# Patient Record
Sex: Male | Born: 1940 | Race: White | Hispanic: No | Smoking: Never smoker
Health system: Southern US, Community
[De-identification: ages and names within clinical notes are randomized; demographics above are authoritative.]

## PROBLEM LIST (undated history)

## (undated) ENCOUNTER — Emergency Department (HOSPITAL_COMMUNITY): Payer: Medicare Other

## (undated) DIAGNOSIS — I4821 Permanent atrial fibrillation: Secondary | ICD-10-CM

## (undated) DIAGNOSIS — R972 Elevated prostate specific antigen [PSA]: Secondary | ICD-10-CM

## (undated) DIAGNOSIS — M1990 Unspecified osteoarthritis, unspecified site: Secondary | ICD-10-CM

## (undated) DIAGNOSIS — C801 Malignant (primary) neoplasm, unspecified: Secondary | ICD-10-CM

## (undated) DIAGNOSIS — Z9889 Other specified postprocedural states: Secondary | ICD-10-CM

## (undated) DIAGNOSIS — K5790 Diverticulosis of intestine, part unspecified, without perforation or abscess without bleeding: Secondary | ICD-10-CM

## (undated) DIAGNOSIS — I10 Essential (primary) hypertension: Secondary | ICD-10-CM

## (undated) DIAGNOSIS — N189 Chronic kidney disease, unspecified: Secondary | ICD-10-CM

## (undated) DIAGNOSIS — R112 Nausea with vomiting, unspecified: Secondary | ICD-10-CM

## (undated) DIAGNOSIS — T8459XA Infection and inflammatory reaction due to other internal joint prosthesis, initial encounter: Secondary | ICD-10-CM

## (undated) DIAGNOSIS — Z96659 Presence of unspecified artificial knee joint: Secondary | ICD-10-CM

## (undated) DIAGNOSIS — Z8719 Personal history of other diseases of the digestive system: Secondary | ICD-10-CM

## (undated) HISTORY — DX: Permanent atrial fibrillation: I48.21

## (undated) HISTORY — DX: Elevated prostate specific antigen (PSA): R97.20

## (undated) HISTORY — DX: Essential (primary) hypertension: I10

## (undated) HISTORY — PX: HIATAL HERNIA REPAIR: SHX195

## (undated) HISTORY — DX: Infection and inflammatory reaction due to other internal joint prosthesis, initial encounter: T84.59XA

## (undated) HISTORY — DX: Diverticulosis of intestine, part unspecified, without perforation or abscess without bleeding: K57.90

## (undated) HISTORY — DX: Malignant (primary) neoplasm, unspecified: C80.1

## (undated) HISTORY — DX: Unspecified osteoarthritis, unspecified site: M19.90

## (undated) HISTORY — DX: Presence of unspecified artificial knee joint: Z96.659

## (undated) HISTORY — PX: HERNIA REPAIR: SHX51

## (undated) HISTORY — DX: Chronic kidney disease, unspecified: N18.9

---

## 2001-04-18 HISTORY — PX: COLONOSCOPY: SHX174

## 2001-12-27 ENCOUNTER — Ambulatory Visit (HOSPITAL_COMMUNITY): Admission: RE | Admit: 2001-12-27 | Discharge: 2001-12-27 | Payer: Self-pay | Admitting: Internal Medicine

## 2001-12-27 ENCOUNTER — Encounter: Payer: Self-pay | Admitting: Internal Medicine

## 2002-01-02 ENCOUNTER — Encounter: Payer: Self-pay | Admitting: Internal Medicine

## 2002-01-02 ENCOUNTER — Ambulatory Visit (HOSPITAL_COMMUNITY): Admission: RE | Admit: 2002-01-02 | Discharge: 2002-01-02 | Payer: Self-pay | Admitting: Internal Medicine

## 2002-01-21 ENCOUNTER — Ambulatory Visit (HOSPITAL_COMMUNITY): Admission: RE | Admit: 2002-01-21 | Discharge: 2002-01-21 | Payer: Self-pay | Admitting: Internal Medicine

## 2002-03-08 ENCOUNTER — Encounter: Payer: Self-pay | Admitting: Surgery

## 2002-03-20 ENCOUNTER — Encounter (INDEPENDENT_AMBULATORY_CARE_PROVIDER_SITE_OTHER): Payer: Self-pay | Admitting: Specialist

## 2002-03-20 ENCOUNTER — Inpatient Hospital Stay (HOSPITAL_COMMUNITY): Admission: RE | Admit: 2002-03-20 | Discharge: 2002-03-29 | Payer: Self-pay | Admitting: Surgery

## 2002-03-21 ENCOUNTER — Encounter: Payer: Self-pay | Admitting: Surgery

## 2002-03-25 ENCOUNTER — Encounter: Payer: Self-pay | Admitting: Cardiology

## 2004-01-23 ENCOUNTER — Encounter: Admission: RE | Admit: 2004-01-23 | Discharge: 2004-01-23 | Payer: Self-pay | Admitting: Orthopaedic Surgery

## 2004-02-09 ENCOUNTER — Encounter: Admission: RE | Admit: 2004-02-09 | Discharge: 2004-02-09 | Payer: Self-pay | Admitting: Orthopaedic Surgery

## 2004-02-23 ENCOUNTER — Encounter: Admission: RE | Admit: 2004-02-23 | Discharge: 2004-02-23 | Payer: Self-pay | Admitting: Orthopaedic Surgery

## 2004-02-23 ENCOUNTER — Ambulatory Visit: Payer: Self-pay | Admitting: Cardiology

## 2004-03-01 ENCOUNTER — Ambulatory Visit: Payer: Self-pay | Admitting: Cardiology

## 2004-03-08 ENCOUNTER — Encounter: Admission: RE | Admit: 2004-03-08 | Discharge: 2004-03-08 | Payer: Self-pay | Admitting: Orthopaedic Surgery

## 2004-03-08 ENCOUNTER — Ambulatory Visit: Payer: Self-pay | Admitting: Internal Medicine

## 2004-03-16 ENCOUNTER — Ambulatory Visit: Payer: Self-pay | Admitting: Cardiology

## 2004-03-31 ENCOUNTER — Ambulatory Visit: Payer: Self-pay | Admitting: Cardiology

## 2004-04-16 ENCOUNTER — Ambulatory Visit: Payer: Self-pay | Admitting: Internal Medicine

## 2004-04-28 ENCOUNTER — Ambulatory Visit: Payer: Self-pay | Admitting: *Deleted

## 2004-05-03 ENCOUNTER — Ambulatory Visit: Payer: Self-pay | Admitting: Internal Medicine

## 2004-05-19 ENCOUNTER — Ambulatory Visit: Payer: Self-pay | Admitting: Cardiology

## 2004-06-16 ENCOUNTER — Ambulatory Visit: Payer: Self-pay | Admitting: Cardiology

## 2004-06-21 ENCOUNTER — Ambulatory Visit: Payer: Self-pay | Admitting: Internal Medicine

## 2004-07-14 ENCOUNTER — Ambulatory Visit: Payer: Self-pay | Admitting: *Deleted

## 2004-08-04 ENCOUNTER — Ambulatory Visit: Payer: Self-pay | Admitting: Internal Medicine

## 2004-09-01 ENCOUNTER — Ambulatory Visit: Payer: Self-pay | Admitting: Cardiology

## 2004-09-15 ENCOUNTER — Ambulatory Visit: Payer: Self-pay | Admitting: Cardiology

## 2004-10-13 ENCOUNTER — Ambulatory Visit: Payer: Self-pay | Admitting: Cardiology

## 2004-10-22 ENCOUNTER — Ambulatory Visit: Payer: Self-pay | Admitting: Cardiology

## 2004-11-10 ENCOUNTER — Ambulatory Visit: Payer: Self-pay | Admitting: Cardiology

## 2004-12-01 ENCOUNTER — Ambulatory Visit: Payer: Self-pay | Admitting: Cardiology

## 2004-12-29 ENCOUNTER — Ambulatory Visit: Payer: Self-pay | Admitting: Cardiology

## 2005-01-26 ENCOUNTER — Ambulatory Visit: Payer: Self-pay | Admitting: Cardiology

## 2005-02-23 ENCOUNTER — Ambulatory Visit: Payer: Self-pay | Admitting: Cardiology

## 2005-03-24 ENCOUNTER — Ambulatory Visit: Payer: Self-pay | Admitting: Cardiology

## 2005-03-31 ENCOUNTER — Ambulatory Visit: Payer: Self-pay | Admitting: Internal Medicine

## 2005-04-18 HISTORY — PX: TOTAL KNEE ARTHROPLASTY: SHX125

## 2005-04-18 HISTORY — PX: TOTAL HIP ARTHROPLASTY: SHX124

## 2005-04-20 ENCOUNTER — Ambulatory Visit: Payer: Self-pay | Admitting: Cardiology

## 2005-05-04 ENCOUNTER — Ambulatory Visit: Payer: Self-pay | Admitting: Cardiology

## 2005-05-16 ENCOUNTER — Ambulatory Visit: Payer: Self-pay | Admitting: Internal Medicine

## 2005-05-20 ENCOUNTER — Encounter: Payer: Self-pay | Admitting: Cardiology

## 2005-05-20 ENCOUNTER — Ambulatory Visit: Payer: Self-pay

## 2005-05-20 ENCOUNTER — Ambulatory Visit: Payer: Self-pay | Admitting: Cardiology

## 2005-05-25 ENCOUNTER — Ambulatory Visit: Payer: Self-pay | Admitting: Cardiology

## 2005-06-02 ENCOUNTER — Ambulatory Visit: Payer: Self-pay | Admitting: Internal Medicine

## 2005-06-03 ENCOUNTER — Ambulatory Visit: Payer: Self-pay | Admitting: Internal Medicine

## 2005-06-20 ENCOUNTER — Ambulatory Visit: Payer: Self-pay | Admitting: Internal Medicine

## 2005-06-22 ENCOUNTER — Ambulatory Visit: Payer: Self-pay | Admitting: Internal Medicine

## 2005-06-24 ENCOUNTER — Ambulatory Visit: Payer: Self-pay | Admitting: Internal Medicine

## 2005-07-20 ENCOUNTER — Ambulatory Visit: Payer: Self-pay | Admitting: Cardiology

## 2005-08-10 ENCOUNTER — Ambulatory Visit: Payer: Self-pay | Admitting: *Deleted

## 2005-09-14 ENCOUNTER — Ambulatory Visit: Payer: Self-pay | Admitting: Cardiovascular Disease

## 2005-09-28 ENCOUNTER — Ambulatory Visit: Payer: Self-pay | Admitting: Cardiovascular Disease

## 2005-10-26 ENCOUNTER — Ambulatory Visit: Payer: Self-pay | Admitting: Internal Medicine

## 2005-11-24 ENCOUNTER — Ambulatory Visit: Payer: Self-pay | Admitting: Cardiology

## 2005-12-15 ENCOUNTER — Ambulatory Visit: Payer: Self-pay | Admitting: Cardiology

## 2006-01-12 ENCOUNTER — Ambulatory Visit: Payer: Self-pay | Admitting: Cardiology

## 2006-02-09 ENCOUNTER — Ambulatory Visit: Payer: Self-pay | Admitting: Cardiology

## 2006-02-23 ENCOUNTER — Ambulatory Visit: Payer: Self-pay | Admitting: Internal Medicine

## 2006-02-23 LAB — CONVERTED CEMR LAB
BUN: 27 mg/dL — ABNORMAL HIGH (ref 6–23)
Creatinine, Ser: 1.6 mg/dL — ABNORMAL HIGH (ref 0.4–1.5)
Potassium: 4.4 meq/L (ref 3.5–5.1)
Uric Acid, Serum: 7.6 mg/dL — ABNORMAL HIGH (ref 2.4–7.0)

## 2006-03-03 ENCOUNTER — Ambulatory Visit: Payer: Self-pay | Admitting: Internal Medicine

## 2006-03-10 ENCOUNTER — Ambulatory Visit: Payer: Self-pay | Admitting: Cardiology

## 2006-04-06 ENCOUNTER — Ambulatory Visit: Payer: Self-pay | Admitting: Cardiology

## 2006-04-27 ENCOUNTER — Ambulatory Visit: Payer: Self-pay | Admitting: Internal Medicine

## 2006-05-01 ENCOUNTER — Ambulatory Visit: Payer: Self-pay | Admitting: Internal Medicine

## 2006-05-01 LAB — CONVERTED CEMR LAB
BUN: 24 mg/dL — ABNORMAL HIGH (ref 6–23)
Creatinine, Ser: 1.5 mg/dL (ref 0.4–1.5)
Potassium: 4.7 meq/L (ref 3.5–5.1)

## 2006-05-04 ENCOUNTER — Ambulatory Visit: Payer: Self-pay | Admitting: *Deleted

## 2006-06-01 ENCOUNTER — Ambulatory Visit: Payer: Self-pay | Admitting: Internal Medicine

## 2006-06-19 ENCOUNTER — Ambulatory Visit: Payer: Self-pay | Admitting: Internal Medicine

## 2006-06-29 ENCOUNTER — Ambulatory Visit: Payer: Self-pay | Admitting: Cardiology

## 2006-08-03 ENCOUNTER — Ambulatory Visit: Payer: Self-pay | Admitting: Internal Medicine

## 2006-08-07 ENCOUNTER — Ambulatory Visit: Payer: Self-pay | Admitting: Internal Medicine

## 2006-08-31 DIAGNOSIS — I4891 Unspecified atrial fibrillation: Secondary | ICD-10-CM | POA: Insufficient documentation

## 2006-08-31 DIAGNOSIS — D539 Nutritional anemia, unspecified: Secondary | ICD-10-CM | POA: Insufficient documentation

## 2006-08-31 DIAGNOSIS — R972 Elevated prostate specific antigen [PSA]: Secondary | ICD-10-CM | POA: Insufficient documentation

## 2006-08-31 DIAGNOSIS — D649 Anemia, unspecified: Secondary | ICD-10-CM

## 2006-09-01 ENCOUNTER — Ambulatory Visit: Payer: Self-pay | Admitting: Cardiology

## 2006-09-04 ENCOUNTER — Encounter: Payer: Self-pay | Admitting: Internal Medicine

## 2006-09-04 ENCOUNTER — Ambulatory Visit: Payer: Self-pay | Admitting: Internal Medicine

## 2006-09-04 DIAGNOSIS — I10 Essential (primary) hypertension: Secondary | ICD-10-CM | POA: Insufficient documentation

## 2006-09-05 ENCOUNTER — Encounter: Payer: Self-pay | Admitting: Internal Medicine

## 2006-09-29 ENCOUNTER — Ambulatory Visit: Payer: Self-pay | Admitting: Cardiovascular Disease

## 2006-10-19 ENCOUNTER — Ambulatory Visit: Payer: Self-pay | Admitting: Internal Medicine

## 2006-11-09 ENCOUNTER — Ambulatory Visit: Payer: Self-pay | Admitting: Cardiology

## 2006-12-07 ENCOUNTER — Ambulatory Visit: Payer: Self-pay | Admitting: Cardiology

## 2007-01-04 ENCOUNTER — Ambulatory Visit: Payer: Self-pay | Admitting: Cardiology

## 2007-01-31 ENCOUNTER — Ambulatory Visit: Payer: Self-pay | Admitting: Cardiovascular Disease

## 2007-02-13 ENCOUNTER — Telehealth (INDEPENDENT_AMBULATORY_CARE_PROVIDER_SITE_OTHER): Payer: Self-pay | Admitting: *Deleted

## 2007-03-01 ENCOUNTER — Ambulatory Visit: Payer: Self-pay | Admitting: Cardiology

## 2007-03-07 ENCOUNTER — Ambulatory Visit: Payer: Self-pay | Admitting: Internal Medicine

## 2007-03-08 ENCOUNTER — Ambulatory Visit: Payer: Self-pay | Admitting: Internal Medicine

## 2007-03-16 ENCOUNTER — Encounter (INDEPENDENT_AMBULATORY_CARE_PROVIDER_SITE_OTHER): Payer: Self-pay | Admitting: *Deleted

## 2007-03-16 ENCOUNTER — Ambulatory Visit: Payer: Self-pay | Admitting: Internal Medicine

## 2007-03-29 ENCOUNTER — Ambulatory Visit: Payer: Self-pay | Admitting: Cardiology

## 2007-04-26 ENCOUNTER — Ambulatory Visit: Payer: Self-pay | Admitting: Internal Medicine

## 2007-05-22 ENCOUNTER — Ambulatory Visit: Payer: Self-pay | Admitting: Internal Medicine

## 2007-05-24 ENCOUNTER — Ambulatory Visit: Payer: Self-pay | Admitting: Cardiology

## 2007-05-28 ENCOUNTER — Encounter: Payer: Self-pay | Admitting: Internal Medicine

## 2007-05-28 ENCOUNTER — Encounter (INDEPENDENT_AMBULATORY_CARE_PROVIDER_SITE_OTHER): Payer: Self-pay | Admitting: *Deleted

## 2007-05-28 LAB — CONVERTED CEMR LAB
BUN: 24 mg/dL — ABNORMAL HIGH (ref 6–23)
Creatinine, Ser: 1.7 mg/dL — ABNORMAL HIGH (ref 0.4–1.5)
Potassium: 3.9 meq/L (ref 3.5–5.1)

## 2007-05-29 ENCOUNTER — Telehealth (INDEPENDENT_AMBULATORY_CARE_PROVIDER_SITE_OTHER): Payer: Self-pay | Admitting: *Deleted

## 2007-06-12 ENCOUNTER — Encounter: Admission: RE | Admit: 2007-06-12 | Discharge: 2007-06-12 | Payer: Self-pay | Admitting: Internal Medicine

## 2007-06-14 ENCOUNTER — Encounter: Payer: Self-pay | Admitting: Internal Medicine

## 2007-06-14 DIAGNOSIS — N281 Cyst of kidney, acquired: Secondary | ICD-10-CM | POA: Insufficient documentation

## 2007-06-15 ENCOUNTER — Encounter (INDEPENDENT_AMBULATORY_CARE_PROVIDER_SITE_OTHER): Payer: Self-pay | Admitting: *Deleted

## 2007-06-21 ENCOUNTER — Ambulatory Visit: Payer: Self-pay | Admitting: Cardiovascular Disease

## 2007-06-27 ENCOUNTER — Encounter: Admission: RE | Admit: 2007-06-27 | Discharge: 2007-06-27 | Payer: Self-pay | Admitting: Internal Medicine

## 2007-06-28 ENCOUNTER — Telehealth (INDEPENDENT_AMBULATORY_CARE_PROVIDER_SITE_OTHER): Payer: Self-pay | Admitting: *Deleted

## 2007-06-28 ENCOUNTER — Ambulatory Visit: Payer: Self-pay | Admitting: Cardiology

## 2007-06-29 ENCOUNTER — Encounter: Payer: Self-pay | Admitting: Internal Medicine

## 2007-07-19 ENCOUNTER — Ambulatory Visit: Payer: Self-pay | Admitting: Internal Medicine

## 2007-07-24 ENCOUNTER — Encounter (INDEPENDENT_AMBULATORY_CARE_PROVIDER_SITE_OTHER): Payer: Self-pay | Admitting: *Deleted

## 2007-07-25 ENCOUNTER — Telehealth: Payer: Self-pay | Admitting: Internal Medicine

## 2007-07-26 ENCOUNTER — Encounter (INDEPENDENT_AMBULATORY_CARE_PROVIDER_SITE_OTHER): Payer: Self-pay | Admitting: *Deleted

## 2007-08-13 ENCOUNTER — Telehealth (INDEPENDENT_AMBULATORY_CARE_PROVIDER_SITE_OTHER): Payer: Self-pay | Admitting: *Deleted

## 2007-08-15 ENCOUNTER — Encounter: Payer: Self-pay | Admitting: Internal Medicine

## 2007-08-16 ENCOUNTER — Ambulatory Visit: Payer: Self-pay | Admitting: Cardiology

## 2007-08-24 ENCOUNTER — Telehealth: Payer: Self-pay | Admitting: Internal Medicine

## 2007-09-13 ENCOUNTER — Ambulatory Visit: Payer: Self-pay | Admitting: Cardiology

## 2007-10-11 ENCOUNTER — Ambulatory Visit: Payer: Self-pay | Admitting: Cardiology

## 2007-11-08 ENCOUNTER — Ambulatory Visit: Payer: Self-pay | Admitting: Internal Medicine

## 2007-11-09 ENCOUNTER — Encounter (INDEPENDENT_AMBULATORY_CARE_PROVIDER_SITE_OTHER): Payer: Self-pay | Admitting: *Deleted

## 2007-11-23 ENCOUNTER — Encounter: Payer: Self-pay | Admitting: Internal Medicine

## 2007-12-03 ENCOUNTER — Ambulatory Visit: Payer: Self-pay | Admitting: Internal Medicine

## 2007-12-03 ENCOUNTER — Encounter (INDEPENDENT_AMBULATORY_CARE_PROVIDER_SITE_OTHER): Payer: Self-pay | Admitting: *Deleted

## 2007-12-06 ENCOUNTER — Ambulatory Visit: Payer: Self-pay | Admitting: Internal Medicine

## 2008-01-03 ENCOUNTER — Ambulatory Visit: Payer: Self-pay | Admitting: Cardiology

## 2008-02-01 ENCOUNTER — Ambulatory Visit: Payer: Self-pay | Admitting: Cardiology

## 2008-02-28 ENCOUNTER — Ambulatory Visit: Payer: Self-pay | Admitting: Cardiology

## 2008-03-17 ENCOUNTER — Encounter: Payer: Self-pay | Admitting: Internal Medicine

## 2008-03-19 ENCOUNTER — Encounter: Payer: Self-pay | Admitting: Internal Medicine

## 2008-03-27 ENCOUNTER — Ambulatory Visit: Payer: Self-pay | Admitting: Cardiology

## 2008-04-24 ENCOUNTER — Ambulatory Visit: Payer: Self-pay | Admitting: Cardiovascular Disease

## 2008-05-22 ENCOUNTER — Ambulatory Visit: Payer: Self-pay | Admitting: Internal Medicine

## 2008-06-19 ENCOUNTER — Ambulatory Visit: Payer: Self-pay | Admitting: Cardiovascular Disease

## 2008-07-22 ENCOUNTER — Ambulatory Visit: Payer: Self-pay | Admitting: Cardiovascular Disease

## 2008-08-21 ENCOUNTER — Ambulatory Visit: Payer: Self-pay | Admitting: Internal Medicine

## 2008-09-16 ENCOUNTER — Encounter: Payer: Self-pay | Admitting: *Deleted

## 2008-09-18 ENCOUNTER — Encounter: Payer: Self-pay | Admitting: Internal Medicine

## 2008-09-18 ENCOUNTER — Ambulatory Visit: Payer: Self-pay | Admitting: Internal Medicine

## 2008-09-18 LAB — CONVERTED CEMR LAB
POC INR: 2.1
Protime: 18

## 2008-10-17 ENCOUNTER — Ambulatory Visit: Payer: Self-pay | Admitting: Cardiovascular Disease

## 2008-10-17 LAB — CONVERTED CEMR LAB
POC INR: 3.6
Prothrombin Time: 23.1 s

## 2008-10-21 ENCOUNTER — Ambulatory Visit: Payer: Self-pay | Admitting: Internal Medicine

## 2008-10-22 ENCOUNTER — Encounter: Payer: Self-pay | Admitting: *Deleted

## 2008-10-28 ENCOUNTER — Encounter (INDEPENDENT_AMBULATORY_CARE_PROVIDER_SITE_OTHER): Payer: Self-pay | Admitting: *Deleted

## 2008-10-28 LAB — CONVERTED CEMR LAB
BUN: 23 mg/dL (ref 6–23)
Creatinine, Ser: 1.5 mg/dL (ref 0.4–1.5)
Potassium: 3.8 meq/L (ref 3.5–5.1)
TSH: 1.59 microintl units/mL (ref 0.35–5.50)

## 2008-11-07 ENCOUNTER — Ambulatory Visit: Payer: Self-pay | Admitting: Internal Medicine

## 2008-11-07 LAB — CONVERTED CEMR LAB
POC INR: 3.3
Prothrombin Time: 22 s

## 2008-11-28 ENCOUNTER — Ambulatory Visit: Payer: Self-pay | Admitting: Internal Medicine

## 2008-11-28 ENCOUNTER — Encounter (INDEPENDENT_AMBULATORY_CARE_PROVIDER_SITE_OTHER): Payer: Self-pay | Admitting: Cardiology

## 2008-11-28 LAB — CONVERTED CEMR LAB: POC INR: 2.4

## 2008-12-25 ENCOUNTER — Encounter: Payer: Self-pay | Admitting: Internal Medicine

## 2008-12-26 ENCOUNTER — Ambulatory Visit: Payer: Self-pay | Admitting: Internal Medicine

## 2008-12-26 LAB — CONVERTED CEMR LAB: POC INR: 2.3

## 2009-01-23 ENCOUNTER — Ambulatory Visit: Payer: Self-pay | Admitting: Internal Medicine

## 2009-01-23 LAB — CONVERTED CEMR LAB: POC INR: 2.7

## 2009-02-20 ENCOUNTER — Ambulatory Visit: Payer: Self-pay | Admitting: Cardiovascular Disease

## 2009-02-20 LAB — CONVERTED CEMR LAB: POC INR: 2.8

## 2009-03-11 ENCOUNTER — Telehealth (INDEPENDENT_AMBULATORY_CARE_PROVIDER_SITE_OTHER): Payer: Self-pay | Admitting: *Deleted

## 2009-03-18 ENCOUNTER — Ambulatory Visit: Payer: Self-pay | Admitting: Internal Medicine

## 2009-03-18 DIAGNOSIS — N1832 Chronic kidney disease, stage 3b: Secondary | ICD-10-CM | POA: Insufficient documentation

## 2009-03-18 DIAGNOSIS — N183 Chronic kidney disease, stage 3 unspecified: Secondary | ICD-10-CM | POA: Insufficient documentation

## 2009-03-18 DIAGNOSIS — M1990 Unspecified osteoarthritis, unspecified site: Secondary | ICD-10-CM | POA: Insufficient documentation

## 2009-03-20 ENCOUNTER — Ambulatory Visit: Payer: Self-pay | Admitting: Cardiovascular Disease

## 2009-03-20 ENCOUNTER — Encounter (INDEPENDENT_AMBULATORY_CARE_PROVIDER_SITE_OTHER): Payer: Self-pay | Admitting: *Deleted

## 2009-03-20 LAB — CONVERTED CEMR LAB: POC INR: 3.3

## 2009-03-23 ENCOUNTER — Encounter (INDEPENDENT_AMBULATORY_CARE_PROVIDER_SITE_OTHER): Payer: Self-pay | Admitting: *Deleted

## 2009-03-23 ENCOUNTER — Encounter: Payer: Self-pay | Admitting: Internal Medicine

## 2009-03-25 ENCOUNTER — Ambulatory Visit: Payer: Self-pay | Admitting: Internal Medicine

## 2009-03-26 ENCOUNTER — Telehealth (INDEPENDENT_AMBULATORY_CARE_PROVIDER_SITE_OTHER): Payer: Self-pay

## 2009-03-30 ENCOUNTER — Encounter (HOSPITAL_COMMUNITY): Admission: RE | Admit: 2009-03-30 | Discharge: 2009-04-16 | Payer: Self-pay | Admitting: Internal Medicine

## 2009-03-30 ENCOUNTER — Ambulatory Visit: Payer: Self-pay

## 2009-03-30 ENCOUNTER — Ambulatory Visit: Payer: Self-pay | Admitting: Cardiology

## 2009-04-06 ENCOUNTER — Telehealth: Payer: Self-pay | Admitting: Internal Medicine

## 2009-04-13 ENCOUNTER — Telehealth: Payer: Self-pay | Admitting: Internal Medicine

## 2009-04-16 ENCOUNTER — Ambulatory Visit: Payer: Self-pay | Admitting: Internal Medicine

## 2009-04-16 LAB — CONVERTED CEMR LAB: POC INR: 1.8

## 2009-05-12 ENCOUNTER — Inpatient Hospital Stay (HOSPITAL_COMMUNITY): Admission: RE | Admit: 2009-05-12 | Discharge: 2009-05-18 | Payer: Self-pay | Admitting: Orthopaedic Surgery

## 2009-05-12 ENCOUNTER — Ambulatory Visit: Payer: Self-pay | Admitting: Cardiology

## 2009-05-13 ENCOUNTER — Encounter: Payer: Self-pay | Admitting: Cardiology

## 2009-05-18 ENCOUNTER — Telehealth: Payer: Self-pay | Admitting: Internal Medicine

## 2009-06-05 ENCOUNTER — Encounter: Payer: Self-pay | Admitting: Internal Medicine

## 2009-06-10 ENCOUNTER — Ambulatory Visit: Payer: Self-pay | Admitting: Internal Medicine

## 2009-06-10 LAB — CONVERTED CEMR LAB: POC INR: 2.1

## 2009-06-24 ENCOUNTER — Ambulatory Visit: Payer: Self-pay | Admitting: Cardiovascular Disease

## 2009-07-22 ENCOUNTER — Ambulatory Visit: Payer: Self-pay | Admitting: Cardiology

## 2009-07-22 LAB — CONVERTED CEMR LAB: POC INR: 2.1

## 2009-08-19 ENCOUNTER — Ambulatory Visit: Payer: Self-pay | Admitting: Cardiology

## 2009-08-19 LAB — CONVERTED CEMR LAB: POC INR: 2.4

## 2009-09-01 ENCOUNTER — Telehealth: Payer: Self-pay | Admitting: Internal Medicine

## 2009-09-09 ENCOUNTER — Ambulatory Visit: Payer: Self-pay

## 2009-09-09 ENCOUNTER — Ambulatory Visit: Payer: Self-pay | Admitting: Internal Medicine

## 2009-09-09 LAB — CONVERTED CEMR LAB: POC INR: 2.1

## 2009-09-11 ENCOUNTER — Telehealth: Payer: Self-pay | Admitting: Internal Medicine

## 2009-09-11 ENCOUNTER — Telehealth (INDEPENDENT_AMBULATORY_CARE_PROVIDER_SITE_OTHER): Payer: Self-pay | Admitting: *Deleted

## 2009-09-23 ENCOUNTER — Encounter: Payer: Self-pay | Admitting: Internal Medicine

## 2009-09-29 ENCOUNTER — Telehealth: Payer: Self-pay | Admitting: Internal Medicine

## 2009-10-07 ENCOUNTER — Ambulatory Visit: Payer: Self-pay | Admitting: Cardiovascular Disease

## 2009-10-07 LAB — CONVERTED CEMR LAB: POC INR: 2.2

## 2009-11-04 ENCOUNTER — Ambulatory Visit: Payer: Self-pay | Admitting: Cardiology

## 2009-11-04 LAB — CONVERTED CEMR LAB: POC INR: 2.3

## 2009-12-04 ENCOUNTER — Ambulatory Visit: Payer: Self-pay | Admitting: Cardiology

## 2009-12-04 LAB — CONVERTED CEMR LAB: POC INR: 2.3

## 2009-12-15 ENCOUNTER — Telehealth: Payer: Self-pay | Admitting: Internal Medicine

## 2009-12-23 ENCOUNTER — Ambulatory Visit: Payer: Self-pay | Admitting: Internal Medicine

## 2010-01-01 ENCOUNTER — Ambulatory Visit: Payer: Self-pay | Admitting: Cardiology

## 2010-01-01 LAB — CONVERTED CEMR LAB: POC INR: 3.3

## 2010-01-11 ENCOUNTER — Encounter: Payer: Self-pay | Admitting: Internal Medicine

## 2010-01-29 ENCOUNTER — Ambulatory Visit: Payer: Self-pay | Admitting: Cardiology

## 2010-01-29 LAB — CONVERTED CEMR LAB: POC INR: 3.6

## 2010-02-11 ENCOUNTER — Ambulatory Visit (HOSPITAL_COMMUNITY): Admission: RE | Admit: 2010-02-11 | Discharge: 2010-02-12 | Payer: Self-pay | Admitting: Orthopaedic Surgery

## 2010-02-16 ENCOUNTER — Ambulatory Visit: Payer: Self-pay | Admitting: Infectious Disease

## 2010-02-16 ENCOUNTER — Inpatient Hospital Stay (HOSPITAL_COMMUNITY): Admission: RE | Admit: 2010-02-16 | Discharge: 2010-02-19 | Payer: Self-pay | Admitting: Orthopaedic Surgery

## 2010-02-24 ENCOUNTER — Encounter: Payer: Self-pay | Admitting: Infectious Disease

## 2010-03-10 ENCOUNTER — Encounter: Payer: Self-pay | Admitting: Infectious Disease

## 2010-03-15 ENCOUNTER — Encounter: Payer: Self-pay | Admitting: Infectious Disease

## 2010-03-22 ENCOUNTER — Encounter: Payer: Self-pay | Admitting: Infectious Disease

## 2010-03-25 ENCOUNTER — Encounter: Payer: Self-pay | Admitting: Internal Medicine

## 2010-03-25 LAB — CONVERTED CEMR LAB: POC INR: 1.8

## 2010-03-29 ENCOUNTER — Ambulatory Visit: Payer: Self-pay | Admitting: Infectious Disease

## 2010-03-29 ENCOUNTER — Telehealth: Payer: Self-pay | Admitting: Infectious Disease

## 2010-03-29 ENCOUNTER — Encounter: Payer: Self-pay | Admitting: Infectious Disease

## 2010-03-29 ENCOUNTER — Encounter: Payer: Self-pay | Admitting: Internal Medicine

## 2010-03-29 DIAGNOSIS — T8450XA Infection and inflammatory reaction due to unspecified internal joint prosthesis, initial encounter: Secondary | ICD-10-CM | POA: Insufficient documentation

## 2010-03-29 DIAGNOSIS — B951 Streptococcus, group B, as the cause of diseases classified elsewhere: Secondary | ICD-10-CM | POA: Insufficient documentation

## 2010-03-29 LAB — CONVERTED CEMR LAB
BUN: 23 mg/dL (ref 6–23)
Basophils Absolute: 0 10*3/uL (ref 0.0–0.1)
Basophils Relative: 0 % (ref 0–1)
CO2: 26 meq/L (ref 19–32)
CRP: 0.6 mg/dL — ABNORMAL HIGH (ref <0.6)
Calcium: 9 mg/dL (ref 8.4–10.5)
Chloride: 105 meq/L (ref 96–112)
Creatinine, Ser: 0.97 mg/dL (ref 0.40–1.50)
Eosinophils Absolute: 0.3 10*3/uL (ref 0.0–0.7)
Eosinophils Relative: 3 % (ref 0–5)
Glucose, Bld: 76 mg/dL (ref 70–99)
HCT: 44.4 % (ref 39.0–52.0)
Hemoglobin: 13.7 g/dL (ref 13.0–17.0)
Lymphocytes Relative: 14 % (ref 12–46)
Lymphs Abs: 1.2 10*3/uL (ref 0.7–4.0)
MCHC: 30.9 g/dL (ref 30.0–36.0)
MCV: 96.1 fL (ref 78.0–100.0)
Monocytes Absolute: 0.9 10*3/uL (ref 0.1–1.0)
Monocytes Relative: 10 % (ref 3–12)
Neutro Abs: 6.5 10*3/uL (ref 1.7–7.7)
Neutrophils Relative %: 73 % (ref 43–77)
Platelets: 304 10*3/uL (ref 150–400)
Potassium: 4 meq/L (ref 3.5–5.3)
RBC: 4.62 M/uL (ref 4.22–5.81)
RDW: 16.6 % — ABNORMAL HIGH (ref 11.5–15.5)
Sed Rate: 13 mm/hr (ref 0–16)
Sodium: 142 meq/L (ref 135–145)
WBC: 8.9 10*3/uL (ref 4.0–10.5)

## 2010-04-09 ENCOUNTER — Ambulatory Visit: Payer: Self-pay | Admitting: Cardiology

## 2010-04-09 LAB — CONVERTED CEMR LAB: POC INR: 2.9

## 2010-05-07 ENCOUNTER — Ambulatory Visit
Admission: RE | Admit: 2010-05-07 | Discharge: 2010-05-07 | Payer: Self-pay | Source: Home / Self Care | Attending: Cardiology | Admitting: Cardiology

## 2010-05-07 LAB — CONVERTED CEMR LAB: POC INR: 3.2

## 2010-05-16 LAB — CONVERTED CEMR LAB
BUN: 32 mg/dL — ABNORMAL HIGH (ref 6–23)
BUN: 36 mg/dL — ABNORMAL HIGH (ref 6–23)
CO2: 30 meq/L (ref 19–32)
Calcium: 9.3 mg/dL (ref 8.4–10.5)
Chloride: 106 meq/L (ref 96–112)
Creatinine, Ser: 1.5 mg/dL (ref 0.4–1.5)
Creatinine, Ser: 1.6 mg/dL — ABNORMAL HIGH (ref 0.4–1.5)
GFR calc non Af Amer: 45.5 mL/min (ref >60)
Glucose, Bld: 92 mg/dL (ref 70–99)
Potassium: 3.7 meq/L (ref 3.5–5.1)
Potassium: 4.2 meq/L (ref 3.5–5.1)
Sodium: 143 meq/L (ref 135–145)

## 2010-05-20 NOTE — Letter (Signed)
Summary: VENOTRAIN  FORM  VENOTRAIN  FORM   Imported By: Job Founds 10/03/2006 10:27:52  _____________________________________________________________________  External Attachment:    Type:   Image     Comment:   External Document

## 2010-05-20 NOTE — Progress Notes (Signed)
Summary: ? If cleared for Surgery  Phone Note Call from Patient Call back at Home Phone [phone]   Caller: Patient Summary of Call: Message left on voicemail: patient would like to know if he is cleared for surgery. Patient wasnt given a definite answer when he was here.   Dr.Hopper please advise./[facility]  April 06, 2009 2:10 PM   Follow-up for Phone Call        from my standpoint he is cleared but Cardiology was doing additional studies & will have to give written clearance Follow-up by: Marga Melnick MD,  April 06, 2009 3:31 PM  Additional Follow-up for Phone Call Additional follow up Details #1::        pt aware.................Marland KitchenFelecia Deloach CMA  April 06, 2009 3:43 PM      Appended Document: ? If cleared for Surgery Proceed to surgery, which is a low risk procedure. He appears to have a stable scar, but no evidence of ischemia.  He has no symptoms of ischemia presently.

## 2010-05-20 NOTE — Medication Information (Signed)
Summary: rov/cb  Anticoagulant Therapy  Managed by: Weston Brass, PharmD Referring MD: Hillis Range PCP: Marga Melnick MD Supervising MD: Shirlee Latch MD, Nile Prisk Indication 1: Atrial Fibrillation (ICD-427.31) Lab Used: [facility]  Site: [name] INR POC 2.3 INR RANGE 2 - 3  Dietary changes: no    Health status changes: no    Bleeding/hemorrhagic complications: no    Recent/future hospitalizations: no    Any changes in medication regimen? no    Recent/future dental: no  Any missed doses?: no       Is patient compliant with meds? yes       Allergies: No Known Drug Allergies  Anticoagulation Management History:      The patient is taking warfarin and comes in today for a routine follow up visit.  Positive risk factors for bleeding include an age of 72 years or older and presence of serious comorbidities.  The bleeding index is 'intermediate risk'.  Positive CHADS2 values include History of HTN.  Negative CHADS2 values include Age > 72 years old.  The start date was 05/05/2000.  Anticoagulation responsible provider: Shirlee Latch MD, Shavona Gunderman.  INR POC: 2.3.  Cuvette Lot#: [id number].  Exp: 01/2011.    Anticoagulation Management Assessment/Plan:      The patient's current anticoagulation dose is Coumadin 5 mg tabs: Take as directed by [facility]..  The target INR is 2 - 3.  The next INR is due 12/02/2009.  Anticoagulation instructions were given to patient.  Results were reviewed/authorized by Weston Brass, PharmD.  He was notified by Weston Brass PharmD.         Prior Anticoagulation Instructions: INR 2.2. Take 1 tablet daily except 0.5 tablet on Mon and Fri. Recheck in 4 weeks.  Current Anticoagulation Instructions: INR 2.3  Continue same dose of 1 tablet every day except 1/2 tablet on Monday and Friday

## 2010-05-20 NOTE — Letter (Signed)
Summary: Primary Care Consult Scheduled Letter  [facility] [place]  [address] [place], [place] [address]   Phone: [phone]  Fax: [phone]      07/24/2009 MRN: [id number]  BRODEN HOLT [address] [place], [place]  [address]    Dear Mr. Pusey,      We have scheduled an appointment for you.  At the recommendation of Dr.HOPPER, we have scheduled you a consult with DR GOLDSBOROUGH [facility] [address] on 05.05.09 @ 11:00 CK IN 10:30.  Their phone number is [phone].  If this appointment day and time is not convenient for you, please feel free to call the office of the doctor you are being referred to at the number listed above and reschedule the appointment.     It is important for you to keep your scheduled appointments. We are here to make sure you are given good patient care. If yu have questions or you have made changes to your appointment, please notify us at  [phone], ask for [facility].    Thank you,  Patient Care Coordinator  at [facility]

## 2010-05-20 NOTE — Letter (Signed)
Summary: [facility]  [facility]   Imported By: Lanelle Bal 04/02/2008 11:40:36  _____________________________________________________________________  External Attachment:    Type:   Image     Comment:   External Document

## 2010-05-20 NOTE — Progress Notes (Signed)
Summary: pt needs meds and having trouble with pharm   Phone Note Call from Patient Call back at Home Phone [phone]   Caller: Patient Reason for Call: Talk to Nurse, Talk to Doctor Summary of Call: pt is out of his furosemide 40mg  and when he went to Pharm he was told he already picked it up but pt said he did not so he needs to talk to someone because he is out of meds Initial call taken by: Omer Jack,  Sep 11, 2009 12:18 PM  Follow-up for Phone Call        Mr. Borel has been unable to refill his furosemide b/c [facility] states he had it filled on 3/18.  He says he only has 2 pills left and did not have it filled on 3/18--90 day supply.  He will contact his insurance company and call us back. Lisabeth Devoid RN Pt calling to let nurse know that insurance has got his medication done Judie Grieve  Sep 11, 2009 1:40 PM     Appended Document: pt needs meds and having trouble with [facility],  Do we need to follow-up on this?  Appended Document: pt needs meds and having trouble with pharm its done

## 2010-05-20 NOTE — Miscellaneous (Addendum)
Summary: HIPAA Restrictions  HIPAA Restrictions   Imported By: Florinda Marker 03/30/2010 09:55:06  _____________________________________________________________________  External Attachment:    Type:   Image     Comment:   External Document

## 2010-05-20 NOTE — Medication Information (Signed)
Summary: rov/jnh  Anticoagulant Therapy  Managed by: Weston Brass, PharmD Referring MD: Marga Melnick MD Supervising MD: Eden Emms MD, Theron Arista Indication 1: Atrial Fibrillation (ICD-427.31) Lab Used: [facility] Site: [name] INR POC 2.8 INR RANGE 2 - 3  Dietary changes: no    Health status changes: no    Bleeding/hemorrhagic complications: no    Recent/future hospitalizations: no    Any changes in medication regimen? no    Recent/future dental: no  Any missed doses?: no       Is patient compliant with meds? yes       Allergies (verified): No Known Drug Allergies  Anticoagulation Management History:      The patient is taking warfarin and comes in today for a routine follow up visit.  Positive risk factors for bleeding include an age of 72 years or older and presence of serious comorbidities.  The bleeding index is 'intermediate risk'.  Positive CHADS2 values include History of HTN.  Negative CHADS2 values include Age > 20 years old.  The start date was 05/05/2000.  Anticoagulation responsible provider: Eden Emms MD, Theron Arista.  INR POC: 2.8.  Cuvette Lot#: [id number].  Exp: 01/2010.    Anticoagulation Management Assessment/Plan:      The patient's current anticoagulation dose is Warfarin sodium 5 mg tabs: Take as directed by coumadin clinic..  The target INR is 2 - 3.  The next INR is due 03/20/2009.  Anticoagulation instructions were given to patient.  Results were reviewed/authorized by Weston Brass, PharmD.  He was notified by Baron Sane, PharmD Candidate.         Prior Anticoagulation Instructions: INR 2.7  Continue normal dosing schedule of 1/2 tablet on Mondays and Fridays, and 1 tablet on all other days.  Return to clinic in 4 weeks.  Current Anticoagulation Instructions: INR 2.8  Continue to take 1 tablet every day except on Monday and Friday take 1/2 tablet. Recheck INR in 4 weeks.

## 2010-05-20 NOTE — Medication Information (Signed)
Summary: rov/eac  Anticoagulant Therapy  Managed by: Bethena Midget, RN, BSN Referring MD: Hillis Range PCP: Marga Melnick MD Supervising MD: Riley Kill MD, Maisie Fus Indication 1: Atrial Fibrillation (ICD-427.31) Lab Used: [facility] Site: [name] INR POC 2.1 INR RANGE 2 - 3  Dietary changes: no    Health status changes: no    Bleeding/hemorrhagic complications: no    Recent/future hospitalizations: no    Any changes in medication regimen? no    Recent/future dental: no  Any missed doses?: no       Is patient compliant with meds? yes       Allergies: No Known Drug Allergies  Anticoagulation Management History:      The patient is taking warfarin and comes in today for a routine follow up visit.  Positive risk factors for bleeding include an age of 65 years or older and presence of serious comorbidities.  The bleeding index is 'intermediate risk'.  Positive CHADS2 values include History of HTN.  Negative CHADS2 values include Age > 22 years old.  The start date was 05/05/2000.  Anticoagulation responsible provider: Riley Kill MD, Maisie Fus.  INR POC: 2.1.  Cuvette Lot#: [id number].  Exp: 08/2010.    Anticoagulation Management Assessment/Plan:      The patient's current anticoagulation dose is Warfarin sodium 5 mg tabs: Take as directed by coumadin clinic..  The target INR is 2 - 3.  The next INR is due 08/19/2009.  Anticoagulation instructions were given to patient.  Results were reviewed/authorized by Bethena Midget, RN, BSN.  He was notified by Bethena Midget, RN, BSN.         Prior Anticoagulation Instructions: INR 2.1  Continue current dosing schedule of 1/2 tablet on Monday and Friday and take 1 tablet all other days.  Return to clinic in 4 weeks.    Current Anticoagulation Instructions: INR 2.1 Continue 1 tablet everyday except 1/2 tablet on Mondays and Fridays. Recheck in 4 weeks.

## 2010-05-20 NOTE — Progress Notes (Signed)
Summary: question regarding medication -linsinopril   Phone Note Call from Patient Call back at Home Phone [phone] Call back at [phone]   Caller: Spouse Reason for Call: Talk to Nurse Details for Reason: pt d/c from [facility] today, has question regarding medication -lisinopril  Initial call taken by: Lorne Skeens,  May 18, 2009 4:59 PM  Follow-up for Phone Call        pt to take 1/2 of a 40mg  tablet at bedtime wife aware Dennis Bast, RN, BSN  May 18, 2009 5:44 PM

## 2010-05-20 NOTE — Medication Information (Signed)
Summary: rov/sp  Anticoagulant Therapy  Managed by: Eda Keys, PharmD Referring MD: Hillis Range PCP: Marga Melnick MD Supervising MD: Johney Frame MD, Fayrene Fearing Indication 1: Atrial Fibrillation (ICD-427.31) Lab Used: [facility] Site: [name] INR POC 2.1 INR RANGE 2 - 3  Dietary changes: no    Health status changes: no    Bleeding/hemorrhagic complications: no    Recent/future hospitalizations: no    Any changes in medication regimen? yes       Details: Possiblity of patient changing to pradaxa  Recent/future dental: no  Any missed doses?: no       Is patient compliant with meds? yes      Comments: Pt possibly changing to pradaxa, but will contact insurance to verify cost before making final decision.    Allergies: No Known Drug Allergies  Anticoagulation Management History:      The patient is taking warfarin and comes in today for a routine follow up visit.  Positive risk factors for bleeding include an age of 88 years or older and presence of serious comorbidities.  The bleeding index is 'intermediate risk'.  Positive CHADS2 values include History of HTN.  Negative CHADS2 values include Age > 78 years old.  The start date was 05/05/2000.  Anticoagulation responsible provider: Trayonna Bachmeier MD, Fayrene Fearing.  INR POC: 2.1.  Cuvette Lot#: [id number].  Exp: 11/2010.    Anticoagulation Management Assessment/Plan:      The target INR is 2 - 3.  The next INR is due 10/07/2009.  Anticoagulation instructions were given to patient.  Results were reviewed/authorized by Eda Keys, PharmD.  He was notified by Eda Keys.         Prior Anticoagulation Instructions: INR 2.4  Continue same dose of 1 tablet every day except 1/2 tablet on Monday and Friday   Current Anticoagulation Instructions: INR 2.1  Continue taking 1/2 tablet on Monday and Friday and 1 tablet all other days.  Call clinic when you have decided whether or not to change to Pradaxa.  At this time we will give you  further instructions.  We will also make an appt at that time.

## 2010-05-20 NOTE — Medication Information (Signed)
Summary: 4 wk return ccr / akw  Anticoagulant Therapy  Managed by: Bethena Midget, RN, BSN Supervising MD: Johney Frame MD, Ardelle Anton Site: [address] PT 18.0 INR POC 2.1  Dietary changes: no    Health status changes: no    Bleeding/hemorrhagic complications: no    Recent/future hospitalizations: no    Any changes in medication regimen? no    Recent/future dental: no  Any missed doses?: no       Is patient compliant with meds? yes       Current Medications (verified): 1)  Lisinopril 40 Mg Tabs (Lisinopril) .... Take One Tablet Daily 2)  Warfarin Sodium 5 Mg Tabs (Warfarin Sodium) 3)  Cozaar 100 Mg  Tabs (Losartan Potassium) .... Take One Tablet Daily 4)  Diltiazem Hcl 90 Mg  Tabs (Diltiazem Hcl) .Marland Kitchen.. 1 By Mouth Bid 5)  Labetalol Hcl 300 Mg  Tabs (Labetalol Hcl) .Marland Kitchen.. 1 Two Times A Day** Pt Due For Office Visit** 6)  Furosemide 40 Mg Tabs (Furosemide) .... Take Two Tablet By Mouth Daily.  Allergies (verified): No Known Drug Allergies  Anticoagulation Management History:      The patient is on coumadin and comes in today for a routine follow up visit.  Positive risk factors for bleeding include an age of 20 years or older and presence of serious comorbidities.  The bleeding index is 'intermediate risk'.  Positive CHADS2 values include History of HTN.  Negative CHADS2 values include Age > 61 years old.    Anticoagulation Management Assessment/Plan:      The patient's current anticoagulation dose is Warfarin sodium 5 mg tabs: Marland Kitchen  He is to have a 10/16/2008.  Anticoagulation instructions were given to patient.  Results were reviewed/authorized by Bethena Midget, RN, BSN.  He was notified by Bethena Midget, RN, BSN.

## 2010-05-20 NOTE — Medication Information (Signed)
Summary: rovmp  Anticoagulant Therapy  Managed by: Eda Keys, PharmD Referring MD: Marga Melnick MD Supervising MD: Gala Romney MD, Reuel Boom Indication 1: Atrial Fibrillation (ICD-427.31) Lab Used: [facility] Site: [name] INR POC 2.3 INR RANGE 2 - 3  Dietary changes: no    Health status changes: no    Bleeding/hemorrhagic complications: no    Recent/future hospitalizations: no    Any changes in medication regimen? no    Recent/future dental: no  Any missed doses?: no       Is patient compliant with meds? yes       Current Medications (verified): 1)  Lisinopril 40 Mg Tabs (Lisinopril) .Marland Kitchen.. 1 & 1/2 By Mouth Once Daily 2)  Warfarin Sodium 5 Mg Tabs (Warfarin Sodium) .... Take As Directed By Coumadin Clinic. 3)  Cozaar 100 Mg  Tabs (Losartan Potassium) .... Take One Tablet Daily**office Visit Due Now** 4)  Diltiazem Hcl 90 Mg  Tabs (Diltiazem Hcl) .Marland Kitchen.. 1 By Mouth Bid 5)  Labetalol Hcl 300 Mg  Tabs (Labetalol Hcl) .Marland Kitchen.. 1 Two Times A Day** Pt Due For Office Visit** 6)  Furosemide 40 Mg Tabs (Furosemide) .... Take Two Tablet By Mouth Daily.  Allergies (verified): No Known Drug Allergies  Anticoagulation Management History:      The patient is taking warfarin and comes in today for a routine follow up visit.  Positive risk factors for bleeding include an age of 72 years or older and presence of serious comorbidities.  The bleeding index is 'intermediate risk'.  Positive CHADS2 values include History of HTN.  Negative CHADS2 values include Age > 43 years old.  The start date was 05/05/2000.  Anticoagulation responsible provider: Bensimhon MD, Reuel Boom.  INR POC: 2.3.  Cuvette Lot#: [id number].  Exp: 01/2010.    Anticoagulation Management Assessment/Plan:      The patient's current anticoagulation dose is Warfarin sodium 5 mg tabs: Take as directed by coumadin clinic..  The target INR is 2 - 3.  The next INR is due 01/23/2009.  Anticoagulation instructions were given to  patient.  Results were reviewed/authorized by Eda Keys, PharmD.  He was notified by Demetria Pore, PharmD candidate.         Prior Anticoagulation Instructions: INR 2.4  Continue 1 tab daily except 0.5 tabs on Mondays and Fridays.    Current Anticoagulation Instructions: INR 2.3   Continue same dosing schedule below.

## 2010-05-20 NOTE — Progress Notes (Signed)
Summary: STRESS TEST RESULTS   Phone Note Call from Patient Call back at Home Phone [phone]   Caller: Patient Summary of Call: TEST RESULTS Initial call taken by: Judie Grieve,  April 13, 2009 11:44 AM  Follow-up for Phone Call        pt aware of results.  Faxed over clearance to Dr Hassie Bruce, RN, BSN  April 13, 2009 11:52 AM

## 2010-05-20 NOTE — Medication Information (Signed)
Summary: rov/jk  Anticoagulant Therapy  Managed by: Weston Brass, PharmD Referring MD: Hillis Range PCP: Marga Melnick MD Supervising MD: Myrtis Ser MD, Tinnie Gens Indication 1: Atrial Fibrillation (ICD-427.31) Lab Used: [facility] Site: [name] INR POC 3.3 INR RANGE 2 - 3  Dietary changes: no    Health status changes: no    Bleeding/hemorrhagic complications: no    Recent/future hospitalizations: no    Any changes in medication regimen? no    Recent/future dental: no  Any missed doses?: no       Is patient compliant with meds? yes       Allergies: No Known Drug Allergies  Anticoagulation Management History:      The patient is taking warfarin and comes in today for a routine follow up visit.  Positive risk factors for bleeding include an age of 30 years or older and presence of serious comorbidities.  The bleeding index is 'intermediate risk'.  Positive CHADS2 values include History of HTN.  Negative CHADS2 values include Age > 42 years old.  The start date was 05/05/2000.  Anticoagulation responsible Edwards Mckelvie: Myrtis Ser MD, Tinnie Gens.  INR POC: 3.3.  Cuvette Lot#: [id number].  Exp: 01/2011.    Anticoagulation Management Assessment/Plan:      The patient's current anticoagulation dose is Coumadin 5 mg tabs: Take as directed by [facility]madin Clinic..  The target INR is 2 - 3.  The next INR is due 01/22/2010.  Anticoagulation instructions were given to patient.  Results were reviewed/authorized by Weston Brass, PharmD.  He was notified by Kennieth Francois.         Prior Anticoagulation Instructions: INR 2.3  Continue taking 1 tablet (5mg ) tablet every day except take 1/2 tablet (2.5mg ) on Mondays and Fridays.  Recheck in 4 weeks.    Current Anticoagulation Instructions: INR 3.3  Skip today's dose, then tomorrow (Saturday) resume regular dose of one tablet every day except one-half tablet on Monday and Friday.  Return to clinic in three weeks.

## 2010-05-20 NOTE — Medication Information (Signed)
Summary: rov/ewj  Anticoagulant Therapy  Managed by: Shelby Dubin, PharmD, BCPS, CPP Referring MD: Marga Melnick MD Supervising MD: Tenny Craw MD, Gunnar Fusi Indication 1: Atrial Fibrillation (ICD-427.31) Lab Used: [facility] Site: [name] INR POC 2.4 INR RANGE 2 - 3  Dietary changes: no    Health status changes: no    Bleeding/hemorrhagic complications: no    Recent/future hospitalizations: no    Any changes in medication regimen? no    Recent/future dental: no  Any missed doses?: no       Is patient compliant with meds? yes       Allergies (verified): No Known Drug Allergies  Anticoagulation Management History:      The patient is taking warfarin and comes in today for a routine follow up visit.  Positive risk factors for bleeding include an age of 72 years or older and presence of serious comorbidities.  The bleeding index is 'intermediate risk'.  Positive CHADS2 values include History of HTN.  Negative CHADS2 values include Age > 34 years old.  The start date was 05/05/2000.  Anticoagulation responsible provider: Tenny Craw MD, Gunnar Fusi.  INR POC: 2.4.  Cuvette Lot#: [id number].  Exp: 01/2010.    Anticoagulation Management Assessment/Plan:      The patient's current anticoagulation dose is Warfarin sodium 5 mg tabs: Take as directed by coumadin clinic..  The target INR is 2 - 3.  The next INR is due 12/26/2008.  Anticoagulation instructions were given to patient.  Results were reviewed/authorized by Shelby Dubin, PharmD, BCPS, CPP.  He was notified by Shelby Dubin PharmD, BCPS, CPP.         Prior Anticoagulation Instructions: INR 3.3  Skip tomorrow's dose of coumadin then start 1 tablet daily except 1/2 tablet on Mondays and Fridays.    Current Anticoagulation Instructions: INR 2.4  Continue 1 tab daily except 0.5 tabs on Mondays and Fridays.

## 2010-05-20 NOTE — Medication Information (Signed)
Summary: ROV/hmm  Anticoagulant Therapy  Managed by: Weston Brass, PharmD Referring MD: Marga Melnick MD Supervising MD: Ladona Ridgel MD, Sharlot Gowda Indication 1: Atrial Fibrillation (ICD-427.31) Lab Used: [facility] Site: [name] INR POC 2.7 INR RANGE 2 - 3  Dietary changes: no    Health status changes: no    Bleeding/hemorrhagic complications: no    Recent/future hospitalizations: no    Any changes in medication regimen? no    Recent/future dental: no  Any missed doses?: no       Is patient compliant with meds? yes       Current Medications (verified): 1)  Lisinopril 40 Mg Tabs (Lisinopril) .Marland Kitchen.. 1 & 1/2 By Mouth Once Daily 2)  Warfarin Sodium 5 Mg Tabs (Warfarin Sodium) .... Take As Directed By Coumadin Clinic. 3)  Cozaar 100 Mg  Tabs (Losartan Potassium) .... Take One Tablet Daily**office Visit Due Now** 4)  Diltiazem Hcl 90 Mg  Tabs (Diltiazem Hcl) .Marland Kitchen.. 1 By Mouth Bid 5)  Labetalol Hcl 300 Mg  Tabs (Labetalol Hcl) .Marland Kitchen.. 1 Two Times A Day** Pt Due For Office Visit** 6)  Furosemide 40 Mg Tabs (Furosemide) .... Take Two Tablet By Mouth Daily.  Allergies (verified): No Known Drug Allergies  Anticoagulation Management History:      The patient is taking warfarin and comes in today for a routine follow up visit.  Positive risk factors for bleeding include an age of 72 years or older and presence of serious comorbidities.  The bleeding index is 'intermediate risk'.  Positive CHADS2 values include History of HTN.  Negative CHADS2 values include Age > 1 years old.  The start date was 05/05/2000.  Anticoagulation responsible provider: Ladona Ridgel MD, Sharlot Gowda.  INR POC: 2.7.  Cuvette Lot#: [id number].  Exp: 02/2010.    Anticoagulation Management Assessment/Plan:      The patient's current anticoagulation dose is Warfarin sodium 5 mg tabs: Take as directed by coumadin clinic..  The target INR is 2 - 3.  The next INR is due 02/20/2009.  Anticoagulation instructions were given to patient.  Results  were reviewed/authorized by Weston Brass, PharmD.  He was notified by Fanny Skates, PharmD Candidate.         Prior Anticoagulation Instructions: INR 2.3   Continue same dosing schedule below.   Current Anticoagulation Instructions: INR 2.7  Continue normal dosing schedule of 1/2 tablet on Mondays and Fridays, and 1 tablet on all other days.  Return to clinic in 4 weeks.

## 2010-05-20 NOTE — Progress Notes (Addendum)
Summary: Care PLan OVersight Nov thru Dec  Phone Note Outgoing Call   Call placed by: Acey Lav MD,  March 29, 2010 9:07 PM Details for Reason: Care Plan Oversight Summary of Call: [phone] (30 or more mins)  I have supervised home care and/or infusion therapy for this pt, including providing orders for care, review of labs and/or home health care plans, communicating with the home health care professionals and/or patient/caregivers to integrate current information into the medical treatment plan and/or adjust the medical therapy. This supervision has been provided for _32_minutes during the calendar month. Dates for this oversight November 4th thru December 3rd, 2011.  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

## 2010-05-20 NOTE — Letter (Signed)
Summary: [facility] Consult/[facility]  [facility] Consult/[facility]   Imported By: Lanelle Bal 03/28/2009 08:49:35  _____________________________________________________________________  External Attachment:    Type:   Image     Comment:   External Document

## 2010-05-20 NOTE — Letter (Signed)
Summary: Primary Care Consult Scheduled Letter  [facility] [place]  [address] [place], [place] [address]   Phone: [phone]  Fax: [phone]      07/26/2007 MRN: [id number]  JHAMARI MARKOWICZ [address] [place], [place]  [address]    Dear Mr. Clardy,      We have scheduled an appointment for you.  At the recommendation of Dr.HOPPER, we have scheduled you a consult with DR [facility] [address] on 04.14.09 @ 8:45 CK IN 8:30.  Their phone number is [phone].  If this appointment day and time is not convenient for you, please feel free to call the office of the doctor you are being referred to at the number listed above and reschedule the appointment.     It is important for you to keep your scheduled appointments. We are here to make sure you are given good patient care. If yu have questions or you have made changes to your appointment, please notify us at  [phone], ask for [facility].    Thank you,  Patient Care Coordinator Brayden Mix at [facility]

## 2010-05-20 NOTE — Assessment & Plan Note (Signed)
Summary: FLU SHOT//VGJ  Nurse Visit    Prior Medications: PROPRANOLOL HCL CR 160 MG CP24 (PROPRANOLOL HCL)  LISINOPRIL 40 MG TABS (LISINOPRIL)  WARFARIN SODIUM 5 MG TABS (WARFARIN SODIUM)  SPIRONOLACTONE 25 MG TABS (SPIRONOLACTONE)  COZAAR 100 MG  TABS (LOSARTAN POTASSIUM) TAKE ONE TABLET DAILY DILTIAZEM HCL 90 MG  TABS (DILTIAZEM HCL) 1 by mouth bid    Influenza Vaccine    Vaccine Type: Fluvax MCR    Site: left deltoid    Mfr: Sanofi Pasteur    Dose: 0.5 ml    Route: IM    Given by: [employer]    Exp. Date: 10/16/2007    Lot #: [id number]  Flu Vaccine Consent Questions    Do you have a history of severe allergic reactions to this vaccine? no    Any prior history of allergic reactions to egg and/or gelatin? no    Do you have a sensitivity to the preservative Thimersol? no    Do you have a past history of Guillan-Barre Syndrome? no    Do you currently have an acute febrile illness? no    Have you ever had a severe reaction to latex? no    Vaccine information given and explained to patient? yes   Orders Added: 1)  Influenza Vaccine MCR [00025]    ]

## 2010-05-20 NOTE — Letter (Signed)
Summary: Results Follow up Letter  [facility] [place]  [address] [place], [place] [address]   Phone: [phone]  Fax: [phone]    05/28/2007 MRN: [id number]  LORAIN KEAST [address] [place], [place]  [address]  Dear Mr. Suchecki,  The following are the results of your recent test(s):  Test         Result    Pap Smear:        Normal _____  Not Normal _____ Comments: ______________________________________________________ Cholesterol: LDL(Bad cholesterol):         Your goal is less than:         HDL (Good cholesterol):       Your goal is more than: Comments:  ______________________________________________________ Mammogram:        Normal _____  Not Normal _____ Comments:  ___________________________________________________________________ Hemoccult:        Normal _____  Not normal _______ Comments:    _____________________________________________________________________ Other Tests:  Please see attached results and comments   We routinely do not discuss normal results over the telephone.  If you desire a copy of the results, or you have any questions about this information we can discuss them at your next office visit.   Sincerely,
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
## 2010-05-20 NOTE — Progress Notes (Signed)
Summary: cancelled appt  Phone Note Outgoing Call   Summary of Call: dr. hopper per Sunny Schlein at [facility] patient cancelled his appointment with   dr. Kathrene Bongo 5.5.09 at 11:00 for renal cyst.  Initial call taken by: Charolette Child,  Aug 24, 2007 3:30 PM  Follow-up for Phone Call        see dr peterson's consult: "benign bilat cysts" Follow-up by: Marga Melnick MD,  Aug 26, 2007 4:56 PM

## 2010-05-20 NOTE — Medication Information (Signed)
Summary: rov/jb  Anticoagulant Therapy  Managed by: Weston Brass, PharmD Referring MD: Hillis Range PCP: Marga Melnick MD Supervising MD: Shirlee Latch MD, [place] Indication 1: Atrial Fibrillation (ICD-427.31) Lab Used: [facility] Site: [address] INR POC 3.2 INR RANGE 2 - 3  Dietary changes: no    Health status changes: no    Bleeding/hemorrhagic complications: no    Recent/future hospitalizations: no    Any changes in medication regimen? no    Recent/future dental: no  Any missed doses?: no       Is patient compliant with meds? yes       Allergies: No Known Drug Allergies  Anticoagulation Management History:      The patient is taking warfarin and comes in today for a routine follow up visit.  Positive risk factors for bleeding include an age of 72 years or older and presence of serious comorbidities.  The bleeding index is 'intermediate risk'.  Positive CHADS2 values include History of HTN.  Negative CHADS2 values include Age > 30 years old.  The start date was 05/05/2000.  Anticoagulation responsible provider: Shirlee Latch MD, [place].  INR POC: 3.2.  Cuvette Lot#: [id number].  Exp: 01/2011.    Anticoagulation Management Assessment/Plan:      The patient's current anticoagulation dose is Coumadin 5 mg tabs: Take as directed by Coumadin Clinic..  The target INR is 2 - 3.  The next INR is due 05/28/2010.  Anticoagulation instructions were given to patient.  Results were reviewed/authorized by Weston Brass, PharmD.  He was notified by Linward Headland, PharmD candidate.         Prior Anticoagulation Instructions: Cont current regimen Return to clinic on Jan 20th, at 315 pm   Current Anticoagulation Instructions: INR 3.2 (INR goal: 2-3)  Try to eat more greens.  Take 1 tablet everyday except 1/2 tablet on Mondays and Fridays.  Recheck in 3 weeks.

## 2010-05-20 NOTE — Letter (Signed)
Summary: Results Follow up Letter  [facility] [place]  [address] [place], [place] [address]   Phone: [phone]  Fax: [phone]    03/20/2009 MRN: [id number]  VERNEL LANGENDERFER [address] [place], [place]  [address]  Dear Mr. Ogando,  The following are the results of your recent test(s):  Test         Result    Pap Smear:        Normal _____  Not Normal _____ Comments: ______________________________________________________ Cholesterol: LDL(Bad cholesterol):         Your goal is less than:         HDL (Good cholesterol):       Your goal is more than: Comments:  ______________________________________________________ Mammogram:        Normal _____  Not Normal _____ Comments:  ___________________________________________________________________ Hemoccult:        Normal _____  Not normal _______ Comments:    _____________________________________________________________________ Other Tests: Please see attached labs done on 03/18/2009    We routinely do not discuss normal results over the telephone.  If you desire a copy of the results, or you have any questions about this information we can discuss them at your next office visit.   Sincerely,

## 2010-05-20 NOTE — Assessment & Plan Note (Signed)
   Vital Signs:  Patient Profile:   70 Years Old Male Weight:      228.50 pounds Pulse rate:   60 / minute Pulse rhythm:   regular Resp:     17 per minute BP sitting:   118 / 94  (left arm)  Pt. in pain?   no                Chief Complaint:  HTN.  History of Present Illness: BP @ home 124/84 this ;averages 130/85.No epistxis, headache,cardiopulmonary symptoms.He uses "lite salt" ,but eats fast food [employer]     Family History:    brother a.fib,CA liver,miniCVA    Review of Systems  CV      Complains of swelling of feet.      Denies difficulty breathing at night, difficulty breathing while lying down, leg cramps with exertion, and shortness of breath with exertion.      edema has no pattern or specific trigger  Resp      Denies cough, pleuritic, and sputum productive.   Physical Exam  Lungs:     Normal respiratory effort, chest expands symmetrically. Lungs are clear to auscultation, no crackles or wheezes. Heart:     bradycardia and irregular rhythm.   Pulses:     slightly decreased pedal pulses Extremities:     1/2-1+ left pedal edema and 1+ right pedal edema; varicose veins.      Impression & Recommendations:  Problem # 1:  HYPERTENSION, ESSENTIAL NOS (ICD-401.9) no change;goal =average <130/85;avoid sodium  Problem # 2:  edema due to venous insufficiency;edema improved off amlodipine support hose   Patient Instructions: 1)  restrict sodium;monitor BP;ssess response to support hose

## 2010-05-20 NOTE — Medication Information (Signed)
Summary: rov/ewj  Anticoagulant Therapy  Managed by: Eda Keys, PharmD Referring MD: Hillis Range PCP: Marga Melnick MD Supervising MD: Eden Emms MD, Theron Arista Indication 1: Atrial Fibrillation (ICD-427.31) Lab Used: [facility] Site: [name] INR RANGE 2 - 3  Dietary changes: no    Health status changes: no    Bleeding/hemorrhagic complications: no    Recent/future hospitalizations: no    Any changes in medication regimen? no    Recent/future dental: no  Any missed doses?: no       Is patient compliant with meds? yes       Allergies: No Known Drug Allergies  Anticoagulation Management History:      The patient is taking warfarin and comes in today for a routine follow up visit.  Positive risk factors for bleeding include an age of 72 years or older and presence of serious comorbidities.  The bleeding index is 'intermediate risk'.  Positive CHADS2 values include History of HTN.  Negative CHADS2 values include Age > 28 years old.  The start date was 05/05/2000.  Anticoagulation responsible provider: Eden Emms MD, Theron Arista.  Cuvette Lot#: [id number].  Exp: 08/2010.    Anticoagulation Management Assessment/Plan:      The patient's current anticoagulation dose is Warfarin sodium 5 mg tabs: Take as directed by coumadin clinic..  The target INR is 2 - 3.  The next INR is due 07/22/2009.  Anticoagulation instructions were given to patient.  Results were reviewed/authorized by Eda Keys, PharmD.  He was notified by Eda Keys.         Prior Anticoagulation Instructions: INR 2.1  Start taking previous dosage of 5mg  daily except 2.5mg  on Mondays and Fridays.  Recheck in 2 weeks.    Current Anticoagulation Instructions: INR 2.1  Continue current dosing schedule of 1/2 tablet on Monday and Friday and take 1 tablet all other days.  Return to clinic in 4 weeks.
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
## 2010-05-20 NOTE — Medication Information (Signed)
Summary: Letter Regarding Lisinopril/[facility]  Letter Regarding Lisinopril/[facility]   Imported By: Lanelle Bal 02/18/2010 12:22:28  _____________________________________________________________________  External Attachment:    Type:   Image     Comment:   External Document

## 2010-05-20 NOTE — Medication Information (Signed)
Summary: Med Issue Order/[facility]  Med Issue Order/[facility]   Imported By: Lanelle Bal 06/10/2009 09:45:48  _____________________________________________________________________  External Attachment:    Type:   Image     Comment:   External Document

## 2010-05-20 NOTE — Medication Information (Signed)
Summary: rov/sp      Allergies Added: NKDA Anticoagulant Therapy  Managed by: Samantha Crimes, PharmD Referring MD: Hillis Range PCP: Marga Melnick MD Supervising MD: Riley Kill MD, Maisie Fus Indication 1: Atrial Fibrillation (ICD-427.31) Lab Used: [facility] Site: [address] INR POC 2.9 INR RANGE 2 - 3  Dietary changes: no    Health status changes: no    Bleeding/hemorrhagic complications: no    Recent/future hospitalizations: no    Any changes in medication regimen? no    Recent/future dental: no  Any missed doses?: no       Is patient compliant with meds? yes       Current Medications (verified): 1)  Lisinopril 40 Mg Tabs (Lisinopril) .Marland Kitchen.. 1 By Mouth Once Daily 2)  Cardizem Cd 240 Mg Xr24h-Cap (Diltiazem Hcl Coated Beads) .... Once Daily 3)  Furosemide 40 Mg Tabs (Furosemide) .Marland Kitchen.. 1 By Mouth Qd 4)  Metoprolol Tartrate 100 Mg Tabs (Metoprolol Tartrate) .... Take One Tablet By Mouth Twice A Day 5)  Cardizem Cd 120 Mg Xr24h-Cap (Diltiazem Hcl Coated Beads) .Marland Kitchen.. 1 Capsule By Mouth Once Daily. Take Along With Cardizem Cd 240mg  To Make A Total of 360mg . 6)  Coumadin 5 Mg Tabs (Warfarin Sodium) .... Take As Directed By Coumadin Clinic. 7)  Amoxicillin 500 Mg Tabs (Amoxicillin) .... Take 1 Tablet By Mouth Three Times A Day 8)  Rocephin 1 Gm Solr (Ceftriaxone Sodium) .... 2 G Iv Daily  Allergies (verified): No Known Drug Allergies  Anticoagulation Management History:      Positive risk factors for bleeding include an age of 73 years or older and presence of serious comorbidities.  The bleeding index is 'intermediate risk'.  Positive CHADS2 values include History of HTN.  Negative CHADS2 values include Age > 59 years old.  The start date was 05/05/2000.  Anticoagulation responsible provider: Riley Kill MD, Maisie Fus.  INR POC: 2.9.  Exp: 01/2011.    Anticoagulation Management Assessment/Plan:      The patient's current anticoagulation dose is Coumadin 5 mg tabs: Take as directed  by Coumadin Clinic..  The target INR is 2 - 3.  The next INR is due 05/07/2010.  Anticoagulation instructions were given to patient.  Results were reviewed/authorized by Samantha Crimes, PharmD.         Prior Anticoagulation Instructions: INR 1.8 LMOM for pt to call for dosing. Bethena Midget, RN, BSN  March 25, 2010 2:14 PM  Today take extra 2.5mg s then change dose to 5mg s daily except 2.5mg s on Mondays and Fridays. Recheck in one week. Orders sent [facility].   Current Anticoagulation Instructions: Cont current regimen Return to clinic on Jan 20th, at 315 pm

## 2010-05-20 NOTE — Progress Notes (Signed)
Summary: hopper-dr Kathrene Bongo suggests urologist as well  Phone Note From Other Clinic Call back at [phone]   Caller: Wille Celeste Call For: [facility] dr Kathrene Bongo Summary of Call: dr Kathrene Bongo says that [name] probably needs a urology appt as well due to the hemorrhagic cyst. Initial call taken by: Gwen Pounds,  July 25, 2007 3:43 PM  Follow-up for Phone Call        OK Follow-up by: Marga Melnick MD,  July 26, 2007 10:15 AM

## 2010-05-20 NOTE — Medication Information (Signed)
Summary: rov/tm  Anticoagulant Therapy  Managed by: Cloyde Reams, RN, BSN Referring MD: Hillis Range PCP: Marga Melnick MD Supervising MD: Ladona Ridgel MD, Sharlot Gowda Indication 1: Atrial Fibrillation (ICD-427.31) Lab Used: [facility] Site: [name] INR POC 2.1 INR RANGE 2 - 3  Dietary changes: no    Health status changes: no    Bleeding/hemorrhagic complications: no    Recent/future hospitalizations: yes       Details: Knee sugery, has been followed by ortho since surgery drawn by Home health.  Any changes in medication regimen? no    Recent/future dental: no  Any missed doses?: no       Is patient compliant with meds? yes       Allergies (verified): No Known Drug Allergies  Anticoagulation Management History:      The patient is taking warfarin and comes in today for a routine follow up visit.  Positive risk factors for bleeding include an age of 72 years or older and presence of serious comorbidities.  The bleeding index is 'intermediate risk'.  Positive CHADS2 values include History of HTN.  Negative CHADS2 values include Age > 41 years old.  The start date was 05/05/2000.  Anticoagulation responsible provider: Ladona Ridgel MD, Sharlot Gowda.  INR POC: 2.1.  Cuvette Lot#: [id number].  Exp: 07/2010.    Anticoagulation Management Assessment/Plan:      The patient's current anticoagulation dose is Warfarin sodium 5 mg tabs: Take as directed by coumadin clinic..  The target INR is 2 - 3.  The next INR is due 06/24/2009.  Anticoagulation instructions were given to patient.  Results were reviewed/authorized by Cloyde Reams, RN, BSN.  He was notified by Cloyde Reams RN.         Prior Anticoagulation Instructions: INR 1.8 Today take extra 5mg s today because he actually only took 2.5mg s today because he thought is was Fridays. Resume 5mg s everyday except 2.5mg s on Mondays and Fridays. Take last dose on 05/06/09.  Restart per Surgeon's instructions. Pt. to call upon discharge.   Current  Anticoagulation Instructions: INR 2.1  Start taking previous dosage of 5mg  daily except 2.5mg  on Mondays and Fridays.  Recheck in 2 weeks.

## 2010-05-20 NOTE — Letter (Signed)
Summary: Primary Care Appointment Letter  [facility]  [address] [place], [place] [address]   Phone: [phone]  Fax: [phone]    11/09/2007 MRN: [id number]  Nathan [name] [address] [place], [place]  [address]  Dear [name],   Your Primary Care Physician Marga Melnick MD has indicated that:    ____x___it is time to schedule an appointment.    _______you missed your appointment on______ and need to call and          reschedule.    _______you need to have lab work done.    _______you need to schedule an appointment discuss lab or test results.    _______you need to call to reschedule your appointment that is                       scheduled on _________.     Please call our office as soon as possible. Our phone number is [phone]          _________. Please press option 1. Our office is open 8a-12noon and 1p-5p, Monday through Friday.     Thank you,    [facility]

## 2010-05-20 NOTE — Progress Notes (Signed)
Summary: ? about med Olegario Messier SEE EMR NOTE 10/28  Phone Note From Pharmacy   Caller: [facility]  [address] [id number] Summary of Call: [facility] [phone]  wanted to know if patient is to be taking diltiazem & verapamil -he hasnt had refill for a while wnted to make sure patient is taking meds correctly --- he wants to verify so he can note patient file Initial call taken by: Okey Regal [place],  February 13, 2007 9:45 AM  Follow-up for Phone Call        [facility] PLEASE SEE 10/28 EMR RX WRITTEN FOR BOTH MEDS PLEASE ADVISE Follow-up by: Kandice Hams,  February 13, 2007 2:37 PM  Additional Follow-up for Phone Call Additional follow up Details #1::        called pharmacist , patient is only on diltiazem and not verapamil Additional Follow-up by: Wendall Stade,  February 13, 2007 4:02 PM
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
## 2010-05-20 NOTE — Consult Note (Signed)
Summary: [facility]  [facility]   Imported By: Lanelle Bal 12/18/2007 12:30:36  _____________________________________________________________________  External Attachment:    Type:   Image     Comment:   External Document

## 2010-05-20 NOTE — Medication Information (Signed)
Summary: rov/tm  Anticoagulant Therapy  Managed by: Bethena Midget, RN, BSN Referring MD: Marga Melnick MD Supervising MD: Eden Emms MD, Theron Arista Indication 1: Atrial Fibrillation (ICD-427.31) Lab Used: [facility] Site: [name] PT 23.1 INR POC 3.6 INR RANGE 2 - 3  Dietary changes: yes       Details: ate less green veggies  Health status changes: no    Bleeding/hemorrhagic complications: no    Recent/future hospitalizations: no    Any changes in medication regimen? no    Recent/future dental: no  Any missed doses?: no       Is patient compliant with meds? yes       Allergies (verified): No Known Drug Allergies  Anticoagulation Management History:      The patient is taking warfarin and comes in today for a routine follow up visit.  Positive risk factors for bleeding include an age of 72 years or older and presence of serious comorbidities.  The bleeding index is 'intermediate risk'.  Positive CHADS2 values include History of HTN.  Negative CHADS2 values include Age > 33 years old.  The start date was 05/05/2000.  Prothrombin time is 23.1.  Anticoagulation responsible provider: Eden Emms MD, Theron Arista.  INR POC: 3.6.  Cuvette Lot#: [id number].  Exp: 10/2009.    Anticoagulation Management Assessment/Plan:      The patient's current anticoagulation dose is Warfarin sodium 5 mg tabs: Marland Kitchen  The next INR is due 10/31/2008.  Anticoagulation instructions were given to patient.  Results were reviewed/authorized by Bethena Midget, RN, BSN.  He was notified by Bethena Midget, RN, BSN.         Prior Anticoagulation Instructions: 5MG  QD 2.5MG  M  Current Anticoagulation Instructions: Skip Saturdays dose, then resume 1 pill everyday except 1/2 pill on Mondays

## 2010-05-20 NOTE — Medication Information (Signed)
Summary: rov/tm  Anticoagulant Therapy  Managed by: Weston Brass, PharmD Referring MD: Hillis Range PCP: Marga Melnick MD Supervising MD: Jens Som MD, Arlys John Indication 1: Atrial Fibrillation (ICD-427.31) Lab Used: [facility] Site: [name] INR POC 2.4 INR RANGE 2 - 3  Dietary changes: no    Health status changes: no    Bleeding/hemorrhagic complications: no    Recent/future hospitalizations: no    Any changes in medication regimen? no    Recent/future dental: no  Any missed doses?: no       Is patient compliant with meds? yes       Allergies: No Known Drug Allergies  Anticoagulation Management History:      The patient is taking warfarin and comes in today for a routine follow up visit.  Positive risk factors for bleeding include an age of 70 years or older and presence of serious comorbidities.  The bleeding index is 'intermediate risk'.  Positive CHADS2 values include History of HTN.  Negative CHADS2 values include Age > 13 years old.  The start date was 05/05/2000.  Anticoagulation responsible provider: Jens Som MD, Arlys John.  INR POC: 2.4.  Cuvette Lot#: [id number].  Exp: 09/2010.    Anticoagulation Management Assessment/Plan:      The patient's current anticoagulation dose is Warfarin sodium 5 mg tabs: Take as directed by coumadin clinic..  The target INR is 2 - 3.  The next INR is due 09/16/2009.  Anticoagulation instructions were given to patient.  Results were reviewed/authorized by Weston Brass, PharmD.  He was notified by Weston Brass PharmD.         Prior Anticoagulation Instructions: INR 2.1 Continue 1 tablet everyday except 1/2 tablet on Mondays and Fridays. Recheck in 4 weeks.   Current Anticoagulation Instructions: INR 2.4  Continue same dose of 1 tablet every day except 1/2 tablet on Monday and Friday

## 2010-05-20 NOTE — Assessment & Plan Note (Signed)
Summary: 6 MONTH ROA///SPH   Vital Signs:  Patient Profile:   70 Years Old Male Weight:      231.50 pounds Pulse rate:   64 / minute Pulse rhythm:   regular Resp:     17 per minute BP sitting:   138 / 90  (left arm) Cuff size:   large  Pt. in pain?   no  Vitals Entered By: Wendall Stade (March 08, 2007 1:18 PM)                  Chief Complaint:  6 month follow up .  Hypertension History:      He denies headache, chest pain, palpitations, dyspnea with exertion, orthopnea, PND, peripheral edema, visual symptoms, neurologic problems, syncope, and side effects from treatment.  He notes no problems with any antihypertensive medication side effects.  Further comments include: BP @ home 130/85-90. Uses cane because of prior injuries, not due to balance issues.        Positive major cardiovascular risk factors include male age 21 years old or older and hypertension.     Current Allergies (reviewed today): No known allergies       Physical Exam  General:     Well-developed,well-nourished,in no acute distress; alert,appropriate and cooperative throughout examination Lungs:     Normal respiratory effort, chest expands symmetrically. Lungs are clear to auscultation, no crackles or wheezes. Heart:     bradycardia and irregular rhythm.   Pulses:     R and L carotid,radial pulses are full and equal bilaterally. Decreased pedal pulses Extremities:     1+ left pedal edema and 1+ right pedal edema.      Impression & Recommendations:  Problem # 1:  HYPERTENSION, ESSENTIAL NOS (ICD-401.9)  The following medications were removed from the medication list:    Propranolol Hcl Cr 160 Mg Cp24 (Propranolol hcl) .Marland Kitchen... 1 by mouth qd    Doxazosin Mesylate 8 Mg Tabs (Doxazosin mesylate) .Marland Kitchen... 1/2 by mouth bid  His updated medication list for this problem includes:    Cozaar 100 Mg Tabs (Losartan potassium) .Marland Kitchen... 1 by mouth qd    Diltiazem Hcl Cr 90 Mg Cp12 (Diltiazem hcl) .Marland Kitchen... 1  by mouth bid    Lisinopril 40 Mg Tabs (Lisinopril) .Marland Kitchen... 1  and 1/2 tab qd    Labetalol Hcl 300 Mg Tabs (Labetalol hcl) .Marland Kitchen... 1 bid   Complete Medication List: 1)  Cozaar 100 Mg Tabs (Losartan potassium) .Marland Kitchen.. 1 by mouth qd 2)  Warfarin Sodium 5 Mg Tabs (Warfarin sodium) .... Followed in coumadin clinic 3)  Diltiazem Hcl Cr 90 Mg Cp12 (Diltiazem hcl) .Marland Kitchen.. 1 by mouth bid 4)  Lisinopril 40 Mg Tabs (Lisinopril) .Marland Kitchen.. 1  and 1/2 tab qd 5)  Labetalol Hcl 300 Mg Tabs (Labetalol hcl) .Marland Kitchen.. 1 bid  Hypertension Assessment/Plan:      The patient's hypertensive risk group is category B: At least one risk factor (excluding diabetes) with no target organ damage.  Today's blood pressure is 138/90.     Patient Instructions: 1)  Change Propranolol daily & Doxazosin 1/2 twice a day  to Labetalol two times a day . Complete stool cards. 2)  Please schedule a follow-up appointment in  2 & 1/2  months.    Prescriptions: LABETALOL HCL 300 MG  TABS (LABETALOL HCL) 1 bid  #60 x 5   Entered and Authorized by:   Marga Melnick MD   Signed by:   Marga Melnick MD on 03/08/2007  Method used:   Print then Give to Patient   RxID:   [id number] LISINOPRIL 40 MG  TABS (LISINOPRIL) 1  and 1/2 tab qd  #60 x 5   Entered and Authorized by:   Marga Melnick MD   Signed by:   Marga Melnick MD on 03/08/2007   Method used:   Print then Give to Patient   RxID:   [id number] DILTIAZEM HCL CR 90 MG  CP12 (DILTIAZEM HCL) 1 by mouth bid  #60 x 5   Entered and Authorized by:   Marga Melnick MD   Signed by:   Marga Melnick MD on 03/08/2007   Method used:   Print then Give to Patient   RxID:   [id number] COZAAR 100 MG  TABS (LOSARTAN POTASSIUM) 1 by mouth qd  #30 x 5   Entered and Authorized by:   Marga Melnick MD   Signed by:   Marga Melnick MD on 03/08/2007   Method used:   Print then Give to Patient   RxID:   [id number]  ]

## 2010-05-20 NOTE — Letter (Signed)
Summary: [facility] - OV  [facility] - [facility]   Imported By: Debby Freiberg 10/15/2009 09:23:26  _____________________________________________________________________  External Attachment:    Type:   Image     Comment:   External Document

## 2010-05-20 NOTE — Progress Notes (Signed)
Summary: hop--refill  Phone Note Refill Request   Refills Requested: Medication #1:  LABETALOL HCL 300 MG  TABS 1 bid   Last Refilled: 07/13/2007 [facility] [address][phone]  Initial call taken by: Freddy Jaksch,  August 13, 2007 12:51 PM      Prescriptions: LABETALOL HCL 300 MG  TABS (LABETALOL HCL) 1 bid  #60 x 5   Entered by:   Wendall Stade   Authorized by:   Marga Melnick MD   Signed by:   Wendall Stade on 08/13/2007   Method used:   [facility] to ...       [facility]  [address] [id number]       [address]       [place], [place]  [address]       Ph: [phone] or [phone]       Fax: [phone]   RxID:   [id number]

## 2010-05-20 NOTE — Progress Notes (Signed)
Summary: Nuc. Pre-Procedure  Phone Note Outgoing Call Call back at Home Phone [phone]   Call placed by: Irean Hong, RN,  March 26, 2009 12:16 PM Summary of Call: Left message with information on Myoview Information Sheet (see scanned document for details).      Nuclear Med Background Indications for Stress Test: Evaluation for Ischemia, Surgical Clearance  Indications Comments: Pending knee surgery  History: Echo, Myocardial Perfusion Study  History Comments: '02 MPS: thinning inferior , (-) ischemia,(-) infarction, not Gated due to AFIB. 12/03 Echo:EF=40-50%. Hx. Renal Insufficiency, Chronic AFIB     Nuclear Pre-Procedure Cardiac Risk Factors: Hypertension Height (in): 72  Nuclear Med Study Referring MD:  Marga Melnick MD

## 2010-05-20 NOTE — Assessment & Plan Note (Signed)
Summary: Cardiology Nuclear Study  Nuclear Med Background Indications for Stress Test: Evaluation for Ischemia, Surgical Clearance  Indications Comments: Pending knee surgery by Dr. Norlene Campbell.  History: Echo, Myocardial Perfusion Study  History Comments: '02 MPS: thinning inferior , (-) ischemia,(-) infarction, not Gated due to AFIB. 12/03 Echo:EF=40-50%. Hx. Renal Insufficiency, Chronic AFIB  Symptoms: Dizziness, DOE, Fatigue with Exertion, Light-Headedness, Palpitations    Nuclear Pre-Procedure Cardiac Risk Factors: Hypertension Caffeine/Decaff Intake: None NPO After: 7:00 PM Lungs: Clear IV 0.9% NS with Angio Cath: 20g     IV Site: (R) AC IV Started by: Stanton Kidney EMT-P Chest Size (in) 46     Height (in): 74 Weight (lb): 214 BMI: 27.58  Nuclear Med Study 1 or 2 day study:  1 day     Stress Test Type:  Eugenie Birks Reading MD:  Olga Millers, MD     Referring MD:  Hillis Range Resting Radionuclide:  Technetium 24m Tetrofosmin     Resting Radionuclide Dose:  11 mCi  Stress Radionuclide:  Technetium 29m Tetrofosmin     Stress Radionuclide Dose:  33 mCi   Stress Protocol   Lexiscan: 0.4 mg   Stress Test Technologist:  Irean Hong RN     Nuclear Technologist:  Domenic Polite CNMT  Rest Procedure  Myocardial perfusion imaging was performed at rest 45 minutes following the intravenous administration of Myoview Technetium 5m Tetrofosmin.  Stress Procedure  The patient received IV Lexiscan 0.4 mg over 15-seconds.  Myoview injected at 30-seconds.  There were no significant changes with infusion. The patient became hypotensive after Lexiscan, but was asymptomatic.  Quantitative spect images were obtained after a 45 minute delay.  QPS Raw Data Images:  There is no interference from other nuclear activity. Stress Images:  There is decreased uptake in the distal anterior wall and apex. Rest Images:  There is decreased uptake in the distal anterior wall and  apex. Subtraction (SDS):  No evidence of ischemia. Transient Ischemic Dilatation:  1.02  (Normal <1.22)  Lung/Heart Ratio:  .47  (Normal <0.45)  Quantitative Gated Spect Images QGS cine images:  non-gated study   Overall Impression  Exercise Capacity: Lexiscan study with no exercise. ECG Impression: No significant ST segment change suggestive of ischemia; patient in atrial fibrillation during the study. 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  Appended Document: Cardiology Nuclear Study pre-op knee surgery with Dr Cleophas Dunker report and office visit faxed  Appended Document: Cardiology Nuclear Study Proceed to surgery which is a low risk procedure.

## 2010-05-20 NOTE — Medication Information (Signed)
Summary: rov/sp      Allergies Added: NKDA Anticoagulant Therapy  Managed by: Reina Fuse, PharmD  Referring MD: Hillis Range PCP: Marga Melnick MD Supervising MD: Riley Kill MD, Maisie Fus Indication 1: Atrial Fibrillation (ICD-427.31) Lab Used: [facility] Site: [name] INR POC 3.6 INR RANGE 2 - 3  Dietary changes: no    Health status changes: no    Bleeding/hemorrhagic complications: no    Recent/future hospitalizations: no    Any changes in medication regimen? no    Recent/future dental: no  Any missed doses?: no       Is patient compliant with meds? yes       Current Medications (verified): 1)  Lisinopril 40 Mg Tabs (Lisinopril) .... 1/2 Tab Once Daily 2)  Cardizem Cd 240 Mg Xr24h-Cap (Diltiazem Hcl Coated Beads) .... Once Daily 3)  Furosemide 40 Mg Tabs (Furosemide) .... Take 1 Tablets By Mouth Once Daily 4)  Metoprolol Tartrate 100 Mg Tabs (Metoprolol Tartrate) .... Take One Tablet By Mouth Twice A Day 5)  Cardizem Cd 120 Mg Xr24h-Cap (Diltiazem Hcl Coated Beads) .Marland Kitchen.. 1 Capsule By Mouth Once Daily. Take Along With Cardizem Cd 240mg  To Make A Total of 360mg . 6)  Coumadin 5 Mg Tabs (Warfarin Sodium) .... Take As Directed By Coumadin Clinic.  Allergies (verified): No Known Drug Allergies  Anticoagulation Management History:      The patient is taking warfarin and comes in today for a routine follow up visit.  Positive risk factors for bleeding include an age of 73 years or older and presence of serious comorbidities.  The bleeding index is 'intermediate risk'.  Positive CHADS2 values include History of HTN.  Negative CHADS2 values include Age > 47 years old.  The start date was 05/05/2000.  Anticoagulation responsible provider: Riley Kill MD, Maisie Fus.  INR POC: 3.6.  Cuvette Lot#: [id number].  Exp: 01/2011.    Anticoagulation Management Assessment/Plan:      The patient's current anticoagulation dose is Coumadin 5 mg tabs: Take as directed by Coumadin Clinic..  The target  INR is 2 - 3.  The next INR is due 02/19/2010.  Anticoagulation instructions were given to patient.  Results were reviewed/authorized by Reina Fuse, PharmD .  He was notified by Reina Fuse, PharmD.         Prior Anticoagulation Instructions: INR 3.3  Skip today's dose, then tomorrow (Saturday) resume regular dose of one tablet every day except one-half tablet on Monday and Friday.  Return to clinic in three weeks.   Current Anticoagulation Instructions: INR 3.6  Today, Friday, October 14th, do not take Coumadin. Then, take Coumadin 0.5 tab (2.5 mg) on Mon, Wed, Fri and Coumadin 1 tab (5 mg) on Sun, Tues, Thur, Sat. Return to clinic in 3 weeks.

## 2010-05-20 NOTE — Medication Information (Signed)
Summary: rov.m  Anticoagulant Therapy  Managed by: Shelby Dubin, PharmD, BCPS, CPP Referring MD: Marga Melnick MD Supervising MD: Ladona Ridgel MD, Sharlot Gowda Indication 1: Atrial Fibrillation (ICD-427.31) [facility]  Site: [name] PT 22.0 INR POC 3.3 INR RANGE 2 - 3  Dietary changes: no    Health status changes: no    Bleeding/hemorrhagic complications: no    Recent/future hospitalizations: no    Any changes in medication regimen? no    Recent/future dental: no  Any missed doses?: no       Is patient compliant with meds? yes       Current Medications (verified): 1)  Lisinopril 40 Mg Tabs (Lisinopril) .Marland Kitchen.. 1 & 1/2 By Mouth Once Daily 2)  Warfarin Sodium 5 Mg Tabs (Warfarin Sodium) .... Take As Directed By Coumadin Clinic. 3)  Cozaar 100 Mg  Tabs (Losartan Potassium) .... Take One Tablet Daily**office Visit Due Now** 4)  Diltiazem Hcl 90 Mg  Tabs (Diltiazem Hcl) .Marland Kitchen.. 1 By Mouth Bid 5)  Labetalol Hcl 300 Mg  Tabs (Labetalol Hcl) .Marland Kitchen.. 1 Two Times A Day** Pt Due For Office Visit** 6)  Furosemide 40 Mg Tabs (Furosemide) .... Take Two Tablet By Mouth Daily.  Allergies (verified): No Known Drug Allergies  Anticoagulation Management History:      The patient is taking warfarin and comes in today for a routine follow up visit.  Positive risk factors for bleeding include an age of 93 years or older and presence of serious comorbidities.  The bleeding index is 'intermediate risk'.  Positive CHADS2 values include History of HTN.  Negative CHADS2 values include Age > 65 years old.  The start date was 05/05/2000.  Prothrombin time is 22.0.  Anticoagulation responsible Vijay Durflinger: Ladona Ridgel MD, Sharlot Gowda.  INR POC: 3.3.  Cuvette Lot#: [id number].  Exp: 10/2009.    Anticoagulation Management Assessment/Plan:      The patient's current anticoagulation dose is Warfarin sodium 5 mg tabs: Take as directed by coumadin clinic..  The target INR is 2 - 3.  The next INR is due 11/28/2008.  Anticoagulation  instructions were given to patient.  Results were reviewed/authorized by Shelby Dubin, PharmD, BCPS, CPP.  He was notified by Cloyde Reams RN.         Prior Anticoagulation Instructions: Skip Saturdays dose, then resume 1 pill everyday except 1/2 pill on Mondays  Current Anticoagulation Instructions: INR 3.3  Skip tomorrow's dose of coumadin then start 1 tablet daily except 1/2 tablet on Mondays and Fridays.

## 2010-05-20 NOTE — Assessment & Plan Note (Signed)
Summary: pt instructions    Patient Instructions: 1)  Your physician recommends that you schedule a follow-up appointment in: 6 months with Dr Johney Frame 2)  Salt restrictions 3)  Your physician has recommended you make the following change in your medication: decrese Furosemide to 40mg  daily

## 2010-05-20 NOTE — Letter (Signed)
Summary: Handout Printed  Printed Handout:  - Coumadin Instructions 

## 2010-05-20 NOTE — Medication Information (Signed)
Summary: rov/ewj  Anticoagulant Therapy  Managed by: Bethena Midget, RN, BSN Referring MD: Hillis Range PCP: Marga Melnick MD Supervising MD: Graciela Husbands MD, Viviann Spare Indication 1: Atrial Fibrillation (ICD-427.31) Lab Used: [facility] Site: [name] INR POC 1.8 INR RANGE 2 - 3  Dietary changes: no    Health status changes: yes       Details: knee pain  Bleeding/hemorrhagic complications: no    Recent/future hospitalizations: yes       Details: Pending Rt. knee Sx.   Any changes in medication regimen? yes       Details: PRN pain med  Recent/future dental: no  Any missed doses?: no       Is patient compliant with meds? yes      Comments: Pending Rt. knee replacement on 05/12/09 with Dr. Norlene Campbell. Cleared to be off for 5 days per Dr. Johney Frame.   Allergies: No Known Drug Allergies  Anticoagulation Management History:      The patient is taking warfarin and comes in today for a routine follow up visit.  Positive risk factors for bleeding include an age of 19 years or older and presence of serious comorbidities.  The bleeding index is 'intermediate risk'.  Positive CHADS2 values include History of HTN.  Negative CHADS2 values include Age > 40 years old.  The start date was 05/05/2000.  Anticoagulation responsible Kinney Sackmann: Graciela Husbands MD, Viviann Spare.  INR POC: 1.8.  Cuvette Lot#: [id number].  Exp: 05/2010.    Anticoagulation Management Assessment/Plan:      The patient's current anticoagulation dose is Warfarin sodium 5 mg tabs: Take as directed by coumadin clinic..  The target INR is 2 - 3.  The next INR is due 05/22/2009.  Anticoagulation instructions were given to patient.  Results were reviewed/authorized by Bethena Midget, RN, BSN.  He was notified by Bethena Midget, RN, BSN.         Prior Anticoagulation Instructions: INR 3.3  Skip tomorrow's dose of coumadin, then resume same dosage 1 tablet daily except 1/2 tablet on Mondays and Fridays.  Recheck in 3-4 weeks.  Current Anticoagulation  Instructions: INR 1.8 Today take extra 5mg s today because he actually only took 2.5mg s today because he thought is was Fridays. Resume 5mg s everyday except 2.5mg s on Mondays and Fridays. Take last dose on 05/06/09.  Restart per Surgeon's instructions. Pt. to call upon discharge.

## 2010-05-20 NOTE — Miscellaneous (Signed)
Summary: Orders Update   Clinical Lists Changes  Orders: Added new Referral order of Radiology Referral (Radiology) - Signed 

## 2010-05-20 NOTE — Letter (Signed)
Summary: Results Follow up Letter  [facility] [place]  [address]   [place], [place] [address]   Phone: [phone]  Fax: [phone]    03/16/2007 MRN: [id number]  Nathan [name] [address] [place], [place]  [address]  Dear Mr. [name],  The following are the results of your recent test(s):  Test         Result    Pap Smear:        Normal _____  Not Normal _____ Comments: ______________________________________________________ Cholesterol: LDL(Bad cholesterol):         Your goal is less than:         HDL (Good cholesterol):       Your goal is more than: Comments:  ______________________________________________________ Mammogram:        Normal _____  Not Normal _____ Comments:  ___________________________________________________________________ Hemoccult:        Normal _X____  Not normal _______ Comments:    _____________________________________________________________________ Other Tests:    We routinely do not discuss normal results over the telephone.  If you desire a copy of the results, or you have any questions about this information we can discuss them at your next office visit.   Sincerely,    

## 2010-05-20 NOTE — Progress Notes (Signed)
Summary: refill**please call pt w/ questions**[facility]  Medications Added CARDIZEM CD 120 MG XR24H-CAP (DILTIAZEM HCL COATED BEADS) 1 capsule by mouth once daily. Take along with cardizem CD 240mg  to make a total of 360mg .       Phone Note Refill Request Call back at Home Phone [phone] Message from:  Patient on September 29, 2009 1:42 PM  Refills Requested: Medication #1:  CARDIZEM LA 120 MG XR24H-TAB one by mouth once daily   Supply Requested: 2.5 months [facility] [phone]   Method Requested: Fax to Mail Away Pharmacy Initial call taken by: Migdalia Dk,  September 29, 2009 1:43 PM    New/Updated Medications: CARDIZEM CD 120 MG XR24H-CAP (DILTIAZEM HCL COATED BEADS) 1 capsule by mouth once daily. Take along with cardizem CD 240mg  to make a total of 360mg . Prescriptions: CARDIZEM CD 120 MG XR24H-CAP (DILTIAZEM HCL COATED BEADS) 1 capsule by mouth once daily. Take along with cardizem CD 240mg  to make a total of 360mg .  #85 x 0   Entered by:   Laurance Flatten CMA   Authorized by:   Hillis Range, MD   Signed by:   Laurance Flatten CMA on 10/02/2009   Method used:   Faxed to ...       [facility] (mail-order)             , [place]         Ph: [phone]       Fax: [phone]   RxID:   [id number]

## 2010-05-20 NOTE — Miscellaneous (Signed)
Summary: Orders Update  Clinical Lists Changes  Orders: Added new Referral order of Nephrology Referral (Nephro) - Signed 

## 2010-05-20 NOTE — Letter (Signed)
Summary: Results Follow up Letter  [facility] [place]  [address] [place], [place] [address]   Phone: [phone]  Fax: [phone]    10/28/2008 MRN: [id number]  RAJAH TAGLIAFERRO [address] [place], [place]  [address]  Dear Mr. Ortega,  The following are the results of your recent test(s):  Test         Result    Pap Smear:        Normal _____  Not Normal _____ Comments: ______________________________________________________ Cholesterol: LDL(Bad cholesterol):         Your goal is less than:         HDL (Good cholesterol):       Your goal is more than: Comments:  ______________________________________________________ Mammogram:        Normal _____  Not Normal _____ Comments:  ___________________________________________________________________ Hemoccult:        Normal _____  Not normal _______ Comments:    _____________________________________________________________________ Other Tests: PLEASE SEE COPY OF LABS FROM 10/21/08 AND COMMENTS    We routinely do not discuss normal results over the telephone.  If you desire a copy of the results, or you have any questions about this information we can discuss them at your next office visit.   Sincerely,
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
## 2010-05-20 NOTE — Medication Information (Signed)
Summary: rov/kwkmw  Anticoagulant Therapy  Managed by: Cloyde Reams, RN, BSN Referring MD: Marga Melnick MD Supervising MD: Excell Seltzer MD, Casimiro Needle Indication 1: Atrial Fibrillation (ICD-427.31) Lab Used: [facility] Site: [name] INR POC 3.3 INR RANGE 2 - 3  Dietary changes: no    Health status changes: no    Bleeding/hemorrhagic complications: no    Recent/future hospitalizations: no    Any changes in medication regimen? no    Recent/future dental: no  Any missed doses?: no       Is patient compliant with meds? yes       Current Medications (verified): 1)  Lisinopril 40 Mg Tabs (Lisinopril) .Marland Kitchen.. 1 & 1/2 By Mouth Once Daily 2)  Warfarin Sodium 5 Mg Tabs (Warfarin Sodium) .... Take As Directed By Coumadin Clinic. 3)  Cozaar 100 Mg  Tabs (Losartan Potassium) .... 1/2 Once Daily 4)  Diltiazem Hcl 90 Mg  Tabs (Diltiazem Hcl) .Marland Kitchen.. 1 By Mouth Bid 5)  Labetalol Hcl 300 Mg  Tabs (Labetalol Hcl) .Marland Kitchen.. 1 Two Times A Day** Pt Due For Office Visit** 6)  Furosemide 40 Mg Tabs (Furosemide) .... Take Two Tablet By Mouth Daily.  Allergies (verified): No Known Drug Allergies  Anticoagulation Management History:      The patient is taking warfarin and comes in today for a routine follow up visit.  Positive risk factors for bleeding include an age of 72 years or older and presence of serious comorbidities.  The bleeding index is 'intermediate risk'.  Positive CHADS2 values include History of HTN.  Negative CHADS2 values include Age > 71 years old.  The start date was 05/05/2000.  Anticoagulation responsible provider: Excell Seltzer MD, Casimiro Needle.  INR POC: 3.3.  Cuvette Lot#: [id number].  Exp: 05/2010.    Anticoagulation Management Assessment/Plan:      The patient's current anticoagulation dose is Warfarin sodium 5 mg tabs: Take as directed by coumadin clinic..  The target INR is 2 - 3.  The next INR is due 04/16/2009.  Anticoagulation instructions were given to patient.  Results were  reviewed/authorized by Cloyde Reams, RN, BSN.  He was notified by Cloyde Reams RN.         Prior Anticoagulation Instructions: INR 2.8  Continue to take 1 tablet every day except on Monday and Friday take 1/2 tablet. Recheck INR in 4 weeks.  Current Anticoagulation Instructions: INR 3.3  Skip tomorrow's dose of coumadin, then resume same dosage 1 tablet daily except 1/2 tablet on Mondays and Fridays.  Recheck in 3-4 weeks.

## 2010-05-20 NOTE — Progress Notes (Signed)
Summary: Medication Concerns  Phone Note Call from Patient Call back at Home Phone [phone]   Caller: Patient Summary of Call: Message left on VM: Patient did not receive RX's from [facility] and he is about to run out of meds.  I reviewed patient's chart and Dr.Allred is the prescribing Dr for his meds and we sent it to them on 09/01/2008, I see where it was signed off on but unable to tell if RX's were sent by fax or called in.   I called the patient and left message on his VM for him to call Dr.Allreds office (I left the number for him) for futher follow-up on these med for they are the prescribing Dr. of these meds based on info in EMR Initial call taken by: Shonna Chock,  Sep 11, 2009 11:20 AM     Appended Document: Medication Concerns Error- date should say" We sent it to them on 09/01/2009

## 2010-05-20 NOTE — Assessment & Plan Note (Signed)
Summary: BP CHECK.CBS   Vital Signs:  Patient profile:   70 year old male Weight:      215.4 pounds Temp:     97.8 degrees F oral Pulse rate:   64 / minute Resp:     17 per minute BP sitting:   108 / 72  (left arm) Cuff size:   large  Vitals Entered By: Shonna Chock (October 21, 2008 2:42 PM) CC: B/P FOLLOW-UP, Hypertension Management Comments REVIEWED MED LIST, PATIENT AGREED DOSE AND INSTRUCTION CORRECT    CC:  B/P FOLLOW-UP and Hypertension Management.  History of Present Illness: BP @ home  averages 120/80.  Hypertension History:      He denies headache, chest pain, palpitations, dyspnea with exertion, orthopnea, PND, peripheral edema, visual symptoms, neurologic problems, syncope, and side effects from treatment.  He notes no problems with any antihypertensive medication side effects.        Positive major cardiovascular risk factors include male age 34 years old or older and hypertension.     Allergies (verified): No Known Drug Allergies  Past History:  Past Medical History: Anemia-NOS Atrial fibrillation Hypertension  Elevated PSA, Dr Vonita Moss Renal Cyst  Review of Systems Eyes:  Denies blurring, double vision, and vision loss-both eyes. ENT:  wax build up in R ear. GU:  PSA was elevated last month as per Dr Vonita Moss ; recheck 11/10/08. MS:  Cane used due to  soreness  L ankle. Neuro:  Denies numbness and tingling.  Physical Exam  General:  Appearsyounger than agein no acute distress; alert,appropriate and cooperative throughout examination Ears:  External ear exam shows no significant lesions or deformities.  Otoscopic examination reveals  wax bilaterally Lungs:  Normal respiratory effort, chest expands symmetrically. Lungs : minimal  crackles , no  wheezes. Heart:  bradycardia and irregular rhythm.   Abdomen:  Bowel sounds positive,abdomen soft and non-tender without masses, organomegaly or hernias noted. No AAA Pulses:  R and L carotid,radial,dorsalis pedis  and posterior tibial pulses are full and equal bilaterally Extremities:  trace left pedal edema.  Deformed L ankle Neurologic:  alert & oriented X3.   Skin:  Stasis hyperpigmentation LLE Psych:  memory intact for recent and remote, normally interactive, and good eye contact.     Impression & Recommendations:  Problem # 1:  HYPERTENSION, ESSENTIAL NOS (ICD-401.9)  Controlled His updated medication list for this problem includes:    Lisinopril 40 Mg Tabs (Lisinopril) .Marland Kitchen... 1 & 1/2 by mouth once daily    Cozaar 100 Mg Tabs (Losartan potassium) .Marland Kitchen... Take one tablet daily**office visit due now**    Diltiazem Hcl 90 Mg Tabs (Diltiazem hcl) .Marland Kitchen... 1 by mouth bid    Labetalol Hcl 300 Mg Tabs (Labetalol hcl) .Marland Kitchen... 1 two times a day** pt due for office visit**    Furosemide 40 Mg Tabs (Furosemide) .Marland Kitchen... Take two tablet by mouth daily.  Orders: TLB-Creatinine, Blood (82565-CREA) TLB-Potassium (K+) (84132-K) TLB-BUN (Urea Nitrogen) (84520-BUN)  Problem # 2:  ATRIAL FIBRILLATION (ICD-427.31)  His updated medication list for this problem includes:    Warfarin Sodium 5 Mg Tabs (Warfarin sodium)    Diltiazem Hcl 90 Mg Tabs (Diltiazem hcl) .Marland Kitchen... 1 by mouth bid    Labetalol Hcl 300 Mg Tabs (Labetalol hcl) .Marland Kitchen... 1 two times a day** pt due for office visit**  Orders: TLB-TSH (Thyroid Stimulating Hormone) (84443-TSH)  Complete Medication List: 1)  Lisinopril 40 Mg Tabs (Lisinopril) .Marland Kitchen.. 1 & 1/2 by mouth once daily 2)  Warfarin  Sodium 5 Mg Tabs (Warfarin sodium) 3)  Cozaar 100 Mg Tabs (Losartan potassium) .... Take one tablet daily**office visit due now** 4)  Diltiazem Hcl 90 Mg Tabs (Diltiazem hcl) .Marland Kitchen.. 1 by mouth bid 5)  Labetalol Hcl 300 Mg Tabs (Labetalol hcl) .Marland Kitchen.. 1 two times a day** pt due for office visit** 6)  Furosemide 40 Mg Tabs (Furosemide) .... Take two tablet by mouth daily.  Hypertension Assessment/Plan:      The patient's hypertensive risk group is category B: At least one risk  factor (excluding diabetes) with no target organ damage.  Today's blood pressure is 108/72.    Patient Instructions: 1)  Check your Blood Pressure regularly. If it is above: 135 /85 ON AVERAGE  you should make an appointment. Prescriptions: DILTIAZEM HCL 90 MG  TABS (DILTIAZEM HCL) 1 by mouth bid  #180 x 1   Entered and Authorized by:   Marga Melnick MD   Signed by:   Marga Melnick MD on 10/21/2008   Method used:   Print then Give to Patient   RxID:   [id number] FUROSEMIDE 40 MG TABS (FUROSEMIDE) Take two tablet by mouth daily.  #180 x 1   Entered and Authorized by:   Marga Melnick MD   Signed by:   Marga Melnick MD on 10/21/2008   Method used:   Print then Give to Patient   RxID:   [id number] LABETALOL HCL 300 MG  TABS (LABETALOL HCL) 1 two times a day** PT DUE FOR OFFICE VISIT**  #180 x 1   Entered and Authorized by:   Marga Melnick MD   Signed by:   Marga Melnick MD on 10/21/2008   Method used:   Print then Give to Patient   RxID:   [id number] COZAAR 100 MG  TABS (LOSARTAN POTASSIUM) TAKE ONE TABLET DAILY**OFFICE VISIT DUE NOW**  #90 x 1   Entered and Authorized by:   Marga Melnick MD   Signed by:   Marga Melnick MD on 10/21/2008   Method used:   Print then Give to Patient   RxID:   [id number] LISINOPRIL 40 MG TABS (LISINOPRIL) 1 & 1/2 by mouth once daily  #90 x 3   Entered and Authorized by:   Marga Melnick MD   Signed by:   Marga Melnick MD on 10/21/2008   Method used:   Print then Give to Patient   RxID:   [id number]   Appended Document: BP CHECK.CBS On ACE-I AND ARB ; Cozaar will be decreased to 100 mg 1/2 once daily & BP monitored. Cozaar will  be discontinued if BP averages < 135/85 ON AVERAGE on 1/2 once daily   Appended Document: BP CHECK.CBS

## 2010-05-20 NOTE — Medication Information (Signed)
Summary: Coumadin Clinic  Anticoagulant Therapy  Managed by: Bethena Midget, RN, BSN Referring MD: Hillis Range PCP: Marga Melnick MD Supervising MD: Graciela Husbands MD, Viviann Spare Indication 1: Atrial Fibrillation (ICD-427.31) Lab Used: [facility] Site: [address] INR POC 1.8 INR RANGE 2 - 3  Dietary changes: no    Health status changes: no    Bleeding/hemorrhagic complications: no    Recent/future hospitalizations: no    Any changes in medication regimen? yes       Details: IV via PICC line for past 6 weeks sees ID on Monday  Recent/future dental: no  Any missed doses?: no       Is patient compliant with meds? yes       Allergies: No Known Drug Allergies  Anticoagulation Management History:      His anticoagulation is being managed by telephone today.  Positive risk factors for bleeding include an age of 16 years or older and presence of serious comorbidities.  The bleeding index is 'intermediate risk'.  Positive CHADS2 values include History of HTN.  Negative CHADS2 values include Age > 10 years old.  The start date was 05/05/2000.  Anticoagulation responsible provider: Graciela Husbands MD, Viviann Spare.  INR POC: 1.8.    Anticoagulation Management Assessment/Plan:      The patient's current anticoagulation dose is Coumadin 5 mg tabs: Take as directed by Coumadin Clinic..  The target INR is 2 - 3.  The next INR is due 04/01/2010.  Anticoagulation instructions were given to patient.  Results were reviewed/authorized by Bethena Midget, RN, BSN.  He was notified by Bethena Midget, RN, BSN.         Prior Anticoagulation Instructions: INR 3.6  Today, Friday, October 14th, do not take Coumadin. Then, take Coumadin 0.5 tab (2.5 mg) on Mon, Wed, Fri and Coumadin 1 tab (5 mg) on Sun, Tues, Thur, Sat. Return to clinic in 3 weeks.     Current Anticoagulation Instructions: INR 1.8 LMOM for pt to call for dosing. Bethena Midget, RN, BSN  March 25, 2010 2:14 PM  Today take extra 2.5mg s then change dose  to 5mg s daily except 2.5mg s on Mondays and Fridays. Recheck in one week. Orders sent [facility].   Appended Document: Coumadin Clinic Orders resent to [place], INR to be drawn on 04/06/10

## 2010-05-20 NOTE — Medication Information (Signed)
Summary: Letter Regarding Interaction Between Lisinopril & Losartan [place]  Letter Regarding Interaction Between Lisinopril & Losartan Potassium/[facility]   Imported By: Lanelle Bal 02/20/2009 08:09:56  _____________________________________________________________________  External Attachment:    Type:   Image     Comment:   External Document

## 2010-05-20 NOTE — Progress Notes (Addendum)
Summary: Care Plan Oversight  Phone Note Outgoing Call   Call placed by: Acey Lav MD,  March 29, 2010 9:09 PM Summary of Call: [phone] (30 or more mins)  I have supervised home care and/or infusion therapy for this pt, including providing orders for care, review of labs and/or home health care plans, communicating with the home health care professionals and/or patient/caregivers to integrate current information into the medical treatment plan and/or adjust the medical therapy. This supervision has been provided for _32_minutes during the calendar month. 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

## 2010-05-20 NOTE — Letter (Signed)
[facility]: [facility]   Imported By: Freddy Jaksch 08/20/2007 14:34:54  _____________________________________________________________________  External Attachment:    Type:   Image     Comment:   External Document  Appended Document: [facility] Dr Vonita Moss: bladder neck contracture, benign bilat renal cysts, elevated PSA, R renal calculi

## 2010-05-20 NOTE — Medication Information (Signed)
Summary: ccr  Anticoagulant Therapy  Managed by: Elaina Pattee, PharmD Referring MD: Hillis Range PCP: Marga Melnick MD Supervising MD: Eden Emms MD, Theron Arista Indication 1: Atrial Fibrillation (ICD-427.31) Lab Used: [facility] Site: [name] INR POC 2.2 INR RANGE 2 - 3  Dietary changes: no    Health status changes: no    Bleeding/hemorrhagic complications: no    Recent/future hospitalizations: no    Any changes in medication regimen? no    Recent/future dental: no  Any missed doses?: no       Is patient compliant with meds? yes      Comments: Pt is going to hold off on changing to Pradaxa.  Current Medications (verified): 1)  Lisinopril 40 Mg Tabs (Lisinopril) .... 1/2 Tab Once Daily 2)  Cardizem La 360 Mg Xr24h-Tab (Diltiazem Hcl Coated Beads) .... One By Mouth Once Daily 3)  Furosemide 40 Mg Tabs (Furosemide) .... Take One Tablet By Mouth Once Daily 4)  Metoprolol Tartrate 100 Mg Tabs (Metoprolol Tartrate) .... Take One Tablet By Mouth Twice A Day 5)  Cardizem Cd 120 Mg Xr24h-Cap (Diltiazem Hcl Coated Beads) .Marland Kitchen.. 1 Capsule By Mouth Once Daily. Take Along With Cardizem Cd 240mg  To Make A Total of 360mg . 6)  Coumadin 5 Mg Tabs (Warfarin Sodium) .... Take As Directed By Coumadin Clinic.  Allergies (verified): No Known Drug Allergies  Anticoagulation Management History:      The patient is taking warfarin and comes in today for a routine follow up visit.  Positive risk factors for bleeding include an age of 34 years or older and presence of serious comorbidities.  The bleeding index is 'intermediate risk'.  Positive CHADS2 values include History of HTN.  Negative CHADS2 values include Age > 47 years old.  The start date was 05/05/2000.  Anticoagulation responsible provider: Eden Emms MD, Theron Arista.  INR POC: 2.2.  Cuvette Lot#: [id number].  Exp: 11/2010.    Anticoagulation Management Assessment/Plan:      The patient's current anticoagulation dose is Coumadin 5 mg tabs: Take as  directed by Coumadin Clinic..  The target INR is 2 - 3.  The next INR is due 11/04/2009.  Anticoagulation instructions were given to patient.  Results were reviewed/authorized by Elaina Pattee, PharmD.  He was notified by Elaina Pattee, PharmD.         Prior Anticoagulation Instructions: INR 2.1  Continue taking 1/2 tablet on Monday and Friday and 1 tablet all other days.  Call clinic when you have decided whether or not to change to Pradaxa.  At this time we will give you further instructions.  We will also make an appt at that time.    Current Anticoagulation Instructions: INR 2.2. Take 1 tablet daily except 0.5 tablet on Mon and Fri. Recheck in 4 weeks.

## 2010-05-20 NOTE — Medication Information (Signed)
Summary: rov/sp  Anticoagulant Therapy  Managed by: Weston Brass, PharmD Referring MD: Hillis Range PCP: Marga Melnick MD Supervising MD: Riley Kill MD, Maisie Fus Indication 1: Atrial Fibrillation (ICD-427.31) Lab Used: [facility] Site: [name] INR POC 2.3 INR RANGE 2 - 3  Dietary changes: no    Health status changes: no    Bleeding/hemorrhagic complications: no    Recent/future hospitalizations: no    Any changes in medication regimen? no    Recent/future dental: no  Any missed doses?: no       Is patient compliant with meds? yes       Allergies: No Known Drug Allergies  Anticoagulation Management History:      The patient is taking warfarin and comes in today for a routine follow up visit.  Positive risk factors for bleeding include an age of 72 years or older and presence of serious comorbidities.  The bleeding index is 'intermediate risk'.  Positive CHADS2 values include History of HTN.  Negative CHADS2 values include Age > 5 years old.  The start date was 05/05/2000.  Anticoagulation responsible Gera Inboden: Riley Kill MD, Maisie Fus.  INR POC: 2.3.  Cuvette Lot#: [id number].  Exp: 01/2011.    Anticoagulation Management Assessment/Plan:      The patient's current anticoagulation dose is Coumadin 5 mg tabs: Take as directed by Coumadin Clinic..  The target INR is 2 - 3.  The next INR is due 01/01/2010.  Anticoagulation instructions were given to patient.  Results were reviewed/authorized by Weston Brass, PharmD.  He was notified by Gweneth Fritter, PharmD Candidate.         Prior Anticoagulation Instructions: INR 2.3  Continue same dose of 1 tablet every day except 1/2 tablet on Monday and Friday  Current Anticoagulation Instructions: INR 2.3  Continue taking 1 tablet (5mg ) tablet every day except take 1/2 tablet (2.5mg ) on Mondays and Fridays.  Recheck in 4 weeks.

## 2010-05-20 NOTE — Miscellaneous (Signed)
Summary: Orders Update  Clinical Lists Changes  Problems: Added new problem of RENAL CYST (ICD-593.2) Orders: Added new Referral order of Radiology Referral (Radiology) - Signed

## 2010-05-20 NOTE — Miscellaneous (Signed)
  Clinical Lists Changes  Medications: Changed medication from LISINOPRIL 40 MG TABS (LISINOPRIL) to LISINOPRIL 40 MG TABS (LISINOPRIL) take one tablet daily
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
## 2010-05-20 NOTE — Progress Notes (Signed)
Summary: Refill Request  Phone Note Refill Request Message from:  Pharmacy  Refills Requested: Medication #1:  LISINOPRIL 40 MG TABS 1/2 tab once daily  Medication #2:  FUROSEMIDE 40 MG TABS Take one tablet by mouth once daily [facility] Fax: [phone], (Meds Rx'ed by Dr.Thai Hemrick)   Method Requested: Fax to Mail Away Pharmacy Initial call taken by: Shonna Chock,  Sep 01, 2009 10:19 AM

## 2010-05-20 NOTE — Progress Notes (Signed)
Summary: refill mes   Phone Note Refill Request Call back at Home Phone [phone] Message from:  Patient on December 15, 2009 2:25 PM  Refills Requested: Medication #1:  CARDIZEM CD 120 MG XR24H-CAP 1 capsule by mouth once daily. Take along with cardizem CD 240mg  to make a total of 360mg . [facility]. 3 months supply 3 refill.    Method Requested: Fax to Mail Away Pharmacy Initial call taken by: Lorne Skeens,  December 15, 2009 2:26 PM    Prescriptions: CARDIZEM CD 120 MG XR24H-CAP (DILTIAZEM HCL COATED BEADS) 1 capsule by mouth once daily. Take along with cardizem CD 240mg  to make a total of 360mg .  #90 x 3   Entered by:   Laurance Flatten CMA   Authorized by:   Hillis Range, MD   Signed by:   Laurance Flatten CMA on 12/15/2009   Method used:   Faxed to ...       [facility] (mail-order)             , [place]         Ph: [phone]       Fax: [phone]   RxID:   [id number]

## 2010-05-20 NOTE — Letter (Signed)
Summary: [facility]  [facility]   Imported By: Lanelle Bal 10/08/2008 14:16:48  _____________________________________________________________________  External Attachment:    Type:   Image     Comment:   External Document

## 2010-05-20 NOTE — Letter (Signed)
Summary: Results Follow up Letter  [facility] [place]  [address] [place], [place] [address]   Phone: [phone]  Fax: [phone]    03/16/2007 MRN: [id number]  Nathan [name] [address] [place], [place]  [address]  Dear Mr. Sen,  The following are the results of your recent test(s):  Test         Result    Pap Smear:        Normal _____  Not Normal _____ Comments: ______________________________________________________ Cholesterol: LDL(Bad cholesterol):         Your goal is less than:         HDL (Good cholesterol):       Your goal is more than: Comments:  ______________________________________________________ Mammogram:        Normal _____  Not Normal _____ Comments:  ___________________________________________________________________ Hemoccult:        Normal _X____  Not normal _______ Comments:    _____________________________________________________________________ Other Tests:    We routinely do not discuss normal results over the telephone.  If you desire a copy of the results, or you have any questions about this information we can discuss them at your next office visit.   Sincerely,

## 2010-05-20 NOTE — Letter (Signed)
Summary: Results Follow up Letter  [facility] [place]  [address] [place], [place] [address]   Phone: [phone]  Fax: [phone]    06/15/2007 MRN: [id number]  Nathan [name] [address] [place], [place]  [address]  Dear Nathan [name],  The following are the results of your recent test(s):  Test         Result    Pap Smear:        Normal _____  Not Normal _____ Comments: ______________________________________________________ Cholesterol: LDL(Bad cholesterol):         Your goal is less than:         HDL (Good cholesterol):       Your goal is more than: Comments:  ______________________________________________________ Mammogram:        Normal _____  Not Normal _____ Comments:  ___________________________________________________________________ Hemoccult:        Normal _____  Not normal _______ Comments:    _____________________________________________________________________ Other Tests: Please see attached results and comments    We routinely do not discuss normal results over the telephone.  If you desire a copy of the results, or you have any questions about this information we can discuss them at your next office visit.   Sincerely,

## 2010-05-20 NOTE — Progress Notes (Signed)
Summary: results called to patient  Phone Note Outgoing Call   Call placed by: Olegario Messier bixby Call placed to: Patient Summary of Call: called patient to let him know results of labs and that Helmut Muster will be calling with mri/mra appointment.  Copy of labs mailed to patient... Initial call taken by: Wendall Stade,  May 29, 2007 2:15 PM

## 2010-05-20 NOTE — Progress Notes (Signed)
Summary: call to patient emergently  Phone Note Outgoing Call   Call placed by: Olegario Messier bixby,cma Call placed to: Patient Summary of Call: I called Shelby Dubin at the coumadin clinic and she wanted me to call the patient and have him come right to the coumadin clinic to have his protime checked because of possible hemorrhage into kidney cyst.  Patient not at home and I left a detailed message for him.  I will try to reach him again a little later....................................................................Marland KitchenWendall Stade  June 28, 2007 2:41 PM   Follow-up for Phone Call        TRIED TO CONTACT PT AGAIN FOR KATHY..STILL KNOW ANSWER...................................................................Marland KitchenDaine Gip  June 28, 2007 4:16 PM Follow-up by: Daine Gip,  June 28, 2007 4:16 PM  Additional Follow-up for Phone Call Additional follow up Details #1::        patient got the message I left and got to the coumadin clinic....................................................................Marland KitchenWendall Stade  June 28, 2007 4:41 PM

## 2010-05-21 ENCOUNTER — Telehealth (INDEPENDENT_AMBULATORY_CARE_PROVIDER_SITE_OTHER): Payer: Self-pay | Admitting: *Deleted

## 2010-05-26 NOTE — Progress Notes (Signed)
  Phone Note Other Incoming   Request: Send information Summary of Call: Request for records received from [employer] [employer]. Request forwarded to [employer].

## 2010-05-28 ENCOUNTER — Encounter: Payer: Self-pay | Admitting: Cardiology

## 2010-05-28 ENCOUNTER — Encounter (INDEPENDENT_AMBULATORY_CARE_PROVIDER_SITE_OTHER): Payer: Medicare Other

## 2010-05-28 DIAGNOSIS — Z7901 Long term (current) use of anticoagulants: Secondary | ICD-10-CM

## 2010-05-28 DIAGNOSIS — I4891 Unspecified atrial fibrillation: Secondary | ICD-10-CM

## 2010-05-28 LAB — CONVERTED CEMR LAB: POC INR: 3.3

## 2010-06-03 ENCOUNTER — Encounter: Payer: Self-pay | Admitting: Internal Medicine

## 2010-06-03 ENCOUNTER — Encounter (INDEPENDENT_AMBULATORY_CARE_PROVIDER_SITE_OTHER): Payer: Medicare Other | Admitting: Internal Medicine

## 2010-06-03 DIAGNOSIS — Z85828 Personal history of other malignant neoplasm of skin: Secondary | ICD-10-CM | POA: Insufficient documentation

## 2010-06-03 DIAGNOSIS — Z0000 Encounter for general adult medical examination without abnormal findings: Secondary | ICD-10-CM

## 2010-06-03 DIAGNOSIS — I4891 Unspecified atrial fibrillation: Secondary | ICD-10-CM

## 2010-06-03 NOTE — Medication Information (Signed)
Summary: Coumadin Clinic  Anticoagulant Therapy  Managed by: Weston Brass, PharmD Referring MD: Hillis Range PCP: Marga Melnick MD Supervising MD: Daleen Squibb MD, Maisie Fus Indication 1: Atrial Fibrillation (ICD-427.31) Lab Used: [facility] Site: [address] INR POC 3.3 INR RANGE 2 - 3  Dietary changes: no    Health status changes: no    Bleeding/hemorrhagic complications: no    Recent/future hospitalizations: no    Any changes in medication regimen? no    Recent/future dental: no  Any missed doses?: no       Is patient compliant with meds? yes       Allergies: No Known Drug Allergies   Anticoagulation Management History:      The patient is taking warfarin and comes in today for a routine follow up visit.  Positive risk factors for bleeding include an age of 72 years or older and presence of serious comorbidities.  The bleeding index is 'intermediate risk'.  Positive CHADS2 values include History of HTN.  Negative CHADS2 values include Age > 96 years old.  The start date was 05/05/2000.  Anticoagulation responsible provider: Daleen Squibb MD, Maisie Fus.  INR POC: 3.3.  Cuvette Lot#: [id number].  Exp: 04/2011.    Anticoagulation Management Assessment/Plan:      The patient's current anticoagulation dose is Coumadin 5 mg tabs: Take as directed by Coumadin Clinic..  The target INR is 2 - 3.  The next INR is due 06/18/2010.  Anticoagulation instructions were given to patient.  Results were reviewed/authorized by Weston Brass, PharmD.  He was notified by Margot Chimes PharmD Candidate.         Prior Anticoagulation Instructions: INR 3.2 (INR goal: 2-3)  Try to eat more greens.  Take 1 tablet everyday except 1/2 tablet on Mondays and Fridays.  Recheck in 3 weeks.  Current Anticoagulation Instructions: INR 3.2   Eat some more greens tonight.  Then start your new Coumadin schedule of 1 tablet everyday except 1/2 tablet on Mondays, Wednesdays, and Fridays.  Recheck in 3 weeks.

## 2010-06-05 DIAGNOSIS — K5730 Diverticulosis of large intestine without perforation or abscess without bleeding: Secondary | ICD-10-CM | POA: Insufficient documentation
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
## 2010-06-15 ENCOUNTER — Encounter: Payer: Self-pay | Admitting: Internal Medicine

## 2010-06-15 DIAGNOSIS — I4891 Unspecified atrial fibrillation: Secondary | ICD-10-CM

## 2010-06-18 ENCOUNTER — Encounter (INDEPENDENT_AMBULATORY_CARE_PROVIDER_SITE_OTHER): Payer: Medicare Other

## 2010-06-18 ENCOUNTER — Encounter: Payer: Self-pay | Admitting: Cardiovascular Disease

## 2010-06-18 DIAGNOSIS — Z7901 Long term (current) use of anticoagulants: Secondary | ICD-10-CM

## 2010-06-18 DIAGNOSIS — I4891 Unspecified atrial fibrillation: Secondary | ICD-10-CM

## 2010-06-18 LAB — CONVERTED CEMR LAB: POC INR: 2

## 2010-06-22 ENCOUNTER — Encounter: Payer: Self-pay | Admitting: Licensed Clinical Social Worker

## 2010-06-24 NOTE — Medication Information (Signed)
Summary: rov/cb  Anticoagulant Therapy  Managed by: Windell Hummingbird, RN Referring MD: Hillis Range PCP: Marga Melnick MD Supervising MD: Clifton James MD, Cristal Deer Indication 1: Atrial Fibrillation (ICD-427.31) Lab Used: [facility] Site: [address] INR POC 2.0 INR RANGE 2 - 3  Dietary changes: no    Health status changes: no    Bleeding/hemorrhagic complications: no    Recent/future hospitalizations: no    Any changes in medication regimen? no    Recent/future dental: no  Any missed doses?: no       Is patient compliant with meds? yes       Allergies: No Known Drug Allergies  Anticoagulation Management History:      The patient is taking warfarin and comes in today for a routine follow up visit.  Positive risk factors for bleeding include an age of 72 years or older and presence of serious comorbidities.  The bleeding index is 'intermediate risk'.  Positive CHADS2 values include History of HTN.  Negative CHADS2 values include Age > 72 years old.  The start date was 05/05/2000.  Anticoagulation responsible provider: Clifton James MD, Cristal Deer.  INR POC: 2.0.  Cuvette Lot#: [id number].  Exp: 04/2011.    Anticoagulation Management Assessment/Plan:      The patient's current anticoagulation dose is Coumadin 5 mg tabs: Take as directed by Coumadin Clinic..  The target INR is 2 - 3.  The next INR is due 07/16/2010.  Anticoagulation instructions were given to patient.  Results were reviewed/authorized by Windell Hummingbird, RN.  He was notified by Windell Hummingbird, RN.         Prior Anticoagulation Instructions: INR 3.2   Eat some more greens tonight.  Then start your new Coumadin schedule of 1 tablet everyday except 1/2 tablet on Mondays, Wednesdays, and Fridays.  Recheck in 3 weeks.    Current Anticoagulation Instructions: INR 2.0 Continue taking 1 tablet every day, except take 1/2 tablet on Mondays, Wednesdays, and Fridays. Recheck in 4 weeks.

## 2010-06-25 ENCOUNTER — Telehealth (INDEPENDENT_AMBULATORY_CARE_PROVIDER_SITE_OTHER): Payer: Self-pay | Admitting: *Deleted

## 2010-06-29 LAB — BASIC METABOLIC PANEL
BUN: 11 mg/dL (ref 6–23)
BUN: 16 mg/dL (ref 6–23)
BUN: 17 mg/dL (ref 6–23)
CO2: 28 mEq/L (ref 19–32)
CO2: 28 mEq/L (ref 19–32)
CO2: 28 mEq/L (ref 19–32)
Calcium: 7.9 mg/dL — ABNORMAL LOW (ref 8.4–10.5)
Calcium: 8.2 mg/dL — ABNORMAL LOW (ref 8.4–10.5)
Calcium: 8.2 mg/dL — ABNORMAL LOW (ref 8.4–10.5)
Chloride: 103 mEq/L (ref 96–112)
Chloride: 105 mEq/L (ref 96–112)
Chloride: 105 mEq/L (ref 96–112)
Creatinine, Ser: 1.15 mg/dL (ref 0.4–1.5)
Creatinine, Ser: 1.2 mg/dL (ref 0.4–1.5)
Creatinine, Ser: 1.33 mg/dL (ref 0.4–1.5)
GFR calc Af Amer: >60 mL/min (ref >60)
GFR calc Af Amer: >60 mL/min (ref >60)
GFR calc Af Amer: >60 mL/min (ref >60)
GFR calc non Af Amer: 53 mL/min — ABNORMAL LOW (ref >60)
GFR calc non Af Amer: 60 mL/min — ABNORMAL LOW (ref >60)
GFR calc non Af Amer: >60 mL/min (ref >60)
Glucose, Bld: 78 mg/dL (ref 70–99)
Glucose, Bld: 87 mg/dL (ref 70–99)
Glucose, Bld: 96 mg/dL (ref 70–99)
Potassium: 3.5 mEq/L (ref 3.5–5.1)
Potassium: 3.5 mEq/L (ref 3.5–5.1)
Potassium: 3.8 mEq/L (ref 3.5–5.1)
Sodium: 139 mEq/L (ref 135–145)
Sodium: 139 mEq/L (ref 135–145)
Sodium: 142 mEq/L (ref 135–145)

## 2010-06-29 LAB — PROTIME-INR
INR: 1.3 (ref 0.00–1.49)
INR: 1.41 (ref 0.00–1.49)
INR: 1.62 — ABNORMAL HIGH (ref 0.00–1.49)
INR: 1.64 — ABNORMAL HIGH (ref 0.00–1.49)
Prothrombin Time: 16.4 seconds — ABNORMAL HIGH (ref 11.6–15.2)
Prothrombin Time: 17.5 seconds — ABNORMAL HIGH (ref 11.6–15.2)
Prothrombin Time: 19.4 seconds — ABNORMAL HIGH (ref 11.6–15.2)
Prothrombin Time: 19.6 seconds — ABNORMAL HIGH (ref 11.6–15.2)

## 2010-06-29 LAB — CBC
HCT: 31.2 % — ABNORMAL LOW (ref 39.0–52.0)
HCT: 32.1 % — ABNORMAL LOW (ref 39.0–52.0)
HCT: 34.1 % — ABNORMAL LOW (ref 39.0–52.0)
Hemoglobin: 10.1 g/dL — ABNORMAL LOW (ref 13.0–17.0)
Hemoglobin: 10.7 g/dL — ABNORMAL LOW (ref 13.0–17.0)
Hemoglobin: 9.8 g/dL — ABNORMAL LOW (ref 13.0–17.0)
MCH: 28.9 pg (ref 26.0–34.0)
MCH: 29.3 pg (ref 26.0–34.0)
MCH: 29.4 pg (ref 26.0–34.0)
MCHC: 31.4 g/dL (ref 30.0–36.0)
MCHC: 31.4 g/dL (ref 30.0–36.0)
MCHC: 31.5 g/dL (ref 30.0–36.0)
MCV: 92 fL (ref 78.0–100.0)
MCV: 93 fL (ref 78.0–100.0)
MCV: 93.7 fL (ref 78.0–100.0)
Platelets: 230 10*3/uL (ref 150–400)
Platelets: 255 10*3/uL (ref 150–400)
Platelets: 304 10*3/uL (ref 150–400)
RBC: 3.39 MIL/uL — ABNORMAL LOW (ref 4.22–5.81)
RBC: 3.45 MIL/uL — ABNORMAL LOW (ref 4.22–5.81)
RBC: 3.64 MIL/uL — ABNORMAL LOW (ref 4.22–5.81)
RDW: 14.8 % (ref 11.5–15.5)
RDW: 15 % (ref 11.5–15.5)
RDW: 15 % (ref 11.5–15.5)
WBC: 6.8 10*3/uL (ref 4.0–10.5)
WBC: 7.3 10*3/uL (ref 4.0–10.5)
WBC: 7.6 10*3/uL (ref 4.0–10.5)

## 2010-06-29 LAB — APTT: aPTT: 36 seconds (ref 24–37)

## 2010-06-29 NOTE — Progress Notes (Signed)
   [facility] Request received sent to [facility]  June 25, 2010 1:05 PM

## 2010-06-30 LAB — DIFFERENTIAL
Basophils Absolute: 0 10*3/uL (ref 0.0–0.1)
Basophils Relative: 0 % (ref 0–1)
Eosinophils Absolute: 0.1 10*3/uL (ref 0.0–0.7)
Eosinophils Relative: 2 % (ref 0–5)
Lymphocytes Relative: 13 % (ref 12–46)
Lymphs Abs: 1.2 10*3/uL (ref 0.7–4.0)
Monocytes Absolute: 0.8 10*3/uL (ref 0.1–1.0)
Monocytes Relative: 10 % (ref 3–12)
Neutro Abs: 6.7 10*3/uL (ref 1.7–7.7)
Neutrophils Relative %: 76 % (ref 43–77)

## 2010-06-30 LAB — COMPREHENSIVE METABOLIC PANEL WITH GFR
ALT: 10 U/L (ref 0–53)
AST: 14 U/L (ref 0–37)
Albumin: 2.9 g/dL — ABNORMAL LOW (ref 3.5–5.2)
Alkaline Phosphatase: 62 U/L (ref 39–117)
BUN: 21 mg/dL (ref 6–23)
CO2: 27 meq/L (ref 19–32)
Calcium: 9.1 mg/dL (ref 8.4–10.5)
Chloride: 109 meq/L (ref 96–112)
Creatinine, Ser: 1.4 mg/dL (ref 0.4–1.5)
GFR calc non Af Amer: 50 mL/min — ABNORMAL LOW
Glucose, Bld: 88 mg/dL (ref 70–99)
Potassium: 5.1 meq/L (ref 3.5–5.1)
Sodium: 145 meq/L (ref 135–145)
Total Bilirubin: 1 mg/dL (ref 0.3–1.2)
Total Protein: 7.4 g/dL (ref 6.0–8.3)

## 2010-06-30 LAB — URINALYSIS, ROUTINE W REFLEX MICROSCOPIC
Glucose, UA: NEGATIVE mg/dL
Hgb urine dipstick: NEGATIVE
Ketones, ur: 15 mg/dL — AB
Nitrite: POSITIVE — AB
Protein, ur: NEGATIVE mg/dL
Specific Gravity, Urine: 1.025 (ref 1.005–1.030)
Urobilinogen, UA: 1 mg/dL (ref 0.0–1.0)
pH: 6 (ref 5.0–8.0)

## 2010-06-30 LAB — SURGICAL PCR SCREEN
MRSA, PCR: NEGATIVE
Staphylococcus aureus: NEGATIVE

## 2010-06-30 LAB — PROTIME-INR
INR: 1.93 — ABNORMAL HIGH (ref 0.00–1.49)
INR: 2.45 — ABNORMAL HIGH (ref 0.00–1.49)
INR: 2.69 — ABNORMAL HIGH (ref 0.00–1.49)
Prothrombin Time: 22.2 s — ABNORMAL HIGH (ref 11.6–15.2)
Prothrombin Time: 26.7 seconds — ABNORMAL HIGH (ref 11.6–15.2)
Prothrombin Time: 28.7 s — ABNORMAL HIGH (ref 11.6–15.2)

## 2010-06-30 LAB — APTT: aPTT: 49 seconds — ABNORMAL HIGH (ref 24–37)

## 2010-06-30 LAB — URINE CULTURE
Colony Count: 15000
Culture  Setup Time: 201110261627

## 2010-06-30 LAB — TYPE AND SCREEN
ABO/RH(D): B POS
Antibody Screen: NEGATIVE

## 2010-06-30 LAB — CBC
HCT: 38.8 % — ABNORMAL LOW (ref 39.0–52.0)
Hemoglobin: 12.3 g/dL — ABNORMAL LOW (ref 13.0–17.0)
MCH: 29.7 pg (ref 26.0–34.0)
MCHC: 31.7 g/dL (ref 30.0–36.0)
MCV: 93.7 fL (ref 78.0–100.0)
Platelets: 375 10*3/uL (ref 150–400)
RBC: 4.14 MIL/uL — ABNORMAL LOW (ref 4.22–5.81)
RDW: 14.5 % (ref 11.5–15.5)
WBC: 8.9 10*3/uL (ref 4.0–10.5)

## 2010-06-30 LAB — URINE MICROSCOPIC-ADD ON

## 2010-07-01 ENCOUNTER — Encounter: Payer: Self-pay | Admitting: Internal Medicine

## 2010-07-01 ENCOUNTER — Ambulatory Visit (INDEPENDENT_AMBULATORY_CARE_PROVIDER_SITE_OTHER): Payer: Medicare Other | Admitting: Internal Medicine

## 2010-07-01 DIAGNOSIS — I10 Essential (primary) hypertension: Secondary | ICD-10-CM

## 2010-07-01 DIAGNOSIS — I4891 Unspecified atrial fibrillation: Secondary | ICD-10-CM

## 2010-07-05 LAB — BASIC METABOLIC PANEL
BUN: 26 mg/dL — ABNORMAL HIGH (ref 6–23)
BUN: 27 mg/dL — ABNORMAL HIGH (ref 6–23)
BUN: 27 mg/dL — ABNORMAL HIGH (ref 6–23)
BUN: 29 mg/dL — ABNORMAL HIGH (ref 6–23)
BUN: 29 mg/dL — ABNORMAL HIGH (ref 6–23)
BUN: 29 mg/dL — ABNORMAL HIGH (ref 6–23)
CO2: 26 mEq/L (ref 19–32)
CO2: 27 mEq/L (ref 19–32)
CO2: 28 mEq/L (ref 19–32)
CO2: 28 mEq/L (ref 19–32)
CO2: 29 mEq/L (ref 19–32)
CO2: 30 mEq/L (ref 19–32)
Calcium: 8 mg/dL — ABNORMAL LOW (ref 8.4–10.5)
Calcium: 8.1 mg/dL — ABNORMAL LOW (ref 8.4–10.5)
Calcium: 8.2 mg/dL — ABNORMAL LOW (ref 8.4–10.5)
Calcium: 8.2 mg/dL — ABNORMAL LOW (ref 8.4–10.5)
Calcium: 8.2 mg/dL — ABNORMAL LOW (ref 8.4–10.5)
Calcium: 8.7 mg/dL (ref 8.4–10.5)
Chloride: 103 mEq/L (ref 96–112)
Chloride: 104 mEq/L (ref 96–112)
Chloride: 104 mEq/L (ref 96–112)
Chloride: 104 mEq/L (ref 96–112)
Chloride: 104 mEq/L (ref 96–112)
Chloride: 105 mEq/L (ref 96–112)
Creatinine, Ser: 1.39 mg/dL (ref 0.4–1.5)
Creatinine, Ser: 1.57 mg/dL — ABNORMAL HIGH (ref 0.4–1.5)
Creatinine, Ser: 1.78 mg/dL — ABNORMAL HIGH (ref 0.4–1.5)
Creatinine, Ser: 1.82 mg/dL — ABNORMAL HIGH (ref 0.4–1.5)
Creatinine, Ser: 1.83 mg/dL — ABNORMAL HIGH (ref 0.4–1.5)
Creatinine, Ser: 1.87 mg/dL — ABNORMAL HIGH (ref 0.4–1.5)
GFR calc Af Amer: 43 mL/min — ABNORMAL LOW (ref >60)
GFR calc Af Amer: 44 mL/min — ABNORMAL LOW (ref >60)
GFR calc Af Amer: 45 mL/min — ABNORMAL LOW (ref >60)
GFR calc Af Amer: 46 mL/min — ABNORMAL LOW (ref >60)
GFR calc Af Amer: 53 mL/min — ABNORMAL LOW (ref >60)
GFR calc Af Amer: >60 mL/min (ref >60)
GFR calc non Af Amer: 36 mL/min — ABNORMAL LOW (ref >60)
GFR calc non Af Amer: 37 mL/min — ABNORMAL LOW (ref >60)
GFR calc non Af Amer: 37 mL/min — ABNORMAL LOW (ref >60)
GFR calc non Af Amer: 38 mL/min — ABNORMAL LOW (ref >60)
GFR calc non Af Amer: 44 mL/min — ABNORMAL LOW (ref >60)
GFR calc non Af Amer: 50 mL/min — ABNORMAL LOW (ref >60)
Glucose, Bld: 103 mg/dL — ABNORMAL HIGH (ref 70–99)
Glucose, Bld: 79 mg/dL (ref 70–99)
Glucose, Bld: 85 mg/dL (ref 70–99)
Glucose, Bld: 87 mg/dL (ref 70–99)
Glucose, Bld: 95 mg/dL (ref 70–99)
Glucose, Bld: 98 mg/dL (ref 70–99)
Potassium: 3.3 mEq/L — ABNORMAL LOW (ref 3.5–5.1)
Potassium: 3.8 mEq/L (ref 3.5–5.1)
Potassium: 3.8 mEq/L (ref 3.5–5.1)
Potassium: 3.9 mEq/L (ref 3.5–5.1)
Potassium: 3.9 mEq/L (ref 3.5–5.1)
Potassium: 3.9 mEq/L (ref 3.5–5.1)
Sodium: 137 mEq/L (ref 135–145)
Sodium: 140 mEq/L (ref 135–145)
Sodium: 140 mEq/L (ref 135–145)
Sodium: 141 mEq/L (ref 135–145)
Sodium: 142 mEq/L (ref 135–145)
Sodium: 144 mEq/L (ref 135–145)

## 2010-07-05 LAB — URINE CULTURE
Colony Count: NO GROWTH
Culture: NO GROWTH

## 2010-07-05 LAB — CBC
HCT: 32.3 % — ABNORMAL LOW (ref 39.0–52.0)
HCT: 32.4 % — ABNORMAL LOW (ref 39.0–52.0)
HCT: 34.5 % — ABNORMAL LOW (ref 39.0–52.0)
HCT: 35.3 % — ABNORMAL LOW (ref 39.0–52.0)
HCT: 35.3 % — ABNORMAL LOW (ref 39.0–52.0)
HCT: 41.3 % (ref 39.0–52.0)
HCT: 43.8 % (ref 39.0–52.0)
Hemoglobin: 10.6 g/dL — ABNORMAL LOW (ref 13.0–17.0)
Hemoglobin: 11.3 g/dL — ABNORMAL LOW (ref 13.0–17.0)
Hemoglobin: 11.9 g/dL — ABNORMAL LOW (ref 13.0–17.0)
Hemoglobin: 11.9 g/dL — ABNORMAL LOW (ref 13.0–17.0)
Hemoglobin: 12.3 g/dL — ABNORMAL LOW (ref 13.0–17.0)
Hemoglobin: 14.1 g/dL (ref 13.0–17.0)
Hemoglobin: 14.7 g/dL (ref 13.0–17.0)
MCHC: 33 g/dL (ref 30.0–36.0)
MCHC: 33.6 g/dL (ref 30.0–36.0)
MCHC: 33.6 g/dL (ref 30.0–36.0)
MCHC: 34.2 g/dL (ref 30.0–36.0)
MCHC: 34.6 g/dL (ref 30.0–36.0)
MCHC: 34.8 g/dL (ref 30.0–36.0)
MCHC: 35 g/dL (ref 30.0–36.0)
MCV: 94.7 fL (ref 78.0–100.0)
MCV: 95.3 fL (ref 78.0–100.0)
MCV: 95.4 fL (ref 78.0–100.0)
MCV: 95.7 fL (ref 78.0–100.0)
MCV: 95.7 fL (ref 78.0–100.0)
MCV: 96.8 fL (ref 78.0–100.0)
MCV: 97.3 fL (ref 78.0–100.0)
Platelets: 171 10*3/uL (ref 150–400)
Platelets: 176 10*3/uL (ref 150–400)
Platelets: 180 10*3/uL (ref 150–400)
Platelets: 197 10*3/uL (ref 150–400)
Platelets: 215 10*3/uL (ref 150–400)
Platelets: 247 10*3/uL (ref 150–400)
Platelets: 250 10*3/uL (ref 150–400)
RBC: 3.34 MIL/uL — ABNORMAL LOW (ref 4.22–5.81)
RBC: 3.39 MIL/uL — ABNORMAL LOW (ref 4.22–5.81)
RBC: 3.62 MIL/uL — ABNORMAL LOW (ref 4.22–5.81)
RBC: 3.63 MIL/uL — ABNORMAL LOW (ref 4.22–5.81)
RBC: 3.73 MIL/uL — ABNORMAL LOW (ref 4.22–5.81)
RBC: 4.33 MIL/uL (ref 4.22–5.81)
RBC: 4.57 MIL/uL (ref 4.22–5.81)
RDW: 13.4 % (ref 11.5–15.5)
RDW: 13.5 % (ref 11.5–15.5)
RDW: 13.6 % (ref 11.5–15.5)
RDW: 13.7 % (ref 11.5–15.5)
RDW: 13.8 % (ref 11.5–15.5)
RDW: 13.9 % (ref 11.5–15.5)
RDW: 13.9 % (ref 11.5–15.5)
WBC: 5.7 10*3/uL (ref 4.0–10.5)
WBC: 7.2 10*3/uL (ref 4.0–10.5)
WBC: 7.4 10*3/uL (ref 4.0–10.5)
WBC: 8.4 10*3/uL (ref 4.0–10.5)
WBC: 8.6 10*3/uL (ref 4.0–10.5)
WBC: 8.6 10*3/uL (ref 4.0–10.5)
WBC: 9.8 10*3/uL (ref 4.0–10.5)

## 2010-07-05 LAB — PROTIME-INR
INR: 1.29 (ref 0.00–1.49)
INR: 1.37 (ref 0.00–1.49)
INR: 1.59 — ABNORMAL HIGH (ref 0.00–1.49)
INR: 1.71 — ABNORMAL HIGH (ref 0.00–1.49)
INR: 1.74 — ABNORMAL HIGH (ref 0.00–1.49)
INR: 1.91 — ABNORMAL HIGH (ref 0.00–1.49)
INR: 1.92 — ABNORMAL HIGH (ref 0.00–1.49)
INR: 2.02 — ABNORMAL HIGH (ref 0.00–1.49)
Prothrombin Time: 16 seconds — ABNORMAL HIGH (ref 11.6–15.2)
Prothrombin Time: 16.8 seconds — ABNORMAL HIGH (ref 11.6–15.2)
Prothrombin Time: 18.8 seconds — ABNORMAL HIGH (ref 11.6–15.2)
Prothrombin Time: 19.9 seconds — ABNORMAL HIGH (ref 11.6–15.2)
Prothrombin Time: 20.2 seconds — ABNORMAL HIGH (ref 11.6–15.2)
Prothrombin Time: 21.7 seconds — ABNORMAL HIGH (ref 11.6–15.2)
Prothrombin Time: 21.8 seconds — ABNORMAL HIGH (ref 11.6–15.2)
Prothrombin Time: 22.7 seconds — ABNORMAL HIGH (ref 11.6–15.2)

## 2010-07-05 LAB — TSH
TSH: 0.851 u[IU]/mL (ref 0.350–4.500)
TSH: 0.878 u[IU]/mL (ref 0.350–4.500)

## 2010-07-05 LAB — TYPE AND SCREEN
ABO/RH(D): B POS
Antibody Screen: NEGATIVE

## 2010-07-05 LAB — TESTOSTERONE, FREE: Testosterone, Free: 11.4 pg/mL — ABNORMAL LOW (ref 47.0–244.0)

## 2010-07-05 LAB — COMPREHENSIVE METABOLIC PANEL
ALT: 11 U/L (ref 0–53)
AST: 14 U/L (ref 0–37)
Albumin: 3.9 g/dL (ref 3.5–5.2)
Alkaline Phosphatase: 70 U/L (ref 39–117)
BUN: 33 mg/dL — ABNORMAL HIGH (ref 6–23)
CO2: 29 mEq/L (ref 19–32)
Calcium: 9.1 mg/dL (ref 8.4–10.5)
Chloride: 104 mEq/L (ref 96–112)
Creatinine, Ser: 1.81 mg/dL — ABNORMAL HIGH (ref 0.4–1.5)
GFR calc Af Amer: 45 mL/min — ABNORMAL LOW (ref >60)
GFR calc non Af Amer: 37 mL/min — ABNORMAL LOW (ref >60)
Glucose, Bld: 86 mg/dL (ref 70–99)
Potassium: 3.9 mEq/L (ref 3.5–5.1)
Sodium: 143 mEq/L (ref 135–145)
Total Bilirubin: 1.1 mg/dL (ref 0.3–1.2)
Total Protein: 7.1 g/dL (ref 6.0–8.3)

## 2010-07-05 LAB — URINALYSIS, ROUTINE W REFLEX MICROSCOPIC
Bilirubin Urine: NEGATIVE
Glucose, UA: NEGATIVE mg/dL
Hgb urine dipstick: NEGATIVE
Ketones, ur: NEGATIVE mg/dL
Nitrite: NEGATIVE
Protein, ur: NEGATIVE mg/dL
Specific Gravity, Urine: 1.016 (ref 1.005–1.030)
Urobilinogen, UA: 0.2 mg/dL (ref 0.0–1.0)
pH: 5.5 (ref 5.0–8.0)

## 2010-07-05 LAB — TESTOSTERONE, % FREE: Testosterone-% Free: 2 % — ABNORMAL HIGH (ref 1.6–2.9)

## 2010-07-05 LAB — DIFFERENTIAL
Basophils Absolute: 0 10*3/uL (ref 0.0–0.1)
Basophils Relative: 0 % (ref 0–1)
Eosinophils Absolute: 0.3 10*3/uL (ref 0.0–0.7)
Eosinophils Relative: 4 % (ref 0–5)
Lymphocytes Relative: 12 % (ref 12–46)
Lymphs Abs: 0.9 10*3/uL (ref 0.7–4.0)
Monocytes Absolute: 0.7 10*3/uL (ref 0.1–1.0)
Monocytes Relative: 10 % (ref 3–12)
Neutro Abs: 5.3 10*3/uL (ref 1.7–7.7)
Neutrophils Relative %: 74 % (ref 43–77)

## 2010-07-05 LAB — MAGNESIUM
Magnesium: 1.5 mg/dL (ref 1.5–2.5)
Magnesium: 2.1 mg/dL (ref 1.5–2.5)

## 2010-07-05 LAB — LIPID PANEL
Cholesterol: 130 mg/dL (ref 0–200)
HDL: 39 mg/dL — ABNORMAL LOW (ref >39)
LDL Cholesterol: 76 mg/dL (ref 0–99)
Total CHOL/HDL Ratio: 3.3 RATIO
Triglycerides: 74 mg/dL (ref <150)
VLDL: 15 mg/dL (ref 0–40)

## 2010-07-05 LAB — VITAMIN D 25 HYDROXY (VIT D DEFICIENCY, FRACTURES): Vit D, 25-Hydroxy: 28 ng/mL — ABNORMAL LOW (ref 30–89)

## 2010-07-05 LAB — APTT: aPTT: 35 seconds (ref 24–37)

## 2010-07-05 LAB — GLUCOSE, CAPILLARY: Glucose-Capillary: 90 mg/dL (ref 70–99)

## 2010-07-05 LAB — BRAIN NATRIURETIC PEPTIDE: Pro B Natriuretic peptide (BNP): 374 pg/mL — ABNORMAL HIGH (ref 0.0–100.0)

## 2010-07-05 LAB — SEX HORMONE BINDING GLOBULIN: Sex Hormone Binding: 27 nmol/L (ref 13–71)

## 2010-07-05 LAB — ABO/RH: ABO/RH(D): B POS

## 2010-07-05 LAB — TESTOSTERONE: Testosterone: 56.37 ng/dL — ABNORMAL LOW (ref 350–890)
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
## 2010-07-16 ENCOUNTER — Ambulatory Visit (INDEPENDENT_AMBULATORY_CARE_PROVIDER_SITE_OTHER): Payer: Medicare Other | Admitting: *Deleted

## 2010-07-16 DIAGNOSIS — I4891 Unspecified atrial fibrillation: Secondary | ICD-10-CM

## 2010-07-16 DIAGNOSIS — Z7901 Long term (current) use of anticoagulants: Secondary | ICD-10-CM

## 2010-07-16 LAB — POCT INR: INR: 1.9

## 2010-07-16 NOTE — Patient Instructions (Signed)
INR 1.9  Take extra 1/2 tablet today then increase dose to 1 tablet every day except 1/2 tablet on Monday and Friday.  Recheck INR in 3 weeks.

## 2010-07-19 ENCOUNTER — Ambulatory Visit: Payer: Medicare Other | Admitting: Infectious Disease

## 2010-07-20 ENCOUNTER — Telehealth: Payer: Self-pay | Admitting: Cardiovascular Disease

## 2010-07-20 NOTE — Telephone Encounter (Signed)
Request received from [facility] sent to [facility] 07/20/10/KM

## 2010-07-29 ENCOUNTER — Other Ambulatory Visit: Payer: Self-pay | Admitting: Internal Medicine

## 2010-08-01 ENCOUNTER — Other Ambulatory Visit: Payer: Self-pay | Admitting: Internal Medicine

## 2010-08-02 ENCOUNTER — Telehealth: Payer: Self-pay | Admitting: Internal Medicine

## 2010-08-02 NOTE — Telephone Encounter (Signed)
Will send to Arlice Colt at [facility]

## 2010-08-04 ENCOUNTER — Ambulatory Visit (INDEPENDENT_AMBULATORY_CARE_PROVIDER_SITE_OTHER): Payer: Medicare Other | Admitting: Internal Medicine

## 2010-08-04 ENCOUNTER — Encounter: Payer: Self-pay | Admitting: Internal Medicine

## 2010-08-04 VITALS — BP 168/96 | HR 76 | Temp 97.5°F | Ht 74.0 in | Wt 214.5 lb

## 2010-08-04 DIAGNOSIS — I10 Essential (primary) hypertension: Secondary | ICD-10-CM

## 2010-08-04 DIAGNOSIS — M009 Pyogenic arthritis, unspecified: Secondary | ICD-10-CM

## 2010-08-04 DIAGNOSIS — T8450XA Infection and inflammatory reaction due to unspecified internal joint prosthesis, initial encounter: Secondary | ICD-10-CM

## 2010-08-04 NOTE — Patient Instructions (Signed)
Stop taking the amoxicillin antibiotics when this bottle runs out.

## 2010-08-04 NOTE — Progress Notes (Signed)
  Subjective:    Patient ID: Nathan [name], male    DOB: 13-Jan-1941, 70 y.o.   MRN: [id number]  HPI Nathan [name] had group B strep prosthetic right knee infection and underwent exchange arthroplasty on November 2 of last year. He was treated with Rocephin and he'll December 22 when he was switched to amoxicillin. Blood work done at that visit showed that his sedimentation rate was normal at 13 and his C. reactive protein was at the high end of normal at 0.6. He continues to take the amoxicillin without any difficulty and feels like his knee is doing much better.    Review of Systems     Objective:   Physical Exam  Constitutional: He appears well-developed and well-nourished. No distress.  Musculoskeletal:       His right knee incision is healed. He has no significant swelling, redness, or warmth. His knee is not painful.          Assessment & Plan:

## 2010-08-04 NOTE — Assessment & Plan Note (Signed)
I am hopeful that his right knee infection is cured. I will have him complete the last 2 weeks of his amoxicillin and then stop. I will repeat his lab work today.

## 2010-08-06 ENCOUNTER — Ambulatory Visit (INDEPENDENT_AMBULATORY_CARE_PROVIDER_SITE_OTHER): Payer: Medicare Other | Admitting: *Deleted

## 2010-08-06 DIAGNOSIS — I4891 Unspecified atrial fibrillation: Secondary | ICD-10-CM

## 2010-08-06 LAB — POCT INR: INR: 3

## 2010-09-03 ENCOUNTER — Encounter: Payer: Medicare Other | Admitting: *Deleted

## 2010-09-03 NOTE — Op Note (Signed)
   NAME:  Nathan [name], Nathan [name] NO.:  [id number]   MEDICAL RECORD NO.:  [id number]                   PATIENT TYPE:  AMB   LOCATION:  ENDO                                 FACILITY:  [facility]   PHYSICIAN:  Iva Boop, M.D. [facility]           DATE OF BIRTH:  [birth year]   DATE OF PROCEDURE:  01/21/2002  DATE OF DISCHARGE:  01/21/2002                                 OPERATIVE REPORT   PROCEDURE:  Esophageal manometry.   INDICATIONS:  This patient has a paraesophageal hernia and is undergoing  surgical intervention for potential surgical repair.   DESCRIPTION OF PROCEDURE:  The esophageal motility study with manometric  evaluation was carried out at the [facility] endoscopy department.  This was performed by Geanie Cooley, R.N.  I have reviewed the tracings and  the report and agree with the findings as outlined.   FINDINGS:  1. Lower esophageal sphincter resting pressure is 21.3 mmHg, which is     normal.  2. Esophageal peristalsis is normal.  There are good amplitudes overall.  3. The lower esophageal sphincter relaxation is 65%, which is below normal,     I suspect this is in part due to his hernia.  4. Upper esophageal sphincter appears to relax adequately and function     normally with mild increase in peak pressure at 218.5 mmHg with top     normal 190.   ASSESSMENT:  Overall normal esophageal manometry study with the exception of  some decrease in the percent relaxation of the lower esophageal sphincter  that I think is most likely due to his paraesophageal hernia and the effect  that that is having.  I see no obvious contraindication to surgery based  upon this analysis.                                               Iva Boop, M.D. [facility]    CEG/MEDQ  D:  01/24/2002  T:  01/24/2002  Job:  [id number]   cc:   Thornton Park Daphine Deutscher, M.D.  Fax: [phone]

## 2010-09-03 NOTE — Discharge Summary (Signed)
   NAME:  Nathan [name], Nathan [name] NO.:  [id number]   MEDICAL RECORD NO.:  [id number]                   PATIENT TYPE:  INP   LOCATION:  0380                                 FACILITY:  [facility]   PHYSICIAN:  Thornton Park. Daphine Deutscher, M.D.             DATE OF BIRTH:  [birth year]   DATE OF ADMISSION:  03/20/2002  DATE OF DISCHARGE:  03/29/2002                                 DISCHARGE SUMMARY   ADMISSION DIAGNOSIS:  Large hiatal hernia with gastroesophageal reflux with  underlying atrial fibrillation requiring Coumadin.   DISCHARGE DIAGNOSIS:  Large hiatal hernia with gastroesophageal reflux with  underlying atrial fibrillation requiring Coumadin.   PROCEDURE:  Laparoscopic Nissen fundoplication with repair of large hiatal  hernia with pledgets.  Surgeon:  Dr. Daphine Deutscher.   HOSPITAL COURSE:  The patient was an a.m. admission on March 20, 2002,  having been taken from his Coumadin having been on some Lovenox.  He  underwent repair of a large type 3 hiatal hernia as he had had a history of  stomach incarcerated in his chest with Cameron ulcers.  Postoperatively he  had some problems with rapid atrial fibrillation and was seen in  consultation by Dr. Dietrich Pates for Dr. Riley Kill to assist in his cardiac  management.  From the standpoint of his surgery we did obtain a Gastrografin  swallow which was fine.  He was restarted on his Coumadin.  This took  several days to try to get his INR up to an adequate level.  He also had  some difficulty managing his rate, which required some medication  adjustment.  He had a 2-D echo showing an ejection fraction of 40-50%.  He  continued to get better and was taking a liquid diet and was discharged on  March 29, 2002.  His diltiazem was increased to 600 mg b.i.d., and he was  to continue his verapamil.  Arrangements were made for cardiac Holter  monitoring and outpatient care.   FINAL DIAGNOSIS:  As mentioned above, status post repair of large  type 3  hiatal hernia.                                               Thornton Park Daphine Deutscher, M.D.    MBM/MEDQ  D:  04/08/2002  T:  04/08/2002  Job:  [id number]   cc:   [name]. Alwyn Ren, M.D. [facility]   Iva Boop, M.D. [facility]  [address] [place], [place] [address]  Fax: 1   Duke Salvia, M.D. [facility]   Maisie Fus D. Riley Kill, M.D. [facility]  [address]  [place]  [place] [address]  Fax: 1

## 2010-09-03 NOTE — Op Note (Signed)
NAME:  Nathan, [name] NO.:  [id number]   MEDICAL RECORD NO.:  [id number]                   PATIENT TYPE:  INP   LOCATION:  DAY                                  FACILITY:  [facility]   PHYSICIAN:  Nathan Park. Daphine [name], M.D.             DATE OF BIRTH:  12/15/1940   DATE OF PROCEDURE:  03/20/2002  DATE OF DISCHARGE:  03/29/2002                                 OPERATIVE REPORT   PREOPERATIVE DIAGNOSIS:  Large type 3  hiatal hernia with history of  stricture and a gastric ulcer within herniated portion of stomach.   POSTOPERATIVE DIAGNOSIS:  Large type 3  hiatal hernia with history of  stricture and a gastric ulcer within herniated portion of stomach.   OPERATION PERFORMED:  Laparoscopic Nissen fundoplication with repair of  hiatal hernia using pledgets.   SURGEON:  Nathan Park. Daphine [name], M.D.   ASSISTANT:  Sandria Bales. Ezzard Standing, M.D.   ANESTHESIA:  General endotracheal.   DESCRIPTION OF PROCEDURE:  The patient is a 70 year old gentleman who was  taken to room 1 at [facility] on March 20, 2002 and  given general anesthesia.  The abdomen was prepped with Betadine and draped  sterilely.  A longitudinal incision was made above the umbilicus and through  a pursestring suture, a Hasson cannula was introduced.  The abdomen was  insufflated.  A 5 mm port was placed in the upper midline followed by two 10-  11s on the right side and one in the left upper quadrant.  The stomach was  initially reduced but promptly scrolled back up into the mediastinum.  I  then went along and divided the peritoneal reflections at the right crease  and then we reduced the stomach and I took down the sac and in so dividing  it, I was able to maintain the stomach reduced into the abdomen.  I then  took down short gastrics and took these down completely which completely  delineated the left crux and portion of the stomach that was stuck above the  diaphragm posteriorly.  I  put a Penrose drain around the stomach and pulled  down on it while I completed the mobilization and the mediastinum.  Once  completed, I found the angle of the hiatus to be somewhat acute and I ended  up placing four sutures including the third one up with pledgets and tied  these.  This seemed to secure the closure and the hiatus seemed reasonably  snug around the esophagus.  I elected not to pass bougie's as the patient  has had previous strictures and a lot of friability and I felt that I had  everything well mobilized and could invaginate the distal esophagus and give  him a good wrap without a need for a large bougie.  Once the hiatal hernia  was closed I brought the stomach around retrograde and then found a  contiguous portion  of stomach to wrap.  There was a nice contiguous floppy  wrap.  It was sutured with three sutures using the Endo stitch placing two  and then a third suture was placed above the first.  All had clips placed on  them and all got purchases of esophagus.  No bleeding was noted.  Everything  looked very viable and healthy.  The patient did have some cysts in the left  lateral segment which we observed as we retracted the left lateral segment.  The umbilical port was tied down and repaired under direction vision with  the scope.  The abdomen was then deflated.  Marcaine was injected in the  port sites and the skin was  closed with 4-0 Vicryl and benzoin and Steri-Strips.  The patient seemed to  tolerate the procedure well and was taken to the recovery room in  satisfactory condition.                                                Nathan [name], M.D.    MBM/MEDQ  D:  03/20/2002  T:  03/20/2002  Job:  [id number]   cc:   [name]. Alwyn Ren, M.D. [facility]   Maisie Fus D. Riley Kill, M.D. [facility]  [address]  [place]  [place] [address]  Fax: 1   Iva Boop, M.D. [facility]  [address] [place], [place] [address]  Fax: 1

## 2010-09-03 NOTE — Consult Note (Signed)
NAME:  Nathan [name], AULETTA NO.:  [id number]   MEDICAL RECORD NO.:  [id number]                   PATIENT TYPE:  INP   LOCATION:  0380                                 [facility]ACILITY[facility]  [facility]   PHYSICIAN:  Monroe Bing, M.D. [facility]           DATE OF BIRTH:  July 07, 1940   DATE OF CONSULTATION:  03/21/2002  DATE OF DISCHARGE:                                   CONSULTATION   REFERRING PHYSICIAN:  Thornton Park. Daphine Deutscher, M.D.   PRIMARY CARDIOLOGIST:  Arturo Morton. Riley Kill, M.D.   PRIMARY CARE PHYSICIAN:  [name]. Alwyn Ren, M.D.   HISTORY OF PRESENT ILLNESS:  The patient is a 70 year old gentleman noted to  have an increased rate in atrial fibrillation after elective surgery.  The  patient was first found to have paroxysmal atrial fibrillation prior to  cataract surgery.  He has never really been symptomatic from this  arrhythmia.  He has been treated with beta blocker and calcium channel  antagonist to control his heart rate, and Warfarin.  Atrial fibrillation was  apparently intermittent and unusual when he first developed the arrhythmia,  there was none noted whenever he visits a physician.  The most recent  electrocardiogram for October demonstrates atrial fibrillation with a  controlled ventricular response.  The patient was cleared for surgery, and  underwent a laparoscopic Nissen fundoplasty yesterday.  His medications have  apparently been held for at least 24 and perhaps 48 hours.  As a result, his  heart rate has increased as high as 140.   MEDICATIONS:  1. Verapamil 240 mg q.d.  2. Labetalol 200 mg b.i.d.  3. Triamterene/hydrochlorothiazide.  4. Warfarin.  5. Prilosec.   PAST MEDICAL HISTORY:  Hypertension that has been well controlled medically.   SOCIAL HISTORY:  He lives in [place].  He is divorced.  Retired from work  as a Theatre stage manager.  Denies use of tobacco products or significant amounts of  alcohol.   FAMILY HISTORY:  Mother had coronary artery  disease; father had Alzheimer's.   REVIEW OF SYMPTOMS:  All negative.   PHYSICAL EXAMINATION:  GENERAL:  A very pleasant, well-appearing gentleman  in no acute distress.  VITAL SIGNS:  Temperature is 99, heart rate 90 and irregular, respirations  18, blood pressure 150/110.  HEENT:  Anicteric sclerae.  NECK:  No jugular venous distention.  No carotid bruits.  LUNGS:  Clear.  CARDIAC:  Normal first and second heart sounds; rapid irregular rhythm.  ABDOMEN:  Soft, nontender.  EXTREMITIES:  No edema; distal pulses intact.   LABORATORY DATA:  EKG rhythm strips:  Atrial fibrillation with rapid  ventricular response.  One 12 beat run of wide complex tachycardia, may  represent aberrancy.   IMPRESSION:  The patient has long-standing atrial fibrillation with normal  left ventricular dysfunction and no evidence for coronary disease on the  __________ Cardiolite study.  Rate control and re-anticoagulation is all  that is  required, his prior medications should be re-started with the dose  of labetalol adjusted upwards as needed to achieve an optimal heart rate.  I  cannot absolutely exclude non-sustained ventricular tachycardia by the  rhythm strips that are available, but would not recommend any additional  assessment even if this rhythm were present.  We will be happy to follow  this nice gentleman with you while he is in the hospital.                                                 Plandome Bing, M.D. [facility]    RR/MEDQ  D:  03/21/2002  T:  03/21/2002  Job:  [id number]

## 2010-09-06 ENCOUNTER — Ambulatory Visit (INDEPENDENT_AMBULATORY_CARE_PROVIDER_SITE_OTHER): Payer: Medicare Other | Admitting: *Deleted

## 2010-09-06 DIAGNOSIS — I4891 Unspecified atrial fibrillation: Secondary | ICD-10-CM

## 2010-09-06 LAB — POCT INR: INR: 2.6

## 2010-09-09 ENCOUNTER — Other Ambulatory Visit: Payer: Self-pay | Admitting: Internal Medicine

## 2010-10-04 ENCOUNTER — Encounter: Payer: Self-pay | Admitting: Internal Medicine

## 2010-10-04 ENCOUNTER — Ambulatory Visit (INDEPENDENT_AMBULATORY_CARE_PROVIDER_SITE_OTHER): Payer: Medicare Other | Admitting: Internal Medicine

## 2010-10-04 ENCOUNTER — Encounter: Payer: Medicare Other | Admitting: *Deleted

## 2010-10-04 VITALS — BP 132/85 | HR 62 | Temp 97.9°F | Ht 73.0 in | Wt 214.2 lb

## 2010-10-04 DIAGNOSIS — T8450XA Infection and inflammatory reaction due to unspecified internal joint prosthesis, initial encounter: Secondary | ICD-10-CM

## 2010-10-04 NOTE — Assessment & Plan Note (Signed)
I am encouraged by the fact that he does not have any signs of inflammation 5 weeks after completing 6 months of antibiotics for group B strep prosthetic joint infection. I will continue observation off of antibiotics and repeat sedimentation rate and C-reactive protein today. I will see him back in 6 weeks.

## 2010-10-04 NOTE — Progress Notes (Signed)
  Subjective:    Patient ID: Nathan [name], male    DOB: 09/12/1940, 70 y.o.   MRN: [id number]  HPI [name] is in for his routine followup. He finished all antibiotics about 5 weeks ago. He denies any new problems with his right knee and states that he is feeling better.    Review of Systems     Objective:   Physical Exam  Constitutional: No distress.       He is in good spirits. He walks with the assistance of a cane.  Musculoskeletal:       His right knee incision is fully healed without any signs of inflammation.          Assessment & Plan:

## 2010-10-05 ENCOUNTER — Ambulatory Visit (INDEPENDENT_AMBULATORY_CARE_PROVIDER_SITE_OTHER): Payer: Medicare Other | Admitting: *Deleted

## 2010-10-05 DIAGNOSIS — I4891 Unspecified atrial fibrillation: Secondary | ICD-10-CM

## 2010-10-05 LAB — POCT INR: INR: 3.4

## 2010-10-05 LAB — C-REACTIVE PROTEIN: CRP: 0.6 mg/dL — ABNORMAL HIGH (ref <0.6)

## 2010-10-05 LAB — SEDIMENTATION RATE: Sed Rate: 10 mm/hr (ref 0–16)

## 2010-10-22 ENCOUNTER — Encounter: Payer: Self-pay | Admitting: Internal Medicine

## 2010-10-25 ENCOUNTER — Other Ambulatory Visit: Payer: Self-pay | Admitting: Internal Medicine

## 2010-10-26 ENCOUNTER — Ambulatory Visit (INDEPENDENT_AMBULATORY_CARE_PROVIDER_SITE_OTHER): Payer: Medicare Other | Admitting: *Deleted

## 2010-10-26 DIAGNOSIS — I4891 Unspecified atrial fibrillation: Secondary | ICD-10-CM

## 2010-10-26 LAB — POCT INR: INR: 2.8

## 2010-11-16 ENCOUNTER — Ambulatory Visit: Payer: Medicare Other | Admitting: Internal Medicine

## 2010-11-22 ENCOUNTER — Ambulatory Visit (INDEPENDENT_AMBULATORY_CARE_PROVIDER_SITE_OTHER): Payer: Medicare Other | Admitting: Internal Medicine

## 2010-11-22 ENCOUNTER — Encounter: Payer: Self-pay | Admitting: Internal Medicine

## 2010-11-22 DIAGNOSIS — T8450XA Infection and inflammatory reaction due to unspecified internal joint prosthesis, initial encounter: Secondary | ICD-10-CM

## 2010-11-22 NOTE — Assessment & Plan Note (Signed)
I strongly suspect that his prosthetic knee infection has now been cured. His sedimentation rate was down to 10 and his C-reactive protein was down to 0.66 weeks ago when he has not had any signs of relapse after 3 months off of antibiotic therapy.

## 2010-11-22 NOTE — Progress Notes (Signed)
  Subjective:    Patient ID: Nathan [name], male    DOB: 01-24-1941, 70 y.o.   MRN: [id number]  HPI Nathan [name] is in for his routine visit. He has now been off of all antibiotics for almost 3 months following 6 months of therapy for group B strep right prosthetic knee infection. He denies having any pain but states that occasionally his knee will ache a little bit. Overall he is feeling much better.    Review of Systems     Objective:   Physical Exam  Constitutional: No distress.       He is in good spirits. He walks with the assistance of a cane.  Musculoskeletal:       His right knee incision is fully healed without any signs of inflammation.          Assessment & Plan:

## 2010-11-23 ENCOUNTER — Ambulatory Visit (INDEPENDENT_AMBULATORY_CARE_PROVIDER_SITE_OTHER): Payer: Medicare Other | Admitting: *Deleted

## 2010-11-23 DIAGNOSIS — I4891 Unspecified atrial fibrillation: Secondary | ICD-10-CM

## 2010-11-23 LAB — POCT INR: INR: 2.6

## 2010-11-24 ENCOUNTER — Other Ambulatory Visit: Payer: Self-pay

## 2010-11-24 MED ORDER — FUROSEMIDE 40 MG PO TABS: 40.0000 mg | ORAL_TABLET | Freq: Every day | ORAL | Status: DC

## 2010-11-24 MED ORDER — LISINOPRIL 40 MG PO TABS: 40.0000 mg | ORAL_TABLET | Freq: Every day | ORAL | Status: DC

## 2010-11-24 NOTE — Telephone Encounter (Signed)
RX's sent to [facility]

## 2010-11-25 ENCOUNTER — Other Ambulatory Visit: Payer: Self-pay | Admitting: *Deleted

## 2010-11-25 MED ORDER — METOPROLOL TARTRATE 100 MG PO TABS: 100.0000 mg | ORAL_TABLET | Freq: Two times a day (BID) | ORAL | Status: DC

## 2010-12-21 ENCOUNTER — Ambulatory Visit (INDEPENDENT_AMBULATORY_CARE_PROVIDER_SITE_OTHER): Payer: Medicare Other | Admitting: *Deleted

## 2010-12-21 DIAGNOSIS — I4891 Unspecified atrial fibrillation: Secondary | ICD-10-CM

## 2010-12-21 LAB — POCT INR: INR: 3.8

## 2011-01-12 ENCOUNTER — Ambulatory Visit (INDEPENDENT_AMBULATORY_CARE_PROVIDER_SITE_OTHER): Payer: Medicare Other | Admitting: *Deleted

## 2011-01-12 DIAGNOSIS — I4891 Unspecified atrial fibrillation: Secondary | ICD-10-CM

## 2011-01-12 LAB — POCT INR: INR: 2.5

## 2011-02-09 ENCOUNTER — Ambulatory Visit (INDEPENDENT_AMBULATORY_CARE_PROVIDER_SITE_OTHER): Payer: Medicare Other | Admitting: *Deleted

## 2011-02-09 DIAGNOSIS — I4891 Unspecified atrial fibrillation: Secondary | ICD-10-CM

## 2011-02-09 LAB — POCT INR: INR: 1.8

## 2011-03-09 ENCOUNTER — Ambulatory Visit (INDEPENDENT_AMBULATORY_CARE_PROVIDER_SITE_OTHER): Payer: Medicare Other | Admitting: *Deleted

## 2011-03-09 DIAGNOSIS — I4891 Unspecified atrial fibrillation: Secondary | ICD-10-CM

## 2011-03-09 LAB — POCT INR: INR: 2.1

## 2011-04-05 ENCOUNTER — Other Ambulatory Visit: Payer: Self-pay | Admitting: *Deleted

## 2011-04-05 ENCOUNTER — Encounter: Payer: Medicare Other | Admitting: *Deleted

## 2011-04-05 ENCOUNTER — Ambulatory Visit: Payer: Medicare Other | Admitting: Internal Medicine

## 2011-04-05 DIAGNOSIS — I10 Essential (primary) hypertension: Secondary | ICD-10-CM

## 2011-04-05 MED ORDER — DILTIAZEM HCL ER COATED BEADS 360 MG PO CP24: 360.0000 mg | ORAL_CAPSULE | Freq: Every day | ORAL | Status: DC

## 2011-04-14 ENCOUNTER — Encounter: Payer: Self-pay | Admitting: Internal Medicine

## 2011-04-14 ENCOUNTER — Ambulatory Visit (INDEPENDENT_AMBULATORY_CARE_PROVIDER_SITE_OTHER): Payer: Medicare Other | Admitting: *Deleted

## 2011-04-14 ENCOUNTER — Ambulatory Visit (INDEPENDENT_AMBULATORY_CARE_PROVIDER_SITE_OTHER): Payer: Medicare Other | Admitting: Internal Medicine

## 2011-04-14 DIAGNOSIS — I4891 Unspecified atrial fibrillation: Secondary | ICD-10-CM

## 2011-04-14 DIAGNOSIS — I10 Essential (primary) hypertension: Secondary | ICD-10-CM

## 2011-04-14 LAB — POCT INR: INR: 2.8

## 2011-04-14 NOTE — Patient Instructions (Signed)
Your physician wants you to follow-up in: 12 months with Dr Allred You will receive a reminder letter in the mail two months in advance. If you don't receive a letter, please call our office to schedule the follow-up appointment.  

## 2011-04-17 ENCOUNTER — Encounter: Payer: Self-pay | Admitting: Internal Medicine

## 2011-04-17 NOTE — Assessment & Plan Note (Signed)
Stable No change required today Goal INR 2-3 
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
## 2011-04-17 NOTE — Assessment & Plan Note (Signed)
Stable No change required today  

## 2011-04-26 ENCOUNTER — Ambulatory Visit (INDEPENDENT_AMBULATORY_CARE_PROVIDER_SITE_OTHER): Payer: Medicare Other

## 2011-04-26 DIAGNOSIS — Z23 Encounter for immunization: Secondary | ICD-10-CM

## 2011-05-12 ENCOUNTER — Encounter: Payer: Medicare Other | Admitting: *Deleted

## 2011-05-16 ENCOUNTER — Encounter: Payer: Medicare Other | Admitting: *Deleted

## 2011-05-18 ENCOUNTER — Ambulatory Visit (INDEPENDENT_AMBULATORY_CARE_PROVIDER_SITE_OTHER): Payer: Medicare Other | Admitting: *Deleted

## 2011-05-18 DIAGNOSIS — Z7901 Long term (current) use of anticoagulants: Secondary | ICD-10-CM

## 2011-05-18 DIAGNOSIS — I4891 Unspecified atrial fibrillation: Secondary | ICD-10-CM

## 2011-05-18 LAB — POCT INR: INR: 2.4

## 2011-06-29 ENCOUNTER — Ambulatory Visit (INDEPENDENT_AMBULATORY_CARE_PROVIDER_SITE_OTHER): Payer: Medicare Other | Admitting: *Deleted

## 2011-06-29 DIAGNOSIS — I4891 Unspecified atrial fibrillation: Secondary | ICD-10-CM

## 2011-06-29 DIAGNOSIS — Z7901 Long term (current) use of anticoagulants: Secondary | ICD-10-CM

## 2011-06-29 LAB — POCT INR: INR: 2.6

## 2011-07-06 ENCOUNTER — Other Ambulatory Visit: Payer: Self-pay

## 2011-07-07 ENCOUNTER — Other Ambulatory Visit: Payer: Self-pay | Admitting: *Deleted

## 2011-07-21 ENCOUNTER — Other Ambulatory Visit: Payer: Self-pay | Admitting: Internal Medicine

## 2011-07-26 ENCOUNTER — Other Ambulatory Visit: Payer: Self-pay | Admitting: *Deleted

## 2011-07-26 MED ORDER — WARFARIN SODIUM 5 MG PO TABS: ORAL_TABLET | ORAL | Status: DC

## 2011-08-10 ENCOUNTER — Ambulatory Visit (INDEPENDENT_AMBULATORY_CARE_PROVIDER_SITE_OTHER): Payer: Medicare Other | Admitting: *Deleted

## 2011-08-10 DIAGNOSIS — Z7901 Long term (current) use of anticoagulants: Secondary | ICD-10-CM

## 2011-08-10 DIAGNOSIS — I4891 Unspecified atrial fibrillation: Secondary | ICD-10-CM

## 2011-08-10 LAB — POCT INR: INR: 2.7

## 2011-08-18 ENCOUNTER — Other Ambulatory Visit: Payer: Self-pay | Admitting: Internal Medicine

## 2011-08-18 NOTE — Telephone Encounter (Signed)
Patient needs to schedule a CPX  

## 2011-09-09 ENCOUNTER — Telehealth: Payer: Self-pay | Admitting: Internal Medicine

## 2011-09-09 MED ORDER — LISINOPRIL 40 MG PO TABS: 40.0000 mg | ORAL_TABLET | Freq: Every day | ORAL | Status: DC

## 2011-09-09 NOTE — Telephone Encounter (Signed)
Refill: Lisinopril 40mg  tablet. Take 1 tablet every day. Qty 90. Last fill 05-30-11

## 2011-09-09 NOTE — Telephone Encounter (Signed)
Patient needs to schedule a CPX  

## 2011-09-20 ENCOUNTER — Other Ambulatory Visit: Payer: Self-pay | Admitting: Internal Medicine

## 2011-09-21 ENCOUNTER — Ambulatory Visit (INDEPENDENT_AMBULATORY_CARE_PROVIDER_SITE_OTHER): Payer: Medicare Other | Admitting: *Deleted

## 2011-09-21 DIAGNOSIS — I4891 Unspecified atrial fibrillation: Secondary | ICD-10-CM

## 2011-09-21 DIAGNOSIS — Z7901 Long term (current) use of anticoagulants: Secondary | ICD-10-CM

## 2011-09-21 LAB — POCT INR: INR: 1.7

## 2011-10-10 ENCOUNTER — Other Ambulatory Visit: Payer: Self-pay | Admitting: *Deleted

## 2011-10-10 MED ORDER — METOPROLOL TARTRATE 100 MG PO TABS: 100.0000 mg | ORAL_TABLET | Freq: Two times a day (BID) | ORAL | Status: DC

## 2011-10-19 ENCOUNTER — Ambulatory Visit (INDEPENDENT_AMBULATORY_CARE_PROVIDER_SITE_OTHER): Payer: Medicare Other | Admitting: Pharmacist

## 2011-10-19 DIAGNOSIS — I4891 Unspecified atrial fibrillation: Secondary | ICD-10-CM

## 2011-10-19 DIAGNOSIS — Z7901 Long term (current) use of anticoagulants: Secondary | ICD-10-CM

## 2011-10-19 LAB — POCT INR: INR: 2.9

## 2011-10-20 ENCOUNTER — Other Ambulatory Visit: Payer: Self-pay | Admitting: Internal Medicine

## 2011-11-16 ENCOUNTER — Ambulatory Visit (INDEPENDENT_AMBULATORY_CARE_PROVIDER_SITE_OTHER): Payer: Medicare Other | Admitting: *Deleted

## 2011-11-16 DIAGNOSIS — I4891 Unspecified atrial fibrillation: Secondary | ICD-10-CM

## 2011-11-16 DIAGNOSIS — Z7901 Long term (current) use of anticoagulants: Secondary | ICD-10-CM

## 2011-11-16 LAB — POCT INR: INR: 2.4

## 2011-11-21 ENCOUNTER — Other Ambulatory Visit: Payer: Self-pay | Admitting: Internal Medicine

## 2011-11-21 NOTE — Telephone Encounter (Signed)
Patient needs to schedule a f/u appointment

## 2011-11-25 ENCOUNTER — Other Ambulatory Visit: Payer: Self-pay

## 2011-11-25 MED ORDER — WARFARIN SODIUM 5 MG PO TABS: ORAL_TABLET | ORAL | Status: DC

## 2011-12-07 ENCOUNTER — Other Ambulatory Visit: Payer: Self-pay | Admitting: Internal Medicine

## 2011-12-07 NOTE — Telephone Encounter (Signed)
Patient needs to schedule a CPX  

## 2011-12-14 ENCOUNTER — Ambulatory Visit (INDEPENDENT_AMBULATORY_CARE_PROVIDER_SITE_OTHER): Payer: Medicare Other | Admitting: *Deleted

## 2011-12-14 ENCOUNTER — Other Ambulatory Visit: Payer: Self-pay | Admitting: Internal Medicine

## 2011-12-14 DIAGNOSIS — I4891 Unspecified atrial fibrillation: Secondary | ICD-10-CM

## 2011-12-14 DIAGNOSIS — Z7901 Long term (current) use of anticoagulants: Secondary | ICD-10-CM

## 2011-12-14 LAB — POCT INR: INR: 3.5

## 2011-12-20 ENCOUNTER — Other Ambulatory Visit: Payer: Self-pay | Admitting: Internal Medicine

## 2011-12-20 NOTE — Telephone Encounter (Signed)
Furosemide 40 mg SIG: Take 1 QD [LAST OV 02.16.12]/SLS Please advise.

## 2011-12-20 NOTE — Telephone Encounter (Signed)
He should be on frusemide 40 mg once daily; refill x3 months

## 2011-12-20 NOTE — Telephone Encounter (Signed)
Duplicate/SLS 

## 2011-12-20 NOTE — Telephone Encounter (Signed)
Done/SLS 

## 2011-12-20 NOTE — Telephone Encounter (Signed)
Duplicate/SLS

## 2012-01-06 ENCOUNTER — Other Ambulatory Visit: Payer: Self-pay | Admitting: Internal Medicine

## 2012-01-06 NOTE — Telephone Encounter (Signed)
Pt past due for CPE, will need to schedule for further refills.

## 2012-01-09 ENCOUNTER — Other Ambulatory Visit: Payer: Self-pay

## 2012-01-09 MED ORDER — LISINOPRIL 40 MG PO TABS: 40.0000 mg | ORAL_TABLET | Freq: Every day | ORAL | Status: DC

## 2012-01-09 NOTE — Telephone Encounter (Signed)
Pt scheduled for CPE in November, refilled lisinopril to last until then.

## 2012-01-11 ENCOUNTER — Ambulatory Visit (INDEPENDENT_AMBULATORY_CARE_PROVIDER_SITE_OTHER): Payer: Medicare Other | Admitting: *Deleted

## 2012-01-11 DIAGNOSIS — Z7901 Long term (current) use of anticoagulants: Secondary | ICD-10-CM

## 2012-01-11 DIAGNOSIS — I4891 Unspecified atrial fibrillation: Secondary | ICD-10-CM

## 2012-01-11 LAB — POCT INR: INR: 2.2

## 2012-02-08 ENCOUNTER — Ambulatory Visit (INDEPENDENT_AMBULATORY_CARE_PROVIDER_SITE_OTHER): Payer: Medicare Other | Admitting: *Deleted

## 2012-02-08 DIAGNOSIS — I4891 Unspecified atrial fibrillation: Secondary | ICD-10-CM

## 2012-02-08 DIAGNOSIS — Z7901 Long term (current) use of anticoagulants: Secondary | ICD-10-CM

## 2012-02-08 LAB — POCT INR: INR: 2

## 2012-02-22 ENCOUNTER — Ambulatory Visit (INDEPENDENT_AMBULATORY_CARE_PROVIDER_SITE_OTHER): Payer: Medicare Other | Admitting: Internal Medicine

## 2012-02-22 ENCOUNTER — Encounter: Payer: Self-pay | Admitting: Internal Medicine

## 2012-02-22 VITALS — BP 130/90 | HR 84 | Temp 98.0°F | Resp 12 | Ht 72.5 in | Wt 213.0 lb

## 2012-02-22 DIAGNOSIS — Z23 Encounter for immunization: Secondary | ICD-10-CM

## 2012-02-22 DIAGNOSIS — Z85828 Personal history of other malignant neoplasm of skin: Secondary | ICD-10-CM

## 2012-02-22 DIAGNOSIS — N259 Disorder resulting from impaired renal tubular function, unspecified: Secondary | ICD-10-CM

## 2012-02-22 DIAGNOSIS — I4891 Unspecified atrial fibrillation: Secondary | ICD-10-CM

## 2012-02-22 DIAGNOSIS — I10 Essential (primary) hypertension: Secondary | ICD-10-CM

## 2012-02-22 DIAGNOSIS — D489 Neoplasm of uncertain behavior, unspecified: Secondary | ICD-10-CM

## 2012-02-22 DIAGNOSIS — D649 Anemia, unspecified: Secondary | ICD-10-CM

## 2012-02-22 DIAGNOSIS — Z0000 Encounter for general adult medical examination without abnormal findings: Secondary | ICD-10-CM

## 2012-02-22 LAB — HEPATIC FUNCTION PANEL
ALT: 12 U/L (ref 0–53)
AST: 14 U/L (ref 0–37)
Albumin: 3.9 g/dL (ref 3.5–5.2)
Alkaline Phosphatase: 59 U/L (ref 39–117)
Bilirubin, Direct: 0.1 mg/dL (ref 0.0–0.3)
Total Bilirubin: 0.9 mg/dL (ref 0.3–1.2)
Total Protein: 7.4 g/dL (ref 6.0–8.3)

## 2012-02-22 LAB — BASIC METABOLIC PANEL
BUN: 23 mg/dL (ref 6–23)
CO2: 27 mEq/L (ref 19–32)
Calcium: 9 mg/dL (ref 8.4–10.5)
Chloride: 106 mEq/L (ref 96–112)
Creatinine, Ser: 1.3 mg/dL (ref 0.4–1.5)
GFR: 58.95 mL/min — ABNORMAL LOW (ref >60.00)
Glucose, Bld: 80 mg/dL (ref 70–99)
Potassium: 3.6 mEq/L (ref 3.5–5.1)
Sodium: 142 mEq/L (ref 135–145)

## 2012-02-22 LAB — LIPID PANEL
Cholesterol: 197 mg/dL (ref 0–200)
HDL: 45.8 mg/dL (ref >39.00)
LDL Cholesterol: 125 mg/dL — ABNORMAL HIGH (ref 0–99)
Total CHOL/HDL Ratio: 4
Triglycerides: 133 mg/dL (ref 0.0–149.0)
VLDL: 26.6 mg/dL (ref 0.0–40.0)

## 2012-02-22 LAB — CBC WITH DIFFERENTIAL/PLATELET
Basophils Absolute: 0 10*3/uL (ref 0.0–0.1)
Basophils Relative: 0.2 % (ref 0.0–3.0)
Eosinophils Absolute: 0.2 10*3/uL (ref 0.0–0.7)
Eosinophils Relative: 3.2 % (ref 0.0–5.0)
HCT: 46.3 % (ref 39.0–52.0)
Hemoglobin: 15.4 g/dL (ref 13.0–17.0)
Lymphocytes Relative: 12.8 % (ref 12.0–46.0)
Lymphs Abs: 1 10*3/uL (ref 0.7–4.0)
MCHC: 33.2 g/dL (ref 30.0–36.0)
MCV: 95.3 fl (ref 78.0–100.0)
Monocytes Absolute: 0.7 10*3/uL (ref 0.1–1.0)
Monocytes Relative: 9.2 % (ref 3.0–12.0)
Neutro Abs: 5.6 10*3/uL (ref 1.4–7.7)
Neutrophils Relative %: 74.6 % (ref 43.0–77.0)
Platelets: 286 10*3/uL (ref 150.0–400.0)
RBC: 4.86 Mil/uL (ref 4.22–5.81)
RDW: 14.9 % — ABNORMAL HIGH (ref 11.5–14.6)
WBC: 7.4 10*3/uL (ref 4.5–10.5)

## 2012-02-22 LAB — TSH: TSH: 0.92 u[IU]/mL (ref 0.35–5.50)

## 2012-02-22 NOTE — Patient Instructions (Addendum)
Blood Pressure Goal  Ideally is an AVERAGE < 135/85. This AVERAGE should be calculated from @ least 5-7 BP readings taken @ different times of day on different days of week. You should not respond to isolated BP readings , but rather the AVERAGE for that week  Review and correct the record as indicated. Please share record with all medical staff seen.   

## 2012-02-22 NOTE — Progress Notes (Signed)
Subjective:    Patient ID: Nathan [name], male    DOB: 13-Sep-1940, 71 y.o.   MRN: [id number]  HPI Medicare Wellness Visit:  The following psychosocial & medical history were reviewed as required by Medicare.   Social history: caffeine: 1-2 cups coffee / day , alcohol:  2/week ,  tobacco use : quit 1992  & exercise : no.   Home & personal  safety / fall risk: uses cane, activities of daily living: no limitations , seatbelt use : yes , and smoke alarm employment : yes .  Power of Attorney/Living Will status : no Living Will  Vision ( as recorded per Nurse) & Hearing  evaluation :  Ophth exam due. Orientation :oriented X 3 , memory & recall : good, spelling : good,and mood & affect :normal . Depression / anxiety: see screen Travel history : [place] 20 yrs ago , immunization status :Shingles needed , transfusion history:  no, and preventive health surveillance ( colonoscopies, BMD , etc as per protocol/ [facility]): colonoscopy 2003, Dental care:  overdue . Chart reviewed &  Updated. Active issues reviewed & addressed.       Review of Systems Blood pressure range is from 117-130/70-90. It tends to be higher in the morning. He denies chest pain, palpitations, dyspnea, edema, or claudication.  His urologist is monitoring his PSA. He's not had any updated labs since late 2011.     Objective:   Physical Exam Gen.: adequately nourished in appearance. Alert, appropriate and cooperative throughout exam. Head: Normocephalic without obvious abnormalities;  no alopecia  Eyes: No corneal or conjunctival inflammation noted. Ptosis OS> OD. Extraocular motion intact. Vision grossly normal with lenses. Ears: External  ear exam reveals no significant lesions or deformities. Canals : dense wax bilaterally. Hearing is grossly markedly decreased bilaterally. Nose: External nasal exam reveals no deformity or inflammation. Nasal mucosa are pink and moist. No lesions or exudates noted.  Mouth: Oral mucosa and  oropharynx reveal no lesions or exudates. Teeth in poor repair. Neck: No deformities, masses, or tenderness noted. Range of motion &  Thyroid normal. Lungs: Normal respiratory effort; chest expands symmetrically. Lungs are clear to auscultation without rales, wheezes, or increased work of breathing. Heart: Slow irregular rhythm.  No gallop, click, or rub. No murmur. Abdomen: Bowel sounds normal; abdomen soft and nontender. No masses, organomegaly or hernias noted. Genitalia: Dr Mena Goes                                                                 Musculoskeletal/extremities: There is some asymmetry of the posterior thoracic musculature suggesting occult scoliosis.  There is fusiform enlargement of the knees with marked crepitus and decreased range of motion. Left greater than right. He has isolated Osteoarthritic finger changes. He is using a walking cane. Pedal edema is present, left greater than right. Vascular: Carotid, radial artery, dorsalis pedis and  posterior tibial pulses are full and equal. It is slightly difficult to palpate the posterior tibial pulses due to the edema. No bruits present. Neurologic: Alert and oriented x3. Deep tendon reflexes symmetrical and normal.          Skin: There is a raised 9 x 9 mm hyperpigmented lesion on the dorsum left hand Lymph: No cervical, axillary lymphadenopathy present. Psych:  Mood and affect are normal. Normally interactive                                                                                        Assessment & Plan:  #1 Medicare Wellness Exam; criteria met ; data entered #2 blood pressure control appears to be good overall. Goals discussed #3 neoplasm dorsum left hand, rule out abnormal cytology   Plan: see Orders

## 2012-02-24 ENCOUNTER — Encounter: Payer: Self-pay | Admitting: Internal Medicine

## 2012-03-07 ENCOUNTER — Ambulatory Visit (INDEPENDENT_AMBULATORY_CARE_PROVIDER_SITE_OTHER): Payer: Medicare Other | Admitting: *Deleted

## 2012-03-07 DIAGNOSIS — Z7901 Long term (current) use of anticoagulants: Secondary | ICD-10-CM

## 2012-03-07 DIAGNOSIS — I4891 Unspecified atrial fibrillation: Secondary | ICD-10-CM

## 2012-03-07 LAB — POCT INR: INR: 1.9

## 2012-03-26 ENCOUNTER — Other Ambulatory Visit: Payer: Self-pay | Admitting: *Deleted

## 2012-03-26 DIAGNOSIS — I10 Essential (primary) hypertension: Secondary | ICD-10-CM

## 2012-03-26 MED ORDER — DILTIAZEM HCL ER COATED BEADS 360 MG PO CP24: 360.0000 mg | ORAL_CAPSULE | Freq: Every day | ORAL | Status: DC

## 2012-03-28 ENCOUNTER — Ambulatory Visit (INDEPENDENT_AMBULATORY_CARE_PROVIDER_SITE_OTHER): Payer: Medicare Other | Admitting: Internal Medicine

## 2012-03-28 ENCOUNTER — Ambulatory Visit (INDEPENDENT_AMBULATORY_CARE_PROVIDER_SITE_OTHER): Payer: Medicare Other | Admitting: *Deleted

## 2012-03-28 ENCOUNTER — Encounter: Payer: Self-pay | Admitting: Internal Medicine

## 2012-03-28 VITALS — BP 136/90 | HR 56 | Ht 74.0 in | Wt 216.0 lb

## 2012-03-28 DIAGNOSIS — I10 Essential (primary) hypertension: Secondary | ICD-10-CM

## 2012-03-28 DIAGNOSIS — I4891 Unspecified atrial fibrillation: Secondary | ICD-10-CM

## 2012-03-28 DIAGNOSIS — Z7901 Long term (current) use of anticoagulants: Secondary | ICD-10-CM

## 2012-03-28 LAB — POCT INR: INR: 3.3

## 2012-03-28 NOTE — Progress Notes (Signed)
ELECTROPHYSIOLOGY OFFICE NOTE  Patient ID: Nathan [name] MRN: [id number], DOB/AGE: 05-29-1940   Date of Visit: 03/28/2012  Primary Physician: Marga Melnick, MD Primary Cardiologist: Johney Frame, MD Reason for Visit: Follow-up atrial fibrillation  History of Present Illness  Nathan [name] is a pleasant 71 year old gentleman with permanent AFib, HTN and renal insufficiency who presents today for routine electrophysiology followup.  Since last being seen in our clinic, he reports doing well. Today, he denies symptoms of palpitations, chest pain, shortness of breath, orthopnea, PND, lower extremity edema, dizziness, presyncope or syncope. He feels he is tolerating medications without difficulties and is without complaint today.   Past Medical History  Diagnosis Date  . Permanent atrial fibrillation   . Hypertension   . Chronic renal impairment   . DJD (degenerative joint disease)   . Diverticulosis   . Elevated PSA     Dr Mena Goes  . Infected prosthetic knee joint     h/o Group B streptococcal prosthetic knee infection    Past Surgical History  Procedure Date  . Total knee arthroplasty   . Total hip arthroplasty   . Colonoscopy 2003  . Hiatal hernia repair     Dr Wenda Low , laparoscopic     Allergies/Intolerances No Known Allergies  Current Home Medications Current Outpatient Prescriptions  Medication Sig Dispense Refill  . diltiazem (CARDIZEM CD) 360 MG 24 hr capsule Take 1 capsule (360 mg total) by mouth daily.  30 capsule  1  . finasteride (PROSCAR) 5 MG tablet Take 5 mg by mouth daily. Rx'ed by [name]      . furosemide (LASIX) 40 MG tablet Take 1 tablet (40 mg total) by mouth daily.  90 tablet  2  . lisinopril (PRINIVIL,ZESTRIL) 40 MG tablet Take 1 tablet (40 mg total) by mouth daily.  65 tablet  0  . metoprolol (LOPRESSOR) 100 MG tablet Take 1 tablet (100 mg total) by mouth 2 (two) times daily.  180 tablet  1  . warfarin (COUMADIN) 5 MG tablet Take as directed by  Coumadin Clinic  30 tablet  3  . [DISCONTINUED] furosemide (LASIX) 40 MG tablet TAKE 1 TABLET BY MOUTH EVERY DAY  30 tablet  3    Social History History   Social History  . Marital Status: Single    Spouse Name: N/A    Number of Children: N/A  . Years of Education: N/A   Occupational History  . Not on file.   Social History Main Topics  . Smoking status: Former Smoker    Quit date: 04/17/1991  . Smokeless tobacco: Never Used  . Alcohol Use: 1.2 oz/week    2 Glasses of wine per week     Comment: occasionally  . Drug Use: No  . Sexually Active: Not Currently   Other Topics Concern  . 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  Physical Exam Blood pressure 136/90, pulse 56, height 6\' 2"  (1.88 m), weight 216 lb (97.977 kg).  General: Well developed, well  appearing 71 year old male in no acute distress. Walks very slowly with a cane HEENT: Normocephalic, atraumatic. EOMs intact. Sclera nonicteric. Oropharynx clear.  Neck: Supple. No JVD. Lungs: Respirations regular and unlabored, CTA bilaterally. No wheezes, rales or rhonchi. Heart: Irregularly irregular S1, S2 present. No murmurs, rub, S3 or S4. Abdomen: Soft, non-distended.  Extremities: No clubbing, cyanosis or edema. DP/PT/Radials 2+ and equal bilaterally. Psych: Normal affect. Neuro: Alert and oriented X 3. Moves all extremities spontaneously.    Diagnostics 12-lead ECG shows atrial fibrillation, rate controlled at 56 bpm; no ST-T wave abnormalities; QRS 94; QT/QTc 430/414   Assessment and Plan 1.  Permanent atrial fibrillation Stable No change required today  2. HTN Stable No change required today

## 2012-03-28 NOTE — Patient Instructions (Signed)
Your physician wants you to follow-up in: 6 months with Rick Duff, PA You will receive a reminder letter in the mail two months in advance. If you don't receive a letter, please call our office to schedule the follow-up appointment.  Your physician wants you to follow-up in: 12 months with Dr Jacquiline Doe will receive a reminder letter in the mail two months in advance. If you don't receive a letter, please call our office to schedule the follow-up appointment.

## 2012-04-04 ENCOUNTER — Other Ambulatory Visit: Payer: Self-pay | Admitting: Internal Medicine

## 2012-04-09 ENCOUNTER — Telehealth: Payer: Self-pay | Admitting: Internal Medicine

## 2012-04-09 MED ORDER — METOPROLOL TARTRATE 100 MG PO TABS: 100.0000 mg | ORAL_TABLET | Freq: Two times a day (BID) | ORAL | Status: DC

## 2012-04-09 NOTE — Telephone Encounter (Signed)
Metoprolol 100mg  requested last week [facility] [address], pt now out can get asap today?

## 2012-04-18 ENCOUNTER — Other Ambulatory Visit: Payer: Self-pay | Admitting: Internal Medicine

## 2012-05-02 ENCOUNTER — Other Ambulatory Visit: Payer: Self-pay | Admitting: *Deleted

## 2012-05-02 MED ORDER — WARFARIN SODIUM 5 MG PO TABS: ORAL_TABLET | ORAL | Status: DC

## 2012-05-04 ENCOUNTER — Ambulatory Visit (INDEPENDENT_AMBULATORY_CARE_PROVIDER_SITE_OTHER): Payer: Medicare Other | Admitting: *Deleted

## 2012-05-04 DIAGNOSIS — I4891 Unspecified atrial fibrillation: Secondary | ICD-10-CM

## 2012-05-04 DIAGNOSIS — Z7901 Long term (current) use of anticoagulants: Secondary | ICD-10-CM

## 2012-05-04 LAB — POCT INR: INR: 2.2

## 2012-05-17 ENCOUNTER — Other Ambulatory Visit: Payer: Self-pay | Admitting: Emergency Medicine

## 2012-05-17 DIAGNOSIS — I10 Essential (primary) hypertension: Secondary | ICD-10-CM

## 2012-05-17 MED ORDER — DILTIAZEM HCL ER COATED BEADS 360 MG PO CP24: 360.0000 mg | ORAL_CAPSULE | Freq: Every day | ORAL | Status: DC

## 2012-06-08 ENCOUNTER — Ambulatory Visit (INDEPENDENT_AMBULATORY_CARE_PROVIDER_SITE_OTHER): Payer: Medicare Other | Admitting: *Deleted

## 2012-06-08 DIAGNOSIS — I4891 Unspecified atrial fibrillation: Secondary | ICD-10-CM

## 2012-06-08 DIAGNOSIS — Z7901 Long term (current) use of anticoagulants: Secondary | ICD-10-CM

## 2012-06-08 LAB — POCT INR: INR: 4.4

## 2012-07-02 ENCOUNTER — Ambulatory Visit (INDEPENDENT_AMBULATORY_CARE_PROVIDER_SITE_OTHER): Payer: Medicare Other | Admitting: Pharmacist

## 2012-07-02 DIAGNOSIS — I4891 Unspecified atrial fibrillation: Secondary | ICD-10-CM

## 2012-07-02 DIAGNOSIS — Z7901 Long term (current) use of anticoagulants: Secondary | ICD-10-CM

## 2012-07-02 LAB — POCT INR: INR: 4.3

## 2012-07-23 ENCOUNTER — Ambulatory Visit (INDEPENDENT_AMBULATORY_CARE_PROVIDER_SITE_OTHER): Payer: Medicare Other

## 2012-07-23 DIAGNOSIS — I4891 Unspecified atrial fibrillation: Secondary | ICD-10-CM

## 2012-07-23 DIAGNOSIS — Z7901 Long term (current) use of anticoagulants: Secondary | ICD-10-CM

## 2012-07-23 LAB — POCT INR: INR: 3.1

## 2012-08-24 ENCOUNTER — Ambulatory Visit (INDEPENDENT_AMBULATORY_CARE_PROVIDER_SITE_OTHER): Payer: Medicare Other

## 2012-08-24 DIAGNOSIS — I4891 Unspecified atrial fibrillation: Secondary | ICD-10-CM

## 2012-08-24 DIAGNOSIS — Z7901 Long term (current) use of anticoagulants: Secondary | ICD-10-CM

## 2012-08-24 LAB — POCT INR: INR: 2

## 2012-09-21 ENCOUNTER — Ambulatory Visit (INDEPENDENT_AMBULATORY_CARE_PROVIDER_SITE_OTHER): Payer: Medicare Other | Admitting: *Deleted

## 2012-09-21 DIAGNOSIS — Z7901 Long term (current) use of anticoagulants: Secondary | ICD-10-CM

## 2012-09-21 DIAGNOSIS — I4891 Unspecified atrial fibrillation: Secondary | ICD-10-CM

## 2012-09-21 LAB — POCT INR: INR: 3.2

## 2012-10-03 ENCOUNTER — Other Ambulatory Visit: Payer: Self-pay | Admitting: Internal Medicine

## 2012-10-14 ENCOUNTER — Other Ambulatory Visit: Payer: Self-pay | Admitting: Internal Medicine

## 2012-10-15 NOTE — Telephone Encounter (Signed)
Refill done.  

## 2012-10-16 ENCOUNTER — Other Ambulatory Visit: Payer: Self-pay | Admitting: Internal Medicine

## 2012-10-23 ENCOUNTER — Ambulatory Visit (INDEPENDENT_AMBULATORY_CARE_PROVIDER_SITE_OTHER): Payer: Medicare Other | Admitting: *Deleted

## 2012-10-23 DIAGNOSIS — Z7901 Long term (current) use of anticoagulants: Secondary | ICD-10-CM

## 2012-10-23 DIAGNOSIS — I4891 Unspecified atrial fibrillation: Secondary | ICD-10-CM

## 2012-10-23 LAB — POCT INR: INR: 2.7

## 2012-11-12 ENCOUNTER — Other Ambulatory Visit: Payer: Self-pay | Admitting: Internal Medicine

## 2012-11-15 ENCOUNTER — Telehealth: Payer: Self-pay | Admitting: Internal Medicine

## 2012-11-15 NOTE — Telephone Encounter (Signed)
New problem    Pt calling about stopping coumadin for a procedure and still hasn't heard from anyone

## 2012-11-15 NOTE — Telephone Encounter (Signed)
Waiting on fax.  Spoke with patient and let him know I have not received anything

## 2012-11-16 ENCOUNTER — Encounter: Payer: Self-pay | Admitting: Internal Medicine

## 2012-11-16 ENCOUNTER — Ambulatory Visit (INDEPENDENT_AMBULATORY_CARE_PROVIDER_SITE_OTHER): Payer: Medicare Other | Admitting: Internal Medicine

## 2012-11-16 DIAGNOSIS — E559 Vitamin D deficiency, unspecified: Secondary | ICD-10-CM

## 2012-11-16 DIAGNOSIS — M109 Gout, unspecified: Secondary | ICD-10-CM

## 2012-11-16 NOTE — Patient Instructions (Addendum)
Avoid trauma to the elbow; consider wearing an elbow support athletes wear if there is risk of repetitive injury  to this area. 

## 2012-11-16 NOTE — Progress Notes (Signed)
  Subjective:    Patient ID: Nathan [name], male    DOB: 1940-09-29, 72 y.o.   MRN: [id number]  HPI  Approximately one month ago he developed pain and swelling in the left elbow greater than the right after several hours of pressure in these areas against the metal arms of the chair.  He saw Dr. Cleophas Dunker who apparently aspirated the bursa on the left revealing gout  The pain has been up to level V but has now essentially resolved.  He denies any history of gout; but review of the chart indicates the uric acid was 7.6 in 2007. His mother & brother had classic podagra. He is not on hydrochlorothiazide. Significantly he is on warfarin. He drinks 3-4 drinks on weekend.No specific diet.      Review of Systems He denies any pain in the feet or great toes.  His vitamin D level was 28 in the year 2011.       Objective:   Physical Exam  He appears adequately nourished and in no distress  No scleral icterus is present  He has no lymphadenopathy about the neck, axilla, or epitrochlear areas.  He does have DIP degenerative joint changes in hands. There is a small left elbow bursal effusion & suggestion of osteoma formation over ulna. There is no change in color or temperature of the skin and there is no tenderness of this area to palpation or light percussion.  He does have fusiform changes the knees with decreased range of motion.          Assessment & Plan:  #1 apparent gout left olecranon bursa based on recent aspiration by Dr. Cleophas Dunker. There is no evidence of active inflammation at this time. He does have a past history of elevated uric acid. He is not a candidate for indomethacin because of his warfarin therapy. Uric acid level will be checked. He'll be asked to avoid repetitive trauma to this area.  #2 prior vitamin D deficiency

## 2012-11-16 NOTE — Addendum Note (Signed)
Addended by: Silvio Pate D on: 11/16/2012 02:56 PM   Modules accepted: Orders

## 2012-11-17 ENCOUNTER — Other Ambulatory Visit: Payer: Self-pay | Admitting: Internal Medicine

## 2012-11-17 DIAGNOSIS — M109 Gout, unspecified: Secondary | ICD-10-CM

## 2012-11-17 LAB — URIC ACID: Uric Acid, Serum: 8.9 mg/dL — ABNORMAL HIGH (ref 4.0–7.8)

## 2012-11-17 MED ORDER — ALLOPURINOL 100 MG PO TABS: 100.0000 mg | ORAL_TABLET | Freq: Every day | ORAL | Status: DC

## 2012-11-19 ENCOUNTER — Telehealth: Payer: Self-pay | Admitting: Internal Medicine

## 2012-11-19 NOTE — Telephone Encounter (Signed)
F/u   Pt following up on fax-dr newton's office stated they sent fax again

## 2012-11-19 NOTE — Telephone Encounter (Signed)
Received request from Nurse, documents faxed for surgical clearance. To: [employer] Fax number: [phone] Attention:Dr.Newton Scheduling  11/19/12/Km

## 2012-11-19 NOTE — Telephone Encounter (Signed)
Faxed back to office today.  Patient aware

## 2012-11-20 ENCOUNTER — Ambulatory Visit (INDEPENDENT_AMBULATORY_CARE_PROVIDER_SITE_OTHER): Payer: Medicare Other | Admitting: *Deleted

## 2012-11-20 ENCOUNTER — Telehealth: Payer: Self-pay | Admitting: Internal Medicine

## 2012-11-20 DIAGNOSIS — Z7901 Long term (current) use of anticoagulants: Secondary | ICD-10-CM

## 2012-11-20 DIAGNOSIS — I4891 Unspecified atrial fibrillation: Secondary | ICD-10-CM

## 2012-11-20 LAB — VITAMIN D 1,25 DIHYDROXY
Vitamin D 1, 25 (OH)2 Total: 33 pg/mL (ref 18–72)
Vitamin D2 1, 25 (OH)2: <8 pg/mL
Vitamin D3 1, 25 (OH)2: 33 pg/mL

## 2012-11-20 LAB — POCT INR: INR: 2.2

## 2012-11-20 NOTE — Telephone Encounter (Addendum)
Will have Kim re-fax today.  Patient aware

## 2012-11-20 NOTE — Telephone Encounter (Signed)
New Prob  Pt states he needs to get a shot in his back but he needs approval from Dr Johney Frame.

## 2012-11-21 ENCOUNTER — Other Ambulatory Visit: Payer: Self-pay | Admitting: Internal Medicine

## 2012-12-05 ENCOUNTER — Ambulatory Visit (INDEPENDENT_AMBULATORY_CARE_PROVIDER_SITE_OTHER): Payer: Medicare Other | Admitting: *Deleted

## 2012-12-05 DIAGNOSIS — Z7901 Long term (current) use of anticoagulants: Secondary | ICD-10-CM

## 2012-12-05 DIAGNOSIS — I4891 Unspecified atrial fibrillation: Secondary | ICD-10-CM

## 2012-12-05 LAB — POCT INR: INR: 2

## 2013-01-15 ENCOUNTER — Encounter: Payer: Self-pay | Admitting: Internal Medicine

## 2013-01-15 ENCOUNTER — Ambulatory Visit (INDEPENDENT_AMBULATORY_CARE_PROVIDER_SITE_OTHER): Payer: Medicare Other | Admitting: Internal Medicine

## 2013-01-15 VITALS — BP 141/90 | HR 89 | Temp 98.0°F | Wt 210.2 lb

## 2013-01-15 DIAGNOSIS — K4090 Unilateral inguinal hernia, without obstruction or gangrene, not specified as recurrent: Secondary | ICD-10-CM

## 2013-01-15 NOTE — Progress Notes (Signed)
  Subjective:    Patient ID: Nathan [name], male    DOB: [birth year], 72 y.o.   MRN: [id number]  HPI   Symptoms been present for a couple months as intermittent burning pain in the left inguinal area upon standing. There was no injury or trigger for this such as heavy lifting.  Although he has significant back problems; this has not been associated with back pain with anterior radiation of discomfort. ESI did not help      Review of Systems  He denies associated fever, chills, sweats,rash  or weight loss  There is no radicular pain, numbness, tingling, weakness in his legs.  He has no incontinence of urine and stool  He also denies dysuria, pyuria, or hematuria.     Objective:   Physical Exam Gen.: well-nourished in appearance. Alert, appropriate and cooperative throughout exam.  Abdomen: Bowel sounds normal; abdomen soft and nontender. No masses, organomegaly or hernias noted. Genitalia: Genitalia normal except for vasectomy scar tissue & left varices. Indirect L inguinal hernia. Musculoskeletal/extremities: No deformity or scoliosis noted of  the thoracic or lumbar spine.   No clubbing, cyanosis, or edema noted. Range of motion normal .Tone & strength  Normal. Joints  reveal mild  Mixed PIP &  DIP changes. Fusiform enlargement left knee with crepitus. Able to lie down & sit up w/o help. Negative SLR bilaterally to 90 surprisingly  Neurologic: Alert and oriented x3. Deep tendon reflexes symmetrical and normal.  Using cane; moves deliberately.        Skin: Intact without suspicious lesions or rashes. Lymph: No cervical, axillary, or inguinal lymphadenopathy present. Psych: Mood and affect are normal. Normally interactive                                                                                        Assessment & Plan:  #1 indirect left inguinal hernia  Plan: See orders

## 2013-01-15 NOTE — Patient Instructions (Addendum)
I recommend a General Surgery consultation to determine optimal therapy.

## 2013-01-16 ENCOUNTER — Ambulatory Visit (INDEPENDENT_AMBULATORY_CARE_PROVIDER_SITE_OTHER): Payer: Medicare Other | Admitting: *Deleted

## 2013-01-16 DIAGNOSIS — Z7901 Long term (current) use of anticoagulants: Secondary | ICD-10-CM

## 2013-01-16 DIAGNOSIS — I4891 Unspecified atrial fibrillation: Secondary | ICD-10-CM

## 2013-01-16 LAB — POCT INR: INR: 1.6

## 2013-01-21 ENCOUNTER — Ambulatory Visit (INDEPENDENT_AMBULATORY_CARE_PROVIDER_SITE_OTHER): Payer: Medicare Other | Admitting: Surgery

## 2013-01-21 ENCOUNTER — Encounter (INDEPENDENT_AMBULATORY_CARE_PROVIDER_SITE_OTHER): Payer: Self-pay | Admitting: Surgery

## 2013-01-21 VITALS — BP 130/78 | HR 72 | Temp 97.5°F | Resp 18 | Ht 74.0 in | Wt 212.6 lb

## 2013-01-21 DIAGNOSIS — K4090 Unilateral inguinal hernia, without obstruction or gangrene, not specified as recurrent: Secondary | ICD-10-CM

## 2013-01-21 NOTE — Progress Notes (Signed)
Patient ID: Nathan [name], male   DOB: [birth year], 72 y.o.   MRN: [id number]  Chief Complaint  Patient presents with  . New Evaluation    eval RIH    HPI Nathan [name] is a 72 y.o. male.  Patient sent at request of Dr. Alwyn Ren for left inguinal hernia. He is having left groin pain. Pain made worse with exertion. There is a bulge in his left groin. Patient describes pain as burning sensation. Moderate to severe intensity. No nausea or vomiting. HPI  Past Medical History  Diagnosis Date  . Permanent atrial fibrillation   . Hypertension   . Chronic renal impairment   . DJD (degenerative joint disease)   . Diverticulosis   . Elevated PSA     Dr Mena Goes  . Infected prosthetic knee joint     h/o Group B streptococcal prosthetic knee infection  . Cancer     skin    Past Surgical History  Procedure Laterality Date  . Total knee arthroplasty    . Total hip arthroplasty    . Colonoscopy  2003  . Hiatal hernia repair      Dr Wenda Low , laparoscopic  . Hernia repair      Family History  Problem Relation Age of Onset  . Hypertension Mother   . Skin cancer Mother   . Brain cancer Sister   . Hypertension Brother   . Diabetes Brother   . Stroke Neg Hx   . Heart disease Neg Hx     Social History History  Substance Use Topics  . Smoking status: Former Smoker    Quit date: 04/17/1991  . Smokeless tobacco: Never Used  . Alcohol Use: 1.2 oz/week    2 Glasses of wine per week     Comment: occasionally    No Known Allergies  Current Outpatient Prescriptions  Medication Sig Dispense Refill  . allopurinol (ZYLOPRIM) 100 MG tablet Take 1 tablet (100 mg total) by mouth daily.  30 tablet  6  . diltiazem (TIAZAC) 360 MG 24 hr capsule TAKE ONE CAPSULE BY MOUTH EVERY DAY  30 capsule  5  . finasteride (PROSCAR) 5 MG tablet Take 5 mg by mouth daily. Rx'ed by [name]      . furosemide (LASIX) 40 MG tablet Take 1 tablet (40 mg total) by mouth daily.  90 tablet  2  .  lisinopril (PRINIVIL,ZESTRIL) 40 MG tablet Take 1 tablet (40 mg total) by mouth daily.  65 tablet  0  . metoprolol (LOPRESSOR) 100 MG tablet TAKE 1 TABLET BY MOUTH TWICE A DAY  180 tablet  3  . warfarin (COUMADIN) 5 MG tablet TAKE AS DIRECTED BY COUMADIN CLINIC  30 tablet  3  . [DISCONTINUED] diltiazem (CARDIZEM CD) 360 MG 24 hr capsule Take 1 capsule (360 mg total) by mouth daily.  30 capsule  5   No current facility-administered medications for this visit.    Review of Systems Review of Systems  Constitutional: Negative.   HENT: Negative.   Eyes: Negative.   Respiratory: Negative.   Cardiovascular: Positive for palpitations.  Gastrointestinal: Negative.   Endocrine: Negative.   Genitourinary: Positive for difficulty urinating.  Musculoskeletal: Positive for back pain, arthralgias and gait problem.  Skin: Negative.   Allergic/Immunologic: Negative.   Neurological: Negative.   Hematological: Negative.   Psychiatric/Behavioral: Negative.     Blood pressure 130/78, pulse 72, temperature 97.5 F (36.4 C), temperature source Temporal, resp. rate 18, height 6\' 2"  (1.88 m), weight  212 lb 9.6 oz (96.435 kg).  Physical Exam Physical Exam  Constitutional: He is oriented to person, place, and time. He appears well-developed and well-nourished.  HENT:  Head: Normocephalic and atraumatic.  Eyes: Pupils are equal, round, and reactive to light. No scleral icterus.  Neck: Normal range of motion. Neck supple.  Cardiovascular: An irregularly irregular rhythm present.  Musculoskeletal: Normal range of motion.  Lymphadenopathy:    He has no cervical adenopathy.  Neurological: He is alert and oriented to person, place, and time.  Skin: Skin is warm and dry.  Psychiatric: He has a normal mood and affect. His behavior is normal. Judgment and thought content normal.    Data Reviewed Dr Alwyn Ren notes  Assessment    Symptomatic left anal hernia reducible  Atrial fibrillation on  Coumadin  Osteoarthritis severe  Chronic renal insufficiency    Plan    Recommend repair of left inguinal hernia the mesh due to pain. Recommended holding Coumadin 5 days prior to procedure. Risks, benefits and alternative therapies discussed. Surgical and nonsurgical options discussed. Patient agrees to proceed.The risk of hernia repair include bleeding,  Infection,   Recurrence of the hernia,  Mesh use, chronic pain,  Organ injury,  Bowel injury,  Bladder injury,   nerve injury with numbness around the incision,  Death,  and worsening of preexisting  medical problems.  The alternatives to surgery have been discussed as well..  Long term expectations of both operative and non operative treatments have been discussed.   The patient agrees to proceed.       [name] 01/21/2013, 3:59 PM

## 2013-01-21 NOTE — Patient Instructions (Signed)

## 2013-02-19 ENCOUNTER — Ambulatory Visit (INDEPENDENT_AMBULATORY_CARE_PROVIDER_SITE_OTHER): Payer: Medicare Other

## 2013-02-19 DIAGNOSIS — Z23 Encounter for immunization: Secondary | ICD-10-CM

## 2013-02-21 ENCOUNTER — Ambulatory Visit (INDEPENDENT_AMBULATORY_CARE_PROVIDER_SITE_OTHER): Payer: Medicare Other | Admitting: *Deleted

## 2013-02-21 DIAGNOSIS — I4891 Unspecified atrial fibrillation: Secondary | ICD-10-CM

## 2013-02-21 DIAGNOSIS — Z7901 Long term (current) use of anticoagulants: Secondary | ICD-10-CM

## 2013-02-21 LAB — POCT INR: INR: 1.7

## 2013-02-22 ENCOUNTER — Telehealth: Payer: Self-pay | Admitting: *Deleted

## 2013-02-22 NOTE — Telephone Encounter (Signed)
Pt states he is scheduled for left inguinal hernia repair on the 18th and he forgot to inform us on his coumadin visit and is to hold coumadin for 5 days prior. Note sent to Dr Johney Frame  to obtain cardiac clearance . Pt instructed that we will call him regarding this when clearance is obtained from Dr Johney Frame and he states understanding.

## 2013-02-25 ENCOUNTER — Encounter (HOSPITAL_COMMUNITY): Payer: Self-pay

## 2013-02-25 ENCOUNTER — Encounter (HOSPITAL_COMMUNITY)
Admission: RE | Admit: 2013-02-25 | Discharge: 2013-02-25 | Disposition: A | Discharge status: Home / Self Care | Payer: Medicare Other | Source: Ambulatory Visit | Attending: Surgery | Admitting: Surgery

## 2013-02-25 ENCOUNTER — Ambulatory Visit (HOSPITAL_COMMUNITY)
Admission: RE | Admit: 2013-02-25 | Discharge: 2013-02-25 | Disposition: A | Discharge status: Home / Self Care | Payer: Medicare Other | Source: Ambulatory Visit | Attending: Anesthesiology | Admitting: Anesthesiology

## 2013-02-25 DIAGNOSIS — K449 Diaphragmatic hernia without obstruction or gangrene: Secondary | ICD-10-CM | POA: Insufficient documentation

## 2013-02-25 DIAGNOSIS — Z01812 Encounter for preprocedural laboratory examination: Secondary | ICD-10-CM | POA: Insufficient documentation

## 2013-02-25 DIAGNOSIS — Z01818 Encounter for other preprocedural examination: Secondary | ICD-10-CM | POA: Insufficient documentation

## 2013-02-25 DIAGNOSIS — Z01810 Encounter for preprocedural cardiovascular examination: Secondary | ICD-10-CM | POA: Insufficient documentation

## 2013-02-25 HISTORY — DX: Other specified postprocedural states: Z98.890

## 2013-02-25 HISTORY — DX: Other specified postprocedural states: R11.2

## 2013-02-25 HISTORY — DX: Personal history of other diseases of the digestive system: Z87.19

## 2013-02-25 LAB — CBC WITH DIFFERENTIAL/PLATELET
Basophils Absolute: 0 10*3/uL (ref 0.0–0.1)
Basophils Relative: 0 % (ref 0–1)
Eosinophils Absolute: 0.4 10*3/uL (ref 0.0–0.7)
Eosinophils Relative: 4 % (ref 0–5)
HCT: 43.4 % (ref 39.0–52.0)
Hemoglobin: 14.5 g/dL (ref 13.0–17.0)
Lymphocytes Relative: 16 % (ref 12–46)
Lymphs Abs: 1.5 10*3/uL (ref 0.7–4.0)
MCH: 32.2 pg (ref 26.0–34.0)
MCHC: 33.4 g/dL (ref 30.0–36.0)
MCV: 96.4 fL (ref 78.0–100.0)
Monocytes Absolute: 0.7 10*3/uL (ref 0.1–1.0)
Monocytes Relative: 8 % (ref 3–12)
Neutro Abs: 6.5 10*3/uL (ref 1.7–7.7)
Neutrophils Relative %: 71 % (ref 43–77)
Platelets: 267 10*3/uL (ref 150–400)
RBC: 4.5 MIL/uL (ref 4.22–5.81)
RDW: 14.7 % (ref 11.5–15.5)
WBC: 9.1 10*3/uL (ref 4.0–10.5)

## 2013-02-25 LAB — COMPREHENSIVE METABOLIC PANEL WITH GFR
ALT: 11 U/L (ref 0–53)
AST: 15 U/L (ref 0–37)
Albumin: 3.8 g/dL (ref 3.5–5.2)
Alkaline Phosphatase: 71 U/L (ref 39–117)
BUN: 29 mg/dL — ABNORMAL HIGH (ref 6–23)
CO2: 26 meq/L (ref 19–32)
Calcium: 9.4 mg/dL (ref 8.4–10.5)
Chloride: 107 meq/L (ref 96–112)
Creatinine, Ser: 1.44 mg/dL — ABNORMAL HIGH (ref 0.50–1.35)
GFR calc Af Amer: 54 mL/min — ABNORMAL LOW (ref >90)
GFR calc non Af Amer: 46 mL/min — ABNORMAL LOW (ref >90)
Glucose, Bld: 80 mg/dL (ref 70–99)
Potassium: 4.2 meq/L (ref 3.5–5.1)
Sodium: 144 meq/L (ref 135–145)
Total Bilirubin: 0.5 mg/dL (ref 0.3–1.2)
Total Protein: 7.2 g/dL (ref 6.0–8.3)

## 2013-02-25 LAB — PROTIME-INR
INR: 1.94 — ABNORMAL HIGH (ref 0.00–1.49)
Prothrombin Time: 21.6 seconds — ABNORMAL HIGH (ref 11.6–15.2)

## 2013-02-25 LAB — APTT: aPTT: 38 seconds — ABNORMAL HIGH (ref 24–37)

## 2013-02-25 NOTE — Progress Notes (Signed)
Note from rn in epic working on cardiac clearance

## 2013-02-25 NOTE — Pre-Procedure Instructions (Addendum)
Nathan [name] [id number]  02/25/2013   Your procedure is scheduled on:  03/05/13  Report to [facility] short stay admitting at  530AM.  Call this number if you have problems the morning of surgery: [phone]   Remember:   Do not eat food or drink liquids after midnight.   Take these medicines the morning of surgery with A SIP OF WATER: diltiazem, allopurinol, metoprolol               STOP coumadin per dr             [name] all herbel meds, nsaids (aleve,naproxen,advil,ibuprofen) 5 days prior to surgery   Do not wear jewelry, make-up or nail polish.  Do not wear lotions, powders, or perfumes. You may wear deodorant.  Do not shave 48 hours prior to surgery. Men may shave face and neck.  Do not bring valuables to the hospital.  [facility] is not responsible                  for any belongings or valuables.               Contacts, dentures or bridgework may not be worn into surgery.  Leave suitcase in the car. After surgery it may be brought to your room.  For patients admitted to the hospital, discharge time is determined by your                treatment team.               Patients discharged the day of surgery will not be allowed to drive  home.  Name and phone number of your driver:   Special Instructions: Shower using CHG 2 nights before surgery and the night before surgery.  If you shower the day of surgery use CHG.  Use special wash - you have one bottle of CHG for all showers.  You should use approximately 1/3 of the bottle for each shower.   Please read over the following fact sheets that you were given: Pain Booklet, Coughing and Deep Breathing and Surgical Site Infection Prevention

## 2013-02-26 ENCOUNTER — Encounter (HOSPITAL_COMMUNITY): Payer: Self-pay

## 2013-02-26 ENCOUNTER — Telehealth: Payer: Self-pay | Admitting: *Deleted

## 2013-02-26 ENCOUNTER — Telehealth (INDEPENDENT_AMBULATORY_CARE_PROVIDER_SITE_OTHER): Payer: Self-pay

## 2013-02-26 NOTE — Telephone Encounter (Signed)
Talked with Arline Asp at Dr [facility] office and informed that had obtained cardiac clearance for Nathan [name] that Dr Johney Frame had approved his being off coumadin for 5 days prior to procedure but that should resume immediately after surgery.

## 2013-02-26 NOTE — Telephone Encounter (Signed)
Message copied by Jeannine Kitten on Tue Feb 26, 2013  9:30 AM ------      Message from: Hillis Range      Created: Fri Feb 22, 2013 10:42 PM       Hold coumadin for 5 days prior to surgery and resume immediately afterwards.                        ----- Message -----         From: Jeannine Kitten, RN         Sent: 02/22/2013   2:25 PM           To: Hillis Range, MD, Deliah Boston, RN            Pt called today stating that he is scheduled for left inguinal hernia repair on November 18th and that Dr Luisa Hart wanted him off coumadin for 5 days prior Please advise      Thank you      Lelon Perla RN        ------

## 2013-02-26 NOTE — Progress Notes (Signed)
Anesthesia Chart Review:  Patient is a 72 year old male scheduled for repair of left inguinal hernia on 03/05/13 by Dr. Luisa Hart.  History includes former smoker, post-operative N/V, afib, HTN, CKD, hiatal hernia with history of repair, right TKA with Group B strep infection, rightTHA, skin cancer, diverticuolosis. PCP is Dr. Marga Melnick. Cardiologist is Dr. Johney Frame who is aware of plans for surgery and gave permission to hold Coumadin 5 days preoperatively.  EKG on 02/25/13 showed afib @ 73 bpm, LAD, cannot rule out anterior infarct (age undetermined). R wave is lower in V3, otherwise appears stable since 03/28/12.  Echo on 05/13/09 showed: - Left ventricle: The cavity size was normal. Wall thickness was increased in a pattern of mild LVH. Systolic function was normal. The estimated ejection fraction was in the range of 55% to 60%. Wall motion was normal; there were no regional wall motion abnormalities. - Mitral valve: Calcified annulus. Trivial regurgitation. - Left atrium: The atrium was moderately dilated. - Right ventricle: The cavity size was mildly dilated. - Right atrium: The atrium was moderately dilated. - Pulmonary arteries: PA peak pressure: 32mm Hg (S). - Pericardium, extracardiac: A small pericardial effusion was identified.  CXR on 02/25/13 showed chronic hiatal hernia, no acute cardiopulmonary abnormality. Small chronic calcified granuloma in the LLL.  Preoperative labs noted.  BUN/Cr 29/1.44 (up from 23/1.3 02/22/12).  PT/PTT increased.  Plan to recheck PT/INR on the day of surgery.  If follow-up lab results are reasonable and otherwise no acute changes then I would anticipate that he could proceed as planned.  Velna Ochs [facility] Short Stay Center/Anesthesiology Phone [phone] 02/26/2013 6:47 PM

## 2013-02-26 NOTE — Telephone Encounter (Signed)
Talked with pt and instructed last day to take coumadin is 02/27/2013 and that he is to hold coumadin for the next 5 days and then day of surgery 03/05/2013  after surgery ask Dr Latricia Heft when to restart coumadin and when instructed to restart coumadin take extra 1/2 tablet for 2 days and made appt for recheck in 1 week and pt states understanding of instructions given.

## 2013-02-26 NOTE — Telephone Encounter (Signed)
Lelon Perla from Dr Jenel Lucks office called to advise not is in epic under phone encounter giving pt ok to D/C coumadin 5 days prior to surgery. I advised her I will send this msg to Dr Cornett's assistant to review.

## 2013-03-04 MED ORDER — CEFAZOLIN SODIUM-DEXTROSE 2-3 GM-% IV SOLR
2.0000 g | INTRAVENOUS | Status: AC
  Administered 2013-03-05: 2 g via INTRAVENOUS
  Filled 2013-03-04: qty 50

## 2013-03-05 ENCOUNTER — Ambulatory Visit (HOSPITAL_COMMUNITY)
Admission: RE | Admit: 2013-03-05 | Discharge: 2013-03-05 | Disposition: A | Discharge status: Home / Self Care | Payer: Medicare Other | Source: Ambulatory Visit | Attending: Surgery | Admitting: Surgery

## 2013-03-05 ENCOUNTER — Encounter (HOSPITAL_COMMUNITY): Payer: Medicare Other | Admitting: Vascular Surgery

## 2013-03-05 ENCOUNTER — Encounter (HOSPITAL_COMMUNITY): Payer: Self-pay

## 2013-03-05 ENCOUNTER — Ambulatory Visit (HOSPITAL_COMMUNITY): Payer: Medicare Other | Admitting: Anesthesiology

## 2013-03-05 ENCOUNTER — Encounter (HOSPITAL_COMMUNITY)
Admission: RE | Disposition: A | Discharge status: Home / Self Care | Payer: Self-pay | Source: Ambulatory Visit | Attending: Surgery

## 2013-03-05 DIAGNOSIS — Z85828 Personal history of other malignant neoplasm of skin: Secondary | ICD-10-CM

## 2013-03-05 DIAGNOSIS — K4090 Unilateral inguinal hernia, without obstruction or gangrene, not specified as recurrent: Secondary | ICD-10-CM | POA: Insufficient documentation

## 2013-03-05 DIAGNOSIS — M1990 Unspecified osteoarthritis, unspecified site: Secondary | ICD-10-CM

## 2013-03-05 DIAGNOSIS — I4891 Unspecified atrial fibrillation: Secondary | ICD-10-CM

## 2013-03-05 DIAGNOSIS — B951 Streptococcus, group B, as the cause of diseases classified elsewhere: Secondary | ICD-10-CM

## 2013-03-05 DIAGNOSIS — K5730 Diverticulosis of large intestine without perforation or abscess without bleeding: Secondary | ICD-10-CM

## 2013-03-05 DIAGNOSIS — I129 Hypertensive chronic kidney disease with stage 1 through stage 4 chronic kidney disease, or unspecified chronic kidney disease: Secondary | ICD-10-CM | POA: Insufficient documentation

## 2013-03-05 DIAGNOSIS — N259 Disorder resulting from impaired renal tubular function, unspecified: Secondary | ICD-10-CM

## 2013-03-05 DIAGNOSIS — D649 Anemia, unspecified: Secondary | ICD-10-CM

## 2013-03-05 DIAGNOSIS — R972 Elevated prostate specific antigen [PSA]: Secondary | ICD-10-CM | POA: Insufficient documentation

## 2013-03-05 DIAGNOSIS — E559 Vitamin D deficiency, unspecified: Secondary | ICD-10-CM

## 2013-03-05 DIAGNOSIS — I10 Essential (primary) hypertension: Secondary | ICD-10-CM

## 2013-03-05 DIAGNOSIS — N281 Cyst of kidney, acquired: Secondary | ICD-10-CM

## 2013-03-05 DIAGNOSIS — Z87891 Personal history of nicotine dependence: Secondary | ICD-10-CM | POA: Insufficient documentation

## 2013-03-05 DIAGNOSIS — N189 Chronic kidney disease, unspecified: Secondary | ICD-10-CM | POA: Insufficient documentation

## 2013-03-05 HISTORY — PX: INSERTION OF MESH: SHX5868

## 2013-03-05 HISTORY — PX: INGUINAL HERNIA REPAIR: SHX194

## 2013-03-05 LAB — APTT: aPTT: 31 seconds (ref 24–37)

## 2013-03-05 LAB — PROTIME-INR
INR: 1.17 (ref 0.00–1.49)
Prothrombin Time: 14.7 seconds (ref 11.6–15.2)

## 2013-03-05 SURGERY — REPAIR, HERNIA, INGUINAL, ADULT
Anesthesia: General | Site: Groin | Laterality: Left | Wound class: Clean

## 2013-03-05 MED ORDER — CHLORHEXIDINE GLUCONATE 4 % EX LIQD: 1.0000 "application " | Freq: Once | CUTANEOUS | Status: DC

## 2013-03-05 MED ORDER — DROPERIDOL 2.5 MG/ML IJ SOLN: 0.6250 mg | INTRAMUSCULAR | Status: DC | PRN

## 2013-03-05 MED ORDER — FENTANYL CITRATE 0.05 MG/ML IJ SOLN
INTRAMUSCULAR | Status: DC | PRN
  Administered 2013-03-05: 50 ug via INTRAVENOUS
  Administered 2013-03-05: 100 ug via INTRAVENOUS

## 2013-03-05 MED ORDER — LACTATED RINGERS IV SOLN
INTRAVENOUS | Status: DC | PRN
  Administered 2013-03-05 (×2): via INTRAVENOUS

## 2013-03-05 MED ORDER — 0.9 % SODIUM CHLORIDE (POUR BTL) OPTIME
TOPICAL | Status: DC | PRN
  Administered 2013-03-05: 1000 mL

## 2013-03-05 MED ORDER — FENTANYL CITRATE 0.05 MG/ML IJ SOLN: 25.0000 ug | INTRAMUSCULAR | Status: DC | PRN

## 2013-03-05 MED ORDER — PROPOFOL 10 MG/ML IV BOLUS
INTRAVENOUS | Status: DC | PRN
  Administered 2013-03-05: 150 mg via INTRAVENOUS

## 2013-03-05 MED ORDER — LIDOCAINE HCL (CARDIAC) 20 MG/ML IV SOLN
INTRAVENOUS | Status: DC | PRN
  Administered 2013-03-05: 40 mg via INTRAVENOUS

## 2013-03-05 MED ORDER — OXYCODONE-ACETAMINOPHEN 5-325 MG PO TABS: 1.0000 | ORAL_TABLET | ORAL | Status: DC | PRN

## 2013-03-05 MED ORDER — PHENYLEPHRINE HCL 10 MG/ML IJ SOLN
10.0000 mg | INTRAVENOUS | Status: DC | PRN
  Administered 2013-03-05: 40 ug/min via INTRAVENOUS

## 2013-03-05 MED ORDER — BUPIVACAINE-EPINEPHRINE PF 0.25-1:200000 % IJ SOLN
INTRAMUSCULAR | Status: AC
  Filled 2013-03-05: qty 30

## 2013-03-05 MED ORDER — BUPIVACAINE-EPINEPHRINE 0.25% -1:200000 IJ SOLN
INTRAMUSCULAR | Status: DC | PRN
  Administered 2013-03-05: 30 mL

## 2013-03-05 MED ORDER — ONDANSETRON HCL 4 MG/2ML IJ SOLN
INTRAMUSCULAR | Status: DC | PRN
  Administered 2013-03-05: 4 mg via INTRAVENOUS

## 2013-03-05 SURGICAL SUPPLY — 53 items
ADH SKN CLS APL DERMABOND .7 (GAUZE/BANDAGES/DRESSINGS) ×1
BLADE SURG 10 STRL SS (BLADE) ×2 IMPLANT
BLADE SURG 15 STRL LF DISP TIS (BLADE) ×1 IMPLANT
BLADE SURG 15 STRL SS (BLADE) ×2
BLADE SURG ROTATE 9660 (MISCELLANEOUS) IMPLANT
CANISTER SUCTION 2500CC (MISCELLANEOUS) ×1 IMPLANT
CHLORAPREP W/TINT 26ML (MISCELLANEOUS) ×2 IMPLANT
COVER SURGICAL LIGHT HANDLE (MISCELLANEOUS) ×2 IMPLANT
DECANTER SPIKE VIAL GLASS SM (MISCELLANEOUS) ×2 IMPLANT
DERMABOND ADVANCED (GAUZE/BANDAGES/DRESSINGS) ×1
DERMABOND ADVANCED .7 DNX12 (GAUZE/BANDAGES/DRESSINGS) ×1 IMPLANT
DRAIN PENROSE 1/2X12 LTX STRL (WOUND CARE) ×1 IMPLANT
DRAPE LAPAROTOMY TRNSV 102X78 (DRAPE) ×2 IMPLANT
DRAPE UTILITY 15X26 W/TAPE STR (DRAPE) ×4 IMPLANT
ELECT CAUTERY BLADE 6.4 (BLADE) ×2 IMPLANT
ELECT REM PT RETURN 9FT ADLT (ELECTROSURGICAL) ×2
ELECTRODE REM PT RTRN 9FT ADLT (ELECTROSURGICAL) ×1 IMPLANT
GLOVE BIO SURGEON STRL SZ 6.5 (GLOVE) ×1 IMPLANT
GLOVE BIO SURGEON STRL SZ8 (GLOVE) ×2 IMPLANT
GLOVE BIOGEL PI IND STRL 7.0 (GLOVE) IMPLANT
GLOVE BIOGEL PI IND STRL 8 (GLOVE) ×1 IMPLANT
GLOVE BIOGEL PI INDICATOR 7.0 (GLOVE) ×2
GLOVE BIOGEL PI INDICATOR 8 (GLOVE) ×1
GLOVE SURG SS PI 7.0 STRL IVOR (GLOVE) ×1 IMPLANT
GOWN STRL NON-REIN LRG LVL3 (GOWN DISPOSABLE) ×4 IMPLANT
GOWN STRL REIN XL XLG (GOWN DISPOSABLE) ×2 IMPLANT
KIT BASIN OR (CUSTOM PROCEDURE TRAY) ×2 IMPLANT
KIT ROOM TURNOVER OR (KITS) ×2 IMPLANT
MESH HERNIA SYS ULTRAPRO LRG (Mesh General) ×1 IMPLANT
NDL HYPO 25GX1X1/2 BEV (NEEDLE) ×1 IMPLANT
NEEDLE HYPO 25GX1X1/2 BEV (NEEDLE) ×2 IMPLANT
NS IRRIG 1000ML POUR BTL (IV SOLUTION) ×2 IMPLANT
PACK SURGICAL SETUP 50X90 (CUSTOM PROCEDURE TRAY) ×2 IMPLANT
PAD ARMBOARD 7.5X6 YLW CONV (MISCELLANEOUS) ×2 IMPLANT
PENCIL BUTTON HOLSTER BLD 10FT (ELECTRODE) ×2 IMPLANT
SPONGE LAP 18X18 X RAY DECT (DISPOSABLE) ×2 IMPLANT
SUT MNCRL AB 4-0 PS2 18 (SUTURE) ×2 IMPLANT
SUT NOVA 0 T19/GS 22DT (SUTURE) ×3 IMPLANT
SUT NOVA NAB DX-16 0-1 5-0 T12 (SUTURE) ×3 IMPLANT
SUT SILK 2 0 SH (SUTURE) IMPLANT
SUT VIC AB 0 CT1 27 (SUTURE) ×2
SUT VIC AB 0 CT1 27XBRD ANBCTR (SUTURE) IMPLANT
SUT VIC AB 2-0 SH 27 (SUTURE)
SUT VIC AB 2-0 SH 27X BRD (SUTURE) ×1 IMPLANT
SUT VIC AB 3-0 SH 18 (SUTURE) ×3 IMPLANT
SUT VICRYL AB 3 0 TIES (SUTURE) ×2 IMPLANT
SYR BULB 3OZ (MISCELLANEOUS) ×1 IMPLANT
SYR CONTROL 10ML LL (SYRINGE) ×2 IMPLANT
TOWEL OR 17X24 6PK STRL BLUE (TOWEL DISPOSABLE) ×2 IMPLANT
TOWEL OR 17X26 10 PK STRL BLUE (TOWEL DISPOSABLE) ×2 IMPLANT
TUBE CONNECTING 12X1/4 (SUCTIONS) ×1 IMPLANT
WATER STERILE IRR 1000ML POUR (IV SOLUTION) IMPLANT
YANKAUER SUCT BULB TIP NO VENT (SUCTIONS) ×1 IMPLANT

## 2013-03-05 NOTE — H&P (Signed)
Nathan [name]  01/21/2013 3:30 PM   Office Visit  MRN:  [id number]   Description: 72 year old male  Provider: Clovis Pu. Terren Jandreau, MD  Department: [facility]        Diagnoses    Inguinal hernia    -  Primary    550.90    left      Reason for Visit    New Evaluation    eval RIH        Current Vitals - Last Recorded    BP Pulse Temp(Src) Resp Ht Wt    130/78 72 97.5 F (36.4 C) (Temporal) 18 6\' 2"  (1.88 m) 212 lb 9.6 oz (96.435 kg)       BMI              27.28 kg/m2                 Progress Notes    Azlyn Wingler A. Bhavin Monjaraz, MD at 01/21/2013  3:59 PM    Status: Signed            Patient ID: Nathan [name], male   DOB: 31-Jul-1940, 72 y.o.   MRN: [id number]    Chief Complaint   Patient presents with   .  New Evaluation       eval RIH        HPI Nathan [name] is a 72 y.o. male.  Patient sent at request of Dr. Alwyn Ren for left inguinal hernia. He is having left groin pain. Pain made worse with exertion. There is a bulge in his left groin. Patient describes pain as burning sensation. Moderate to severe intensity. No nausea or vomiting. HPI    Past Medical History   Diagnosis  Date   .  Permanent atrial fibrillation     .  Hypertension     .  Chronic renal impairment     .  DJD (degenerative joint disease)     .  Diverticulosis     .  Elevated PSA         Dr Mena Goes   .  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  Cancer         skin         Past Surgical History   Procedure  Laterality  Date   .  Total knee arthroplasty       .  Total hip arthroplasty       .  Colonoscopy    2003   .  Hiatal hernia repair           Dr Wenda Low , laparoscopic   .  Hernia repair             Family History   Problem  Relation  Age of Onset   .  Hypertension  Mother     .  Skin cancer  Mother     .  Brain cancer  Sister     .  Hypertension  Brother     .  Diabetes  Brother     .  Stroke  Neg Hx      .  Heart disease  Neg Hx          Social History History   Substance Use Topics   .  Smoking status:  Former Smoker       Quit date:  04/17/1991   .  Smokeless tobacco:  Never Used   .  Alcohol Use:  1.2 oz/week       2 Glasses of wine per week         Comment: occasionally        No Known Allergies    Current Outpatient Prescriptions   Medication  Sig  Dispense  Refill   .  allopurinol (ZYLOPRIM) 100 MG tablet  Take 1 tablet (100 mg total) by mouth daily.   30 tablet   6   .  diltiazem (TIAZAC) 360 MG 24 hr capsule  TAKE ONE CAPSULE BY MOUTH EVERY DAY   30 capsule   5   .  finasteride (PROSCAR) 5 MG tablet  Take 5 mg by mouth daily. Rx'ed by [name]         .  furosemide (LASIX) 40 MG tablet  Take 1 tablet (40 mg total) by mouth daily.   90 tablet   2   .  lisinopril (PRINIVIL,ZESTRIL) 40 MG tablet  Take 1 tablet (40 mg total) by mouth daily.   65 tablet   0   .  metoprolol (LOPRESSOR) 100 MG tablet  TAKE 1 TABLET BY MOUTH TWICE A DAY   180 tablet   3   .  warfarin (COUMADIN) 5 MG tablet  TAKE AS DIRECTED BY COUMADIN CLINIC   30 tablet   3   .  [DISCONTINUED] diltiazem (CARDIZEM CD) 360 MG 24 hr capsule  Take 1 capsule (360 mg total) by mouth daily.   30 capsule   5       No current facility-administered medications for this visit.        Review of Systems Review of Systems  Constitutional: Negative.   HENT: Negative.   Eyes: Negative.   Respiratory: Negative.   Cardiovascular: Positive for palpitations.  Gastrointestinal: Negative.   Endocrine: Negative.   Genitourinary: Positive for difficulty urinating.  Musculoskeletal: Positive for back pain, arthralgias and gait problem.  Skin: Negative.   Allergic/Immunologic: Negative.   Neurological: Negative.   Hematological: Negative.   Psychiatric/Behavioral: Negative.       Blood pressure 130/78, pulse 72, temperature 97.5 F (36.4 C), temperature source Temporal, resp. rate 18, height 6\' 2"  (1.88 m),  weight 212 lb 9.6 oz (96.435 kg).   Physical Exam Physical Exam  Constitutional: He is oriented to person, place, and time. He appears well-developed and well-nourished.  HENT:   Head: Normocephalic and atraumatic.  Eyes: Pupils are equal, round, and reactive to light. No scleral icterus.  Neck: Normal range of motion. Neck supple.  Cardiovascular: An irregularly irregular rhythm present.  Musculoskeletal: Normal range of motion.  Lymphadenopathy:    He has no cervical adenopathy.  Neurological: He is alert and oriented to person, place, and time.  Skin: Skin is warm and dry.  Psychiatric: He has a normal mood and affect. His behavior is normal. Judgment and thought content normal.   Abdomen:  Left inguinal hernia reducible moderate size   Data Reviewed Dr Alwyn Ren notes   Assessment    Symptomatic left anal hernia reducible   Atrial fibrillation on Coumadin   Osteoarthritis severe   Chronic renal insufficiency     Plan    Recommend repair of left inguinal hernia the mesh due to pain. Recommended holding Coumadin 5 days prior to procedure. Risks, benefits and alternative therapies discussed. Surgical and nonsurgical options discussed. Patient agrees to proceed.The risk of hernia repair include bleeding,  Infection,   Recurrence of  the hernia,  Mesh use, chronic pain,  Organ injury,  Bowel injury,  Bladder injury,   nerve injury with numbness around the incision,  Death,  and worsening of preexisting  medical problems.  The alternatives to surgery have been discussed as well..  Long term expectations of both operative and non operative treatments have been discussed.   The patient agrees to proceed.          Dawnya Grams A.

## 2013-03-05 NOTE — Op Note (Signed)
Left Inguinal Hernia, Open, Procedure Note with mesh   Indications: The patient presented with a history of a left, reducible inguinal hernia.  The risk of hernia repair include bleeding,  Infection,   Recurrence of the hernia,  Mesh use, chronic pain,  Organ injury,  Bowel injury,  Bladder injury,   nerve injury with numbness around the incision,  Death,  and worsening of preexisting  medical problems.  The alternatives to surgery have been discussed as well..  Long term expectations of both operative and non operative treatments have been discussed.   The patient agrees to proceed.  Pre-operative Diagnosis: left reducible inguinal  hernia  Post-operative Diagnosis: same  Surgeon: Harriette Bouillon A.   Assistants: OR staff  Anesthesia: General LMA anesthesia and Local anesthesia 0.25.% bupivacaine, with epinephrine  ASA Class: 3  Procedure Details  The patient was seen again in the Holding Room. The risks, benefits, complications, treatment options, and expected outcomes were discussed with the patient. The possibilities of reaction to medication, pulmonary aspiration, perforation of viscus, bleeding, recurrent infection, the need for additional procedures, and development of a complication requiring transfusion or further operation were discussed with the patient and/or family. There was concurrence with the proposed plan, and informed consent was obtained. The site of surgery was properly noted/marked. The patient was taken to the Operating Room, identified as Treshun Wold [id number], and the procedure verified as hernia repair. A Time Out was held and the above information confirmed.  The patient was placed in the supine position and underwent induction of anesthesia, the lower abdomen and groin was prepped and draped in the standard fashion, and 0.25% Marcaine with epinephrine was used to anesthetize the skin over the mid-portion of the left  inguinal canal. A transverse incision was made. Dissection  was carried through the soft tissue to expose the inguinal canal and inguinal ligament along its lower edge. The external oblique fascia was split along the course of its fibers, exposing the inguinal canal. The cord and nerve were looped using a Penrose drain and reflected out of the field. The defect was exposed and a piece of prolene hernia system ultrapro mesh was and placed into  the indirect defect. Interupted 1-0 novafil suture  And 0 Vicryl was then used  to repair the defect, with the suture being sewn from the pubic tubercle inferiorly and superiorly along the canal to a level just beyond the internal ring. The mesh was split to allow passage of the cord and into the canal without entrapment. The ilioinguinal and iliohypogastric nerve branches divided to prevent entrapment.  The contents were then returned to  The canal and the external oblique fashion was then closed in a continuous fashion using 3-0 Vicryl suture taking care not to cause entrapment. Scarpa's layer closed with 3 0 vicryl and 4 0 monocryl used to close the skin.  Dermabond used for dressing.  Instrument, sponge, and needle counts were correct prior to closure and at the conclusion of the case.  Findings: Hernia as above  Estimated Blood Loss: Minimal         Drains: None         Total IV Fluids: 500 mL         Specimens: none               Complications: None; patient tolerated the procedure well.         Disposition: PACU - hemodynamically stable.         Condition: stable

## 2013-03-05 NOTE — Interval H&P Note (Signed)
History and Physical Interval Note:  03/05/2013 6:58 AM  Nathan [name]  has presented today for surgery, with the diagnosis of hernia  The various methods of treatment have been discussed with the patient and family. After consideration of risks, benefits and other options for treatment, the patient has consented to  Procedure(s): HERNIA REPAIR INGUINAL ADULT (Left) INSERTION OF MESH (Left) as a surgical intervention .  The patient's history has been reviewed, patient examined, no change in status, stable for surgery.  I have reviewed the patient's chart and labs.  Questions were answered to the patient's satisfaction.     Idan Prime A.

## 2013-03-05 NOTE — Anesthesia Preprocedure Evaluation (Addendum)
Anesthesia Evaluation  Patient identified by MRN, date of birth, ID band Patient awake    Reviewed: Allergy & Precautions, H&P , NPO status , Patient's Chart, lab work & pertinent test results, reviewed documented beta blocker date and time   History of Anesthesia Complications (+) PONV and history of anesthetic complications  Airway Mallampati: II      Dental  (+) Poor Dentition, Dental Advisory Given and Missing   Pulmonary former smoker,  Smoked very little but quit 30 years ago breath sounds clear to auscultation  Pulmonary exam normal       Cardiovascular hypertension, Pt. on medications and Pt. on home beta blockers + dysrhythmias (INR 1.17) Atrial Fibrillation Rhythm:Irregular Rate:Normal  '11 ECHO: EF 55-60, valves OK   Neuro/Psych negative neurological ROS  negative psych ROS   GI/Hepatic negative GI ROS, Neg liver ROS, HH repaired 10 years ago.   Endo/Other  negative endocrine ROS  Renal/GU Renal InsufficiencyRenal disease (creat 1.44)     Musculoskeletal   Abdominal   Peds  Hematology   Anesthesia Other Findings   Reproductive/Obstetrics                         Anesthesia Physical Anesthesia Plan  ASA: III  Anesthesia Plan: General   Post-op Pain Management:    Induction: Intravenous  Airway Management Planned: LMA  Additional Equipment:   Intra-op Plan:   Post-operative Plan: Extubation in OR  Informed Consent: I have reviewed the patients History and Physical, chart, labs and discussed the procedure including the risks, benefits and alternatives for the proposed anesthesia with the patient or authorized representative who has indicated his/her understanding and acceptance.   Dental advisory given  Plan Discussed with: CRNA, Anesthesiologist and Surgeon  Anesthesia Plan Comments: (Plan routine monitors, GA- LMA OK)       Anesthesia Quick Evaluation

## 2013-03-05 NOTE — Anesthesia Postprocedure Evaluation (Signed)
  Anesthesia Post-op Note  Patient: Nathan [name]  Procedure(s) Performed: Procedure(s): HERNIA REPAIR INGUINAL ADULT (Left) INSERTION OF MESH (Left)  Patient Location: PACU  Anesthesia Type:General  Level of Consciousness: awake, alert , oriented and patient cooperative  Airway and Oxygen Therapy: Patient Spontanous Breathing  Post-op Pain: mild  Post-op Assessment: Post-op Vital signs reviewed, Patient's Cardiovascular Status Stable, Respiratory Function Stable, Patent Airway, No signs of Nausea or vomiting and Pain level controlled  Post-op Vital Signs: Reviewed and stable  Complications: No apparent anesthesia complications

## 2013-03-05 NOTE — Transfer of Care (Signed)
Immediate Anesthesia Transfer of Care Note  Patient: Nathan [name]  Procedure(s) Performed: Procedure(s): HERNIA REPAIR INGUINAL ADULT (Left) INSERTION OF MESH (Left)  Patient Location: PACU  Anesthesia Type:General  Level of Consciousness: awake, alert , oriented and patient cooperative  Airway & Oxygen Therapy: Patient Spontanous Breathing and Patient connected to nasal cannula oxygen  Post-op Assessment: Report given to PACU RN, Post -op Vital signs reviewed and stable and Patient moving all extremities  Post vital signs: Reviewed and stable  Complications: No apparent anesthesia complications

## 2013-03-05 NOTE — Anesthesia Procedure Notes (Signed)
Procedure Name: LMA Insertion Date/Time: 03/05/2013 7:34 AM Performed by: Coralee Rud Pre-anesthesia Checklist: Patient identified, Emergency Drugs available, Suction available, Patient being monitored and Timeout performed Patient Re-evaluated:Patient Re-evaluated prior to inductionOxygen Delivery Method: Circle system utilized Preoxygenation: Pre-oxygenation with 100% oxygen Intubation Type: IV induction LMA: LMA inserted LMA Size: 5.0 Number of attempts: 2 (Repositioned after initial placement) Tube secured with: Tape Dental Injury: Teeth and Oropharynx as per pre-operative assessment

## 2013-03-05 NOTE — Preoperative (Signed)
Beta Blockers   Reason not to administer Beta Blockers:BB this AM

## 2013-03-06 ENCOUNTER — Telehealth (INDEPENDENT_AMBULATORY_CARE_PROVIDER_SITE_OTHER): Payer: Self-pay | Admitting: *Deleted

## 2013-03-06 NOTE — Telephone Encounter (Signed)
LMOM for pt to return my call.  I was calling to check on pt postoperatively and to inform him of his post op appt with Dr. Luisa Hart on 03/22/13 at 10:10am.

## 2013-03-08 ENCOUNTER — Encounter (HOSPITAL_COMMUNITY): Payer: Self-pay | Admitting: Surgery

## 2013-03-12 ENCOUNTER — Ambulatory Visit (INDEPENDENT_AMBULATORY_CARE_PROVIDER_SITE_OTHER): Payer: Medicare Other | Admitting: *Deleted

## 2013-03-12 DIAGNOSIS — Z7901 Long term (current) use of anticoagulants: Secondary | ICD-10-CM

## 2013-03-12 DIAGNOSIS — I4891 Unspecified atrial fibrillation: Secondary | ICD-10-CM

## 2013-03-12 LAB — POCT INR: INR: 1.5

## 2013-03-19 ENCOUNTER — Other Ambulatory Visit: Payer: Self-pay | Admitting: Internal Medicine

## 2013-03-22 ENCOUNTER — Encounter (INDEPENDENT_AMBULATORY_CARE_PROVIDER_SITE_OTHER): Payer: Self-pay | Admitting: Surgery

## 2013-03-22 ENCOUNTER — Ambulatory Visit (INDEPENDENT_AMBULATORY_CARE_PROVIDER_SITE_OTHER): Payer: Medicare Other | Admitting: Surgery

## 2013-03-22 VITALS — BP 142/90 | HR 72 | Temp 99.1°F | Resp 15 | Ht 74.0 in | Wt 215.2 lb

## 2013-03-22 DIAGNOSIS — Z9889 Other specified postprocedural states: Secondary | ICD-10-CM

## 2013-03-22 NOTE — Patient Instructions (Signed)
RETURN AS NEEDED.  RESUME FULL ACTIVITY.  

## 2013-03-22 NOTE — Progress Notes (Signed)
Pt returns today after LEFT INGUINAL  hernia repair WITH MESH.  Pain is well controlled.  Bowels are functioning.  Wound is clean.  On exam:  Incision is clean /dry/intact.  Area is soft without signs of hernia recurrence.  Impression:  Status repair of hernia LEFT INGUINAL   Plan:  RTC PRN  RESUME FULL ACTIVITY.

## 2013-04-01 ENCOUNTER — Ambulatory Visit (INDEPENDENT_AMBULATORY_CARE_PROVIDER_SITE_OTHER): Payer: Medicare Other | Admitting: *Deleted

## 2013-04-01 ENCOUNTER — Encounter: Payer: Self-pay | Admitting: Internal Medicine

## 2013-04-01 ENCOUNTER — Ambulatory Visit (INDEPENDENT_AMBULATORY_CARE_PROVIDER_SITE_OTHER): Payer: Medicare Other | Admitting: Internal Medicine

## 2013-04-01 VITALS — BP 128/84 | HR 76 | Ht 74.0 in | Wt 213.8 lb

## 2013-04-01 DIAGNOSIS — I4891 Unspecified atrial fibrillation: Secondary | ICD-10-CM

## 2013-04-01 DIAGNOSIS — I10 Essential (primary) hypertension: Secondary | ICD-10-CM

## 2013-04-01 DIAGNOSIS — Z7901 Long term (current) use of anticoagulants: Secondary | ICD-10-CM

## 2013-04-01 LAB — HEPATIC FUNCTION PANEL
ALT: 11 U/L (ref 0–53)
AST: 14 U/L (ref 0–37)
Albumin: 4.1 g/dL (ref 3.5–5.2)
Alkaline Phosphatase: 59 U/L (ref 39–117)
Bilirubin, Direct: 0 mg/dL (ref 0.0–0.3)
Total Bilirubin: 0.9 mg/dL (ref 0.3–1.2)
Total Protein: 7.2 g/dL (ref 6.0–8.3)

## 2013-04-01 LAB — LIPID PANEL
Cholesterol: 176 mg/dL (ref 0–200)
HDL: 45.8 mg/dL (ref >39.00)
LDL Cholesterol: 100 mg/dL — ABNORMAL HIGH (ref 0–99)
Total CHOL/HDL Ratio: 4
Triglycerides: 152 mg/dL — ABNORMAL HIGH (ref 0.0–149.0)
VLDL: 30.4 mg/dL (ref 0.0–40.0)

## 2013-04-01 LAB — BASIC METABOLIC PANEL
BUN: 31 mg/dL — ABNORMAL HIGH (ref 6–23)
CO2: 28 mEq/L (ref 19–32)
Calcium: 9.2 mg/dL (ref 8.4–10.5)
Chloride: 107 mEq/L (ref 96–112)
Creatinine, Ser: 1.5 mg/dL (ref 0.4–1.5)
GFR: 47.76 mL/min — ABNORMAL LOW (ref >60.00)
Glucose, Bld: 82 mg/dL (ref 70–99)
Potassium: 4.1 mEq/L (ref 3.5–5.1)
Sodium: 144 mEq/L (ref 135–145)

## 2013-04-01 LAB — POCT INR: INR: 2.3

## 2013-04-01 NOTE — Patient Instructions (Signed)
Your physician recommends that you return for lab work today: LFTs, BMET, Lipid profile  Your physician wants you to follow-up in: 6 months with Norma Fredrickson, NP.  You will receive a reminder letter in the mail two months in advance. If you don't receive a letter, please call our office to schedule the follow-up appointment.  Your physician wants you to follow-up in: 1 year with Dr. Johney Frame.  You will receive a reminder letter in the mail two months in advance. If you don't receive a letter, please call our office to schedule the follow-up appointment.
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
## 2013-04-19 ENCOUNTER — Other Ambulatory Visit: Payer: Self-pay | Admitting: *Deleted

## 2013-04-19 MED ORDER — LISINOPRIL 40 MG PO TABS: 40.0000 mg | ORAL_TABLET | Freq: Every day | ORAL | Status: DC

## 2013-04-22 ENCOUNTER — Ambulatory Visit (INDEPENDENT_AMBULATORY_CARE_PROVIDER_SITE_OTHER): Payer: Medicare Other | Admitting: *Deleted

## 2013-04-22 DIAGNOSIS — Z7901 Long term (current) use of anticoagulants: Secondary | ICD-10-CM

## 2013-04-22 DIAGNOSIS — I4891 Unspecified atrial fibrillation: Secondary | ICD-10-CM

## 2013-04-22 LAB — POCT INR: INR: 2.1

## 2013-05-21 ENCOUNTER — Other Ambulatory Visit: Payer: Self-pay | Admitting: Internal Medicine

## 2013-05-22 ENCOUNTER — Ambulatory Visit (INDEPENDENT_AMBULATORY_CARE_PROVIDER_SITE_OTHER): Payer: Medicare Other | Admitting: *Deleted

## 2013-05-22 DIAGNOSIS — Z7901 Long term (current) use of anticoagulants: Secondary | ICD-10-CM

## 2013-05-22 DIAGNOSIS — Z5181 Encounter for therapeutic drug level monitoring: Secondary | ICD-10-CM

## 2013-05-22 DIAGNOSIS — I4891 Unspecified atrial fibrillation: Secondary | ICD-10-CM

## 2013-05-22 LAB — POCT INR: INR: 3.1

## 2013-06-15 ENCOUNTER — Other Ambulatory Visit: Payer: Self-pay | Admitting: Internal Medicine

## 2013-06-18 ENCOUNTER — Other Ambulatory Visit: Payer: Self-pay | Admitting: Internal Medicine

## 2013-06-19 ENCOUNTER — Ambulatory Visit (INDEPENDENT_AMBULATORY_CARE_PROVIDER_SITE_OTHER): Payer: Medicare Other | Admitting: *Deleted

## 2013-06-19 DIAGNOSIS — I4891 Unspecified atrial fibrillation: Secondary | ICD-10-CM

## 2013-06-19 DIAGNOSIS — Z7901 Long term (current) use of anticoagulants: Secondary | ICD-10-CM

## 2013-06-19 DIAGNOSIS — Z5181 Encounter for therapeutic drug level monitoring: Secondary | ICD-10-CM

## 2013-06-19 LAB — POCT INR: INR: 2.4

## 2013-07-15 ENCOUNTER — Other Ambulatory Visit: Payer: Self-pay | Admitting: Internal Medicine

## 2013-07-31 ENCOUNTER — Ambulatory Visit (INDEPENDENT_AMBULATORY_CARE_PROVIDER_SITE_OTHER): Payer: Medicare Other | Admitting: *Deleted

## 2013-07-31 DIAGNOSIS — Z5181 Encounter for therapeutic drug level monitoring: Secondary | ICD-10-CM

## 2013-07-31 DIAGNOSIS — I4891 Unspecified atrial fibrillation: Secondary | ICD-10-CM

## 2013-07-31 DIAGNOSIS — Z7901 Long term (current) use of anticoagulants: Secondary | ICD-10-CM

## 2013-07-31 LAB — POCT INR: INR: 2.6

## 2013-09-02 ENCOUNTER — Other Ambulatory Visit: Payer: Self-pay | Admitting: Internal Medicine

## 2013-09-03 ENCOUNTER — Other Ambulatory Visit: Payer: Self-pay | Admitting: Internal Medicine

## 2013-09-11 ENCOUNTER — Ambulatory Visit (INDEPENDENT_AMBULATORY_CARE_PROVIDER_SITE_OTHER): Payer: Medicare Other | Admitting: *Deleted

## 2013-09-11 DIAGNOSIS — I4891 Unspecified atrial fibrillation: Secondary | ICD-10-CM

## 2013-09-11 DIAGNOSIS — Z7901 Long term (current) use of anticoagulants: Secondary | ICD-10-CM

## 2013-09-11 DIAGNOSIS — Z5181 Encounter for therapeutic drug level monitoring: Secondary | ICD-10-CM

## 2013-09-11 LAB — POCT INR: INR: 2.1

## 2013-10-14 ENCOUNTER — Other Ambulatory Visit: Payer: Self-pay

## 2013-10-14 ENCOUNTER — Other Ambulatory Visit: Payer: Self-pay | Admitting: Internal Medicine

## 2013-10-14 MED ORDER — LISINOPRIL 40 MG PO TABS: 40.0000 mg | ORAL_TABLET | Freq: Every day | ORAL | Status: DC

## 2013-10-23 ENCOUNTER — Ambulatory Visit (INDEPENDENT_AMBULATORY_CARE_PROVIDER_SITE_OTHER): Payer: Medicare Other | Admitting: *Deleted

## 2013-10-23 DIAGNOSIS — I4891 Unspecified atrial fibrillation: Secondary | ICD-10-CM

## 2013-10-23 DIAGNOSIS — Z7901 Long term (current) use of anticoagulants: Secondary | ICD-10-CM

## 2013-10-23 DIAGNOSIS — Z5181 Encounter for therapeutic drug level monitoring: Secondary | ICD-10-CM

## 2013-10-23 LAB — POCT INR: INR: 3.4

## 2013-11-15 ENCOUNTER — Other Ambulatory Visit: Payer: Self-pay | Admitting: Internal Medicine

## 2013-11-20 ENCOUNTER — Ambulatory Visit (INDEPENDENT_AMBULATORY_CARE_PROVIDER_SITE_OTHER): Payer: Medicare Other | Admitting: Pharmacist

## 2013-11-20 DIAGNOSIS — Z7901 Long term (current) use of anticoagulants: Secondary | ICD-10-CM

## 2013-11-20 DIAGNOSIS — Z5181 Encounter for therapeutic drug level monitoring: Secondary | ICD-10-CM

## 2013-11-20 DIAGNOSIS — I4891 Unspecified atrial fibrillation: Secondary | ICD-10-CM

## 2013-11-20 LAB — POCT INR: INR: 3

## 2013-12-18 ENCOUNTER — Ambulatory Visit (INDEPENDENT_AMBULATORY_CARE_PROVIDER_SITE_OTHER): Payer: Medicare Other | Admitting: *Deleted

## 2013-12-18 DIAGNOSIS — I4891 Unspecified atrial fibrillation: Secondary | ICD-10-CM

## 2013-12-18 DIAGNOSIS — Z5181 Encounter for therapeutic drug level monitoring: Secondary | ICD-10-CM

## 2013-12-18 DIAGNOSIS — Z7901 Long term (current) use of anticoagulants: Secondary | ICD-10-CM

## 2013-12-18 LAB — POCT INR: INR: 2.3

## 2014-01-13 ENCOUNTER — Other Ambulatory Visit: Payer: Self-pay

## 2014-01-13 MED ORDER — FUROSEMIDE 40 MG PO TABS: 40.0000 mg | ORAL_TABLET | Freq: Every day | ORAL | Status: DC

## 2014-01-13 MED ORDER — ALLOPURINOL 100 MG PO TABS: ORAL_TABLET | ORAL | Status: DC

## 2014-01-22 ENCOUNTER — Ambulatory Visit (INDEPENDENT_AMBULATORY_CARE_PROVIDER_SITE_OTHER): Payer: Medicare Other | Admitting: *Deleted

## 2014-01-22 DIAGNOSIS — Z7901 Long term (current) use of anticoagulants: Secondary | ICD-10-CM

## 2014-01-22 DIAGNOSIS — Z5181 Encounter for therapeutic drug level monitoring: Secondary | ICD-10-CM

## 2014-01-22 DIAGNOSIS — I4891 Unspecified atrial fibrillation: Secondary | ICD-10-CM

## 2014-01-22 LAB — POCT INR: INR: 2.3

## 2014-01-28 ENCOUNTER — Other Ambulatory Visit: Payer: Self-pay | Admitting: Internal Medicine

## 2014-02-11 ENCOUNTER — Other Ambulatory Visit: Payer: Self-pay | Admitting: Internal Medicine

## 2014-02-12 ENCOUNTER — Other Ambulatory Visit: Payer: Self-pay | Admitting: Internal Medicine

## 2014-02-15 ENCOUNTER — Ambulatory Visit (INDEPENDENT_AMBULATORY_CARE_PROVIDER_SITE_OTHER): Payer: Medicare Other

## 2014-02-15 DIAGNOSIS — Z23 Encounter for immunization: Secondary | ICD-10-CM

## 2014-02-24 ENCOUNTER — Other Ambulatory Visit: Payer: Self-pay | Admitting: Internal Medicine

## 2014-02-26 ENCOUNTER — Other Ambulatory Visit: Payer: Self-pay | Admitting: Internal Medicine

## 2014-02-28 ENCOUNTER — Telehealth: Payer: Self-pay | Admitting: Internal Medicine

## 2014-02-28 ENCOUNTER — Other Ambulatory Visit: Payer: Self-pay | Admitting: Internal Medicine

## 2014-02-28 MED ORDER — FUROSEMIDE 40 MG PO TABS: 40.0000 mg | ORAL_TABLET | Freq: Every day | ORAL | Status: DC

## 2014-02-28 NOTE — Telephone Encounter (Signed)
Notified pt sent 30 day until appt.../lmb 

## 2014-02-28 NOTE — Telephone Encounter (Signed)
Patient called requesting a few lasix to get through until his appt Monday. Pt uses [facility] [address]. He is aware Dr. Linna Darner is out of the office today.

## 2014-03-03 ENCOUNTER — Encounter: Payer: Self-pay | Admitting: Internal Medicine

## 2014-03-03 ENCOUNTER — Ambulatory Visit (INDEPENDENT_AMBULATORY_CARE_PROVIDER_SITE_OTHER): Payer: Medicare Other | Admitting: Internal Medicine

## 2014-03-03 VITALS — BP 126/80 | HR 56 | Temp 97.7°F | Wt 218.1 lb

## 2014-03-03 DIAGNOSIS — Z8739 Personal history of other diseases of the musculoskeletal system and connective tissue: Secondary | ICD-10-CM

## 2014-03-03 DIAGNOSIS — I10 Essential (primary) hypertension: Secondary | ICD-10-CM

## 2014-03-03 DIAGNOSIS — E782 Mixed hyperlipidemia: Secondary | ICD-10-CM

## 2014-03-03 DIAGNOSIS — Z8639 Personal history of other endocrine, nutritional and metabolic disease: Secondary | ICD-10-CM

## 2014-03-03 NOTE — Progress Notes (Signed)
Pre visit review using our clinic review tool, if applicable. No additional management support is needed unless otherwise documented below in the visit note. 

## 2014-03-03 NOTE — Patient Instructions (Signed)

## 2014-03-03 NOTE — Progress Notes (Signed)
   Subjective:    Patient ID: Nathan [name], male    DOB: June 24, 1940, 73 y.o.   MRN: [id number]  HPI   He has been compliant with his blood pressure and rate control medications. He denies any adverse effects  He is scheduled to see the Cardiologist in December. His Cardiologist has him on a beta blocker as well as calcium channel blocker, both rate limiting. He is asymptomatic from this .  He also sees a Dealer & is on finasteride for prostatic hypertrophy.  I'm prescribing the ACE inhibitor and furosemide.  Blood pressure at home averages 125/87. The systolic is always less than 130.  His lipids have revealed a mild increase in the triglycerides @ 152. This is somewhat surprising in that he has had a history of gout with uric acid level of 8.9 in August 2014.  His LDL was 100 and HDL 45 in December 2014.  Last renal function tests were 03/2013; creatinine was 1.5.    Review of Systems   Chest pain, palpitations, tachycardia, exertional dyspnea, paroxysmal nocturnal dyspnea, claudication or edema are absent.  He's had no recurrence of gout. The episode was in the left elbow.       Objective:   Physical Exam   Positive or pertinent findings include: He appears healthy ,well-nourished & in no distress There is marked hyperpigmentation of the forearms There slurring of the first heart sound accentuation of the second Posterior tibial pulses are decreased. 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 He ambulates with a cane.  Eyes: No conjunctival inflammation or scleral icterus is present. Oral exam: Dental hygiene is good. Lips and gums are healthy appearing.There is no oropharyngeal erythema or exudate noted.  Heart:  Normal rate and regular rhythm. No gallop, murmur, click, rub or other extra sounds   Lungs:Chest clear to auscultation; no wheezes, rhonchi,rales ,or rubs  present.No increased work of breathing.  Abdomen: bowel sounds normal, soft and non-tender without masses, organomegaly or hernias noted.  No guarding or rebound.  Vascular : all pulses equal ; no bruits present. Skin:Warm & dry.  Intact without suspicious lesions or rashes ; no jaundice or tenting Lymphatic: No lymphadenopathy is noted about the head, neck, axilla            Assessment & Plan:  See Current Assessment & Plan in Problem List under specific Diagnosis

## 2014-03-04 DIAGNOSIS — E781 Pure hyperglyceridemia: Secondary | ICD-10-CM | POA: Insufficient documentation

## 2014-03-04 NOTE — Assessment & Plan Note (Signed)
Blood pressure goals reviewed. BMET 

## 2014-03-04 NOTE — Assessment & Plan Note (Signed)
Uric Acid 

## 2014-03-04 NOTE — Assessment & Plan Note (Signed)
Lipids, LFTs, TSH  

## 2014-03-05 ENCOUNTER — Ambulatory Visit (INDEPENDENT_AMBULATORY_CARE_PROVIDER_SITE_OTHER): Payer: Medicare Other | Admitting: *Deleted

## 2014-03-05 DIAGNOSIS — Z7901 Long term (current) use of anticoagulants: Secondary | ICD-10-CM

## 2014-03-05 DIAGNOSIS — I4891 Unspecified atrial fibrillation: Secondary | ICD-10-CM

## 2014-03-05 DIAGNOSIS — Z5181 Encounter for therapeutic drug level monitoring: Secondary | ICD-10-CM

## 2014-03-05 LAB — POCT INR: INR: 2.3

## 2014-03-06 ENCOUNTER — Other Ambulatory Visit (INDEPENDENT_AMBULATORY_CARE_PROVIDER_SITE_OTHER): Payer: Medicare Other

## 2014-03-06 DIAGNOSIS — E782 Mixed hyperlipidemia: Secondary | ICD-10-CM

## 2014-03-06 DIAGNOSIS — I10 Essential (primary) hypertension: Secondary | ICD-10-CM

## 2014-03-06 DIAGNOSIS — Z8739 Personal history of other diseases of the musculoskeletal system and connective tissue: Secondary | ICD-10-CM

## 2014-03-06 DIAGNOSIS — Z8639 Personal history of other endocrine, nutritional and metabolic disease: Secondary | ICD-10-CM

## 2014-03-06 LAB — BASIC METABOLIC PANEL
BUN: 31 mg/dL — ABNORMAL HIGH (ref 6–23)
CO2: 26 mEq/L (ref 19–32)
Calcium: 9.2 mg/dL (ref 8.4–10.5)
Chloride: 110 mEq/L (ref 96–112)
Creatinine, Ser: 1.5 mg/dL (ref 0.4–1.5)
GFR: 50.31 mL/min — ABNORMAL LOW (ref >60.00)
Glucose, Bld: 89 mg/dL (ref 70–99)
Potassium: 3.8 mEq/L (ref 3.5–5.1)
Sodium: 146 mEq/L — ABNORMAL HIGH (ref 135–145)

## 2014-03-06 LAB — HEPATIC FUNCTION PANEL
ALT: 14 U/L (ref 0–53)
AST: 16 U/L (ref 0–37)
Albumin: 4 g/dL (ref 3.5–5.2)
Alkaline Phosphatase: 66 U/L (ref 39–117)
Bilirubin, Direct: 0.1 mg/dL (ref 0.0–0.3)
Total Bilirubin: 0.8 mg/dL (ref 0.2–1.2)
Total Protein: 6.8 g/dL (ref 6.0–8.3)

## 2014-03-06 LAB — LIPID PANEL
Cholesterol: 169 mg/dL (ref 0–200)
HDL: 48.4 mg/dL (ref >39.00)
LDL Cholesterol: 98 mg/dL (ref 0–99)
NonHDL: 120.6
Total CHOL/HDL Ratio: 3
Triglycerides: 115 mg/dL (ref 0.0–149.0)
VLDL: 23 mg/dL (ref 0.0–40.0)

## 2014-03-06 LAB — TSH: TSH: 2.9 u[IU]/mL (ref 0.35–4.50)

## 2014-03-06 LAB — URIC ACID: Uric Acid, Serum: 6.7 mg/dL (ref 4.0–7.8)

## 2014-03-08 ENCOUNTER — Other Ambulatory Visit: Payer: Self-pay | Admitting: Internal Medicine

## 2014-03-11 ENCOUNTER — Other Ambulatory Visit: Payer: Self-pay | Admitting: Internal Medicine

## 2014-03-26 ENCOUNTER — Other Ambulatory Visit: Payer: Self-pay | Admitting: Internal Medicine

## 2014-03-28 ENCOUNTER — Encounter: Payer: Self-pay | Admitting: Cardiovascular Disease

## 2014-03-28 ENCOUNTER — Encounter: Payer: Self-pay | Admitting: Internal Medicine

## 2014-04-07 ENCOUNTER — Ambulatory Visit: Payer: Medicare Other | Admitting: Nurse Practitioner

## 2014-04-08 ENCOUNTER — Encounter: Payer: Self-pay | Admitting: Nurse Practitioner

## 2014-04-08 ENCOUNTER — Ambulatory Visit (INDEPENDENT_AMBULATORY_CARE_PROVIDER_SITE_OTHER): Payer: Medicare Other | Admitting: Nurse Practitioner

## 2014-04-08 ENCOUNTER — Ambulatory Visit (INDEPENDENT_AMBULATORY_CARE_PROVIDER_SITE_OTHER): Payer: Medicare Other | Admitting: *Deleted

## 2014-04-08 VITALS — BP 140/90 | HR 92 | Ht 74.0 in | Wt 222.4 lb

## 2014-04-08 DIAGNOSIS — Z7901 Long term (current) use of anticoagulants: Secondary | ICD-10-CM

## 2014-04-08 DIAGNOSIS — I482 Chronic atrial fibrillation, unspecified: Secondary | ICD-10-CM

## 2014-04-08 DIAGNOSIS — Z5181 Encounter for therapeutic drug level monitoring: Secondary | ICD-10-CM

## 2014-04-08 DIAGNOSIS — I10 Essential (primary) hypertension: Secondary | ICD-10-CM

## 2014-04-08 DIAGNOSIS — I4891 Unspecified atrial fibrillation: Secondary | ICD-10-CM

## 2014-04-08 DIAGNOSIS — Z79899 Other long term (current) drug therapy: Secondary | ICD-10-CM

## 2014-04-08 LAB — CBC
HCT: 44.2 % (ref 39.0–52.0)
Hemoglobin: 14.4 g/dL (ref 13.0–17.0)
MCHC: 32.5 g/dL (ref 30.0–36.0)
MCV: 94.5 fl (ref 78.0–100.0)
Platelets: 282 10*3/uL (ref 150.0–400.0)
RBC: 4.68 Mil/uL (ref 4.22–5.81)
RDW: 14.7 % (ref 11.5–15.5)
WBC: 8.9 10*3/uL (ref 4.0–10.5)

## 2014-04-08 LAB — POCT INR: INR: 2.1

## 2014-04-08 NOTE — Progress Notes (Signed)
Ibraham Levi Forbis Date of Birth: 1940/04/25 Medical Record #[id number]  History of Present Illness: Mr. Nathan [name] is seen back today for a one year check. Seen for Dr. Rayann Heman. He is a 73 year old male with chronic atrial fib and on coumadin. Other issues as noted below and include HTN, CKD, gout & hiatal hernia. Remote Myoview in 2010.  Last seen here by Dr. Rayann Heman a year ago - felt to be doing well.  Comes in today. Here alone. Doing ok. BP up a little today but he had to wait a little bit on me. Tolerating his medicines. No chest pain. Rare palpitations. No falls. No bleeding/bruising noted. Uses his cane. Has had recent labs but no CBC. Has gotten more sedentary.   Current Outpatient Prescriptions  Medication Sig Dispense Refill  . allopurinol (ZYLOPRIM) 100 MG tablet TAKE 1 TABLET BY MOUTH EVERY DAY 30 tablet 2  . diltiazem (CARDIZEM CD) 360 MG 24 hr capsule TAKE ONE CAPSULE BY MOUTH EVERY DAY 30 capsule 3  . finasteride (PROSCAR) 5 MG tablet Take 5 mg by mouth daily. Rx'ed by [name]    . furosemide (LASIX) 40 MG tablet TAKE 1 TABLET (40 MG TOTAL) BY MOUTH DAILY. 30 tablet 5  . lisinopril (PRINIVIL,ZESTRIL) 40 MG tablet Take 1 tablet (40 mg total) by mouth daily. 90 tablet 3  . metoprolol (LOPRESSOR) 100 MG tablet TAKE 1 TABLET BY MOUTH TWICE A DAY 180 tablet 1  . Multiple Vitamins-Minerals (MULTIVITAMIN WITH MINERALS) tablet Take 1 tablet by mouth daily.    Marland Kitchen warfarin (COUMADIN) 5 MG tablet TAKE 1 TAB EVERY DAY EXCEPT 1/2 TAB ONLY ON MONDAYS AND FRIDAYS OR AS DIRECTED BY COUMADIN CLINIC 30 tablet 3   No current facility-administered medications for this visit.    No Known Allergies  Past Medical History  Diagnosis Date  . Permanent atrial fibrillation   . Hypertension   . Chronic renal impairment   . DJD (degenerative joint disease)   . Diverticulosis   . Elevated PSA     Dr Junious Silk  . 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 Cancer     skin  . PONV (postoperative nausea and vomiting)     no  problems recently  . H/O hiatal hernia     Past Surgical History  Procedure Laterality Date  . 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 Total hip arthroplasty Right 07  . Colonoscopy  2003  . Hiatal hernia repair      Dr Kaylyn Lim , laparoscopic  . Hernia repair    . Inguinal hernia repair Left 03/05/2013    Procedure: HERNIA REPAIR INGUINAL ADULT;  Surgeon: [name], MD;  Location: [facility];  Service: General;  Laterality: Left;  . Insertion of mesh Left 03/05/2013    Procedure: INSERTION OF MESH;  Surgeon: [name], MD;  Location: [facility];  Service: General;  Laterality: Left;    History  Smoking status  . Former Smoker -- 0.25 packs/day for 3 years  . Types: Cigarettes  . Quit date: 04/17/1991  Smokeless tobacco  . Never Used    History  Alcohol Use  . 1.2 oz/week  . 2 Glasses of wine per week    Comment: occasionally    Family History  Problem Relation Age of Onset  . Hypertension Mother   . Skin cancer Mother   . Brain cancer Sister   . Hypertension  Brother   . Diabetes Brother   . Stroke Neg Hx   . Heart disease Neg Hx     Review of Systems: The review of systems is per the HPI.  All other systems were reviewed and are negative.  Physical Exam: BP 140/90 mmHg  Pulse 92  Ht 6\' 2"  (1.88 m)  Wt 222 lb 6.4 oz (100.88 kg)  BMI 28.54 kg/m2  SpO2 98% Patient is very pleasant and in no acute distress. Skin is warm and dry. Color is normal.  HEENT is unremarkable. Normocephalic/atraumatic. PERRL. Sclera are nonicteric. Neck is supple. No masses. No JVD. Lungs are clear. Cardiac exam shows an irregular rhythm. Rate is ok.  Abdomen is soft. Extremities are without edema. Gait and ROM are intact. No gross neurologic deficits noted.  Wt Readings from Last 3 Encounters:  04/08/14 222 lb 6.4 oz (100.88 kg)  03/03/14 218 lb 2 oz (98.941 kg)  04/01/13 213 lb 12.8 oz (96.979 kg)     LABORATORY DATA/PROCEDURES:  Lab Results  Component Value Date   WBC 9.1 02/25/2013   HGB 14.5 02/25/2013   HCT 43.4 02/25/2013   PLT 267 02/25/2013   GLUCOSE 89 03/06/2014   CHOL 169 03/06/2014   TRIG 115.0 03/06/2014   HDL 48.40 03/06/2014   LDLCALC 98 03/06/2014   ALT 14 03/06/2014   AST 16 03/06/2014   NA 146* 03/06/2014   K 3.8 03/06/2014   CL 110 03/06/2014   CREATININE 1.5 03/06/2014   BUN 31* 03/06/2014   CO2 26 03/06/2014   TSH 2.90 03/06/2014   INR 2.1 04/08/2014    BNP (last 3 results) No results for input(s): PROBNP in the last 8760 hours.   Assessment / Plan: 1. Chronic atrial fib - managed with rate control and anticoagulation. EKG today unchanged.   2. Chronic coumadin therapy -  No problems noted. Does need CBC updated - will add to next INR.  3. HTN - recheck by me is 140/90 - he is adamant that he has better readings at home. He will continue to monitor.   Check CBC today - no change with current regimen. See back in a year.   Patient is agreeable to this plan and will call if any problems develop in the interim.   Burtis Junes, RN, [place] [address] [place], [place]  [phone]

## 2014-04-08 NOTE — Patient Instructions (Addendum)
We will be checking the following labs today CBC  Stay on your current medicines  Keep a check on your blood pressure for me - bring your cuff in for [place] to check when you come back  See Dr. Rayann Heman in one year  Call the [place] office at [phone] if you have any questions, problems or concerns.

## 2014-05-20 ENCOUNTER — Ambulatory Visit (INDEPENDENT_AMBULATORY_CARE_PROVIDER_SITE_OTHER): Payer: Medicare Other | Admitting: *Deleted

## 2014-05-20 DIAGNOSIS — I482 Chronic atrial fibrillation, unspecified: Secondary | ICD-10-CM

## 2014-05-20 DIAGNOSIS — Z5181 Encounter for therapeutic drug level monitoring: Secondary | ICD-10-CM

## 2014-05-20 DIAGNOSIS — Z7901 Long term (current) use of anticoagulants: Secondary | ICD-10-CM

## 2014-05-20 DIAGNOSIS — I4891 Unspecified atrial fibrillation: Secondary | ICD-10-CM

## 2014-05-20 LAB — POCT INR: INR: 2.4

## 2014-06-08 ENCOUNTER — Other Ambulatory Visit: Payer: Self-pay | Admitting: Internal Medicine

## 2014-06-18 ENCOUNTER — Other Ambulatory Visit: Payer: Self-pay | Admitting: Internal Medicine

## 2014-07-01 ENCOUNTER — Ambulatory Visit (INDEPENDENT_AMBULATORY_CARE_PROVIDER_SITE_OTHER): Payer: Medicare Other | Admitting: Pharmacist

## 2014-07-01 DIAGNOSIS — I482 Chronic atrial fibrillation, unspecified: Secondary | ICD-10-CM

## 2014-07-01 DIAGNOSIS — Z7901 Long term (current) use of anticoagulants: Secondary | ICD-10-CM

## 2014-07-01 DIAGNOSIS — I4891 Unspecified atrial fibrillation: Secondary | ICD-10-CM

## 2014-07-01 DIAGNOSIS — Z5181 Encounter for therapeutic drug level monitoring: Secondary | ICD-10-CM

## 2014-07-01 LAB — POCT INR: INR: 2.1

## 2014-07-06 ENCOUNTER — Other Ambulatory Visit: Payer: Self-pay | Admitting: Internal Medicine

## 2014-08-12 ENCOUNTER — Ambulatory Visit (INDEPENDENT_AMBULATORY_CARE_PROVIDER_SITE_OTHER): Payer: Medicare Other | Admitting: *Deleted

## 2014-08-12 DIAGNOSIS — Z7901 Long term (current) use of anticoagulants: Secondary | ICD-10-CM | POA: Diagnosis not present

## 2014-08-12 DIAGNOSIS — I482 Chronic atrial fibrillation, unspecified: Secondary | ICD-10-CM

## 2014-08-12 DIAGNOSIS — Z5181 Encounter for therapeutic drug level monitoring: Secondary | ICD-10-CM | POA: Diagnosis not present

## 2014-08-12 DIAGNOSIS — I4891 Unspecified atrial fibrillation: Secondary | ICD-10-CM

## 2014-08-12 LAB — POCT INR: INR: 1.9

## 2014-08-23 ENCOUNTER — Other Ambulatory Visit: Payer: Self-pay | Admitting: Internal Medicine

## 2014-09-01 ENCOUNTER — Other Ambulatory Visit: Payer: Self-pay | Admitting: Internal Medicine

## 2014-09-05 ENCOUNTER — Other Ambulatory Visit: Payer: Self-pay

## 2014-09-05 MED ORDER — ALLOPURINOL 100 MG PO TABS: 100.0000 mg | ORAL_TABLET | Freq: Every day | ORAL | Status: DC

## 2014-09-22 ENCOUNTER — Other Ambulatory Visit: Payer: Self-pay | Admitting: Internal Medicine

## 2014-09-23 ENCOUNTER — Ambulatory Visit (INDEPENDENT_AMBULATORY_CARE_PROVIDER_SITE_OTHER): Payer: Medicare Other | Admitting: *Deleted

## 2014-09-23 DIAGNOSIS — Z5181 Encounter for therapeutic drug level monitoring: Secondary | ICD-10-CM | POA: Diagnosis not present

## 2014-09-23 DIAGNOSIS — Z7901 Long term (current) use of anticoagulants: Secondary | ICD-10-CM

## 2014-09-23 DIAGNOSIS — I482 Chronic atrial fibrillation, unspecified: Secondary | ICD-10-CM

## 2014-09-23 DIAGNOSIS — I4891 Unspecified atrial fibrillation: Secondary | ICD-10-CM

## 2014-09-23 LAB — POCT INR: INR: 2.1

## 2014-10-07 ENCOUNTER — Other Ambulatory Visit: Payer: Self-pay | Admitting: Emergency Medicine

## 2014-10-07 MED ORDER — LISINOPRIL 40 MG PO TABS: 40.0000 mg | ORAL_TABLET | Freq: Every day | ORAL | Status: DC

## 2014-10-30 ENCOUNTER — Ambulatory Visit (INDEPENDENT_AMBULATORY_CARE_PROVIDER_SITE_OTHER): Payer: Medicare Other | Admitting: Internal Medicine

## 2014-10-30 ENCOUNTER — Other Ambulatory Visit (INDEPENDENT_AMBULATORY_CARE_PROVIDER_SITE_OTHER): Payer: Medicare Other

## 2014-10-30 ENCOUNTER — Encounter: Payer: Self-pay | Admitting: Internal Medicine

## 2014-10-30 VITALS — BP 122/80 | HR 62 | Temp 97.6°F | Resp 16 | Wt 222.0 lb

## 2014-10-30 DIAGNOSIS — R5382 Chronic fatigue, unspecified: Secondary | ICD-10-CM

## 2014-10-30 DIAGNOSIS — M10022 Idiopathic gout, left elbow: Secondary | ICD-10-CM

## 2014-10-30 DIAGNOSIS — I10 Essential (primary) hypertension: Secondary | ICD-10-CM | POA: Diagnosis not present

## 2014-10-30 DIAGNOSIS — R5383 Other fatigue: Secondary | ICD-10-CM | POA: Insufficient documentation

## 2014-10-30 DIAGNOSIS — R2689 Other abnormalities of gait and mobility: Secondary | ICD-10-CM | POA: Diagnosis not present

## 2014-10-30 DIAGNOSIS — M2550 Pain in unspecified joint: Secondary | ICD-10-CM

## 2014-10-30 LAB — BASIC METABOLIC PANEL
BUN: 25 mg/dL — ABNORMAL HIGH (ref 6–23)
CO2: 31 mEq/L (ref 19–32)
Calcium: 9.7 mg/dL (ref 8.4–10.5)
Chloride: 107 mEq/L (ref 96–112)
Creatinine, Ser: 1.48 mg/dL (ref 0.40–1.50)
GFR: 49.05 mL/min — ABNORMAL LOW (ref >60.00)
Glucose, Bld: 86 mg/dL (ref 70–99)
Potassium: 3.8 mEq/L (ref 3.5–5.1)
Sodium: 145 mEq/L (ref 135–145)

## 2014-10-30 LAB — CBC WITH DIFFERENTIAL/PLATELET
Basophils Absolute: 0 10*3/uL (ref 0.0–0.1)
Basophils Relative: 0.1 % (ref 0.0–3.0)
Eosinophils Absolute: 0.3 10*3/uL (ref 0.0–0.7)
Eosinophils Relative: 3.8 % (ref 0.0–5.0)
HCT: 45.9 % (ref 39.0–52.0)
Hemoglobin: 15.2 g/dL (ref 13.0–17.0)
Lymphocytes Relative: 15.4 % (ref 12.0–46.0)
Lymphs Abs: 1.3 10*3/uL (ref 0.7–4.0)
MCHC: 33.1 g/dL (ref 30.0–36.0)
MCV: 94.3 fl (ref 78.0–100.0)
Monocytes Absolute: 0.7 10*3/uL (ref 0.1–1.0)
Monocytes Relative: 8.5 % (ref 3.0–12.0)
Neutro Abs: 6 10*3/uL (ref 1.4–7.7)
Neutrophils Relative %: 72.2 % (ref 43.0–77.0)
Platelets: 269 10*3/uL (ref 150.0–400.0)
RBC: 4.87 Mil/uL (ref 4.22–5.81)
RDW: 15.7 % — ABNORMAL HIGH (ref 11.5–15.5)
WBC: 8.3 10*3/uL (ref 4.0–10.5)

## 2014-10-30 LAB — TSH: TSH: 1.98 u[IU]/mL (ref 0.35–4.50)

## 2014-10-30 LAB — URIC ACID: Uric Acid, Serum: 6.7 mg/dL (ref 4.0–7.8)

## 2014-10-30 MED ORDER — LISINOPRIL 40 MG PO TABS: 40.0000 mg | ORAL_TABLET | Freq: Every day | ORAL | Status: DC

## 2014-10-30 MED ORDER — TRAMADOL HCL 50 MG PO TABS
50.0000 mg | ORAL_TABLET | Freq: Three times a day (TID) | ORAL | Status: DC | PRN
Start: 2014-10-30 — End: 2015-01-27

## 2014-10-30 MED ORDER — ALLOPURINOL 100 MG PO TABS: 100.0000 mg | ORAL_TABLET | Freq: Every day | ORAL | Status: DC

## 2014-10-30 NOTE — Assessment & Plan Note (Signed)
Blood pressure goals reviewed. BMET 

## 2014-10-30 NOTE — Patient Instructions (Signed)
The Physical Therapy referral will be scheduled and you'll be notified of the time.Please call the Referral Co-Ordinator @ [phone] if you have not been notified of appointment time within 7-10 days.   Your next office appointment will be determined based upon review of your pending labs. Those written interpretation of the lab results and instructions will be transmitted to you by mail for your records.  Critical results will be called.   Followup as needed for any active or acute issue. Please report any significant change in your symptoms.

## 2014-10-30 NOTE — Progress Notes (Signed)
   Subjective:    Patient ID: Nathan [name], male    DOB: 01-17-1941, 74 y.o.   MRN: [id number]  HPI The patient is here to assess status of active health conditions.  PMH, FH, & Social History reviewed & updated. No change in family history.  He stays tired which he attributes to not being able to exercise due to arthralgias with diffuse joint pain. He previously was swimming but has pain in his heels even walking around the pool.  He denies any GI or constitutional symptoms to explain the fatigue.  He eats fast food frequently as he does not like vegetables.  He describes occasional dizziness every month or 2 without specific trigger.  He has had no recent gout attacks. His most recent uric acid level record was 6.7 in November 2015.     Review of Systems Chest pain, palpitations, tachycardia, exertional dyspnea, paroxysmal nocturnal dyspnea, claudication or edema are absent. No unexplained weight loss, abdominal pain, significant dyspepsia, dysphagia, melena, rectal bleeding, or persistently small caliber stools. Dysuria, pyuria, hematuria, frequency, nocturia or polyuria are denied. Change in hair, skin, nails denied. No bowel changes of constipation or diarrhea. No intolerance to heat or cold.     Objective:   Physical Exam  Pertinent or positive findings include: Earlobes are elongated. He has lacey scarring of the left tympanic membrane. He has an upper partial. Heart rhythm is very slow and irregular. Ventral hernias present.  He has isolated DIP OA changes in the hands. He has subcutaneous granulomatous changes of the left elbow. He has marked crepitus of the knees. Severe solar keratotic changes and hyperpigmentation are noted of the forearms. His gait is broad an unsteady. His posture is stooped.He uses a cane. There is a deformity of the left ankle with well healed operative scars. He has 1+ edema. Posterior tibial pulses are decreased.  General appearance :adequately  nourished; in no distress.  Eyes: No conjunctival inflammation or scleral icterus is present.  Oral exam:  Lips and gums are healthy appearing.There is no oropharyngeal erythema or exudate noted.  Heart:  S1 and S2 normal without gallop, murmur, click, rub or other extra sounds    Lungs:Chest clear to auscultation; no wheezes, rhonchi,rales ,or rubs present.No increased work of breathing.   Abdomen: bowel sounds normal, soft and non-tender without masses, or organomegaly noted.  No guarding or rebound. No flank tenderness to percussion.  Vascular : all pulses equal ; no bruits present.  Skin:Warm & dry.; no tenting or jaundice   Lymphatic: No lymphadenopathy is noted about the head, neck, axilla   Neuro: Strength, tone markedly decreased         Assessment & Plan:   #1 fatigue, most likely related to deconditioning. CBC and thyroid functions tests will checked  #2 chronic gait imbalance. Assessment by Physical Therapy will be ordered  #3 gout, inactive  #4 hypertension, adequate control  #5 history of renal insufficiency  See Current Assessment & Plan in Problem List under specific Diagnosis

## 2014-10-30 NOTE — Assessment & Plan Note (Signed)
CBC TSH

## 2014-10-30 NOTE — Assessment & Plan Note (Signed)
PT referral.

## 2014-10-30 NOTE — Progress Notes (Signed)
Pre visit review using our clinic review tool, if applicable. No additional management support is needed unless otherwise documented below in the visit note. 

## 2014-10-30 NOTE — Assessment & Plan Note (Signed)
Uric acid

## 2014-11-04 ENCOUNTER — Ambulatory Visit (INDEPENDENT_AMBULATORY_CARE_PROVIDER_SITE_OTHER): Payer: Medicare Other | Admitting: *Deleted

## 2014-11-04 DIAGNOSIS — Z7901 Long term (current) use of anticoagulants: Secondary | ICD-10-CM | POA: Diagnosis not present

## 2014-11-04 DIAGNOSIS — I482 Chronic atrial fibrillation, unspecified: Secondary | ICD-10-CM

## 2014-11-04 DIAGNOSIS — I4891 Unspecified atrial fibrillation: Secondary | ICD-10-CM

## 2014-11-04 DIAGNOSIS — Z5181 Encounter for therapeutic drug level monitoring: Secondary | ICD-10-CM

## 2014-11-04 LAB — POCT INR: INR: 2.3

## 2014-11-11 ENCOUNTER — Other Ambulatory Visit: Payer: Self-pay | Admitting: Internal Medicine

## 2014-12-16 ENCOUNTER — Ambulatory Visit (INDEPENDENT_AMBULATORY_CARE_PROVIDER_SITE_OTHER): Payer: Medicare Other | Admitting: *Deleted

## 2014-12-16 DIAGNOSIS — I4891 Unspecified atrial fibrillation: Secondary | ICD-10-CM | POA: Diagnosis not present

## 2014-12-16 DIAGNOSIS — Z5181 Encounter for therapeutic drug level monitoring: Secondary | ICD-10-CM

## 2014-12-16 DIAGNOSIS — Z7901 Long term (current) use of anticoagulants: Secondary | ICD-10-CM

## 2014-12-16 DIAGNOSIS — I482 Chronic atrial fibrillation, unspecified: Secondary | ICD-10-CM

## 2014-12-16 LAB — POCT INR: INR: 1.7

## 2015-01-13 ENCOUNTER — Ambulatory Visit (INDEPENDENT_AMBULATORY_CARE_PROVIDER_SITE_OTHER): Payer: Medicare Other | Admitting: *Deleted

## 2015-01-13 DIAGNOSIS — Z5181 Encounter for therapeutic drug level monitoring: Secondary | ICD-10-CM

## 2015-01-13 DIAGNOSIS — Z7901 Long term (current) use of anticoagulants: Secondary | ICD-10-CM | POA: Diagnosis not present

## 2015-01-13 DIAGNOSIS — I4891 Unspecified atrial fibrillation: Secondary | ICD-10-CM | POA: Diagnosis not present

## 2015-01-13 DIAGNOSIS — I482 Chronic atrial fibrillation, unspecified: Secondary | ICD-10-CM

## 2015-01-13 LAB — POCT INR: INR: 2.7

## 2015-01-20 ENCOUNTER — Telehealth: Payer: Self-pay | Admitting: Internal Medicine

## 2015-01-20 ENCOUNTER — Inpatient Hospital Stay (HOSPITAL_COMMUNITY)
Admission: EM | Admit: 2015-01-20 | Discharge: 2015-01-27 | DRG: 683 | Disposition: A | Discharge status: Skilled Nursing Facility | Payer: Medicare Other | Attending: Internal Medicine | Admitting: Internal Medicine

## 2015-01-20 ENCOUNTER — Encounter (HOSPITAL_COMMUNITY): Payer: Self-pay | Admitting: Family Medicine

## 2015-01-20 ENCOUNTER — Emergency Department (HOSPITAL_COMMUNITY): Payer: Medicare Other

## 2015-01-20 DIAGNOSIS — N189 Chronic kidney disease, unspecified: Secondary | ICD-10-CM

## 2015-01-20 DIAGNOSIS — N183 Chronic kidney disease, stage 3 unspecified: Secondary | ICD-10-CM | POA: Diagnosis present

## 2015-01-20 DIAGNOSIS — N1330 Unspecified hydronephrosis: Secondary | ICD-10-CM | POA: Diagnosis not present

## 2015-01-20 DIAGNOSIS — W19XXXA Unspecified fall, initial encounter: Secondary | ICD-10-CM

## 2015-01-20 DIAGNOSIS — I4891 Unspecified atrial fibrillation: Secondary | ICD-10-CM | POA: Diagnosis present

## 2015-01-20 DIAGNOSIS — Z808 Family history of malignant neoplasm of other organs or systems: Secondary | ICD-10-CM | POA: Diagnosis not present

## 2015-01-20 DIAGNOSIS — T465X1A Poisoning by other antihypertensive drugs, accidental (unintentional), initial encounter: Secondary | ICD-10-CM | POA: Diagnosis not present

## 2015-01-20 DIAGNOSIS — W1789XA Other fall from one level to another, initial encounter: Secondary | ICD-10-CM | POA: Diagnosis present

## 2015-01-20 DIAGNOSIS — I959 Hypotension, unspecified: Secondary | ICD-10-CM | POA: Diagnosis not present

## 2015-01-20 DIAGNOSIS — N170 Acute kidney failure with tubular necrosis: Secondary | ICD-10-CM | POA: Diagnosis not present

## 2015-01-20 DIAGNOSIS — E876 Hypokalemia: Secondary | ICD-10-CM | POA: Diagnosis not present

## 2015-01-20 DIAGNOSIS — Z7901 Long term (current) use of anticoagulants: Secondary | ICD-10-CM | POA: Diagnosis not present

## 2015-01-20 DIAGNOSIS — Z87891 Personal history of nicotine dependence: Secondary | ICD-10-CM | POA: Diagnosis not present

## 2015-01-20 DIAGNOSIS — N201 Calculus of ureter: Secondary | ICD-10-CM | POA: Diagnosis present

## 2015-01-20 DIAGNOSIS — S2249XA Multiple fractures of ribs, unspecified side, initial encounter for closed fracture: Secondary | ICD-10-CM | POA: Diagnosis present

## 2015-01-20 DIAGNOSIS — Z96651 Presence of right artificial knee joint: Secondary | ICD-10-CM | POA: Diagnosis present

## 2015-01-20 DIAGNOSIS — N141 Nephropathy induced by other drugs, medicaments and biological substances: Secondary | ICD-10-CM

## 2015-01-20 DIAGNOSIS — L8990 Pressure ulcer of unspecified site, unspecified stage: Secondary | ICD-10-CM | POA: Insufficient documentation

## 2015-01-20 DIAGNOSIS — E860 Dehydration: Secondary | ICD-10-CM | POA: Diagnosis present

## 2015-01-20 DIAGNOSIS — N401 Enlarged prostate with lower urinary tract symptoms: Secondary | ICD-10-CM | POA: Diagnosis present

## 2015-01-20 DIAGNOSIS — E785 Hyperlipidemia, unspecified: Secondary | ICD-10-CM | POA: Diagnosis present

## 2015-01-20 DIAGNOSIS — I129 Hypertensive chronic kidney disease with stage 1 through stage 4 chronic kidney disease, or unspecified chronic kidney disease: Secondary | ICD-10-CM | POA: Diagnosis present

## 2015-01-20 DIAGNOSIS — T45515A Adverse effect of anticoagulants, initial encounter: Secondary | ICD-10-CM

## 2015-01-20 DIAGNOSIS — N1419 Nephropathy induced by other drugs, medicaments and biological substances: Secondary | ICD-10-CM

## 2015-01-20 DIAGNOSIS — N179 Acute kidney failure, unspecified: Principal | ICD-10-CM | POA: Diagnosis present

## 2015-01-20 DIAGNOSIS — R338 Other retention of urine: Secondary | ICD-10-CM | POA: Diagnosis not present

## 2015-01-20 DIAGNOSIS — Z79899 Other long term (current) drug therapy: Secondary | ICD-10-CM | POA: Diagnosis not present

## 2015-01-20 DIAGNOSIS — Z96641 Presence of right artificial hip joint: Secondary | ICD-10-CM | POA: Diagnosis present

## 2015-01-20 DIAGNOSIS — I480 Paroxysmal atrial fibrillation: Secondary | ICD-10-CM | POA: Diagnosis not present

## 2015-01-20 DIAGNOSIS — D6832 Hemorrhagic disorder due to extrinsic circulating anticoagulants: Secondary | ICD-10-CM

## 2015-01-20 DIAGNOSIS — I482 Chronic atrial fibrillation: Secondary | ICD-10-CM | POA: Diagnosis present

## 2015-01-20 DIAGNOSIS — I10 Essential (primary) hypertension: Secondary | ICD-10-CM | POA: Diagnosis not present

## 2015-01-20 DIAGNOSIS — R944 Abnormal results of kidney function studies: Secondary | ICD-10-CM | POA: Diagnosis present

## 2015-01-20 LAB — CBC WITH DIFFERENTIAL/PLATELET
Basophils Absolute: 0 10*3/uL (ref 0.0–0.1)
Basophils Relative: 0 %
Eosinophils Absolute: 0 10*3/uL (ref 0.0–0.7)
Eosinophils Relative: 0 %
HCT: 42.2 % (ref 39.0–52.0)
Hemoglobin: 13.9 g/dL (ref 13.0–17.0)
Lymphocytes Relative: 3 %
Lymphs Abs: 0.4 10*3/uL — ABNORMAL LOW (ref 0.7–4.0)
MCH: 31.8 pg (ref 26.0–34.0)
MCHC: 32.9 g/dL (ref 30.0–36.0)
MCV: 96.6 fL (ref 78.0–100.0)
Monocytes Absolute: 1.3 10*3/uL — ABNORMAL HIGH (ref 0.1–1.0)
Monocytes Relative: 10 %
Neutro Abs: 11 10*3/uL — ABNORMAL HIGH (ref 1.7–7.7)
Neutrophils Relative %: 87 %
Platelets: 208 10*3/uL (ref 150–400)
RBC: 4.37 MIL/uL (ref 4.22–5.81)
RDW: 15.4 % (ref 11.5–15.5)
WBC: 12.6 10*3/uL — ABNORMAL HIGH (ref 4.0–10.5)

## 2015-01-20 LAB — URINALYSIS, ROUTINE W REFLEX MICROSCOPIC
Bilirubin Urine: NEGATIVE
Glucose, UA: NEGATIVE mg/dL
Ketones, ur: NEGATIVE mg/dL
Nitrite: NEGATIVE
Protein, ur: 100 mg/dL — AB
Specific Gravity, Urine: 1.021 (ref 1.005–1.030)
Urobilinogen, UA: 1 mg/dL (ref 0.0–1.0)
pH: 5 (ref 5.0–8.0)

## 2015-01-20 LAB — COMPREHENSIVE METABOLIC PANEL
ALT: 33 U/L (ref 17–63)
AST: 40 U/L (ref 15–41)
Albumin: 2.8 g/dL — ABNORMAL LOW (ref 3.5–5.0)
Alkaline Phosphatase: 77 U/L (ref 38–126)
Anion gap: 12 (ref 5–15)
BUN: 50 mg/dL — ABNORMAL HIGH (ref 6–20)
CO2: 21 mmol/L — ABNORMAL LOW (ref 22–32)
Calcium: 8.5 mg/dL — ABNORMAL LOW (ref 8.9–10.3)
Chloride: 106 mmol/L (ref 101–111)
Creatinine, Ser: 3.37 mg/dL — ABNORMAL HIGH (ref 0.61–1.24)
GFR calc Af Amer: 19 mL/min — ABNORMAL LOW (ref >60)
GFR calc non Af Amer: 16 mL/min — ABNORMAL LOW (ref >60)
Glucose, Bld: 100 mg/dL — ABNORMAL HIGH (ref 65–99)
Potassium: 3.9 mmol/L (ref 3.5–5.1)
Sodium: 139 mmol/L (ref 135–145)
Total Bilirubin: 0.7 mg/dL (ref 0.3–1.2)
Total Protein: 6.9 g/dL (ref 6.5–8.1)

## 2015-01-20 LAB — URINE MICROSCOPIC-ADD ON

## 2015-01-20 LAB — PROTIME-INR
INR: 4.87 — ABNORMAL HIGH (ref 0.00–1.49)
Prothrombin Time: 44 seconds — ABNORMAL HIGH (ref 11.6–15.2)

## 2015-01-20 MED ORDER — SODIUM CHLORIDE 0.9 % IV SOLN
INTRAVENOUS | Status: DC
  Administered 2015-01-20 – 2015-01-26 (×9): via INTRAVENOUS

## 2015-01-20 MED ORDER — TETANUS-DIPHTH-ACELL PERTUSSIS 5-2.5-18.5 LF-MCG/0.5 IM SUSP
0.5000 mL | Freq: Once | INTRAMUSCULAR | Status: DC
  Filled 2015-01-20: qty 0.5

## 2015-01-20 MED ORDER — ONDANSETRON HCL 4 MG PO TABS
4.0000 mg | ORAL_TABLET | Freq: Four times a day (QID) | ORAL | Status: DC | PRN
  Administered 2015-01-27: 4 mg via ORAL
  Filled 2015-01-20: qty 1

## 2015-01-20 MED ORDER — ONDANSETRON HCL 4 MG/2ML IJ SOLN: 4.0000 mg | Freq: Four times a day (QID) | INTRAMUSCULAR | Status: DC | PRN

## 2015-01-20 MED ORDER — DILTIAZEM HCL ER BEADS 240 MG PO CP24
360.0000 mg | ORAL_CAPSULE | Freq: Every day | ORAL | Status: DC
  Filled 2015-01-20: qty 1

## 2015-01-20 MED ORDER — ACETAMINOPHEN 325 MG PO TABS
650.0000 mg | ORAL_TABLET | Freq: Four times a day (QID) | ORAL | Status: DC | PRN
  Administered 2015-01-21 – 2015-01-25 (×2): 650 mg via ORAL
  Filled 2015-01-20 (×2): qty 2

## 2015-01-20 MED ORDER — SODIUM CHLORIDE 0.9 % IJ SOLN
3.0000 mL | Freq: Two times a day (BID) | INTRAMUSCULAR | Status: DC
  Administered 2015-01-23: 3 mL via INTRAVENOUS

## 2015-01-20 MED ORDER — METOPROLOL TARTRATE 100 MG PO TABS
100.0000 mg | ORAL_TABLET | Freq: Two times a day (BID) | ORAL | Status: DC
  Administered 2015-01-20 – 2015-01-27 (×14): 100 mg via ORAL
  Filled 2015-01-20 (×14): qty 1

## 2015-01-20 MED ORDER — ACETAMINOPHEN 650 MG RE SUPP: 650.0000 mg | Freq: Four times a day (QID) | RECTAL | Status: DC | PRN

## 2015-01-20 MED ORDER — SODIUM CHLORIDE 0.9 % IV BOLUS (SEPSIS)
500.0000 mL | Freq: Once | INTRAVENOUS | Status: AC
  Administered 2015-01-20: 500 mL via INTRAVENOUS

## 2015-01-20 MED ORDER — FINASTERIDE 5 MG PO TABS
5.0000 mg | ORAL_TABLET | Freq: Every day | ORAL | Status: DC
  Administered 2015-01-21 – 2015-01-27 (×7): 5 mg via ORAL
  Filled 2015-01-20 (×7): qty 1

## 2015-01-20 MED ORDER — SODIUM CHLORIDE 0.9 % IV SOLN
INTRAVENOUS | Status: DC
  Administered 2015-01-20: 16:00:00 via INTRAVENOUS

## 2015-01-20 MED ORDER — ADULT MULTIVITAMIN W/MINERALS CH
1.0000 | ORAL_TABLET | Freq: Every day | ORAL | Status: DC
  Administered 2015-01-21 – 2015-01-27 (×7): 1 via ORAL
  Filled 2015-01-20 (×7): qty 1

## 2015-01-20 MED ORDER — MULTI-VITAMIN/MINERALS PO TABS: 1.0000 | ORAL_TABLET | Freq: Every day | ORAL | Status: DC

## 2015-01-20 MED ORDER — OXYCODONE HCL 5 MG PO TABS
5.0000 mg | ORAL_TABLET | ORAL | Status: DC | PRN
  Administered 2015-01-21 – 2015-01-27 (×17): 5 mg via ORAL
  Filled 2015-01-20 (×18): qty 1

## 2015-01-20 NOTE — ED Notes (Signed)
Pt already had tetanus six months ago by PCP

## 2015-01-20 NOTE — Telephone Encounter (Signed)
New message      Pt just fell at home outside because he is extremely weak.  Calling to see if they need to come here

## 2015-01-20 NOTE — Progress Notes (Signed)
Attempted report. Waiting for call back.  Ermalinda Memos, RN

## 2015-01-20 NOTE — ED Notes (Signed)
Pt here for fall out of car this am. sts injured right knee and right rib area. sts he has been weak over the past few days and worried something cardiac is going on. Denies any chest pain, SOB. No LOC. BP 132/70, pulse 72 and irregular, CBG 189.

## 2015-01-20 NOTE — Telephone Encounter (Signed)
Pt states he collapsed in his driveway earlier today and last Thursday.  Pt states he did not lose consciousness, denies tachycardia/chest pain/SOB/numbness in extremities, speak or vision problems. Pt states he generally feels weak all over.  Pt does not know his heart rate or BP.  Pt states he hit his hip when he collapsed and has an abrasion in hip area.  I reviewed with Richardson Dopp, PA,c--he offered to see pt this afternoon at 3:30PM. Pt's friend states pt is too weak to come to office, she is unable to manage him by herself, she is concerned about transporting him and getting him in to our office.  Pt's friend states she is going to call 911 for transport to [facility], friend advised I agreed with this plan.

## 2015-01-20 NOTE — H&P (Addendum)
[facility] History and Physical  [name] QIW:[id number] DOB: 01-19-1941 DOA: 01/20/2015  Referring physician: Christ Kick, MD PCP: [name], MD   Chief Complaint: Falls  HPI: Nathan [name] is a 74 y.o. male with history of atrial fibrillation HTN chronic anticoagulation diverticulosis CKD presented to the ED with falling episodes. Apparently the patient fell while trying to get out of the car. Patient sustained a knee injury as a result. Patient has also been having generalized weakness. Patient was noted in the ED to have an elevated creatinine and is being admitted for further assessment. Patient had no chest pain and had no syncope. Patient has no nausea or vomiting noted. He has been on ACE inhibitor and also has been on diuretics. Now feels better other than being weak.   Review of Systems:  12 point ROS performed and is unremarkable other than HPI  Past Medical History  Diagnosis Date  . Permanent atrial fibrillation ([place])   . Hypertension   . Chronic renal impairment   . DJD (degenerative joint disease)   . Diverticulosis   . Elevated PSA     Dr Junious Silk  . Infected prosthetic knee joint ([place])     h/o Group B streptococcal prosthetic knee infection  . Cancer ([place])     skin  . PONV (postoperative nausea and vomiting)     no  problems recently  . H/O hiatal hernia    Past Surgical History  Procedure Laterality Date  . 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 Total hip arthroplasty Right 07  . Colonoscopy  2003  . Hiatal hernia repair      Dr Kaylyn Lim , laparoscopic  . Hernia repair    . Inguinal hernia repair Left 03/05/2013    Procedure: HERNIA REPAIR INGUINAL ADULT;  Surgeon: [name], MD;  Location: [facility];  Service: General;  Laterality: Left;  . Insertion of mesh Left 03/05/2013    Procedure: INSERTION OF MESH;  Surgeon: [name], MD;  Location: [place];  Service: General;  Laterality: Left;   Social History:  reports that  he quit smoking about 23 years ago. His smoking use included Cigarettes. He has a .75 pack-year smoking history. He has never used smokeless tobacco. He reports that he drinks about 1.2 oz of alcohol per week. He reports that he does not use illicit drugs.  Allergies  Allergen Reactions  . Tramadol Nausea And Vomiting    Family History  Problem Relation Age of Onset  . Hypertension Mother   . Skin cancer Mother   . Brain cancer Sister   . Hypertension Brother   . Diabetes Brother   . Stroke Neg Hx   . Heart disease Neg Hx      Prior to Admission medications   Medication Sig Start Date End Date Taking? Authorizing Provider  acetaminophen (TYLENOL) 650 MG CR tablet Take 650 mg by mouth every 8 (eight) hours as needed for pain.   Yes Historical Provider, MD  allopurinol (ZYLOPRIM) 100 MG tablet Take 1 tablet (100 mg total) by mouth daily. 10/30/14  Yes Hendricks Limes, MD  diltiazem [facility]) 360 MG 24 hr capsule TAKE ONE CAPSULE BY MOUTH EVERY DAY 07/07/14  Yes Thompson Grayer, MD  finasteride (PROSCAR) 5 MG tablet Take 5 mg by mouth daily. Rx'ed by [name]   Yes Historical Provider, MD  furosemide (LASIX) 40 MG tablet TAKE 1 TABLET (40 MG TOTAL) BY MOUTH DAILY. 09/22/14  Yes Darrick Penna  Linna Darner, MD  lisinopril (PRINIVIL,ZESTRIL) 40 MG tablet Take 1 tablet (40 mg total) by mouth daily. 10/30/14  Yes Hendricks Limes, MD  metoprolol (LOPRESSOR) 100 MG tablet TAKE 1 TABLET BY MOUTH TWICE A DAY 08/25/14  Yes Thompson Grayer, MD  Multiple Vitamins-Minerals (MULTIVITAMIN WITH MINERALS) tablet Take 1 tablet by mouth daily.   Yes Historical Provider, MD  warfarin (COUMADIN) 5 MG tablet TAKE 1 TAB EVERY DAY EXCEPT 1/2 TAB ONLY ON MONDAYS AND FRIDAYS OR AS DIRECTED BY COUMADIN CLINIC 11/11/14  Yes Thompson Grayer, MD  traMADol (ULTRAM) 50 MG tablet Take 1 tablet (50 mg total) by mouth every 8 (eight) hours as needed. Patient not taking: Reported on 01/20/2015 10/30/14   Hendricks Limes, MD   Physical  Exam: Filed Vitals:   01/20/15 1630 01/20/15 1730 01/20/15 1800 01/20/15 1830  BP: 127/72 136/89 151/90 149/76  Pulse: 89 87 65 77  Temp:      Resp: 22  28 18   SpO2: 92% 91% 90% 91%    Wt Readings from Last 3 Encounters:  10/30/14 100.699 kg (222 lb)  04/08/14 100.88 kg (222 lb 6.4 oz)  03/03/14 98.941 kg (218 lb 2 oz)    General:  Appears calm and comfortable Eyes: PERRL, normal lids, irises & conjunctiva ENT: grossly normal hearing, lips & tongue Neck: no LAD, masses or thyromegaly Cardiovascular: RRR, no m/r/g. No LE edema. Telemetry: atrial fibrillation Respiratory: CTA bilaterally, no w/r/r Abdomen: soft, ntnd Skin: no rash Musculoskeletal: grossly normal tone BUE/BLE Psychiatric: grossly normal mood and affect Neurologic: grossly non-focal.          Labs on Admission:  Basic Metabolic Panel:  Recent Labs Lab 01/20/15 1604  NA 139  K 3.9  CL 106  CO2 21*  GLUCOSE 100*  BUN 50*  CREATININE 3.37*  CALCIUM 8.5*   Liver Function Tests:  Recent Labs Lab 01/20/15 1604  AST 40  ALT 33  ALKPHOS 77  BILITOT 0.7  PROT 6.9  ALBUMIN 2.8*   No results for input(s): LIPASE, AMYLASE in the last 168 hours. No results for input(s): AMMONIA in the last 168 hours. CBC:  Recent Labs Lab 01/20/15 1604  WBC 12.6*  NEUTROABS 11.0*  HGB 13.9  HCT 42.2  MCV 96.6  PLT 208   Cardiac Enzymes: No results for input(s): CKTOTAL, CKMB, CKMBINDEX, TROPONINI in the last 168 hours.  BNP (last 3 results) No results for input(s): BNP in the last 8760 hours.  ProBNP (last 3 results) No results for input(s): PROBNP in the last 8760 hours.  CBG: No results for input(s): GLUCAP in the last 168 hours.  Radiological Exams on Admission: Dg Knee Complete 4 Views Right  01/20/2015   CLINICAL DATA:  Fall.  Pain.  Initial evaluation.  EXAM: RIGHT KNEE - COMPLETE 4+ VIEW  COMPARISON:  None.  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 Hardware  intact. No acute bony abnormality. Soft tissue ossification most likely postsurgical . Loose bodies within the joint space. Peripheral vascular calcification.  IMPRESSION: No acute abnormality. 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 Hardware intact.   Electronically Signed   By: Marcello Moores  Register   On: 01/20/2015 15:58      Assessment/Plan Principal Problem:   Acute-on-chronic kidney injury [facility]) Active Problems:   Essential hypertension   Atrial fibrillation (HCC)   Chronic anticoagulation   1. Acute on chronic kidney injury possibly due to ACE inhibitor -His baseline creatinine appears to be around 1.5  now is elevated to 3.37 -will hold ACE inhibitor no hyperkalemia noted no indication for acute dialysis -will hold lasix and hold allopurinol also -start on IVF and monitor labs  2. HTN -will be holding lisinopril -will continue with tiazac -will continue with lopressor  3. HLD -check lipid panel  4. Atrial Fibrillation -rate controlled -monitor on tele -will continue lopressor and tiazac  5. Chronic Anticoagulation -will hold warfarin for now -repeat INR in am     Code Status: full code (must indicate code status--if unknown or must be presumed, indicate so) DVT Prophylaxis:coumadin Family Communication: none (indicate person spoken with, if applicable, with phone number if by telephone) Disposition Plan: home (indicate anticipated LOS)    [facility] Pager [phone]

## 2015-01-20 NOTE — ED Notes (Signed)
Patient transported to X-ray 

## 2015-01-20 NOTE — Progress Notes (Signed)
Patient arrived on unit via stretcher with RN. Patient alert and oriented x4. Patient oriented to room, unit and staff. Patient's significant other at the bedside. Patient placed on telemetry monitor, CCMD notified. Skin assessment completed with two RN's. Patient has an abrasion on the right knee, gauze and tape applied. Patient bottom also red in color. Patient IV clean, dry and infusing. Patient rates pain 0/10. Safety Fall Prevention Plan was given, discussed and signed by patient. Orders have been reviewed and implemented. Call light has been placed within reach and bed alarm has been activated. RN will continue to monitor the patient.   Nena Polio BSN, RN  Phone Number: [phone]

## 2015-01-20 NOTE — ED Provider Notes (Signed)
CSN: [id number]     Arrival date & time 01/20/15  1440 History   First MD Initiated Contact with Patient 01/20/15 1502     Chief Complaint  Patient presents with  . Fall  . Knee Pain  . Weakness     (Consider location/radiation/quality/duration/timing/severity/associated sxs/prior Treatment) HPI   Nathan [name] is a 73 y.o. male who presents for evaluation of fall getting out of car. He injured his right knee in the fall. He was able to get up to a chair afterwards, with help. He is transferred by EMS for evaluation. He feels that he fell because of "weakness". The weakness is global. It has been present for 1 week. He denies alterations of oral intake, diarrhea, constipation, vomiting or nausea. There's been no shortness of breath, cough or chest pain. He is taking his usual medications. There are no other known modifying factors.   Past Medical History  Diagnosis Date  . Permanent atrial fibrillation ([place])   . Hypertension   . Chronic renal impairment   . DJD (degenerative joint disease)   . Diverticulosis   . Elevated PSA     Dr Junious Silk  . Infected prosthetic knee joint ([place])     h/o Group B streptococcal prosthetic knee infection  . Cancer ([place])     skin  . PONV (postoperative nausea and vomiting)     no  problems recently  . H/O hiatal hernia    Past Surgical History  Procedure Laterality Date  . 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 Total hip arthroplasty Right 07  . Colonoscopy  2003  . Hiatal hernia repair      Dr Kaylyn Lim , laparoscopic  . Hernia repair    . Inguinal hernia repair Left 03/05/2013    Procedure: HERNIA REPAIR INGUINAL ADULT;  Surgeon: [name], MD;  Location: [place];  Service: General;  Laterality: Left;  . Insertion of mesh Left 03/05/2013    Procedure: INSERTION OF MESH;  Surgeon: [name], MD;  Location: [place] [facility];  Service: General;  Laterality: Left;   Family History  Problem Relation Age of Onset  . Hypertension Mother    . Skin cancer Mother   . Brain cancer Sister   . Hypertension Brother   . Diabetes Brother   . Stroke Neg Hx   . Heart disease Neg Hx    Social History  Substance Use Topics  . Smoking status: Former Smoker -- 0.25 packs/day for 3 years    Types: Cigarettes    Quit date: 04/17/1991  . Smokeless tobacco: Never Used  . Alcohol Use: 1.2 oz/week    2 Glasses of wine per week     Comment: occasionally    Review of Systems  All other systems reviewed and are negative.     Allergies  Tramadol  Home Medications   Prior to Admission medications   Medication Sig Start Date End Date Taking? Authorizing Provider  acetaminophen (TYLENOL) 650 MG CR tablet Take 650 mg by mouth every 8 (eight) hours as needed for pain.   Yes Historical Provider, MD  allopurinol (ZYLOPRIM) 100 MG tablet Take 1 tablet (100 mg total) by mouth daily. 10/30/14  Yes Hendricks Limes, MD  diltiazem [facility]) 360 MG 24 hr capsule TAKE ONE CAPSULE BY MOUTH EVERY DAY 07/07/14  Yes Thompson Grayer, MD  finasteride (PROSCAR) 5 MG tablet Take 5 mg by mouth daily. Rx'ed by [name]   Yes Historical Provider, MD  furosemide (LASIX)  40 MG tablet TAKE 1 TABLET (40 MG TOTAL) BY MOUTH DAILY. 09/22/14  Yes Hendricks Limes, MD  lisinopril (PRINIVIL,ZESTRIL) 40 MG tablet Take 1 tablet (40 mg total) by mouth daily. 10/30/14  Yes Hendricks Limes, MD  metoprolol (LOPRESSOR) 100 MG tablet TAKE 1 TABLET BY MOUTH TWICE A DAY 08/25/14  Yes Thompson Grayer, MD  Multiple Vitamins-Minerals (MULTIVITAMIN WITH MINERALS) tablet Take 1 tablet by mouth daily.   Yes Historical Provider, MD  warfarin (COUMADIN) 5 MG tablet TAKE 1 TAB EVERY DAY EXCEPT 1/2 TAB ONLY ON MONDAYS AND FRIDAYS OR AS DIRECTED BY COUMADIN CLINIC 11/11/14  Yes Thompson Grayer, MD  traMADol (ULTRAM) 50 MG tablet Take 1 tablet (50 mg total) by mouth every 8 (eight) hours as needed. Patient not taking: Reported on 01/20/2015 10/30/14   Hendricks Limes, MD   BP 136/89 mmHg  Pulse 87   Temp(Src) 98.8 F (37.1 C)  Resp 22  SpO2 91% Physical Exam  Constitutional: He is oriented to person, place, and time. He appears well-developed. No distress.  Elderly, frail  HENT:  Head: Normocephalic and atraumatic.  Right Ear: External ear normal.  Left Ear: External ear normal.  Eyes: Conjunctivae and EOM are normal. Pupils are equal, round, and reactive to light.  Neck: Normal range of motion and phonation normal. Neck supple.  Cardiovascular: Normal rate, regular rhythm and normal heart sounds.   Pulmonary/Chest: Effort normal and breath sounds normal. No respiratory distress. He exhibits no tenderness and no bony tenderness.  Abdominal: Soft. He exhibits no distension. There is no tenderness.  Musculoskeletal: Normal range of motion.  Mild tenderness right knee, anterior, with normal extension and flexion of the right knee. Left knee swollen, but is nontender to palpation. Normal range of motion. Left leg.   Neurological: He is alert and oriented to person, place, and time. No cranial nerve deficit or sensory deficit. He exhibits normal muscle tone. Coordination normal.  Skin: Skin is warm, dry and intact.  Superficial abrasion, left anterior knee, below the patella. Nonbleeding.  Psychiatric: He has a normal mood and affect. His behavior is normal. Judgment and thought content normal.  Nursing note and vitals reviewed.   ED Course  Procedures (including critical care time) Medications  0.9 %  sodium chloride infusion ( Intravenous New Bag/Given 01/20/15 1603)  Tdap (BOOSTRIX) injection 0.5 mL (0.5 mLs Intramuscular Not Given 01/20/15 1603)  sodium chloride 0.9 % bolus 500 mL (0 mLs Intravenous Stopped 01/20/15 1628)    Patient Vitals for the past 24 hrs:  BP Temp Pulse Resp SpO2  01/20/15 1730 136/89 mmHg - 87 - 91 %  01/20/15 1630 127/72 mmHg - 89 22 92 %  01/20/15 1605 136/73 mmHg - 78 24 92 %  01/20/15 1500 117/75 mmHg - 78 (!) 32 92 %  01/20/15 1455 112/65 mmHg 98.8  F (37.1 C) 77 (!) 30 93 %     6:33 PM-Consult complete with Dr. Humphrey Rolls. Patient case explained and discussed. He agrees to admit patient for further evaluation and treatment. Call ended at 18:50  Labs Review Labs Reviewed  COMPREHENSIVE METABOLIC PANEL - Abnormal; Notable for the following:    CO2 21 (*)    Glucose, Bld 100 (*)    BUN 50 (*)    Creatinine, Ser 3.37 (*)    Calcium 8.5 (*)    Albumin 2.8 (*)    GFR calc non Af Amer 16 (*)    GFR calc Af Amer 19 (*)  All other components within normal limits  CBC WITH DIFFERENTIAL/PLATELET - Abnormal; Notable for the following:    WBC 12.6 (*)    Neutro Abs 11.0 (*)    Lymphs Abs 0.4 (*)    Monocytes Absolute 1.3 (*)    All other components within normal limits  PROTIME-INR - Abnormal; Notable for the following:    Prothrombin Time 44.0 (*)    INR 4.87 (*)    All other components within normal limits  URINALYSIS, ROUTINE W REFLEX MICROSCOPIC (NOT AT [facility]) - Abnormal; Notable for the following:    APPearance HAZY (*)    Hgb urine dipstick SMALL (*)    Protein, ur 100 (*)    Leukocytes, UA SMALL (*)    All other components within normal limits  URINE CULTURE  URINE MICROSCOPIC-ADD ON    BUN  Date Value Ref Range Status  01/20/2015 50* 6 - 20 mg/dL Final  10/30/2014 25* 6 - 23 mg/dL Final  03/06/2014 31* 6 - 23 mg/dL Final  04/01/2013 31* 6 - 23 mg/dL Final   CREATININE, SER  Date Value Ref Range Status  01/20/2015 3.37* 0.61 - 1.24 mg/dL Final  10/30/2014 1.48 0.40 - 1.50 mg/dL Final  03/06/2014 1.5 0.4 - 1.5 mg/dL Final  04/01/2013 1.5 0.4 - 1.5 mg/dL Final      Imaging Review Dg Knee Complete 4 Views Right  01/20/2015   CLINICAL DATA:  Fall.  Pain.  Initial evaluation.  EXAM: RIGHT KNEE - COMPLETE 4+ VIEW  COMPARISON:  None.  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 Hardware intact. No acute bony abnormality. Soft tissue ossification most likely postsurgical . Loose bodies within the  joint space. Peripheral vascular calcification.  IMPRESSION: No acute abnormality. 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 Hardware intact.   Electronically Signed   By: Marcello Moores  Register   On: 01/20/2015 15:58   I have personally reviewed and evaluated these images and lab results as part of my medical decision-making.     Date: 14:55  Rate: 69  Rhythm: atrial fibrillation  QRS Axis: left  PR and QT Intervals: Normal QTC  ST/T Wave abnormalities: normal  PR and QRS Conduction Disutrbances:Normal QT  Narrative Interpretation:   Old EKG Reviewed: unchanged    EKG Interpretation None      MDM   Final diagnoses:  Fall  Acute renal failure ([place])  Hydronephrosis    Weakness with fall, without serious injury, cause likely related to volume depletion. He has AKI and is currently taking an ACE inhibitor. No active fluid loss to explain the volume depletion. He does take a diuretic. Will require admission for hydration, observation and medicine adjustment.  Nursing Notes Reviewed/ Care Coordinated, and agree without changes. Applicable Imaging Reviewed.  Interpretation of Laboratory Data incorporated into ED treatment  Plan: Admit    Daleen Bo, MD 01/23/15 [phone]

## 2015-01-21 ENCOUNTER — Inpatient Hospital Stay (HOSPITAL_COMMUNITY): Payer: Medicare Other

## 2015-01-21 DIAGNOSIS — W19XXXA Unspecified fall, initial encounter: Secondary | ICD-10-CM | POA: Diagnosis present

## 2015-01-21 DIAGNOSIS — E876 Hypokalemia: Secondary | ICD-10-CM | POA: Diagnosis present

## 2015-01-21 LAB — URINE CULTURE: Special Requests: NORMAL

## 2015-01-21 LAB — COMPREHENSIVE METABOLIC PANEL
ALT: 24 U/L (ref 17–63)
AST: 20 U/L (ref 15–41)
Albumin: 2.4 g/dL — ABNORMAL LOW (ref 3.5–5.0)
Alkaline Phosphatase: 62 U/L (ref 38–126)
Anion gap: 10 (ref 5–15)
BUN: 43 mg/dL — ABNORMAL HIGH (ref 6–20)
CO2: 21 mmol/L — ABNORMAL LOW (ref 22–32)
Calcium: 7.8 mg/dL — ABNORMAL LOW (ref 8.9–10.3)
Chloride: 107 mmol/L (ref 101–111)
Creatinine, Ser: 2.91 mg/dL — ABNORMAL HIGH (ref 0.61–1.24)
GFR calc Af Amer: 23 mL/min — ABNORMAL LOW (ref >60)
GFR calc non Af Amer: 20 mL/min — ABNORMAL LOW (ref >60)
Glucose, Bld: 98 mg/dL (ref 65–99)
Potassium: 3.3 mmol/L — ABNORMAL LOW (ref 3.5–5.1)
Sodium: 138 mmol/L (ref 135–145)
Total Bilirubin: 0.9 mg/dL (ref 0.3–1.2)
Total Protein: 5.5 g/dL — ABNORMAL LOW (ref 6.5–8.1)

## 2015-01-21 LAB — LIPID PANEL
Cholesterol: 103 mg/dL (ref 0–200)
HDL: 27 mg/dL — ABNORMAL LOW (ref >40)
LDL Cholesterol: 58 mg/dL (ref 0–99)
Total CHOL/HDL Ratio: 3.8 RATIO
Triglycerides: 92 mg/dL (ref <150)
VLDL: 18 mg/dL (ref 0–40)

## 2015-01-21 LAB — CBC
HCT: 38.2 % — ABNORMAL LOW (ref 39.0–52.0)
Hemoglobin: 12.5 g/dL — ABNORMAL LOW (ref 13.0–17.0)
MCH: 31.5 pg (ref 26.0–34.0)
MCHC: 32.7 g/dL (ref 30.0–36.0)
MCV: 96.2 fL (ref 78.0–100.0)
Platelets: 188 10*3/uL (ref 150–400)
RBC: 3.97 MIL/uL — ABNORMAL LOW (ref 4.22–5.81)
RDW: 15.3 % (ref 11.5–15.5)
WBC: 9.5 10*3/uL (ref 4.0–10.5)

## 2015-01-21 LAB — PROTIME-INR
INR: 4.81 — ABNORMAL HIGH (ref 0.00–1.49)
Prothrombin Time: 43.7 seconds — ABNORMAL HIGH (ref 11.6–15.2)

## 2015-01-21 LAB — GLUCOSE, CAPILLARY: Glucose-Capillary: 96 mg/dL (ref 65–99)

## 2015-01-21 MED ORDER — DILTIAZEM HCL ER COATED BEADS 360 MG PO CP24
360.0000 mg | ORAL_CAPSULE | Freq: Every day | ORAL | Status: DC
  Administered 2015-01-21 – 2015-01-27 (×6): 360 mg via ORAL
  Filled 2015-01-21: qty 2
  Filled 2015-01-21: qty 1
  Filled 2015-01-21: qty 2
  Filled 2015-01-21: qty 1
  Filled 2015-01-21: qty 2
  Filled 2015-01-21: qty 1
  Filled 2015-01-21: qty 2
  Filled 2015-01-21 (×4): qty 1
  Filled 2015-01-21: qty 2

## 2015-01-21 MED ORDER — POTASSIUM CHLORIDE CRYS ER 20 MEQ PO TBCR
40.0000 meq | EXTENDED_RELEASE_TABLET | Freq: Once | ORAL | Status: AC
Start: 2015-01-21 — End: 2015-01-21
  Administered 2015-01-21: 40 meq via ORAL
  Filled 2015-01-21: qty 2

## 2015-01-21 MED ORDER — DEXTROSE 5 % IV SOLN
1.0000 g | INTRAVENOUS | Status: DC
  Administered 2015-01-21: 1 g via INTRAVENOUS
  Filled 2015-01-21 (×2): qty 10

## 2015-01-21 MED ORDER — HYDRALAZINE HCL 20 MG/ML IJ SOLN: 10.0000 mg | Freq: Four times a day (QID) | INTRAMUSCULAR | Status: DC | PRN

## 2015-01-21 MED ORDER — INFLUENZA VAC SPLIT QUAD 0.5 ML IM SUSY
0.5000 mL | PREFILLED_SYRINGE | INTRAMUSCULAR | Status: AC
  Administered 2015-01-23: 0.5 mL via INTRAMUSCULAR
  Filled 2015-01-21 (×2): qty 0.5

## 2015-01-21 NOTE — Progress Notes (Signed)
[facility]                                                                              Patient Demographics  Nathan [name], is a 74 y.o. male, DOB - 10-13-1940, [place] date - 01/20/2015   Admitting Physician Allyne [name], MD  Outpatient Primary MD for the patient is [name], MD  LOS - 1   Chief Complaint  Patient presents with  . Fall  . Knee Pain  . Weakness       Brief HPI   Nathan [name] is a 74 y.o. male with history of atrial fibrillation, HTN, chronic anticoagulation, diverticulosis, CKD presented to the ED with falling episodes. Apparently the patient fell while trying to get out of the car. Patient sustained a knee injury as a result. Patient also complained about having generalized weakness. Patient was noted in the ED to have an elevated creatinine and was admitted for further assessment. Patient did report having right-sided chest wall pain after the fall and had no syncope. Patient had no nausea or vomiting noted. He has been on ACE inhibitor and also has been on diuretics.    Assessment & Plan    Principal Problem:   Acute renal failure superimposed on stage 3 chronic kidney disease ([place]) - Possibly due to dehydration, ACE inhibitor, Lasix, baseline creatinine appears to be around 1.5 - Continue to hold ACE inhibitor, Lasix, allopurinol - Continue gentle hydration, creatinine improving to 2.9 today. - If no significant improvement, will obtain renal ultrasound to rule out any obstruction  Active Problems:   Essential hypertension - Currently somewhat elevated, continue Cardizem, metoprolol - Hydralazine IV as needed with parameters    Atrial fibrillation (HCC) - Currently stable, continue Cardizem, metoprolol, - On Coumadin at home -INR currently supratherapeutic, 4.81, continue to hold Coumadin     Chronic anticoagulation - Continue to hold Coumadin for now, INR supratherapeutic    Fall - Complaining of  right chest wall pain, obtain rib series - Continue pain control - PTOT evaluation - rightKnee x-ray negative for any dislocation or fracture    Hypokalemia - Replaced  Code Status: Full code  Family Communication: Discussed in detail with the patient, all imaging results, lab results explained to the patient    Disposition Plan: Not medically ready  Time Spent in minutes 25 minutes  Procedures  Knee x-ray  Consults   None  DVT Prophylaxis INR currently supratherapeutic  Medications  Scheduled Meds: . cefTRIAXone (ROCEPHIN)  IV  1 g Intravenous Q24H  . diltiazem  360 mg Oral Daily  . finasteride  5 mg Oral Daily  . [START ON 01/22/2015] Influenza vac split quadrivalent PF  0.5 mL Intramuscular Tomorrow-1000  . metoprolol  100 mg Oral BID  . multivitamin with minerals  1 tablet Oral Daily  . sodium chloride  3 mL Intravenous Q12H  . Tdap  0.5 mL Intramuscular Once   Continuous Infusions: . sodium chloride 50 mL/hr at 01/21/15 0501   PRN Meds:.acetaminophen **OR** acetaminophen, ondansetron **OR** ondansetron (ZOFRAN) IV, oxyCODONE   Antibiotics   Anti-infectives    Start  Dose/Rate Route Frequency Ordered Stop   01/21/15 0900  cefTRIAXone (ROCEPHIN) 1 g in dextrose 5 % 50 mL IVPB     1 g 100 mL/hr over 30 Minutes Intravenous Every 24 hours 01/21/15 6629          Subjective:   Nathan [name] was seen and examined today. Denies any specific complaints except right chest wall pain, tender to touch and sore. Patient denies dizziness, shortness of breath, abdominal pain, N/V/D/C, new weakness, numbess, tingling. No acute events overnight.    Objective:   Blood pressure 159/85, pulse 106, temperature 98 F (36.7 C), temperature source Oral, resp. rate 20, height 6\' 1"  (1.854 m), weight 100.2 kg (220 lb 14.4 oz), SpO2 95 %.  Wt Readings from Last 3 Encounters:  01/20/15 100.2 kg (220 lb 14.4 oz)  10/30/14 100.699 kg (222 lb)  04/08/14 100.88 kg (222 lb 6.4 oz)      Intake/Output Summary (Last 24 hours) at 01/21/15 1225 Last data filed at 01/21/15 0844  Gross per 24 hour  Intake    817 ml  Output    350 ml  Net    467 ml    Exam  General: Alert and oriented x 3, NAD  HEENT:  PERRLA, EOMI, Anicteric Sclera, mucous membranes moist.   Neck: Supple, no JVD, no masses  CVS: S1 S2 auscultated, no rubs, murmurs or gallops. Regular rate and rhythm.  Respiratory: Rate chest wall tenderness to palpation , no wheezing, rales or rhonchi  Abdomen: Soft, nontender, nondistended, + bowel sounds  Ext: no cyanosis clubbing or edema  Neuro: AAOx3, Cr N's II- XII. Strength 5/5 upper and lower extremities bilaterally  Skin: No rashes  Psych: Normal affect and demeanor, alert and oriented x3    Data Review   Micro Results Recent Results (from the past 240 hour(s))  Urine culture     Status: None (Preliminary result)   Collection Time: 01/20/15  5:48 PM  Result Value Ref Range Status   Specimen Description URINE, CLEAN CATCH  Final   Special Requests Normal  Final   Culture CULTURE REINCUBATED FOR BETTER GROWTH  Final   Report Status PENDING  Incomplete    Radiology Reports Dg Knee Complete 4 Views Right  01/20/2015   CLINICAL DATA:  Fall.  Pain.  Initial evaluation.  EXAM: RIGHT KNEE - COMPLETE 4+ VIEW  COMPARISON:  None.  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 Hardware intact. No acute bony abnormality. Soft tissue ossification most likely postsurgical . Loose bodies within the joint space. Peripheral vascular calcification.  IMPRESSION: No acute abnormality. 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 Hardware intact.   Electronically Signed   By: Marcello Moores  Register   On: 01/20/2015 15:58    CBC  Recent Labs Lab 01/20/15 1604 01/21/15 0645  WBC 12.6* 9.5  HGB 13.9 12.5*  HCT 42.2 38.2*  PLT 208 188  MCV 96.6 96.2  MCH 31.8 31.5  MCHC 32.9 32.7  RDW 15.4 15.3  LYMPHSABS 0.4*  --     MONOABS 1.3*  --   EOSABS 0.0  --   BASOSABS 0.0  --     Chemistries   Recent Labs Lab 01/20/15 1604 01/21/15 0645  NA 139 138  K 3.9 3.3*  CL 106 107  CO2 21* 21*  GLUCOSE 100* 98  BUN 50* 43*  CREATININE 3.37* 2.91*  CALCIUM 8.5* 7.8*  AST 40 20  ALT 33 24  ALKPHOS 77 62  BILITOT 0.7 0.9   ------------------------------------------------------------------------------------------------------------------ estimated creatinine clearance is 26.9 mL/min (by C-G formula based on Cr of 2.91). ------------------------------------------------------------------------------------------------------------------ No results for input(s): HGBA1C in the last 72 hours. ------------------------------------------------------------------------------------------------------------------  Recent Labs  01/21/15 0645  CHOL 103  HDL 27*  LDLCALC 58  TRIG 92  CHOLHDL 3.8   ------------------------------------------------------------------------------------------------------------------ No results for input(s): TSH, T4TOTAL, T3FREE, THYROIDAB in the last 72 hours.  Invalid input(s): FREET3 ------------------------------------------------------------------------------------------------------------------ No results for input(s): VITAMINB12, FOLATE, FERRITIN, TIBC, IRON, RETICCTPCT in the last 72 hours.  Coagulation profile  Recent Labs Lab 01/20/15 1604 01/21/15 0645  INR 4.87* 4.81*    No results for input(s): DDIMER in the last 72 hours.  Cardiac Enzymes No results for input(s): CKMB, TROPONINI, MYOGLOBIN in the last 168 hours.  Invalid input(s): CK ------------------------------------------------------------------------------------------------------------------ Invalid input(s): POCBNP   Recent Labs  01/21/15 0744  GLUCAP 96     Kanasia Gayman M.D. [facility] 01/21/2015, 12:25 PM  Pager: [phone] Between 7am to 7pm - call Pager - [phone]  After 7pm go to  [email or web address] - password [email or web address]  Call night coverage person covering after 7pm

## 2015-01-21 NOTE — Evaluation (Signed)
Physical Therapy Evaluation Patient Details Name: Nathan [name] MRN: [id number] DOB: 10/08/1940 Today's Date: 01/21/2015   History of Present Illness  HPI: Nathan [name] is a 74 y.o. male with history of atrial fibrillation HTN chronic anticoagulation diverticulosis CKD presented to the ED with falling episodes. Apparently the patient fell while trying to get out of the car. Patient sustained a knee injury as a result. Patient has also been having generalized weakness. Patient was noted in the ED to have an elevated creatinine and is being admitted for further assessment. Patient had no chest pain and had no syncope.  Clinical Impression   Pt admitted with above diagnosis. Pt currently with functional limitations due to the deficits listed below (see PT Problem List).  Pt will benefit from skilled PT to increase their independence and safety with mobility to allow discharge to the venue listed below.       Follow Up Recommendations SNF    Equipment Recommendations  Rolling walker with 5" wheels;3in1 (PT)    Recommendations for Other Services OT consult     Precautions / Restrictions Precautions Precautions: Fall      Mobility  Bed Mobility Overal bed mobility: Needs Assistance Bed Mobility: Rolling;Sidelying to Sit Rolling: Mod assist Sidelying to sit: Mod assist       General bed mobility comments: Heavy mod assist to roll to side, clear LEs from bed; Heavy mod assist to elevate trunk to sitting; tended to extend hips, pitching weight posteriorly as he came to sit  Transfers Overall transfer level: Needs assistance Equipment used: Rolling walker (2 wheeled) Transfers: Sit to/from Stand Sit to Stand: Mod assist         General transfer comment: Heavy mod assist to rise; Unable to fully extend knees in standing; pt attributes this to arthritis  Ambulation/Gait Ambulation/Gait assistance: Min assist;Mod assist Ambulation Distance (Feet): 12 Feet Assistive device:  Rolling walker (2 wheeled) Gait Pattern/deviations: Decreased step length - right;Decreased step length - left;Trunk flexed     General Gait Details: mod to max cues to keep RW close, including mod assist at times for RW management; pt very anxious during walk; HR noted 145 max  Stairs            Wheelchair Mobility    Modified Rankin (Stroke Patients Only)       Balance Overall balance assessment: Needs assistance Sitting-balance support: Feet supported;Bilateral upper extremity supported Sitting balance-Leahy Scale: Poor Sitting balance - Comments: consistent posterior lean, which he was able to correct with verbal and tactile cueing, but would drift back into post lean within 15 seconds; fear of falling  Postural control: Posterior lean Standing balance support: Bilateral upper extremity supported Standing balance-Leahy Scale: Poor                               Pertinent Vitals/Pain Pain Assessment: Faces Faces Pain Scale: Hurts even more Pain Location: ribs and trunk (did  not specify a side worse than the other); pain at right knee abrasion; only noted grimace with bed mobility and sit<>stand Pain Descriptors / Indicators: Grimacing;Aching Pain Intervention(s): Monitored during session;Repositioned    Home Living Family/patient expects to be discharged to:: Private residence Living Arrangements: Children Available Help at Discharge: Family;Available PRN/intermittently (son works; Airline pilot) Type of Home: [facility] Access: Stairs to enter   Technical brewer of Steps: 2 Home Layout: One level Home Equipment: [place] - single point  Prior Function Level of Independence: Independent with assistive device(s)         Comments: Uses cane consistently; drives     Hand Dominance        Extremity/Trunk Assessment   Upper Extremity Assessment: Generalized weakness;Defer to OT evaluation           Lower Extremity Assessment:  Generalized weakness (Unable to fully extend bil knees in standing)         Communication   Communication: No difficulties  Cognition Arousal/Alertness: Awake/alert Behavior During Therapy: WFL for tasks assessed/performed Overall Cognitive Status: Within Functional Limits for tasks assessed                      General Comments      Exercises        Assessment/Plan    PT Assessment Patient needs continued PT services  PT Diagnosis Difficulty walking;Generalized weakness;Acute pain   PT Problem List Decreased strength;Decreased range of motion;Decreased activity tolerance;Decreased balance;Decreased mobility;Decreased coordination;Decreased knowledge of use of DME;Decreased knowledge of precautions;Pain  PT Treatment Interventions DME instruction;Gait training;Stair training;Functional mobility training;Therapeutic activities;Therapeutic exercise;Balance training;Patient/family education   PT Goals (Current goals can be found in the Care Plan section) Acute Rehab PT Goals Patient Stated Goal: wants his confidence back PT Goal Formulation: With patient Time For Goal Achievement: 02/04/15 Potential to Achieve Goals: Good    Frequency Min 3X/week   Barriers to discharge Decreased caregiver support Must be independent to manage at home    Co-evaluation               End of Session Equipment Utilized During Treatment: Gait belt Activity Tolerance: Patient tolerated treatment well Patient left: in chair;with call bell/phone within reach;with chair alarm set Nurse Communication: Mobility status         Time: 4540-9811 PT Time Calculation (min) (ACUTE ONLY): 26 min   Charges:   PT Evaluation $Initial PT Evaluation Tier I: 1 Procedure PT Treatments $Gait Training: 8-22 mins   PT G CodesRoney Marion Hamff 01/21/2015, 1:19 PM  Roney Marion, [place] Pager [phone] Office [phone]

## 2015-01-22 ENCOUNTER — Inpatient Hospital Stay (HOSPITAL_COMMUNITY): Payer: Medicare Other

## 2015-01-22 DIAGNOSIS — W19XXXD Unspecified fall, subsequent encounter: Secondary | ICD-10-CM

## 2015-01-22 DIAGNOSIS — L8990 Pressure ulcer of unspecified site, unspecified stage: Secondary | ICD-10-CM | POA: Insufficient documentation

## 2015-01-22 LAB — BASIC METABOLIC PANEL
Anion gap: 11 (ref 5–15)
BUN: 41 mg/dL — ABNORMAL HIGH (ref 6–20)
CO2: 19 mmol/L — ABNORMAL LOW (ref 22–32)
Calcium: 7.9 mg/dL — ABNORMAL LOW (ref 8.9–10.3)
Chloride: 110 mmol/L (ref 101–111)
Creatinine, Ser: 2.6 mg/dL — ABNORMAL HIGH (ref 0.61–1.24)
GFR calc Af Amer: 26 mL/min — ABNORMAL LOW (ref >60)
GFR calc non Af Amer: 22 mL/min — ABNORMAL LOW (ref >60)
Glucose, Bld: 95 mg/dL (ref 65–99)
Potassium: 3.9 mmol/L (ref 3.5–5.1)
Sodium: 140 mmol/L (ref 135–145)

## 2015-01-22 LAB — HEMOGLOBIN A1C
Hgb A1c MFr Bld: 5.4 % (ref 4.8–5.6)
Mean Plasma Glucose: 108 mg/dL

## 2015-01-22 LAB — GLUCOSE, CAPILLARY
Glucose-Capillary: 100 mg/dL — ABNORMAL HIGH (ref 65–99)
Glucose-Capillary: 138 mg/dL — ABNORMAL HIGH (ref 65–99)

## 2015-01-22 LAB — PROTIME-INR
INR: 3.73 — ABNORMAL HIGH (ref 0.00–1.49)
INR: 3.64 — ABNORMAL HIGH (ref 0.00–1.49)
Prothrombin Time: 36.1 seconds — ABNORMAL HIGH (ref 11.6–15.2)
Prothrombin Time: 35.4 seconds — ABNORMAL HIGH (ref 11.6–15.2)

## 2015-01-22 MED ORDER — IOHEXOL 300 MG/ML  SOLN
25.0000 mL | INTRAMUSCULAR | Status: AC
  Administered 2015-01-22 (×2): 25 mL via ORAL

## 2015-01-22 NOTE — Progress Notes (Signed)
Incentive spirometry given to patient and instructed on use. Patient returned demonstration.

## 2015-01-22 NOTE — Progress Notes (Addendum)
ANTICOAGULATION CONSULT NOTE - Initial Consult  Pharmacy Consult for Coumadin Indication: atrial fibrillation  Allergies  Allergen Reactions  . Tramadol Nausea And Vomiting    Patient Measurements: Height: 6\' 1"  (185.4 cm) Weight: 223 lb 5.2 oz (101.3 kg) IBW/kg (Calculated) : 79.9 Heparin Dosing Weight:   Vital Signs: Temp: 98.9 F (37.2 C) (10/06 0754) Temp Source: Oral (10/06 0754) BP: 161/89 mmHg (10/06 0754) Pulse Rate: 100 (10/06 0754)  Labs:  Recent Labs  01/20/15 1604 01/21/15 0645 01/22/15 0545  HGB 13.9 12.5*  --   HCT 42.2 38.2*  --   PLT 208 188  --   LABPROT 44.0* 43.7*  --   INR 4.87* 4.81*  --   CREATININE 3.37* 2.91* 2.60*    Estimated Creatinine Clearance: 30.3 mL/min (by C-G formula based on Cr of 2.6).   Medical History: Past Medical History  Diagnosis Date  . Permanent atrial fibrillation ([place])   . Hypertension   . Chronic renal impairment   . DJD (degenerative joint disease)   . Diverticulosis   . Elevated PSA     Dr Junious Silk  . Infected prosthetic knee joint ([place])     h/o Group B streptococcal prosthetic knee infection  . Cancer ([place])     skin  . PONV (postoperative nausea and vomiting)     no  problems recently  . H/O hiatal hernia     Medications:  Scheduled:  . cefTRIAXone (ROCEPHIN)  IV  1 g Intravenous Q24H  . diltiazem  360 mg Oral Daily  . finasteride  5 mg Oral Daily  . Influenza vac split quadrivalent PF  0.5 mL Intramuscular Tomorrow-1000  . metoprolol  100 mg Oral BID  . multivitamin with minerals  1 tablet Oral Daily  . sodium chloride  3 mL Intravenous Q12H  . Tdap  0.5 mL Intramuscular Once    Assessment: 74yo male admitted after fall at home when getting out of car.  INR supratherapeutic on home dose of Coumadin 5mg  daily x 2.5mg  MonFri.  No INR, CBC this AM.  No bleeding noted.    Goal of Therapy:  INR 2-3 Monitor platelets by anticoagulation protocol: Yes   Plan:  INR now, then daily  Gracy Bruins, PharmD [facility]   01/22/2015 1300.  Follow-up INR 3.65 today.    1- No Coumadin 2- F/U daily INR 3- Watch for s/s of bleeding  Gracy Bruins, PharmD [facility]

## 2015-01-22 NOTE — Progress Notes (Signed)
[facility]                                                                              Patient Demographics  Nathan [name], is a 74 y.o. male, DOB - [birth year], [place] date - 01/20/2015   Admitting Physician Allyne [name], MD  Outpatient Primary MD for the patient is [name], MD  LOS - 2   Chief Complaint  Patient presents with  . Fall  . Knee Pain  . Weakness       Brief HPI   [name] is a 74 y.o. male with history of atrial fibrillation, HTN, chronic anticoagulation, diverticulosis, CKD presented to the ED with falling episodes. Apparently the patient fell while trying to get out of the car. Patient sustained a knee injury as a result. Patient also complained about having generalized weakness. Patient was noted in the ED to have an elevated creatinine and was admitted for further assessment. Patient did report having right-sided chest wall pain after the fall and had no syncope. Patient had no nausea or vomiting noted. He has been on ACE inhibitor and also has been on diuretics.    Assessment & Plan    Principal Problem:   Acute renal failure superimposed on stage 3 chronic kidney disease ([place]) - Possibly due to dehydration, ACE inhibitor, Lasix, baseline creatinine appears to be around 1.5 - Continue to hold ACE inhibitor, Lasix, allopurinol - Continue gentle hydration, creatinine 2.6, renal ultrasound showed left-sided hydronephrosis, obtain CT of the abdomen and pelvis without contrast, will get urology consult  Active Problems:   Essential hypertension - better this am, continue Cardizem, metoprolol - Hydralazine IV as needed with parameters    Atrial fibrillation (HCC) on chronic anticoagulation - Currently stable, continue Cardizem, metoprolol, - On Coumadin at home, had so far as INR was supratherapeutic, repeat PT/INR today    Fall with multiple rib fractures - Rib series showed right 7,8, 9, 10th rib fractures   - Continue pain control - Incentive spirometry - rightKnee x-ray negative for any dislocation or fracture    Hypokalemia - Resolved  Code Status: Full code  Family Communication: Discussed in detail with the patient, all imaging results, lab results explained to the patient    Disposition Plan: Not medically ready , will need skilled nursing facility Time Spent in minutes 25 minutes  Procedures  Knee x-ray  Consults   None   DVT Prophylaxis awaiting PT/INR  Medications  Scheduled Meds: . cefTRIAXone (ROCEPHIN)  IV  1 g Intravenous Q24H  . diltiazem  360 mg Oral Daily  . finasteride  5 mg Oral Daily  . Influenza vac split quadrivalent PF  0.5 mL Intramuscular Tomorrow-1000  . metoprolol  100 mg Oral BID  . multivitamin with minerals  1 tablet Oral Daily  . sodium chloride  3 mL Intravenous Q12H  . Tdap  0.5 mL Intramuscular Once   Continuous Infusions: . sodium chloride 100 mL/hr at 01/22/15 0726   PRN Meds:.acetaminophen **OR** acetaminophen, hydrALAZINE, ondansetron **OR** ondansetron (ZOFRAN) IV, oxyCODONE   Antibiotics   Anti-infectives    Start  Dose/Rate Route Frequency Ordered Stop   01/21/15 0900  cefTRIAXone (ROCEPHIN) 1 g in dextrose 5 % 50 mL IVPB     1 g 100 mL/hr over 30 Minutes Intravenous Every 24 hours 01/21/15 5009          Subjective:   Nathan [name] was seen and examined today. Continues to have right chest wall pain, rib fractures. No fevers or chills.  Patient denies dizziness, shortness of breath, abdominal pain, N/V/D/C, new weakness, numbess, tingling. No acute events overnight.    Objective:   Blood pressure 161/89, pulse 100, temperature 98.9 F (37.2 C), temperature source Oral, resp. rate 18, height 6\' 1"  (1.854 m), weight 101.3 kg (223 lb 5.2 oz), SpO2 99 %.  Wt Readings from Last 3 Encounters:  01/21/15 101.3 kg (223 lb 5.2 oz)  10/30/14 100.699 kg (222 lb)  04/08/14 100.88 kg (222 lb 6.4 oz)     Intake/Output  Summary (Last 24 hours) at 01/22/15 1020 Last data filed at 01/22/15 1012  Gross per 24 hour  Intake 1646.25 ml  Output    950 ml  Net 696.25 ml    Exam  General: Alert and oriented x 3, NAD  HEENT:  PERRLA, EOMI, Anicteric Sclera, mucous membranes moist.   Neck: Supple, no JVD, no masses  CVS: S1 S2 clear, no mrg  Respiratory: right chest wall tenderness to palpation , no wheezing, rales or rhonchi  Abdomen: Soft, nontender, nondistended, + bowel sounds  Ext: no cyanosis clubbing or edema  Neuro: no new deficits  Skin: No rashes  Psych: Normal affect and demeanor, alert and oriented x3    Data Review   Micro Results Recent Results (from the past 240 hour(s))  Urine culture     Status: None   Collection Time: 01/20/15  5:48 PM  Result Value Ref Range Status   Specimen Description URINE, CLEAN CATCH  Final   Special Requests Normal  Final   Culture MULTIPLE SPECIES PRESENT, SUGGEST RECOLLECTION  Final   Report Status 01/21/2015 FINAL  Final    Radiology Reports Dg Ribs Unilateral W/chest Right  01/21/2015   CLINICAL DATA:  Pain.  Fall.  Initial evaluation.  EXAM: RIGHT RIBS AND CHEST - 3+ VIEW  COMPARISON:  02/25/2013.  FINDINGS: Cardiomegaly low lung volumes with basilar atelectasis. Right posterior lateral seventh, eighth, ninth, and tenth rib fractures are noted. These are angulated and displaced. No pneumothorax.  IMPRESSION: Right posterior lateral seventh, eighth, ninth, and tenth angulated and displaced rib fractures. No pneumothorax.   Electronically Signed   By: Marcello Moores  Register   On: 01/21/2015 13:54   US Renal  01/22/2015   CLINICAL DATA:  Acute renal failure. History hypertension, chronic renal and impairment.  EXAM: RENAL / URINARY TRACT ULTRASOUND COMPLETE  COMPARISON:  08/15/2007  FINDINGS: Right Kidney:  Length: 12.7 cm. Upper pole cyst is 9.6 x 10.2 x 8.7 cm. Midpole cyst is 2.4 x 2.3 x 2.1 cm. Lower pole cyst is 1.9 x 1.3 x 1.8 cm. No hydronephrosis.   Left Kidney:  Length: 12.8 cm. Mild hydronephrosis is present. Small cysts are identified in the midpole region, 2.4 x 1.7 x 2.4 cm, in the lower pole region measuring 2.0 x 2.0 x 2.3 cm and 1.6 x 1.6 x 1.5 cm. No solid mass.  Bladder:  Appears normal for degree of bladder distention.  IMPRESSION: 1. Bilateral renal cysts. 2. Mild left hydronephrosis. Consider further evaluation is CT of the abdomen and pelvis.   Electronically Signed  By: Nolon Nations M.D.   On: 01/22/2015 09:15   Dg Knee Complete 4 Views Right  01/20/2015   CLINICAL DATA:  Fall.  Pain.  Initial evaluation.  EXAM: RIGHT KNEE - COMPLETE 4+ VIEW  COMPARISON:  None.  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 Hardware intact. No acute bony abnormality. Soft tissue ossification most likely postsurgical . Loose bodies within the joint space. Peripheral vascular calcification.  IMPRESSION: No acute abnormality. 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 Hardware intact.   Electronically Signed   By: Marcello Moores  Register   On: 01/20/2015 15:58    CBC  Recent Labs Lab 01/20/15 1604 01/21/15 0645  WBC 12.6* 9.5  HGB 13.9 12.5*  HCT 42.2 38.2*  PLT 208 188  MCV 96.6 96.2  MCH 31.8 31.5  MCHC 32.9 32.7  RDW 15.4 15.3  LYMPHSABS 0.4*  --   MONOABS 1.3*  --   EOSABS 0.0  --   BASOSABS 0.0  --     Chemistries   Recent Labs Lab 01/20/15 1604 01/21/15 0645 01/22/15 0545  NA 139 138 140  K 3.9 3.3* 3.9  CL 106 107 110  CO2 21* 21* 19*  GLUCOSE 100* 98 95  BUN 50* 43* 41*  CREATININE 3.37* 2.91* 2.60*  CALCIUM 8.5* 7.8* 7.9*  AST 40 20  --   ALT 33 24  --   ALKPHOS 77 62  --   BILITOT 0.7 0.9  --    ------------------------------------------------------------------------------------------------------------------ estimated creatinine clearance is 30.3 mL/min (by C-G formula based on Cr of  2.6). ------------------------------------------------------------------------------------------------------------------  Recent Labs  01/20/15 1604  HGBA1C 5.4   ------------------------------------------------------------------------------------------------------------------  Recent Labs  01/21/15 0645  CHOL 103  HDL 27*  LDLCALC 58  TRIG 92  CHOLHDL 3.8   ------------------------------------------------------------------------------------------------------------------ No results for input(s): TSH, T4TOTAL, T3FREE, THYROIDAB in the last 72 hours.  Invalid input(s): FREET3 ------------------------------------------------------------------------------------------------------------------ No results for input(s): VITAMINB12, FOLATE, FERRITIN, TIBC, IRON, RETICCTPCT in the last 72 hours.  Coagulation profile  Recent Labs Lab 01/20/15 1604 01/21/15 0645  INR 4.87* 4.81*    No results for input(s): DDIMER in the last 72 hours.  Cardiac Enzymes No results for input(s): CKMB, TROPONINI, MYOGLOBIN in the last 168 hours.  Invalid input(s): CK ------------------------------------------------------------------------------------------------------------------ Invalid input(s): POCBNP   Recent Labs  01/21/15 0744 01/22/15 0745  GLUCAP 96 100*     Marquail Bradwell M.D. [facility] 01/22/2015, 10:20 AM  Pager: [phone] Between 7am to 7pm - call Pager - [phone]  After 7pm go to [email or web address] - password [email or web address]  Call night coverage person covering after 7pm

## 2015-01-23 DIAGNOSIS — N179 Acute kidney failure, unspecified: Secondary | ICD-10-CM | POA: Diagnosis not present

## 2015-01-23 LAB — BASIC METABOLIC PANEL
Anion gap: 10 (ref 5–15)
BUN: 34 mg/dL — ABNORMAL HIGH (ref 6–20)
CO2: 20 mmol/L — ABNORMAL LOW (ref 22–32)
Calcium: 7.7 mg/dL — ABNORMAL LOW (ref 8.9–10.3)
Chloride: 108 mmol/L (ref 101–111)
Creatinine, Ser: 2.15 mg/dL — ABNORMAL HIGH (ref 0.61–1.24)
GFR calc Af Amer: 33 mL/min — ABNORMAL LOW (ref >60)
GFR calc non Af Amer: 28 mL/min — ABNORMAL LOW (ref >60)
Glucose, Bld: 91 mg/dL (ref 65–99)
Potassium: 3.9 mmol/L (ref 3.5–5.1)
Sodium: 138 mmol/L (ref 135–145)

## 2015-01-23 LAB — PROTIME-INR
INR: 3.22 — ABNORMAL HIGH (ref 0.00–1.49)
Prothrombin Time: 32.3 seconds — ABNORMAL HIGH (ref 11.6–15.2)

## 2015-01-23 LAB — GLUCOSE, CAPILLARY: Glucose-Capillary: 86 mg/dL (ref 65–99)

## 2015-01-23 NOTE — Consult Note (Signed)
Urology Consult  Referring physician: Dr. Tana Coast Reason for referral: ARF, hydronephrosis  Chief Complaint: right flank pain, urinary incontinence  History of Present Illness: Mr Nathan [name] is a 74yo with multiple medical problems who was admitted on 10/4 with altered mental status, right rib fracture, and ARF. He was recently started on lasix and it his ARF was believed to be due to dehydration. With hydration his creatinine improved from 3.3 to 2.6. Renal US obtained showed mild left hydro and right pelvic fullness. He was complaining of urgency, frequency, and continuous incontinence. He had no issues with LUTS prior to starting lasix. A foley catheter was placed and 600cc drained immediately and over the course of the day he made 2 L of urine. He is not on any BPH meds. He denies any hx of gross hematuria. No hx of nephrolithiasis.   Past Medical History  Diagnosis Date  . Permanent atrial fibrillation (Nathan [name])   . Hypertension   . Chronic renal impairment   . DJD (degenerative joint disease)   . Diverticulosis   . Elevated PSA     Dr Junious Silk  . Infected prosthetic knee joint ([place])     h/o Group B streptococcal prosthetic knee infection  . Cancer ([place])     skin  . PONV (postoperative nausea and vomiting)     no  problems recently  . H/O hiatal hernia    Past Surgical History  Procedure Laterality Date  . 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 Total hip arthroplasty Right 07  . Colonoscopy  2003  . Hiatal hernia repair      Dr Kaylyn Lim , laparoscopic  . Hernia repair    . Inguinal hernia repair Left 03/05/2013    Procedure: HERNIA REPAIR INGUINAL ADULT;  Surgeon: [name], MD;  Location: [facility];  Service: General;  Laterality: Left;  . Insertion of mesh Left 03/05/2013    Procedure: INSERTION OF MESH;  Surgeon: [name], MD;  Location: [facility];  Service: General;  Laterality: Left;    Medications: I have reviewed the patient's current medications. Allergies:   Allergies  Allergen Reactions  . Tramadol Nausea And Vomiting    Family History  Problem Relation Age of Onset  . Hypertension Mother   . Skin cancer Mother   . Brain cancer Sister   . Hypertension Brother   . Diabetes Brother   . Stroke Neg Hx   . Heart disease Neg Hx    Social History:  reports that he quit smoking about 23 years ago. His smoking use included Cigarettes. He has a .75 pack-year smoking history. He has never used smokeless tobacco. He reports that he drinks about 1.2 oz of alcohol per week. He reports that he does not use illicit drugs.  Review of Systems  Genitourinary: Positive for urgency, frequency and flank pain.  All other systems reviewed and are negative.   Physical Exam:  Vital signs in last 24 hours: Temp:  [97.8 F (36.6 C)-99.3 F (37.4 C)] 97.8 F (36.6 C) (10/07 0437) Pulse Rate:  [69-100] 69 (10/07 0437) Resp:  [18-20] 20 (10/07 0437) BP: (140-171)/(89-98) 140/96 mmHg (10/07 0437) SpO2:  [91 %-99 %] 91 % (10/07 0437) Weight:  [100.6 kg (221 lb 12.5 oz)] 100.6 kg (221 lb 12.5 oz) (10/06 2007) Physical Exam  Constitutional: He is oriented to person, place, and time. He appears well-developed and well-nourished.  HENT:  Head: Normocephalic and atraumatic.  Eyes: EOM are normal. Pupils are equal, round,  and reactive to light.  Neck: Normal range of motion. No thyromegaly present.  Cardiovascular: Normal rate and regular rhythm.   Respiratory: Effort normal. No respiratory distress.  GI: Soft. He exhibits no distension. Hernia confirmed negative in the right inguinal area and confirmed negative in the left inguinal area.  Genitourinary: Rectum normal, testes normal and penis normal. Rectal exam shows anal tone normal. Prostate is enlarged. Prostate is not tender. Right testis shows no mass. Left testis shows no mass. Circumcised. No penile erythema or penile tenderness.  Musculoskeletal: Normal range of motion.  Neurological: He is alert and  oriented to person, place, and time.  Skin: Skin is warm and dry.  Psychiatric: He has a normal mood and affect. His behavior is normal. Judgment and thought content normal.    Laboratory Data:  Results for orders placed or performed during the hospital encounter of 01/20/15 (from the past 72 hour(s))  Comprehensive metabolic panel     Status: Abnormal   Collection Time: 01/20/15  4:04 PM  Result Value Ref Range   Sodium 139 135 - 145 mmol/L   Potassium 3.9 3.5 - 5.1 mmol/L   Chloride 106 101 - 111 mmol/L   CO2 21 (L) 22 - 32 mmol/L   Glucose, Bld 100 (H) 65 - 99 mg/dL   BUN 50 (H) 6 - 20 mg/dL   Creatinine, Ser 3.37 (H) 0.61 - 1.24 mg/dL   Calcium 8.5 (L) 8.9 - 10.3 mg/dL   Total Protein 6.9 6.5 - 8.1 g/dL   Albumin 2.8 (L) 3.5 - 5.0 g/dL   AST 40 15 - 41 U/L   ALT 33 17 - 63 U/L   Alkaline Phosphatase 77 38 - 126 U/L   Total Bilirubin 0.7 0.3 - 1.2 mg/dL   GFR calc non Af Amer 16 (L) >60 mL/min   GFR calc Af Amer 19 (L) >60 mL/min    Comment: (NOTE) The eGFR has been calculated using the CKD EPI equation. This calculation has not been validated in all clinical situations. eGFR's persistently <60 mL/min signify possible Chronic Kidney Disease.    Anion gap 12 5 - 15  CBC with Differential     Status: Abnormal   Collection Time: 01/20/15  4:04 PM  Result Value Ref Range   WBC 12.6 (H) 4.0 - 10.5 K/uL   RBC 4.37 4.22 - 5.81 MIL/uL   Hemoglobin 13.9 13.0 - 17.0 g/dL   HCT 42.2 39.0 - 52.0 %   MCV 96.6 78.0 - 100.0 fL   MCH 31.8 26.0 - 34.0 pg   MCHC 32.9 30.0 - 36.0 g/dL   RDW 15.4 11.5 - 15.5 %   Platelets 208 150 - 400 K/uL   Neutrophils Relative % 87 %   Neutro Abs 11.0 (H) 1.7 - 7.7 K/uL   Lymphocytes Relative 3 %   Lymphs Abs 0.4 (L) 0.7 - 4.0 K/uL   Monocytes Relative 10 %   Monocytes Absolute 1.3 (H) 0.1 - 1.0 K/uL   Eosinophils Relative 0 %   Eosinophils Absolute 0.0 0.0 - 0.7 K/uL   Basophils Relative 0 %   Basophils Absolute 0.0 0.0 - 0.1 K/uL   Protime-INR     Status: Abnormal   Collection Time: 01/20/15  4:04 PM  Result Value Ref Range   Prothrombin Time 44.0 (H) 11.6 - 15.2 seconds   INR 4.87 (H) 0.00 - 1.49  Hemoglobin A1c     Status: None   Collection Time: 01/20/15  4:04 PM  Result Value Ref Range   Hgb A1c MFr Bld 5.4 4.8 - 5.6 %    Comment: (NOTE)         Pre-diabetes: 5.7 - 6.4         Diabetes: >6.4         Glycemic control for adults with diabetes: <7.0    Mean Plasma Glucose 108 mg/dL    Comment: (NOTE) Performed At: [facility] [place], [place] [id number] Lindon Romp MD BE:[id number]   Urinalysis, Routine w reflex microscopic     Status: Abnormal   Collection Time: 01/20/15  5:48 PM  Result Value Ref Range   Color, Urine YELLOW YELLOW   APPearance HAZY (A) CLEAR   Specific Gravity, Urine 1.021 1.005 - 1.030   pH 5.0 5.0 - 8.0   Glucose, UA NEGATIVE NEGATIVE mg/dL   Hgb urine dipstick SMALL (A) NEGATIVE   Bilirubin Urine NEGATIVE NEGATIVE   Ketones, ur NEGATIVE NEGATIVE mg/dL   Protein, ur 100 (A) NEGATIVE mg/dL   Urobilinogen, UA 1.0 0.0 - 1.0 mg/dL   Nitrite NEGATIVE NEGATIVE   Leukocytes, UA SMALL (A) NEGATIVE  Urine culture     Status: None   Collection Time: 01/20/15  5:48 PM  Result Value Ref Range   Specimen Description URINE, CLEAN CATCH    Special Requests Normal    Culture MULTIPLE SPECIES PRESENT, SUGGEST RECOLLECTION    Report Status 01/21/2015 FINAL   Urine microscopic-add on     Status: Abnormal   Collection Time: 01/20/15  5:48 PM  Result Value Ref Range   Squamous Epithelial / LPF RARE RARE   WBC, UA 3-6 <3 WBC/hpf   RBC / HPF 0-2 <3 RBC/hpf   Bacteria, UA RARE RARE   Casts GRANULAR CAST (A) NEGATIVE  Lipid panel     Status: Abnormal   Collection Time: 01/21/15  6:45 AM  Result Value Ref Range   Cholesterol 103 0 - 200 mg/dL   Triglycerides 92 <150 mg/dL   HDL 27 (L) >40 mg/dL   Total CHOL/HDL Ratio 3.8 RATIO   VLDL 18 0 - 40 mg/dL   LDL  Cholesterol 58 0 - 99 mg/dL    Comment:        Total Cholesterol/HDL:CHD Risk Coronary Heart Disease Risk Table                     Men   Women  1/2 Average Risk   3.4   3.3  Average Risk       5.0   4.4  2 X Average Risk   9.6   7.1  3 X Average Risk  23.4   11.0        Use the calculated Patient Ratio above and the CHD Risk Table to determine the patient's CHD Risk.        ATP III CLASSIFICATION (LDL):  <100     mg/dL   Optimal  100-129  mg/dL   Near or Above                    Optimal  130-159  mg/dL   Borderline  160-189  mg/dL   High  >190     mg/dL   Very High   CBC     Status: Abnormal   Collection Time: 01/21/15  6:45 AM  Result Value Ref Range   WBC 9.5 4.0 - 10.5 K/uL   RBC 3.97 (L) 4.22 - 5.81  MIL/uL   Hemoglobin 12.5 (L) 13.0 - 17.0 g/dL   HCT 38.2 (L) 39.0 - 52.0 %   MCV 96.2 78.0 - 100.0 fL   MCH 31.5 26.0 - 34.0 pg   MCHC 32.7 30.0 - 36.0 g/dL   RDW 15.3 11.5 - 15.5 %   Platelets 188 150 - 400 K/uL  Protime-INR     Status: Abnormal   Collection Time: 01/21/15  6:45 AM  Result Value Ref Range   Prothrombin Time 43.7 (H) 11.6 - 15.2 seconds   INR 4.81 (H) 0.00 - 1.49  Comprehensive metabolic panel     Status: Abnormal   Collection Time: 01/21/15  6:45 AM  Result Value Ref Range   Sodium 138 135 - 145 mmol/L   Potassium 3.3 (L) 3.5 - 5.1 mmol/L   Chloride 107 101 - 111 mmol/L   CO2 21 (L) 22 - 32 mmol/L   Glucose, Bld 98 65 - 99 mg/dL   BUN 43 (H) 6 - 20 mg/dL   Creatinine, Ser 2.91 (H) 0.61 - 1.24 mg/dL   Calcium 7.8 (L) 8.9 - 10.3 mg/dL   Total Protein 5.5 (L) 6.5 - 8.1 g/dL   Albumin 2.4 (L) 3.5 - 5.0 g/dL   AST 20 15 - 41 U/L   ALT 24 17 - 63 U/L   Alkaline Phosphatase 62 38 - 126 U/L   Total Bilirubin 0.9 0.3 - 1.2 mg/dL   GFR calc non Af Amer 20 (L) >60 mL/min   GFR calc Af Amer 23 (L) >60 mL/min    Comment: (NOTE) The eGFR has been calculated using the CKD EPI equation. This calculation has not been validated in all clinical  situations. eGFR's persistently <60 mL/min signify possible Chronic Kidney Disease.    Anion gap 10 5 - 15  Glucose, capillary     Status: None   Collection Time: 01/21/15  7:44 AM  Result Value Ref Range   Glucose-Capillary 96 65 - 99 mg/dL  Basic metabolic panel     Status: Abnormal   Collection Time: 01/22/15  5:45 AM  Result Value Ref Range   Sodium 140 135 - 145 mmol/L   Potassium 3.9 3.5 - 5.1 mmol/L   Chloride 110 101 - 111 mmol/L   CO2 19 (L) 22 - 32 mmol/L   Glucose, Bld 95 65 - 99 mg/dL   BUN 41 (H) 6 - 20 mg/dL   Creatinine, Ser 2.60 (H) 0.61 - 1.24 mg/dL   Calcium 7.9 (L) 8.9 - 10.3 mg/dL   GFR calc non Af Amer 22 (L) >60 mL/min   GFR calc Af Amer 26 (L) >60 mL/min    Comment: (NOTE) The eGFR has been calculated using the CKD EPI equation. This calculation has not been validated in all clinical situations. eGFR's persistently <60 mL/min signify possible Chronic Kidney Disease.    Anion gap 11 5 - 15  Glucose, capillary     Status: Abnormal   Collection Time: 01/22/15  7:45 AM  Result Value Ref Range   Glucose-Capillary 100 (H) 65 - 99 mg/dL  Protime-INR     Status: Abnormal   Collection Time: 01/22/15 11:25 AM  Result Value Ref Range   Prothrombin Time 36.1 (H) 11.6 - 15.2 seconds   INR 3.73 (H) 0.00 - 1.49  Glucose, capillary     Status: Abnormal   Collection Time: 01/22/15 11:31 AM  Result Value Ref Range   Glucose-Capillary 138 (H) 65 - 99 mg/dL  Protime-INR  Status: Abnormal   Collection Time: 01/22/15 11:36 AM  Result Value Ref Range   Prothrombin Time 35.4 (H) 11.6 - 15.2 seconds   INR 3.64 (H) 0.00 - 7.39  Basic metabolic panel     Status: Abnormal   Collection Time: 01/23/15  5:00 AM  Result Value Ref Range   Sodium 138 135 - 145 mmol/L   Potassium 3.9 3.5 - 5.1 mmol/L   Chloride 108 101 - 111 mmol/L   CO2 20 (L) 22 - 32 mmol/L   Glucose, Bld 91 65 - 99 mg/dL   BUN 34 (H) 6 - 20 mg/dL   Creatinine, Ser 2.15 (H) 0.61 - 1.24 mg/dL    Calcium 7.7 (L) 8.9 - 10.3 mg/dL   GFR calc non Af Amer 28 (L) >60 mL/min   GFR calc Af Amer 33 (L) >60 mL/min    Comment: (NOTE) The eGFR has been calculated using the CKD EPI equation. This calculation has not been validated in all clinical situations. eGFR's persistently <60 mL/min signify possible Chronic Kidney Disease.    Anion gap 10 5 - 15  Protime-INR     Status: Abnormal   Collection Time: 01/23/15  5:00 AM  Result Value Ref Range   Prothrombin Time 32.3 (H) 11.6 - 15.2 seconds   INR 3.22 (H) 0.00 - 1.49   Recent Results (from the past 240 hour(s))  Urine culture     Status: None   Collection Time: 01/20/15  5:48 PM  Result Value Ref Range Status   Specimen Description URINE, CLEAN CATCH  Final   Special Requests Normal  Final   Culture MULTIPLE SPECIES PRESENT, SUGGEST RECOLLECTION  Final   Report Status 01/21/2015 FINAL  Final   Creatinine:  Recent Labs  01/20/15 1604 01/21/15 0645 01/22/15 0545 01/23/15 0500  CREATININE 3.37* 2.91* 2.60* 2.15*   Baseline Creatinine: unknown  Impression/Assessment:  74yo with ARF, mild bilateral hydronephrosis, BPH with LUTS, urinary retention  Plan:  1. Continue indwelling foley to straight drain 2. Continue to trend creatinine 3. Agree with ordering CT scan to assess cause of hydronephrosis  Liliani Bobo L 01/23/2015, 7:42 AM

## 2015-01-23 NOTE — Progress Notes (Signed)
[facility]                                                                              Patient Demographics  Nathan [name], is a 74 y.o. male, DOB - 1940-07-05, [place] date - 01/20/2015   Admitting Physician Allyne [name], MD  Outpatient Primary MD for the patient is [name], MD  LOS - 3   Chief Complaint  Patient presents with  . Fall  . Knee Pain  . Weakness       Brief HPI   LUISDAVID [name] is a 74 y.o. male with history of atrial fibrillation, HTN, chronic anticoagulation, diverticulosis, CKD presented to the ED with falling episodes. Apparently the patient fell while trying to get out of the car. Patient sustained a knee injury as a result. Patient also complained about having generalized weakness. Patient was noted in the ED to have an elevated creatinine and was admitted for further assessment. Patient did report having right-sided chest wall pain after the fall and had no syncope. Patient had no nausea or vomiting noted. He has been on ACE inhibitor and also has been on diuretics.    Assessment & Plan    Principal Problem:   Acute renal failure superimposed on stage 3 chronic kidney disease [facility]): Presenting with creatinine of 3.37, baseline 1.4 - Continue to hold ACE inhibitor, Lasix, allopurinol baseline creatinine appears to be around 1.5 -  renal ultrasound showed left-sided hydronephrosis - CT of the abdomen and pelvis showed obstructing 7 mmHg left ureteral calculus, bilateral renal lesions, recommended further evaluation - Urology consult called, seen by Dr. Noah Delaine today, follow urology recommendations  Active Problems:   Essential hypertension - continue Cardizem, metoprolol - Hydralazine IV as needed with parameters    Atrial fibrillation ([place]) on chronic anticoagulation - Currently stable, continue Cardizem, metoprolol, - On Coumadin at home, had so far as INR was supratherapeutic, - continue to hold Coumadin  for possible urological procedure, follow urology recommendations    Fall with multiple rib fractures - Rib series showed right 7,8, 9, 10th rib fractures  - Continue pain control - Incentive spirometry - rightKnee x-ray negative for any dislocation or fracture    Hypokalemia - Resolved  Code Status: Full code   Family Communication: Discussed in detail with the patient, all imaging results, lab results explained to the patient    Disposition Plan: Not medically ready , will need skilled nursing facility, he is agreeable  Time Spent in minutes 25 minutes  Procedures  Knee x-ray Renal ultrasound CT abdomen and pelvis  Consults   Urology  DVT Prophylaxis INR still 3.22, Coumadin on hold, place SCDs.   Medications  Scheduled Meds: . diltiazem  360 mg Oral Daily  . finasteride  5 mg Oral Daily  . metoprolol  100 mg Oral BID  . multivitamin with minerals  1 tablet Oral Daily  . sodium chloride  3 mL Intravenous Q12H  . Tdap  0.5 mL Intramuscular Once   Continuous Infusions: . sodium chloride 100 mL/hr at 01/22/15 1711   PRN Meds:.acetaminophen **OR** acetaminophen, hydrALAZINE, ondansetron **OR** ondansetron (ZOFRAN) IV,  oxyCODONE   Antibiotics   Anti-infectives    Start     Dose/Rate Route Frequency Ordered Stop   01/21/15 0900  cefTRIAXone (ROCEPHIN) 1 g in dextrose 5 % 50 mL IVPB  Status:  Discontinued     1 g 100 mL/hr over 30 Minutes Intravenous Every 24 hours 01/21/15 0752 01/22/15 1123        Subjective:   Nathan [name] was seen and examined today. Continues to have right chest wall pain due to rib fractures.  No fevers or chills.  no acute issues overnight.  Patient denies dizziness, shortness of breath, abdominal pain, N/V/D/C, new weakness, numbess, tingling.   Objective:   Blood pressure 140/96, pulse 69, temperature 97.8 F (36.6 C), temperature source Oral, resp. rate 20, height 6\' 1"  (1.854 m), weight 100.6 kg (221 lb 12.5 oz), SpO2 91  %.  Wt Readings from Last 3 Encounters:  01/22/15 100.6 kg (221 lb 12.5 oz)  10/30/14 100.699 kg (222 lb)  04/08/14 100.88 kg (222 lb 6.4 oz)     Intake/Output Summary (Last 24 hours) at 01/23/15 1134 Last data filed at 01/23/15 0720  Gross per 24 hour  Intake 2256.67 ml  Output   2000 ml  Net 256.67 ml    Exam  General: Alert and oriented x 3, NAD  HEENT:  PERRLA, EOMI, Anicteric Sclera, mucous membranes moist.   Neck: Supple, no JVD, no masses  CVS: S1 S2 clear, no mrg  Respiratory: right chest wall tenderness, RRR, S1S2 clear  Abdomen: Soft, NT, ND, NBS  Ext: no cyanosis clubbing or edema  Neuro: no new deficits  Skin: No rashes  Psych: Normal affect and demeanor, alert and oriented x3    Data Review   Micro Results Recent Results (from the past 240 hour(s))  Urine culture     Status: None   Collection Time: 01/20/15  5:48 PM  Result Value Ref Range Status   Specimen Description URINE, CLEAN CATCH  Final   Special Requests Normal  Final   Culture MULTIPLE SPECIES PRESENT, SUGGEST RECOLLECTION  Final   Report Status 01/21/2015 FINAL  Final    Radiology Reports Ct Abdomen Pelvis Wo Contrast  01/22/2015   CLINICAL DATA:  Hydronephrosis without pain. Recent fall with right rib fractures  EXAM: CT ABDOMEN AND PELVIS WITHOUT CONTRAST  TECHNIQUE: Multidetector CT imaging of the abdomen and pelvis was performed following the standard protocol without IV contrast.  COMPARISON:  Ultrasound 01/22/2015, CT 03/03/2006  FINDINGS: Small pleural effusions right greater than left. Adjacent consolidation/ atelectasis posteriorly in the lower lobes. Partially calcified pleural plaques are noted posteriorly. Minimally displaced fractures of the lateral aspect right ribs 7-10. No pneumothorax.  Scattered coronary calcifications.  Multiple low-attenuation well demarcated liver lesions. Some were seen on the prior 2007 scan, suggesting a benign etiology such as cysts.  Moderate  inflammatory/edematous changes around the left kidney and renal collecting system. There is mild left hydronephrosis and ureterectasis down to the level of a 7 mm calculus at the level of the L4-5 interspace.  Hyperdense 18 mm left lower pole renal lesion. A few prominent left para-aortic lymph nodes measuring up to 9 mm short axis diameter.  Multiple right renal lesions, most low-attenuation and present previously consistent with cysts. There is a partially exophytic 2.9 cm soft tissue attenuation mass from the interpolar region. Right nephrolithiasis without hydronephrosis.  Diffuse pancreatic parenchymal atrophy. Adrenal glands unremarkable.  Patchy aortoiliac arterial calcifications. 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  Moderate hiatal hernia. Small bowel and colon are nondilated. Urinary bladder decompressed by Foley catheter. Right hip arthroplasty hardware results in streak artifact degrading portions of the scan. No definite ascites. Prostatic enlargement with central coarse calcifications. No free air. Advanced spondylitic changes throughout the lumbar spine.  IMPRESSION: 1. Obstructing 7 mm mid left ureteral calculus. 2. Bilateral renal lesions as above. The exophytic right interpolar and hyperdense left lower pole lesions have progressed since prior study. Further evaluation suggested to exclude neoplasm. 3. Additional chronic and ancillary findings as above.   Electronically Signed   By: Lucrezia Europe M.D.   On: 01/22/2015 19:08   Dg Ribs Unilateral W/chest Right  01/21/2015   CLINICAL DATA:  Pain.  Fall.  Initial evaluation.  EXAM: RIGHT RIBS AND CHEST - 3+ VIEW  COMPARISON:  02/25/2013.  FINDINGS: Cardiomegaly low lung volumes with basilar atelectasis. Right posterior lateral seventh, eighth, ninth, and tenth rib fractures are noted. These are angulated and displaced. No pneumothorax.  IMPRESSION: Right posterior lateral seventh, eighth, ninth, and tenth angulated and displaced rib  fractures. No pneumothorax.   Electronically Signed   By: Marcello Moores  Register   On: 01/21/2015 13:54   US Renal  01/22/2015   CLINICAL DATA:  Acute renal failure. History hypertension, chronic renal and impairment.  EXAM: RENAL / URINARY TRACT ULTRASOUND COMPLETE  COMPARISON:  08/15/2007  FINDINGS: Right Kidney:  Length: 12.7 cm. Upper pole cyst is 9.6 x 10.2 x 8.7 cm. Midpole cyst is 2.4 x 2.3 x 2.1 cm. Lower pole cyst is 1.9 x 1.3 x 1.8 cm. No hydronephrosis.  Left Kidney:  Length: 12.8 cm. Mild hydronephrosis is present. Small cysts are identified in the midpole region, 2.4 x 1.7 x 2.4 cm, in the lower pole region measuring 2.0 x 2.0 x 2.3 cm and 1.6 x 1.6 x 1.5 cm. No solid mass.  Bladder:  Appears normal for degree of bladder distention.  IMPRESSION: 1. Bilateral renal cysts. 2. Mild left hydronephrosis. Consider further evaluation is CT of the abdomen and pelvis.   Electronically Signed   By: Nolon Nations M.D.   On: 01/22/2015 09:15   Dg Knee Complete 4 Views Right  01/20/2015   CLINICAL DATA:  Fall.  Pain.  Initial evaluation.  EXAM: RIGHT KNEE - COMPLETE 4+ VIEW  COMPARISON:  None.  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 Hardware intact. No acute bony abnormality. Soft tissue ossification most likely postsurgical . Loose bodies within the joint space. Peripheral vascular calcification.  IMPRESSION: No acute abnormality. 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 Hardware intact.   Electronically Signed   By: Marcello Moores  Register   On: 01/20/2015 15:58    CBC  Recent Labs Lab 01/20/15 1604 01/21/15 0645  WBC 12.6* 9.5  HGB 13.9 12.5*  HCT 42.2 38.2*  PLT 208 188  MCV 96.6 96.2  MCH 31.8 31.5  MCHC 32.9 32.7  RDW 15.4 15.3  LYMPHSABS 0.4*  --   MONOABS 1.3*  --   EOSABS 0.0  --   BASOSABS 0.0  --     Chemistries   Recent Labs Lab 01/20/15 1604 01/21/15 0645 01/22/15 0545 01/23/15 0500  NA 139 138 140 138  K 3.9 3.3* 3.9 3.9  CL  106 107 110 108  CO2 21* 21* 19* 20*  GLUCOSE 100* 98 95 91  BUN 50* 43* 41* 34*  CREATININE 3.37* 2.91* 2.60* 2.15*  CALCIUM 8.5* 7.8* 7.9* 7.7*  AST 40 20  --   --  ALT 33 24  --   --   ALKPHOS 77 62  --   --   BILITOT 0.7 0.9  --   --    ------------------------------------------------------------------------------------------------------------------ estimated creatinine clearance is 36.5 mL/min (by C-G formula based on Cr of 2.15). ------------------------------------------------------------------------------------------------------------------  Recent Labs  01/20/15 1604  HGBA1C 5.4   ------------------------------------------------------------------------------------------------------------------  Recent Labs  01/21/15 0645  CHOL 103  HDL 27*  LDLCALC 58  TRIG 92  CHOLHDL 3.8   ------------------------------------------------------------------------------------------------------------------ No results for input(s): TSH, T4TOTAL, T3FREE, THYROIDAB in the last 72 hours.  Invalid input(s): FREET3 ------------------------------------------------------------------------------------------------------------------ No results for input(s): VITAMINB12, FOLATE, FERRITIN, TIBC, IRON, RETICCTPCT in the last 72 hours.  Coagulation profile  Recent Labs Lab 01/20/15 1604 01/21/15 0645 01/22/15 1125 01/22/15 1136 01/23/15 0500  INR 4.87* 4.81* 3.73* 3.64* 3.22*    No results for input(s): DDIMER in the last 72 hours.  Cardiac Enzymes No results for input(s): CKMB, TROPONINI, MYOGLOBIN in the last 168 hours.  Invalid input(s): CK ------------------------------------------------------------------------------------------------------------------ Invalid input(s): [place]  01/21/15 0744 01/22/15 0745 01/22/15 1131 01/23/15 0802  GLUCAP 96 100* 138* 76     Tarquin Welcher M.D. [facility] 01/23/2015, 11:34 AM  Pager: [phone] Between 7am to  7pm - call Pager - [phone]  After 7pm go to [email or web address] - password [email or web address]  Call night coverage person covering after 7pm

## 2015-01-23 NOTE — Progress Notes (Signed)
Subjective: Patient reports improvement in right flank pain. He denies left flank pain. CT obtained last night showed 47mm left UPJ stone with mild hydronephrosis. Creatinine improved to 2.1 with foley catheter  Objective: Vital signs in last 24 hours: Temp:  [97.8 F (36.6 C)-99.3 F (37.4 C)] 97.8 F (36.6 C) (10/07 0437) Pulse Rate:  [69-89] 69 (10/07 0437) Resp:  [18-20] 20 (10/07 0437) BP: (140-171)/(96-98) 140/96 mmHg (10/07 0437) SpO2:  [91 %-94 %] 91 % (10/07 0437) Weight:  [100.6 kg (221 lb 12.5 oz)] 100.6 kg (221 lb 12.5 oz) (10/06 2007)  Intake/Output from previous day: 10/06 0701 - 10/07 0700 In: 2890.8 [P.O.:480; I.V.:2410.8] Out: 2175 [Urine:2175] Intake/Output this shift: Total I/O In: -  Out: 300 [Urine:300]  Physical Exam:  General:alert, cooperative and appears stated age GI: soft, non tender, normal bowel sounds, no palpable masses, no organomegaly, no inguinal hernia Male genitalia: no penile lesions or discharge no testicular masses no bladder distension noted Resp: clear to auscultation bilaterally Extremities: extremities normal, atraumatic, no cyanosis or edema  Lab Results:  Recent Labs  01/20/15 1604 01/21/15 0645  HGB 13.9 12.5*  HCT 42.2 38.2*   BMET  Recent Labs  01/22/15 0545 01/23/15 0500  NA 140 138  K 3.9 3.9  CL 110 108  CO2 19* 20*  GLUCOSE 95 91  BUN 41* 34*  CREATININE 2.60* 2.15*  CALCIUM 7.9* 7.7*    Recent Labs  01/22/15 1125 01/22/15 1136 01/23/15 0500  INR 3.73* 3.64* 3.22*   No results for input(s): LABURIN in the last 72 hours. Results for orders placed or performed during the hospital encounter of 01/20/15  Urine culture     Status: None   Collection Time: 01/20/15  5:48 PM  Result Value Ref Range Status   Specimen Description URINE, CLEAN CATCH  Final   Special Requests Normal  Final   Culture MULTIPLE SPECIES PRESENT, SUGGEST RECOLLECTION  Final   Report Status 01/21/2015 FINAL  Final     Studies/Results: Ct Abdomen Pelvis Wo Contrast  01/22/2015   CLINICAL DATA:  Hydronephrosis without pain. Recent fall with right rib fractures  EXAM: CT ABDOMEN AND PELVIS WITHOUT CONTRAST  TECHNIQUE: Multidetector CT imaging of the abdomen and pelvis was performed following the standard protocol without IV contrast.  COMPARISON:  Ultrasound 01/22/2015, CT 03/03/2006  FINDINGS: Small pleural effusions right greater than left. Adjacent consolidation/ atelectasis posteriorly in the lower lobes. Partially calcified pleural plaques are noted posteriorly. Minimally displaced fractures of the lateral aspect right ribs 7-10. No pneumothorax.  Scattered coronary calcifications.  Multiple low-attenuation well demarcated liver lesions. Some were seen on the prior 2007 scan, suggesting a benign etiology such as cysts.  Moderate inflammatory/edematous changes around the left kidney and renal collecting system. There is mild left hydronephrosis and ureterectasis down to the level of a 7 mm calculus at the level of the L4-5 interspace.  Hyperdense 18 mm left lower pole renal lesion. A few prominent left para-aortic lymph nodes measuring up to 9 mm short axis diameter.  Multiple right renal lesions, most low-attenuation and present previously consistent with cysts. There is a partially exophytic 2.9 cm soft tissue attenuation mass from the interpolar region. Right nephrolithiasis without hydronephrosis.  Diffuse pancreatic parenchymal atrophy. Adrenal glands unremarkable.  Patchy aortoiliac arterial calcifications. 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  Moderate hiatal hernia. Small bowel and colon are nondilated. Urinary bladder decompressed by Foley catheter. Right hip arthroplasty hardware results in streak artifact degrading portions of  the scan. No definite ascites. Prostatic enlargement with central coarse calcifications. No free air. Advanced spondylitic changes throughout the lumbar spine.  IMPRESSION:  1. Obstructing 7 mm mid left ureteral calculus. 2. Bilateral renal lesions as above. The exophytic right interpolar and hyperdense left lower pole lesions have progressed since prior study. Further evaluation suggested to exclude neoplasm. 3. Additional chronic and ancillary findings as above.   Electronically Signed   By: Lucrezia Europe M.D.   On: 01/22/2015 19:08   Dg Ribs Unilateral W/chest Right  01/21/2015   CLINICAL DATA:  Pain.  Fall.  Initial evaluation.  EXAM: RIGHT RIBS AND CHEST - 3+ VIEW  COMPARISON:  02/25/2013.  FINDINGS: Cardiomegaly low lung volumes with basilar atelectasis. Right posterior lateral seventh, eighth, ninth, and tenth rib fractures are noted. These are angulated and displaced. No pneumothorax.  IMPRESSION: Right posterior lateral seventh, eighth, ninth, and tenth angulated and displaced rib fractures. No pneumothorax.   Electronically Signed   By: Marcello Moores  Register   On: 01/21/2015 13:54   US Renal  01/22/2015   CLINICAL DATA:  Acute renal failure. History hypertension, chronic renal and impairment.  EXAM: RENAL / URINARY TRACT ULTRASOUND COMPLETE  COMPARISON:  08/15/2007  FINDINGS: Right Kidney:  Length: 12.7 cm. Upper pole cyst is 9.6 x 10.2 x 8.7 cm. Midpole cyst is 2.4 x 2.3 x 2.1 cm. Lower pole cyst is 1.9 x 1.3 x 1.8 cm. No hydronephrosis.  Left Kidney:  Length: 12.8 cm. Mild hydronephrosis is present. Small cysts are identified in the midpole region, 2.4 x 1.7 x 2.4 cm, in the lower pole region measuring 2.0 x 2.0 x 2.3 cm and 1.6 x 1.6 x 1.5 cm. No solid mass.  Bladder:  Appears normal for degree of bladder distention.  IMPRESSION: 1. Bilateral renal cysts. 2. Mild left hydronephrosis. Consider further evaluation is CT of the abdomen and pelvis.   Electronically Signed   By: Nolon Nations M.D.   On: 01/22/2015 09:15    Assessment/Plan: 68DU with BPH with LUTS, urinary retention, ARF, ureteral calculi  1. Continue foley catheter to straight drain 2. Flomax 0.4mg   daily 3. Continue to trend creatinine. ARF likely related to dehydration and urinary retention. It is unlikely to have resulted from the ureteral calculus. We discussed treatment of the stone versus medical expulsive therapy and the patient desires medical expulsive therapy at this time 4. Urology to continue to follow   LOS: 3 days   Sibel Khurana L 01/23/2015, 11:38 AM

## 2015-01-23 NOTE — Care Management Important Message (Signed)
Important Message  Patient Details  Name: Nathan [name] MRN: [id number] Date of Birth: Dec 26, 1940   Medicare Important Message Given:  Yes-second notification given    Delorse Lek 01/23/2015, 11:23 AM

## 2015-01-23 NOTE — Progress Notes (Signed)
ANTICOAGULATION CONSULT NOTE - Follow Up Consult  Pharmacy Consult for Warfarin Indication: atrial fibrillation  Allergies  Allergen Reactions  . Tramadol Nausea And Vomiting    Patient Measurements: Height: 6\' 1"  (185.4 cm) Weight: 221 lb 12.5 oz (100.6 kg) IBW/kg (Calculated) : 79.9  Vital Signs: Temp: 97.6 F (36.4 C) (10/07 1000) Temp Source: Oral (10/07 1000) BP: 143/97 mmHg (10/07 1000) Pulse Rate: 81 (10/07 1000)  Labs:  Recent Labs  01/20/15 1604 01/21/15 0645 01/22/15 0545 01/22/15 1125 01/22/15 1136 01/23/15 0500  HGB 13.9 12.5*  --   --   --   --   HCT 42.2 38.2*  --   --   --   --   PLT 208 188  --   --   --   --   LABPROT 44.0* 43.7*  --  36.1* 35.4* 32.3*  INR 4.87* 4.81*  --  3.73* 3.64* 3.22*  CREATININE 3.37* 2.91* 2.60*  --   --  2.15*    Estimated Creatinine Clearance: 36.5 mL/min (by C-G formula based on Cr of 2.15).   Assessment: 28 YOM admitted on 10/4 after a fall with several broken ribs. The patient was on warfarin PTA for hx Afib and presented with a SUPRAtherapeutic INR of 4.87. No reversal agents have been given. Imaging also revealed an obstructing 7 mm L-ureteral calculus - urology on board.   INR today remains SUPRAtherapeutic (3.22 << 3.64, goal of 2-3), No CBC today - no overt s/sx of bleeding noted. Plans are to continue to hold warfarin for now while awaiting urology plans for treatment of ureteral calculus.   Goal of Therapy:  INR 2-3   Plan:  1. Hold warfarin today 2. Will continue to monitor for any signs/symptoms of bleeding and will follow up with PT/INR in the a.m.   Alycia Rossetti, PharmD, BCPS Clinical Pharmacist Pager: [phone] 01/23/2015 1:38 PM

## 2015-01-23 NOTE — Progress Notes (Signed)
Physical Therapy Treatment Patient Details Name: Nathan [name] MRN: [id number] DOB: Oct 04, 1940 Today's Date: 01/23/2015    History of Present Illness Nathan [name] is a 74 y.o. male with history of atrial fibrillation HTN chronic anticoagulation diverticulosis CKD presented to the ED with falling episodes. Apparently the patient fell while trying to get out of the car. Patient sustained a knee injury as a result. Patient has also been having generalized weakness. Patient was noted in the ED to have an elevated creatinine and is being admitted for further assessment. Patient had no chest pain and had no syncope.  01/23/15 xray shows Rt rib fractures.    PT Comments    Patient making progress with mobility and gait today.  Continues to require +2 assist for gait.  Limited today by pain/fatigue.  Agree with need for SNF at discharge for continued therapy.  Follow Up Recommendations  SNF     Equipment Recommendations  Rolling walker with 5" wheels;3in1 (PT)    Recommendations for Other Services       Precautions / Restrictions Precautions Precautions: Fall Precaution Comments: Rt rib fractures Restrictions Weight Bearing Restrictions: No    Mobility  Bed Mobility Overal bed mobility: Needs Assistance Bed Mobility: Supine to Sit     Supine to sit: Min assist     General bed mobility comments: Heavy use of bed rails to pull self up to sitting.  Assist for balance and to keep trunk upright during transition.  Increased time to move to sitting and to scoot to EOB  Transfers Overall transfer level: Needs assistance Equipment used: Rolling walker (2 wheeled) Transfers: Sit to/from Stand Sit to Stand: Mod assist;+2 safety/equipment         General transfer comment: Verbal cues for hand placement.  Assist to rise to standing.  Knees and hips remained flexed in stance.  Assist for balance in stance.  Cues for hand placement and assist to control descent when sitting into  chair.  Ambulation/Gait Ambulation/Gait assistance: Min assist;+2 safety/equipment Ambulation Distance (Feet): 40 Feet Assistive device: Rolling walker (2 wheeled) Gait Pattern/deviations: Step-through pattern;Decreased step length - right;Decreased step length - left;Decreased stride length;Shuffle;Trunk flexed (Knees and hips remained flexed) Gait velocity: decreased Gait velocity interpretation: Below normal speed for age/gender General Gait Details: Verbal cues for safe use of RW and to stand upright during gait.  Patient with flexed posture and shuffle gait.  Required assist to maintain balance, especially during turns.  Increased pain in ribs when turning RW.  HR 87 at end of walk.   Stairs            Wheelchair Mobility    Modified Rankin (Stroke Patients Only)       Balance Overall balance assessment: Needs assistance Sitting-balance support: Bilateral upper extremity supported;Feet supported Sitting balance-Leahy Scale: Poor   Postural control: Posterior lean Standing balance support: Bilateral upper extremity supported Standing balance-Leahy Scale: Poor                      Cognition Arousal/Alertness: Awake/alert Behavior During Therapy: WFL for tasks assessed/performed Overall Cognitive Status: Within Functional Limits for tasks assessed                      Exercises      General Comments        Pertinent Vitals/Pain Pain Assessment: 0-10 Pain Score: 5  Pain Location: Rt rib cage with quick movements Pain Descriptors / Indicators: Sharp Pain Intervention(s):  Monitored during session;Repositioned    Home Living                      Prior Function            PT Goals (current goals can now be found in the care plan section) Progress towards PT goals: Progressing toward goals    Frequency  Min 3X/week    PT Plan Current plan remains appropriate    Co-evaluation             End of Session Equipment  Utilized During Treatment: Gait belt Activity Tolerance: Patient limited by pain;Patient limited by fatigue Patient left: in chair;with call bell/phone within reach;with family/visitor present     Time: 0258-5277 PT Time Calculation (min) (ACUTE ONLY): 25 min  Charges:  $Gait Training: 23-37 mins                    G Codes:      Nathan [name] 02/03/15, 4:02 PM Nathan [name], [place] Pager [phone]

## 2015-01-24 LAB — PROTIME-INR
INR: 2.66 — ABNORMAL HIGH (ref 0.00–1.49)
Prothrombin Time: 28 seconds — ABNORMAL HIGH (ref 11.6–15.2)

## 2015-01-24 LAB — BASIC METABOLIC PANEL
Anion gap: 11 (ref 5–15)
BUN: 37 mg/dL — ABNORMAL HIGH (ref 6–20)
CO2: 20 mmol/L — ABNORMAL LOW (ref 22–32)
Calcium: 8.1 mg/dL — ABNORMAL LOW (ref 8.9–10.3)
Chloride: 110 mmol/L (ref 101–111)
Creatinine, Ser: 1.95 mg/dL — ABNORMAL HIGH (ref 0.61–1.24)
GFR calc Af Amer: 37 mL/min — ABNORMAL LOW (ref >60)
GFR calc non Af Amer: 32 mL/min — ABNORMAL LOW (ref >60)
Glucose, Bld: 92 mg/dL (ref 65–99)
Potassium: 4.2 mmol/L (ref 3.5–5.1)
Sodium: 141 mmol/L (ref 135–145)

## 2015-01-24 LAB — GLUCOSE, CAPILLARY: Glucose-Capillary: 88 mg/dL (ref 65–99)

## 2015-01-24 MED ORDER — ISOSORBIDE MONONITRATE ER 30 MG PO TB24
15.0000 mg | ORAL_TABLET | Freq: Every day | ORAL | Status: DC
  Administered 2015-01-24: 15 mg via ORAL
  Filled 2015-01-24: qty 1

## 2015-01-24 MED ORDER — WARFARIN SODIUM 5 MG PO TABS
5.0000 mg | ORAL_TABLET | Freq: Once | ORAL | Status: AC
  Administered 2015-01-24: 5 mg via ORAL
  Filled 2015-01-24: qty 1

## 2015-01-24 MED ORDER — WARFARIN - PHARMACIST DOSING INPATIENT: Freq: Every day | Status: DC

## 2015-01-24 NOTE — Progress Notes (Signed)
Subjective: No acute events overnight Patient states that his right side is persistently sore, slightly better. Denies any left-sided tenderness. No abdominal tenderness. No nausea/vomiting. No fevers or chills. Tolerating the catheter well. Objective: Vital signs in last 24 hours: Temp:  [97.6 F (36.4 C)-98.9 F (37.2 C)] 98.9 F (37.2 C) (10/08 0443) Pulse Rate:  [69-93] 84 (10/08 0443) Resp:  [16-18] 18 (10/08 0443) BP: (123-160)/(89-100) 160/92 mmHg (10/08 0443) SpO2:  [94 %-99 %] 96 % (10/08 0443) Weight:  [105.235 kg (232 lb)] 105.235 kg (232 lb) (10/07 2024)  Intake/Output from previous day: 10/07 0701 - 10/08 0700 In: 1164 [P.O.:1164] Out: 2300 [Urine:2300] Intake/Output this shift: Total I/O In: 444 [P.O.:444] Out: 1100 [Urine:1100]  Physical Exam:  General:alert, cooperative and appears stated age GI: soft, non tender, normal bowel sounds, no palpable masses, no organomegaly, no inguinal hernia Male genitalia: no penile lesions or discharge no testicular masses no bladder distension noted Resp: clear to auscultation bilaterally Extremities: extremities normal, atraumatic, no cyanosis or edema  Lab Results: No results for input(s): HGB, HCT in the last 72 hours. BMET  Recent Labs  01/23/15 0500 01/24/15 0535  NA 138 141  K 3.9 4.2  CL 108 110  CO2 20* 20*  GLUCOSE 91 92  BUN 34* 37*  CREATININE 2.15* 1.95*  CALCIUM 7.7* 8.1*    Recent Labs  01/22/15 1136 01/23/15 0500 01/24/15 0535  INR 3.64* 3.22* 2.66*   No results for input(s): LABURIN in the last 72 hours. Results for orders placed or performed during the hospital encounter of 01/20/15  Urine culture     Status: None   Collection Time: 01/20/15  5:48 PM  Result Value Ref Range Status   Specimen Description URINE, CLEAN CATCH  Final   Special Requests Normal  Final   Culture MULTIPLE SPECIES PRESENT, SUGGEST RECOLLECTION  Final   Report Status 01/21/2015 FINAL  Final     Studies/Results: Ct Abdomen Pelvis Wo Contrast  01/22/2015   CLINICAL DATA:  Hydronephrosis without pain. Recent fall with right rib fractures  EXAM: CT ABDOMEN AND PELVIS WITHOUT CONTRAST  TECHNIQUE: Multidetector CT imaging of the abdomen and pelvis was performed following the standard protocol without IV contrast.  COMPARISON:  Ultrasound 01/22/2015, CT 03/03/2006  FINDINGS: Small pleural effusions right greater than left. Adjacent consolidation/ atelectasis posteriorly in the lower lobes. Partially calcified pleural plaques are noted posteriorly. Minimally displaced fractures of the lateral aspect right ribs 7-10. No pneumothorax.  Scattered coronary calcifications.  Multiple low-attenuation well demarcated liver lesions. Some were seen on the prior 2007 scan, suggesting a benign etiology such as cysts.  Moderate inflammatory/edematous changes around the left kidney and renal collecting system. There is mild left hydronephrosis and ureterectasis down to the level of a 7 mm calculus at the level of the L4-5 interspace.  Hyperdense 18 mm left lower pole renal lesion. A few prominent left para-aortic lymph nodes measuring up to 9 mm short axis diameter.  Multiple right renal lesions, most low-attenuation and present previously consistent with cysts. There is a partially exophytic 2.9 cm soft tissue attenuation mass from the interpolar region. Right nephrolithiasis without hydronephrosis.  Diffuse pancreatic parenchymal atrophy. Adrenal glands unremarkable.  Patchy aortoiliac arterial calcifications. 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  Moderate hiatal hernia. Small bowel and colon are nondilated. Urinary bladder decompressed by Foley catheter. Right hip arthroplasty hardware results in streak artifact degrading portions of the scan. No definite ascites. Prostatic enlargement with central coarse calcifications. No  free air. Advanced spondylitic changes throughout the lumbar spine.  IMPRESSION:  1. Obstructing 7 mm mid left ureteral calculus. 2. Bilateral renal lesions as above. The exophytic right interpolar and hyperdense left lower pole lesions have progressed since prior study. Further evaluation suggested to exclude neoplasm. 3. Additional chronic and ancillary findings as above.   Electronically Signed   By: Lucrezia Europe M.D.   On: 01/22/2015 19:08   US Renal  01/22/2015   CLINICAL DATA:  Acute renal failure. History hypertension, chronic renal and impairment.  EXAM: RENAL / URINARY TRACT ULTRASOUND COMPLETE  COMPARISON:  08/15/2007  FINDINGS: Right Kidney:  Length: 12.7 cm. Upper pole cyst is 9.6 x 10.2 x 8.7 cm. Midpole cyst is 2.4 x 2.3 x 2.1 cm. Lower pole cyst is 1.9 x 1.3 x 1.8 cm. No hydronephrosis.  Left Kidney:  Length: 12.8 cm. Mild hydronephrosis is present. Small cysts are identified in the midpole region, 2.4 x 1.7 x 2.4 cm, in the lower pole region measuring 2.0 x 2.0 x 2.3 cm and 1.6 x 1.6 x 1.5 cm. No solid mass.  Bladder:  Appears normal for degree of bladder distention.  IMPRESSION: 1. Bilateral renal cysts. 2. Mild left hydronephrosis. Consider further evaluation is CT of the abdomen and pelvis.   Electronically Signed   By: Nolon Nations M.D.   On: 01/22/2015 09:15    Assessment/Plan: 27NL with BPH with LUTS, urinary retention, ARF, ureteral calculi Patient's labs are not back today, however given that he has no significant pain or fevers and his creatinine is improving we can continue to manage this conservatively with medical expulsion therapy. Would continue with the Foley catheter, we can set up a voiding trial in her office in the middle of next week. We will continue to follow along.    LOS: 4 days   Ardis Hughs 01/24/2015, 6:47 AM

## 2015-01-24 NOTE — Progress Notes (Signed)
[facility]                                                                              Patient Demographics  Nathan [name], is a 74 y.o. male, DOB - 02-20-1941, [place] date - 01/20/2015   Admitting Physician Allyne [name], MD  Outpatient Primary MD for the patient is [name], MD  LOS - 4   Chief Complaint  Patient presents with  . Fall  . Knee Pain  . Weakness       Brief HPI   Nathan [name] is a 74 y.o. male with history of atrial fibrillation, HTN, chronic anticoagulation, diverticulosis, CKD presented to the ED with falling episodes. Apparently the patient fell while trying to get out of the car. Patient sustained a knee injury as a result. Patient also complained about having generalized weakness. Patient was noted in the ED to have an elevated creatinine and was admitted for further assessment. Patient did report having right-sided chest wall pain after the fall and had no syncope. Patient had no nausea or vomiting noted. He has been on ACE inhibitor and also has been on diuretics.    Assessment & Plan    Principal Problem:   Acute renal failure superimposed on stage 3 chronic kidney disease ([place]): Presenting with creatinine of 3.37, baseline 1.4 - Likely secondary to postobstructive nephropathy - Renal ultrasound showed left-sided hydronephrosis - CT of the abdomen and pelvis showed obstructing 7 mmHg left ureteral calculus, bilateral renal lesions - He was seen and evaluated by Dr. Alyson Ingles of urology -His kidney function has improved with IV fluid resuscitation, a.m. lab work showing ongoing downward trend in his creatinine to 1.95. This is come down from 2.9 from lab work drawn on 01/21/2015. Will continue running maintenance fluids follow-up on a.m. lab work.  Active Problems:   Essential hypertension - Continue Cardizem 360 mg by mouth daily and metoprolol 100 mg by mouth twice a day, will add Imdur 15 mg by mouth daily  since his ACE inhibitor therapy was discontinued and blood pressures remain elevated -Lisinopril was discontinued due to acute renal failure    Atrial fibrillation (HCC) on chronic anticoagulation - Currently stable, continue Cardizem, metoprolol, - INR therapeutic at 2.66, pharmacy consulted for warfarin management    Fall with multiple rib fractures - Rib series showed right 7,8, 9, 10th rib fractures  - Continue pain control - Incentive spirometry - rightKnee x-ray negative for any dislocation or fracture    Hypokalemia - Resolved  Code Status: Full code   Family Communication: Discussed in detail with the patient, all imaging results, lab results explained to the patient    Disposition Plan: Not medically ready , will need skilled nursing facility, he is agreeable  Time Spent in minutes 25 minutes  Procedures  Knee x-ray Renal ultrasound CT abdomen and pelvis  Consults   Urology  DVT Prophylaxis INR still 3.22, Coumadin on hold, place SCDs.   Medications  Scheduled Meds: . diltiazem  360 mg Oral Daily  . finasteride  5 mg Oral Daily  . metoprolol  100 mg Oral BID  .  multivitamin with minerals  1 tablet Oral Daily  . sodium chloride  3 mL Intravenous Q12H  . Tdap  0.5 mL Intramuscular Once  . warfarin  5 mg Oral ONCE-1800  . Warfarin - Pharmacist Dosing Inpatient   Does not apply q1800   Continuous Infusions: . sodium chloride 100 mL/hr at 01/24/15 1006   PRN Meds:.acetaminophen **OR** acetaminophen, hydrALAZINE, ondansetron **OR** ondansetron (ZOFRAN) IV, oxyCODONE   Antibiotics   Anti-infectives    Start     Dose/Rate Route Frequency Ordered Stop   01/21/15 0900  cefTRIAXone (ROCEPHIN) 1 g in dextrose 5 % 50 mL IVPB  Status:  Discontinued     1 g 100 mL/hr over 30 Minutes Intravenous Every 24 hours 01/21/15 0752 01/22/15 1123        Subjective:   Nathan [name] was seen and examined today. He is stable, in no acute distress. Patient reporting  ongoing pain involving right chest wall region.   Objective:   Blood pressure 147/96, pulse 60, temperature 98 F (36.7 C), temperature source Oral, resp. rate 20, height 6\' 1"  (1.854 m), weight 105.235 kg (232 lb), SpO2 97 %.  Wt Readings from Last 3 Encounters:  01/23/15 105.235 kg (232 lb)  10/30/14 100.699 kg (222 lb)  04/08/14 100.88 kg (222 lb 6.4 oz)     Intake/Output Summary (Last 24 hours) at 01/24/15 1452 Last data filed at 01/24/15 1417  Gross per 24 hour  Intake   1964 ml  Output   2100 ml  Net   -136 ml    Exam  General: Alert and oriented x 3, NAD  HEENT:  PERRLA, EOMI, Anicteric Sclera, mucous membranes moist.   Neck: Supple, no JVD, no masses  CVS: S1 S2 clear, no mrg  Respiratory: right chest wall tenderness, RRR, S1S2 clear  Abdomen: Soft, NT, ND, NBS  Ext: no cyanosis clubbing or edema  Neuro: no new deficits  Skin: No rashes  Psych: Normal affect and demeanor, alert and oriented x3    Data Review   Micro Results Recent Results (from the past 240 hour(s))  Urine culture     Status: None   Collection Time: 01/20/15  5:48 PM  Result Value Ref Range Status   Specimen Description URINE, CLEAN CATCH  Final   Special Requests Normal  Final   Culture MULTIPLE SPECIES PRESENT, SUGGEST RECOLLECTION  Final   Report Status 01/21/2015 FINAL  Final    Radiology Reports Ct Abdomen Pelvis Wo Contrast  01/22/2015   CLINICAL DATA:  Hydronephrosis without pain. Recent fall with right rib fractures  EXAM: CT ABDOMEN AND PELVIS WITHOUT CONTRAST  TECHNIQUE: Multidetector CT imaging of the abdomen and pelvis was performed following the standard protocol without IV contrast.  COMPARISON:  Ultrasound 01/22/2015, CT 03/03/2006  FINDINGS: Small pleural effusions right greater than left. Adjacent consolidation/ atelectasis posteriorly in the lower lobes. Partially calcified pleural plaques are noted posteriorly. Minimally displaced fractures of the lateral aspect  right ribs 7-10. No pneumothorax.  Scattered coronary calcifications.  Multiple low-attenuation well demarcated liver lesions. Some were seen on the prior 2007 scan, suggesting a benign etiology such as cysts.  Moderate inflammatory/edematous changes around the left kidney and renal collecting system. There is mild left hydronephrosis and ureterectasis down to the level of a 7 mm calculus at the level of the L4-5 interspace.  Hyperdense 18 mm left lower pole renal lesion. A few prominent left para-aortic lymph nodes measuring up to 9 mm short axis diameter.  Multiple  right renal lesions, most low-attenuation and present previously consistent with cysts. There is a partially exophytic 2.9 cm soft tissue attenuation mass from the interpolar region. Right nephrolithiasis without hydronephrosis.  Diffuse pancreatic parenchymal atrophy. Adrenal glands unremarkable.  Patchy aortoiliac arterial calcifications. 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  Moderate hiatal hernia. Small bowel and colon are nondilated. Urinary bladder decompressed by Foley catheter. Right hip arthroplasty hardware results in streak artifact degrading portions of the scan. No definite ascites. Prostatic enlargement with central coarse calcifications. No free air. Advanced spondylitic changes throughout the lumbar spine.  IMPRESSION: 1. Obstructing 7 mm mid left ureteral calculus. 2. Bilateral renal lesions as above. The exophytic right interpolar and hyperdense left lower pole lesions have progressed since prior study. Further evaluation suggested to exclude neoplasm. 3. Additional chronic and ancillary findings as above.   Electronically Signed   By: Lucrezia Europe M.D.   On: 01/22/2015 19:08   Dg Ribs Unilateral W/chest Right  01/21/2015   CLINICAL DATA:  Pain.  Fall.  Initial evaluation.  EXAM: RIGHT RIBS AND CHEST - 3+ VIEW  COMPARISON:  02/25/2013.  FINDINGS: Cardiomegaly low lung volumes with basilar atelectasis. Right posterior  lateral seventh, eighth, ninth, and tenth rib fractures are noted. These are angulated and displaced. No pneumothorax.  IMPRESSION: Right posterior lateral seventh, eighth, ninth, and tenth angulated and displaced rib fractures. No pneumothorax.   Electronically Signed   By: Marcello Moores  Register   On: 01/21/2015 13:54   US Renal  01/22/2015   CLINICAL DATA:  Acute renal failure. History hypertension, chronic renal and impairment.  EXAM: RENAL / URINARY TRACT ULTRASOUND COMPLETE  COMPARISON:  08/15/2007  FINDINGS: Right Kidney:  Length: 12.7 cm. Upper pole cyst is 9.6 x 10.2 x 8.7 cm. Midpole cyst is 2.4 x 2.3 x 2.1 cm. Lower pole cyst is 1.9 x 1.3 x 1.8 cm. No hydronephrosis.  Left Kidney:  Length: 12.8 cm. Mild hydronephrosis is present. Small cysts are identified in the midpole region, 2.4 x 1.7 x 2.4 cm, in the lower pole region measuring 2.0 x 2.0 x 2.3 cm and 1.6 x 1.6 x 1.5 cm. No solid mass.  Bladder:  Appears normal for degree of bladder distention.  IMPRESSION: 1. Bilateral renal cysts. 2. Mild left hydronephrosis. Consider further evaluation is CT of the abdomen and pelvis.   Electronically Signed   By: Nolon Nations M.D.   On: 01/22/2015 09:15   Dg Knee Complete 4 Views Right  01/20/2015   CLINICAL DATA:  Fall.  Pain.  Initial evaluation.  EXAM: RIGHT KNEE - COMPLETE 4+ VIEW  COMPARISON:  None.  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 Hardware intact. No acute bony abnormality. Soft tissue ossification most likely postsurgical . Loose bodies within the joint space. Peripheral vascular calcification.  IMPRESSION: No acute abnormality. 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 Hardware intact.   Electronically Signed   By: Marcello Moores  Register   On: 01/20/2015 15:58    CBC  Recent Labs Lab 01/20/15 1604 01/21/15 0645  WBC 12.6* 9.5  HGB 13.9 12.5*  HCT 42.2 38.2*  PLT 208 188  MCV 96.6 96.2  MCH 31.8 31.5  MCHC 32.9 32.7  RDW 15.4 15.3   LYMPHSABS 0.4*  --   MONOABS 1.3*  --   EOSABS 0.0  --   BASOSABS 0.0  --     Chemistries   Recent Labs Lab 01/20/15 1604 01/21/15 0645 01/22/15 0545 01/23/15 0500 01/24/15  0535  NA 139 138 140 138 141  K 3.9 3.3* 3.9 3.9 4.2  CL 106 107 110 108 110  CO2 21* 21* 19* 20* 20*  GLUCOSE 100* 98 95 91 92  BUN 50* 43* 41* 34* 37*  CREATININE 3.37* 2.91* 2.60* 2.15* 1.95*  CALCIUM 8.5* 7.8* 7.9* 7.7* 8.1*  AST 40 20  --   --   --   ALT 33 24  --   --   --   ALKPHOS 77 62  --   --   --   BILITOT 0.7 0.9  --   --   --    ------------------------------------------------------------------------------------------------------------------ estimated creatinine clearance is 41 mL/min (by C-G formula based on Cr of 1.95). ------------------------------------------------------------------------------------------------------------------ No results for input(s): HGBA1C in the last 72 hours. ------------------------------------------------------------------------------------------------------------------ No results for input(s): CHOL, HDL, LDLCALC, TRIG, CHOLHDL, LDLDIRECT in the last 72 hours. ------------------------------------------------------------------------------------------------------------------ No results for input(s): TSH, T4TOTAL, T3FREE, THYROIDAB in the last 72 hours.  Invalid input(s): FREET3 ------------------------------------------------------------------------------------------------------------------ No results for input(s): VITAMINB12, FOLATE, FERRITIN, TIBC, IRON, RETICCTPCT in the last 72 hours.  Coagulation profile  Recent Labs Lab 01/21/15 0645 01/22/15 1125 01/22/15 1136 01/23/15 0500 01/24/15 0535  INR 4.81* 3.73* 3.64* 3.22* 2.66*    No results for input(s): DDIMER in the last 72 hours.  Cardiac Enzymes No results for input(s): CKMB, TROPONINI, MYOGLOBIN in the last 168 hours.  Invalid input(s):  CK ------------------------------------------------------------------------------------------------------------------ Invalid input(s): [place]  01/22/15 0745 01/22/15 1131 01/23/15 0802 01/24/15 0623  GLUCAP 100* 138* 86 88     Coralyn Pear Nathan [name] M.D. [facility] 01/24/2015, 2:52 PM  Pager: [phone] Between 7am to 7pm - call Pager - [phone]  After 7pm go to [email or web address] - password [email or web address]  Call night coverage person covering after 7pm

## 2015-01-24 NOTE — Progress Notes (Signed)
ANTICOAGULATION CONSULT NOTE - Follow Up Consult  Pharmacy Consult for Warfarin Indication: atrial fibrillation  Allergies  Allergen Reactions  . Tramadol Nausea And Vomiting    Patient Measurements: Height: 6\' 1"  (185.4 cm) Weight: 232 lb (105.235 kg) IBW/kg (Calculated) : 79.9  Vital Signs: Temp: 98 F (36.7 C) (10/08 0903) Temp Source: Oral (10/08 0903) BP: 147/96 mmHg (10/08 0903) Pulse Rate: 60 (10/08 0903)  Labs:  Recent Labs  01/22/15 0545  01/22/15 1136 01/23/15 0500 01/24/15 0535  LABPROT  --   < > 35.4* 32.3* 28.0*  INR  --   < > 3.64* 3.22* 2.66*  CREATININE 2.60*  --   --  2.15* 1.95*  < > = values in this interval not displayed.  Estimated Creatinine Clearance: 41 mL/min (by C-G formula based on Cr of 1.95).   Assessment: 67 YOM admitted on 10/4 after a fall with several broken ribs. The patient was on warfarin PTA for hx Afib and presented with a SUPRAtherapeutic INR of 4.87. No reversal agents have been given. Imaging also revealed an obstructing 7 mm L-ureteral calculus - urology on board and plans for medical expulsion therapy.   INR is down to within therapeutic range at 2.66. Expect INR may trend down further as several doses were held. No overt s/sx of bleeding noted. Plans are to treat ureteral calculi conservatively with medical expulsion therapy.   PTA: 5 mg daily EXCEPT for 2.5 mg on Mon/Fri  Goal of Therapy:  INR 2-3   Plan:  - Warfarin 5 mg PO tonight - Daily INR, CBC in am - Monitor for s/sx of bleeding  [facility], Pharm.D., BCPS Clinical Pharmacist Pager: [phone] 01/24/2015 2:04 PM

## 2015-01-25 LAB — BASIC METABOLIC PANEL
Anion gap: 8 (ref 5–15)
BUN: 32 mg/dL — ABNORMAL HIGH (ref 6–20)
CO2: 21 mmol/L — ABNORMAL LOW (ref 22–32)
Calcium: 7.9 mg/dL — ABNORMAL LOW (ref 8.9–10.3)
Chloride: 109 mmol/L (ref 101–111)
Creatinine, Ser: 1.7 mg/dL — ABNORMAL HIGH (ref 0.61–1.24)
GFR calc Af Amer: 43 mL/min — ABNORMAL LOW (ref >60)
GFR calc non Af Amer: 37 mL/min — ABNORMAL LOW (ref >60)
Glucose, Bld: 83 mg/dL (ref 65–99)
Potassium: 4.3 mmol/L (ref 3.5–5.1)
Sodium: 138 mmol/L (ref 135–145)

## 2015-01-25 LAB — CBC
HCT: 37 % — ABNORMAL LOW (ref 39.0–52.0)
Hemoglobin: 11.9 g/dL — ABNORMAL LOW (ref 13.0–17.0)
MCH: 31 pg (ref 26.0–34.0)
MCHC: 32.2 g/dL (ref 30.0–36.0)
MCV: 96.4 fL (ref 78.0–100.0)
Platelets: 294 10*3/uL (ref 150–400)
RBC: 3.84 MIL/uL — ABNORMAL LOW (ref 4.22–5.81)
RDW: 15.2 % (ref 11.5–15.5)
WBC: 12.7 10*3/uL — ABNORMAL HIGH (ref 4.0–10.5)

## 2015-01-25 LAB — GLUCOSE, CAPILLARY: Glucose-Capillary: 94 mg/dL (ref 65–99)

## 2015-01-25 LAB — PROTIME-INR
INR: 2.8 — ABNORMAL HIGH (ref 0.00–1.49)
Prothrombin Time: 29.1 seconds — ABNORMAL HIGH (ref 11.6–15.2)

## 2015-01-25 MED ORDER — WARFARIN SODIUM 2.5 MG PO TABS
2.5000 mg | ORAL_TABLET | Freq: Once | ORAL | Status: AC
  Administered 2015-01-25: 2.5 mg via ORAL
  Filled 2015-01-25: qty 1

## 2015-01-25 NOTE — Progress Notes (Signed)
ANTICOAGULATION CONSULT NOTE - Follow Up Consult  Pharmacy Consult for Warfarin Indication: atrial fibrillation  Allergies  Allergen Reactions  . Tramadol Nausea And Vomiting    Patient Measurements: Height: 6\' 1"  (185.4 cm) Weight: 234 lb 9.6 oz (106.414 kg) IBW/kg (Calculated) : 79.9  Vital Signs: Temp: 98 F (36.7 C) (10/09 0921) Temp Source: Oral (10/09 0921) BP: 102/74 mmHg (10/09 0954) Pulse Rate: 68 (10/09 0954)  Labs:  Recent Labs  01/23/15 0500 01/24/15 0535 01/25/15 0711  HGB  --   --  11.9*  HCT  --   --  37.0*  PLT  --   --  294  LABPROT 32.3* 28.0* 29.1*  INR 3.22* 2.66* 2.80*  CREATININE 2.15* 1.95* 1.70*    Estimated Creatinine Clearance: 47.3 mL/min (by C-G formula based on Cr of 1.7).   Assessment: 55 YOM admitted on 10/4 after a fall with several broken ribs. The patient was on warfarin PTA for hx Afib and presented with a SUPRAtherapeutic INR of 4.87. No reversal agents have been given. Imaging also revealed an obstructing 7 mm L-ureteral calculus - urology on board and plans for medical expulsion therapy.   INR is therapeutic at 2.8. No overt s/sx of bleeding noted. Plans are to treat ureteral calculi conservatively with medical expulsion therapy.   PTA: 5 mg daily EXCEPT for 2.5 mg on Mon/Fri  Goal of Therapy:  INR 2-3   Plan:  - Warfarin 2.5 mg PO tonight - Daily INR - Monitor for s/sx of bleeding  [facility], Pharm.D., BCPS Clinical Pharmacist Pager: [phone] 01/25/2015 1:09 PM

## 2015-01-25 NOTE — Progress Notes (Signed)
  Subjective: No acute events overnight Patient states that his right side is persistently sore, slightly better. Denies any left-sided tenderness. No abdominal tenderness. No nausea/vomiting. No fevers or chills. Tolerating the catheter well.  Objective: Vital signs in last 24 hours: Temp:  [98 F (36.7 C)-99.2 F (37.3 C)] 98 F (36.7 C) (10/09 0921) Pulse Rate:  [63-95] 68 (10/09 0954) Resp:  [18-20] 18 (10/09 0921) BP: (98-162)/(54-85) 102/74 mmHg (10/09 0954) SpO2:  [96 %-98 %] 96 % (10/09 0921) Weight:  [106.414 kg (234 lb 9.6 oz)] 106.414 kg (234 lb 9.6 oz) (10/08 2043)  Intake/Output from previous day: 10/08 0701 - 10/09 0700 In: 2957.3 [P.O.:924; I.V.:2033.3] Out: 2050 [Urine:2050] Intake/Output this shift: Total I/O In: 660 [P.O.:360; I.V.:300] Out: 500 [Urine:500]  Physical Exam:  General:alert, cooperative and appears stated age GI: soft, non tender, normal bowel sounds, no palpable masses, no organomegaly, no inguinal hernia Male genitalia: no penile lesions or discharge no testicular masses no bladder distension noted Resp: clear to auscultation bilaterally Extremities: extremities normal, atraumatic, no cyanosis or edema  Lab Results:  Recent Labs  01/25/15 0711  HGB 11.9*  HCT 37.0*   BMET  Recent Labs  01/24/15 0535 01/25/15 0711  NA 141 138  K 4.2 4.3  CL 110 109  CO2 20* 21*  GLUCOSE 92 83  BUN 37* 32*  CREATININE 1.95* 1.70*  CALCIUM 8.1* 7.9*    Recent Labs  01/23/15 0500 01/24/15 0535 01/25/15 0711  INR 3.22* 2.66* 2.80*   No results for input(s): LABURIN in the last 72 hours. Results for orders placed or performed during the hospital encounter of 01/20/15  Urine culture     Status: None   Collection Time: 01/20/15  5:48 PM  Result Value Ref Range Status   Specimen Description URINE, CLEAN CATCH  Final   Special Requests Normal  Final   Culture MULTIPLE SPECIES PRESENT, SUGGEST RECOLLECTION  Final   Report Status  01/21/2015 FINAL  Final    Studies/Results: No results found.  Assessment/Plan: 74yo with BPH with LUTS, urinary retention, ARF, ureteral calculi Patient's creatinine is improving we can continue to manage this conservatively with medical expulsion therapy. Would continue with the Foley catheter, we can set up a voiding trial in her office in the middle of next week. We will continue to follow along.    LOS: 5 days   Christell Faith 01/25/2015, 1:54 PM

## 2015-01-25 NOTE — Progress Notes (Signed)
[facility]                                                                              Patient Demographics  Nathan [name], is a 74 y.o. male, DOB - [birth year], [place] date - 01/20/2015   Admitting Physician Allyne [name], MD  Outpatient Primary MD for the patient is [name], MD  LOS - 5   Chief Complaint  Patient presents with  . Fall  . Knee Pain  . Weakness       Brief HPI   Nathan [name] is a 74 y.o. male with history of atrial fibrillation, HTN, chronic anticoagulation, diverticulosis, CKD presented to the ED with falling episodes. Apparently the patient fell while trying to get out of the car. Patient sustained a knee injury as a result. Patient also complained about having generalized weakness. Patient was noted in the ED to have an elevated creatinine and was admitted for further assessment. Patient did report having right-sided chest wall pain after the fall and had no syncope. Patient had no nausea or vomiting noted. He has been on ACE inhibitor and also has been on diuretics.    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 Alyson Ingles of urology -His kidney function has improved with IV fluid resuscitation     Essential hypertension with current hypotension - Hold Cardizem 360 mg by mouth daily and metoprolol 100 mg by mouth twice a day -Lisinopril was discontinued due to acute renal failure    Atrial fibrillation (HCC) on chronic anticoagulation - rate controlled -  pharmacy consulted for warfarin management    Fall with multiple rib fractures - Rib series showed right 7,8, 9, 10th  rib fractures  - Continue pain control - Incentive spirometry - rightKnee x-ray negative for any dislocation or fracture    Hypokalemia - Resolved  Code Status: Full code   Family Communication: Discussed in detail with the patient, all imaging results, lab results explained to the patient    Disposition Plan: Not medically ready , will need skilled nursing facility, he is agreeable  Time Spent in minutes 25 minutes  Procedures  Knee x-ray Renal ultrasound CT abdomen and pelvis  Consults   Urology    Medications  Scheduled Meds: . diltiazem  360 mg Oral Daily  . finasteride  5 mg Oral Daily  . isosorbide mononitrate  15 mg Oral Daily  . metoprolol  100 mg Oral BID  . multivitamin with minerals  1 tablet Oral Daily  . sodium chloride  3 mL Intravenous Q12H  . Tdap  0.5 mL Intramuscular Once  . Warfarin - Pharmacist Dosing Inpatient   Does not apply q1800   Continuous Infusions: . sodium chloride 75 mL/hr at 01/24/15 1520   PRN Meds:.acetaminophen **OR** acetaminophen, hydrALAZINE, ondansetron **OR** ondansetron (ZOFRAN)  IV, oxyCODONE   Antibiotics   Anti-infectives    Start     Dose/Rate Route Frequency Ordered Stop   01/21/15 0900  cefTRIAXone (ROCEPHIN) 1 g in dextrose 5 % 50 mL IVPB  Status:  Discontinued     1 g 100 mL/hr over 30 Minutes Intravenous Every 24 hours 01/21/15 0752 01/22/15 1123        Subjective:   Continues to improved-- no CP , no SOB   Objective:   Blood pressure 102/74, pulse 68, temperature 98 F (36.7 C), temperature source Oral, resp. rate 18, height 6\' 1"  (1.854 m), weight 106.414 kg (234 lb 9.6 oz), SpO2 96 %.  Wt Readings from Last 3 Encounters:  01/24/15 106.414 kg (234 lb 9.6 oz)  10/30/14 100.699 kg (222 lb)  04/08/14 100.88 kg (222 lb 6.4 oz)     Intake/Output Summary (Last 24 hours) at 01/25/15 1201 Last data filed at 01/25/15 1000  Gross per 24 hour  Intake 2857.33 ml  Output   2200 ml  Net 657.33 ml     Exam  General: Alert and oriented x 3, NAD  CVS: S1 S2 clear, no mrg  Respiratory: right chest wall tenderness, RRR, S1S2 clear  Abdomen: Soft, NT, ND, NBS  Ext: no cyanosis clubbing or edema  Data Review   Micro Results Recent Results (from the past 240 hour(s))  Urine culture     Status: None   Collection Time: 01/20/15  5:48 PM  Result Value Ref Range Status   Specimen Description URINE, CLEAN CATCH  Final   Special Requests Normal  Final   Culture MULTIPLE SPECIES PRESENT, SUGGEST RECOLLECTION  Final   Report Status 01/21/2015 FINAL  Final    Radiology Reports Ct Abdomen Pelvis Wo Contrast  01/22/2015   CLINICAL DATA:  Hydronephrosis without pain. Recent fall with right rib fractures  EXAM: CT ABDOMEN AND PELVIS WITHOUT CONTRAST  TECHNIQUE: Multidetector CT imaging of the abdomen and pelvis was performed following the standard protocol without IV contrast.  COMPARISON:  Ultrasound 01/22/2015, CT 03/03/2006  FINDINGS: Small pleural effusions right greater than left. Adjacent consolidation/ atelectasis posteriorly in the lower lobes. Partially calcified pleural plaques are noted posteriorly. Minimally displaced fractures of the lateral aspect right ribs 7-10. No pneumothorax.  Scattered coronary calcifications.  Multiple low-attenuation well demarcated liver lesions. Some were seen on the prior 2007 scan, suggesting a benign etiology such as cysts.  Moderate inflammatory/edematous changes around the left kidney and renal collecting system. There is mild left hydronephrosis and ureterectasis down to the level of a 7 mm calculus at the level of the L4-5 interspace.  Hyperdense 18 mm left lower pole renal lesion. A few prominent left para-aortic lymph nodes measuring up to 9 mm short axis diameter.  Multiple right renal lesions, most low-attenuation and present previously consistent with cysts. There is a partially exophytic 2.9 cm soft tissue attenuation mass from the interpolar  region. Right nephrolithiasis without hydronephrosis.  Diffuse pancreatic parenchymal atrophy. Adrenal glands unremarkable.  Patchy aortoiliac arterial calcifications. 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  Moderate hiatal hernia. Small bowel and colon are nondilated. Urinary bladder decompressed by Foley catheter. Right hip arthroplasty hardware results in streak artifact degrading portions of the scan. No definite ascites. Prostatic enlargement with central coarse calcifications. No free air. Advanced spondylitic changes throughout the lumbar spine.  IMPRESSION: 1. Obstructing 7 mm mid left ureteral calculus. 2. Bilateral renal lesions as above. The exophytic right interpolar and hyperdense left lower  pole lesions have progressed since prior study. Further evaluation suggested to exclude neoplasm. 3. Additional chronic and ancillary findings as above.   Electronically Signed   By: Lucrezia Europe M.D.   On: 01/22/2015 19:08   Dg Ribs Unilateral W/chest Right  01/21/2015   CLINICAL DATA:  Pain.  Fall.  Initial evaluation.  EXAM: RIGHT RIBS AND CHEST - 3+ VIEW  COMPARISON:  02/25/2013.  FINDINGS: Cardiomegaly low lung volumes with basilar atelectasis. Right posterior lateral seventh, eighth, ninth, and tenth rib fractures are noted. These are angulated and displaced. No pneumothorax.  IMPRESSION: Right posterior lateral seventh, eighth, ninth, and tenth angulated and displaced rib fractures. No pneumothorax.   Electronically Signed   By: Marcello Moores  Register   On: 01/21/2015 13:54   US Renal  01/22/2015   CLINICAL DATA:  Acute renal failure. History hypertension, chronic renal and impairment.  EXAM: RENAL / URINARY TRACT ULTRASOUND COMPLETE  COMPARISON:  08/15/2007  FINDINGS: Right Kidney:  Length: 12.7 cm. Upper pole cyst is 9.6 x 10.2 x 8.7 cm. Midpole cyst is 2.4 x 2.3 x 2.1 cm. Lower pole cyst is 1.9 x 1.3 x 1.8 cm. No hydronephrosis.  Left Kidney:  Length: 12.8 cm. Mild hydronephrosis is present.  Small cysts are identified in the midpole region, 2.4 x 1.7 x 2.4 cm, in the lower pole region measuring 2.0 x 2.0 x 2.3 cm and 1.6 x 1.6 x 1.5 cm. No solid mass.  Bladder:  Appears normal for degree of bladder distention.  IMPRESSION: 1. Bilateral renal cysts. 2. Mild left hydronephrosis. Consider further evaluation is CT of the abdomen and pelvis.   Electronically Signed   By: Nolon Nations M.D.   On: 01/22/2015 09:15   Dg Knee Complete 4 Views Right  01/20/2015   CLINICAL DATA:  Fall.  Pain.  Initial evaluation.  EXAM: RIGHT KNEE - COMPLETE 4+ VIEW  COMPARISON:  None.  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 Hardware intact. No acute bony abnormality. Soft tissue ossification most likely postsurgical . Loose bodies within the joint space. Peripheral vascular calcification.  IMPRESSION: No acute abnormality. 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 Hardware intact.   Electronically Signed   By: Marcello Moores  Register   On: 01/20/2015 15:58    CBC  Recent Labs Lab 01/20/15 1604 01/21/15 0645 01/25/15 0711  WBC 12.6* 9.5 12.7*  HGB 13.9 12.5* 11.9*  HCT 42.2 38.2* 37.0*  PLT 208 188 294  MCV 96.6 96.2 96.4  MCH 31.8 31.5 31.0  MCHC 32.9 32.7 32.2  RDW 15.4 15.3 15.2  LYMPHSABS 0.4*  --   --   MONOABS 1.3*  --   --   EOSABS 0.0  --   --   BASOSABS 0.0  --   --     Chemistries   Recent Labs Lab 01/20/15 1604 01/21/15 0645 01/22/15 0545 01/23/15 0500 01/24/15 0535 01/25/15 0711  NA 139 138 140 138 141 138  K 3.9 3.3* 3.9 3.9 4.2 4.3  CL 106 107 110 108 110 109  CO2 21* 21* 19* 20* 20* 21*  GLUCOSE 100* 98 95 91 92 83  BUN 50* 43* 41* 34* 37* 32*  CREATININE 3.37* 2.91* 2.60* 2.15* 1.95* 1.70*  CALCIUM 8.5* 7.8* 7.9* 7.7* 8.1* 7.9*  AST 40 20  --   --   --   --   ALT 33 24  --   --   --   --   ALKPHOS 77  62  --   --   --   --   BILITOT 0.7 0.9  --   --   --   --     ------------------------------------------------------------------------------------------------------------------ estimated creatinine clearance is 47.3 mL/min (by C-G formula based on Cr of 1.7). ------------------------------------------------------------------------------------------------------------------ No results for input(s): HGBA1C in the last 72 hours. ------------------------------------------------------------------------------------------------------------------ No results for input(s): CHOL, HDL, LDLCALC, TRIG, CHOLHDL, LDLDIRECT in the last 72 hours. ------------------------------------------------------------------------------------------------------------------ No results for input(s): TSH, T4TOTAL, T3FREE, THYROIDAB in the last 72 hours.  Invalid input(s): FREET3 ------------------------------------------------------------------------------------------------------------------ No results for input(s): VITAMINB12, FOLATE, FERRITIN, TIBC, IRON, RETICCTPCT in the last 72 hours.  Coagulation profile  Recent Labs Lab 01/22/15 1125 01/22/15 1136 01/23/15 0500 01/24/15 0535 01/25/15 0711  INR 3.73* 3.64* 3.22* 2.66* 2.80*    No results for input(s): DDIMER in the last 72 hours.  Cardiac Enzymes No results for input(s): CKMB, TROPONINI, MYOGLOBIN in the last 168 hours.  Invalid input(s): CK ------------------------------------------------------------------------------------------------------------------ Invalid input(s): POCBNP   Recent Labs  01/23/15 0802 01/24/15 0623 01/25/15 0735  GLUCAP 86 88 94     Cresta Riden DO. [facility] 01/25/2015, 12:01 PM  Pager: [phone]

## 2015-01-26 DIAGNOSIS — T465X1A Poisoning by other antihypertensive drugs, accidental (unintentional), initial encounter: Secondary | ICD-10-CM

## 2015-01-26 DIAGNOSIS — N170 Acute kidney failure with tubular necrosis: Secondary | ICD-10-CM

## 2015-01-26 DIAGNOSIS — N179 Acute kidney failure, unspecified: Secondary | ICD-10-CM | POA: Insufficient documentation

## 2015-01-26 DIAGNOSIS — N183 Chronic kidney disease, stage 3 (moderate): Secondary | ICD-10-CM

## 2015-01-26 DIAGNOSIS — N141 Nephropathy induced by other drugs, medicaments and biological substances: Secondary | ICD-10-CM

## 2015-01-26 LAB — PROTIME-INR
INR: 2.79 — ABNORMAL HIGH (ref 0.00–1.49)
Prothrombin Time: 29 seconds — ABNORMAL HIGH (ref 11.6–15.2)

## 2015-01-26 MED ORDER — SODIUM CHLORIDE 0.9 % IV BOLUS (SEPSIS): 250.0000 mL | Freq: Once | INTRAVENOUS | Status: DC

## 2015-01-26 MED ORDER — WARFARIN SODIUM 2.5 MG PO TABS
2.5000 mg | ORAL_TABLET | Freq: Once | ORAL | Status: AC
  Administered 2015-01-26: 2.5 mg via ORAL
  Filled 2015-01-26: qty 1

## 2015-01-26 MED ORDER — POLYETHYLENE GLYCOL 3350 17 G PO PACK
17.0000 g | PACK | Freq: Two times a day (BID) | ORAL | Status: DC
  Administered 2015-01-26 – 2015-01-27 (×4): 17 g via ORAL
  Filled 2015-01-26 (×4): qty 1

## 2015-01-26 NOTE — Clinical Social Work Note (Signed)
CSW received consult for skilled facility placement. Talked with patient and completed assessment (full assessment to follow) and he is agreeable to short-term rehab. Facility search initiated.   Nayson Traweek Givens, MSW, LCSW Licensed Clinical Social Worker [place] [phone]

## 2015-01-26 NOTE — Progress Notes (Signed)
ANTICOAGULATION CONSULT NOTE - Follow Up Consult  Pharmacy Consult for Warfarin Indication: atrial fibrillation  Allergies  Allergen Reactions  . Tramadol Nausea And Vomiting    Patient Measurements: Height: 6\' 1"  (185.4 cm) Weight: 235 lb (106.595 kg) IBW/kg (Calculated) : 79.9  Vital Signs: Temp: 98.7 F (37.1 C) (10/10 1000) Temp Source: Oral (10/10 1000) BP: 150/78 mmHg (10/10 1000) Pulse Rate: 88 (10/10 1000)  Labs:  Recent Labs  01/24/15 0535 01/25/15 0711 01/26/15 0443  HGB  --  11.9*  --   HCT  --  37.0*  --   PLT  --  294  --   LABPROT 28.0* 29.1* 29.0*  INR 2.66* 2.80* 2.79*  CREATININE 1.95* 1.70*  --     Estimated Creatinine Clearance: 47.4 mL/min (by C-G formula based on Cr of 1.7).   Assessment: 20 YOM admitted on 10/4 after a fall with several broken ribs. The patient was on warfarin PTA for hx Afib and presented on 10/4 with a SUPRAtherapeutic INR of 4.87. No reversal agents have been given. Imaging also revealed an obstructing 7 mm L-ureteral calculus - urology on board and plans for medical expulsion therapy/ recommending to continue Foley catheter for now and outpatient urodynamic testing.  Afib: rate controlled noted. INR is therapeutic at 2.79. No overt s/sx of bleeding noted. PTA: 5 mg daily EXCEPT for 2.5 mg on Mon/Fri  Goal of Therapy:  INR 2-3   Plan:  - Warfarin 2.5 mg PO tonight - Daily INR - Monitor for s/sx of bleeding   Nicole Cella, RPh Clinical Pharmacist Pager: [phone] 01/26/2015 3:05 PM

## 2015-01-26 NOTE — Progress Notes (Addendum)
[facility]                                                                              Patient Demographics  Nathan [name], is a 74 y.o. male, DOB - 19-Nov-1940, [place] date - 01/20/2015   Admitting Physician Allyne [name], MD  Outpatient Primary MD for the patient is [name], MD  LOS - 6   Chief Complaint  Patient presents with  . Fall  . Knee Pain  . Weakness       Brief HPI   Nathan [name] is a 74 y.o. male with history of atrial fibrillation, HTN, chronic anticoagulation, diverticulosis, CKD presented to the ED with falling episodes. Apparently the patient fell while trying to get out of the car. Patient sustained a knee injury as a result. Patient also complained about having generalized weakness. Patient was noted in the ED to have an elevated creatinine and was admitted for further assessment. Patient did report having right-sided chest wall pain after the fall and had no syncope. Patient had no nausea or vomiting noted. He has been on ACE inhibitor and also has been on diuretics.    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, continue IV fluids - Renal ultrasound showed left-sided hydronephrosis - CT of the abdomen and pelvis showed obstructing 7 mmHg left ureteral calculus, bilateral renal lesions Neurology following, recommending to continue Foley catheter for now and outpatient urodynamic testing Repeat CMP tomorrow     Essential hypertension with current hypotension - Hold Cardizem 360 mg by mouth daily and metoprolol 100 mg by mouth twice a day -Lisinopril was discontinued due to acute renal failure    Atrial fibrillation (HCC) on chronic anticoagulation - rate controlled, INR therapeutic     Fall with multiple rib fractures - Rib series showed right 7,8, 9, 10th  rib fractures  - Continue pain control - Incentive spirometry - rightKnee x-ray negative for any dislocation or fracture Patient agreeable to go to SNF    Hypokalemia - Resolved  Code Status: Full code   Family Communication: Discussed in detail with the patient, all imaging results, lab results explained to the patient    Disposition Plan: SNF when bed available   Time Spent in minutes 25 minutes  Procedures  Knee x-ray Renal ultrasound CT abdomen and pelvis  Consults   Urology    Medications  Scheduled Meds: . diltiazem  360 mg Oral Daily  . finasteride  5 mg Oral Daily  . metoprolol  100 mg Oral BID  . multivitamin with minerals  1 tablet Oral Daily  . sodium chloride  250 mL Intravenous Once  . sodium chloride  3 mL Intravenous Q12H  . Tdap  0.5 mL Intramuscular Once  . Warfarin - Pharmacist Dosing Inpatient   Does not apply q1800   Continuous Infusions: . sodium chloride 75 mL/hr at 01/25/15 1440   PRN Meds:.acetaminophen **OR** acetaminophen, hydrALAZINE, ondansetron **OR** ondansetron (ZOFRAN) IV, oxyCODONE   Antibiotics  Anti-infectives    Start     Dose/Rate Route Frequency Ordered Stop   01/21/15 0900  cefTRIAXone (ROCEPHIN) 1 g in dextrose 5 % 50 mL IVPB  Status:  Discontinued     1 g 100 mL/hr over 30 Minutes Intravenous Every 24 hours 01/21/15 0752 01/22/15 1123        Subjective:   Still complaining of some left flank pain, no nausea no vomiting   Objective:   Blood pressure 161/99, pulse 88, temperature 98.5 F (36.9 C), temperature source Oral, resp. rate 21, height 6\' 1"  (1.854 m), weight 106.595 kg (235 lb), SpO2 95 %.  Wt Readings from Last 3 Encounters:  01/25/15 106.595 kg (235 lb)  10/30/14 100.699 kg (222 lb)  04/08/14 100.88 kg (222 lb 6.4 oz)     Intake/Output Summary (Last 24 hours) at 01/26/15 1026 Last data filed at 01/26/15 6606  Gross per 24 hour  Intake   1500 ml  Output   1700 ml  Net   -200 ml     Exam  General: Alert and oriented x 3, NAD  CVS: S1 S2 clear, no mrg  Respiratory: right chest wall tenderness, RRR, S1S2 clear  Abdomen: Soft, NT, ND, NBS  Ext: no cyanosis clubbing or edema  Data Review   Micro Results Recent Results (from the past 240 hour(s))  Urine culture     Status: None   Collection Time: 01/20/15  5:48 PM  Result Value Ref Range Status   Specimen Description URINE, CLEAN CATCH  Final   Special Requests Normal  Final   Culture MULTIPLE SPECIES PRESENT, SUGGEST RECOLLECTION  Final   Report Status 01/21/2015 FINAL  Final    Radiology Reports Ct Abdomen Pelvis Wo Contrast  01/22/2015   CLINICAL DATA:  Hydronephrosis without pain. Recent fall with right rib fractures  EXAM: CT ABDOMEN AND PELVIS WITHOUT CONTRAST  TECHNIQUE: Multidetector CT imaging of the abdomen and pelvis was performed following the standard protocol without IV contrast.  COMPARISON:  Ultrasound 01/22/2015, CT 03/03/2006  FINDINGS: Small pleural effusions right greater than left. Adjacent consolidation/ atelectasis posteriorly in the lower lobes. Partially calcified pleural plaques are noted posteriorly. Minimally displaced fractures of the lateral aspect right ribs 7-10. No pneumothorax.  Scattered coronary calcifications.  Multiple low-attenuation well demarcated liver lesions. Some were seen on the prior 2007 scan, suggesting a benign etiology such as cysts.  Moderate inflammatory/edematous changes around the left kidney and renal collecting system. There is mild left hydronephrosis and ureterectasis down to the level of a 7 mm calculus at the level of the L4-5 interspace.  Hyperdense 18 mm left lower pole renal lesion. A few prominent left para-aortic lymph nodes measuring up to 9 mm short axis diameter.  Multiple right renal lesions, most low-attenuation and present previously consistent with cysts. There is a partially exophytic 2.9 cm soft tissue attenuation mass from the interpolar  region. Right nephrolithiasis without hydronephrosis.  Diffuse pancreatic parenchymal atrophy. Adrenal glands unremarkable.  Patchy aortoiliac arterial calcifications. 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  Moderate hiatal hernia. Small bowel and colon are nondilated. Urinary bladder decompressed by Foley catheter. Right hip arthroplasty hardware results in streak artifact degrading portions of the scan. No definite ascites. Prostatic enlargement with central coarse calcifications. No free air. Advanced spondylitic changes throughout the lumbar spine.  IMPRESSION: 1. Obstructing 7 mm mid left ureteral calculus. 2. Bilateral renal lesions as above. The exophytic right interpolar and hyperdense left lower pole lesions have progressed  since prior study. Further evaluation suggested to exclude neoplasm. 3. Additional chronic and ancillary findings as above.   Electronically Signed   By: Lucrezia Europe M.D.   On: 01/22/2015 19:08   Dg Ribs Unilateral W/chest Right  01/21/2015   CLINICAL DATA:  Pain.  Fall.  Initial evaluation.  EXAM: RIGHT RIBS AND CHEST - 3+ VIEW  COMPARISON:  02/25/2013.  FINDINGS: Cardiomegaly low lung volumes with basilar atelectasis. Right posterior lateral seventh, eighth, ninth, and tenth rib fractures are noted. These are angulated and displaced. No pneumothorax.  IMPRESSION: Right posterior lateral seventh, eighth, ninth, and tenth angulated and displaced rib fractures. No pneumothorax.   Electronically Signed   By: Marcello Moores  Register   On: 01/21/2015 13:54   US Renal  01/22/2015   CLINICAL DATA:  Acute renal failure. History hypertension, chronic renal and impairment.  EXAM: RENAL / URINARY TRACT ULTRASOUND COMPLETE  COMPARISON:  08/15/2007  FINDINGS: Right Kidney:  Length: 12.7 cm. Upper pole cyst is 9.6 x 10.2 x 8.7 cm. Midpole cyst is 2.4 x 2.3 x 2.1 cm. Lower pole cyst is 1.9 x 1.3 x 1.8 cm. No hydronephrosis.  Left Kidney:  Length: 12.8 cm. Mild hydronephrosis is present.  Small cysts are identified in the midpole region, 2.4 x 1.7 x 2.4 cm, in the lower pole region measuring 2.0 x 2.0 x 2.3 cm and 1.6 x 1.6 x 1.5 cm. No solid mass.  Bladder:  Appears normal for degree of bladder distention.  IMPRESSION: 1. Bilateral renal cysts. 2. Mild left hydronephrosis. Consider further evaluation is CT of the abdomen and pelvis.   Electronically Signed   By: Nolon Nations M.D.   On: 01/22/2015 09:15   Dg Knee Complete 4 Views Right  01/20/2015   CLINICAL DATA:  Fall.  Pain.  Initial evaluation.  EXAM: RIGHT KNEE - COMPLETE 4+ VIEW  COMPARISON:  None.  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 Hardware intact. No acute bony abnormality. Soft tissue ossification most likely postsurgical . Loose bodies within the joint space. Peripheral vascular calcification.  IMPRESSION: No acute abnormality. 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 Hardware intact.   Electronically Signed   By: Marcello Moores  Register   On: 01/20/2015 15:58    CBC  Recent Labs Lab 01/20/15 1604 01/21/15 0645 01/25/15 0711  WBC 12.6* 9.5 12.7*  HGB 13.9 12.5* 11.9*  HCT 42.2 38.2* 37.0*  PLT 208 188 294  MCV 96.6 96.2 96.4  MCH 31.8 31.5 31.0  MCHC 32.9 32.7 32.2  RDW 15.4 15.3 15.2  LYMPHSABS 0.4*  --   --   MONOABS 1.3*  --   --   EOSABS 0.0  --   --   BASOSABS 0.0  --   --     Chemistries   Recent Labs Lab 01/20/15 1604 01/21/15 0645 01/22/15 0545 01/23/15 0500 01/24/15 0535 01/25/15 0711  NA 139 138 140 138 141 138  K 3.9 3.3* 3.9 3.9 4.2 4.3  CL 106 107 110 108 110 109  CO2 21* 21* 19* 20* 20* 21*  GLUCOSE 100* 98 95 91 92 83  BUN 50* 43* 41* 34* 37* 32*  CREATININE 3.37* 2.91* 2.60* 2.15* 1.95* 1.70*  CALCIUM 8.5* 7.8* 7.9* 7.7* 8.1* 7.9*  AST 40 20  --   --   --   --   ALT 33 24  --   --   --   --   ALKPHOS 77 62  --   --   --   --  BILITOT 0.7 0.9  --   --   --   --     ------------------------------------------------------------------------------------------------------------------ estimated creatinine clearance is 47.4 mL/min (by C-G formula based on Cr of 1.7). ------------------------------------------------------------------------------------------------------------------ No results for input(s): HGBA1C in the last 72 hours. ------------------------------------------------------------------------------------------------------------------ No results for input(s): CHOL, HDL, LDLCALC, TRIG, CHOLHDL, LDLDIRECT in the last 72 hours. ------------------------------------------------------------------------------------------------------------------ No results for input(s): TSH, T4TOTAL, T3FREE, THYROIDAB in the last 72 hours.  Invalid input(s): FREET3 ------------------------------------------------------------------------------------------------------------------ No results for input(s): VITAMINB12, FOLATE, FERRITIN, TIBC, IRON, RETICCTPCT in the last 72 hours.  Coagulation profile  Recent Labs Lab 01/22/15 1136 01/23/15 0500 01/24/15 0535 01/25/15 0711 01/26/15 0443  INR 3.64* 3.22* 2.66* 2.80* 2.79*    No results for input(s): DDIMER in the last 72 hours.  Cardiac Enzymes No results for input(s): CKMB, TROPONINI, MYOGLOBIN in the last 168 hours.  Invalid input(s): CK ------------------------------------------------------------------------------------------------------------------ Invalid input(s): [place]  01/24/15 0623 01/25/15 0735  GLUCAP 88 65     Demarious Kapur DO. [facility] 01/26/2015, 10:26 AM  Pager: [phone]

## 2015-01-26 NOTE — Progress Notes (Signed)
Physical Therapy Treatment Patient Details Name: Nathan [name] MRN: [id number] DOB: 20-Oct-1940 Today's Date: 01/26/2015    History of Present Illness Nathan [name] is a 74 y.o. male with history of atrial fibrillation HTN chronic anticoagulation diverticulosis CKD presented to the ED with falling episodes. Apparently the patient fell while trying to get out of the car. Patient sustained a knee injury as a result. Patient has also been having generalized weakness. Patient was noted in the ED to have an elevated creatinine and is being admitted for further assessment. Patient had no chest pain and had no syncope.  01/23/15 xray shows Rt rib fractures.    PT Comments    Pt was able to stand with dense assistance but is very weak and not able to take a step to the bed or out from chair.  He was able to use UE's well but could not compensate for LE lack of control.  Will recommend SNF and pt is likely wanting to go directly home but will struggle.  Follow Up Recommendations  SNF     Equipment Recommendations  Rolling walker with 5" wheels;3in1 (PT)    Recommendations for Other Services OT consult     Precautions / Restrictions Precautions Precautions: Fall Precaution Comments: Rt rib fractures Restrictions Weight Bearing Restrictions: No    Mobility  Bed Mobility               General bed mobility comments: up in chair when PT entered  Transfers Overall transfer level: Needs assistance Equipment used: Rolling walker (2 wheeled) Transfers: Sit to/from Stand Sit to Stand: Max assist;Total assist         General transfer comment: reminders for hands, counted to power up and reminded to extend knees  Ambulation/Gait         Gait velocity: unable due to standing difficulty       Stairs            Wheelchair Mobility    Modified Rankin (Stroke Patients Only)       Balance Overall balance assessment: Needs assistance Sitting-balance support: Feet  supported Sitting balance-Leahy Scale: Fair Sitting balance - Comments: consistent posterior lean, which he was able to correct with verbal and tactile cueing, but would drift back into post lean within 15 seconds; fear of falling  Postural control: Posterior lean Standing balance support: Bilateral upper extremity supported Standing balance-Leahy Scale: Poor Standing balance comment: weak in LE's with attempted standing                    Cognition Arousal/Alertness: Awake/alert Behavior During Therapy: WFL for tasks assessed/performed Overall Cognitive Status: Within Functional Limits for tasks assessed                      Exercises General Exercises - Lower Extremity Ankle Circles/Pumps: AROM;Both;10 reps Quad Sets: AROM;Both;10 reps Short Arc Quad: Strengthening;Both;10 reps Heel Slides: Strengthening;Both;10 reps Hip ABduction/ADduction: Strengthening;10 reps;Both    General Comments General comments (skin integrity, edema, etc.): Pt was much less able to work today and very motivated to try.  Has buckling knees and cannot stand up fully on either attempt.      Pertinent Vitals/Pain Pain Assessment: 0-10 Pain Score: 4  Faces Pain Scale: Hurts little more Pain Location: R ribs Pain Descriptors / Indicators: Sore Pain Intervention(s): Limited activity within patient's tolerance;Monitored during session;Premedicated before session;Repositioned    Home Living  Prior Function            PT Goals (current goals can now be found in the care plan section) Acute Rehab PT Goals Patient Stated Goal: to get moving again Progress towards PT goals: Progressing toward goals    Frequency  Min 3X/week    PT Plan Current plan remains appropriate    Co-evaluation             End of Session Equipment Utilized During Treatment: Gait belt Activity Tolerance: Patient limited by pain;Patient limited by fatigue Patient left: in  chair;with call bell/phone within reach;with family/visitor present     Time: 9563-8756 PT Time Calculation (min) (ACUTE ONLY): 20 min  Charges:  $Therapeutic Activity: 8-22 mins                    G Codes:      Ramond Dial 2015-02-21, 5:55 PM  Mee Hives, PT MS Acute Rehab Dept. Number: [facility] [phone] and [place] [phone]

## 2015-01-26 NOTE — Care Management Important Message (Signed)
Important Message  Patient Details  Name: Nathan [name] MRN: [id number] Date of Birth: 1941/01/30   Medicare Important Message Given:  Yes-third notification given    Delorse Lek 01/26/2015, 2:29 PM

## 2015-01-27 DIAGNOSIS — I10 Essential (primary) hypertension: Secondary | ICD-10-CM

## 2015-01-27 DIAGNOSIS — I480 Paroxysmal atrial fibrillation: Secondary | ICD-10-CM

## 2015-01-27 LAB — PROTIME-INR
INR: 2.43 — ABNORMAL HIGH (ref 0.00–1.49)
Prothrombin Time: 26.1 seconds — ABNORMAL HIGH (ref 11.6–15.2)

## 2015-01-27 LAB — COMPREHENSIVE METABOLIC PANEL
ALT: 42 U/L (ref 17–63)
AST: 26 U/L (ref 15–41)
Albumin: 1.9 g/dL — ABNORMAL LOW (ref 3.5–5.0)
Alkaline Phosphatase: 79 U/L (ref 38–126)
Anion gap: 9 (ref 5–15)
BUN: 25 mg/dL — ABNORMAL HIGH (ref 6–20)
CO2: 23 mmol/L (ref 22–32)
Calcium: 8.4 mg/dL — ABNORMAL LOW (ref 8.9–10.3)
Chloride: 109 mmol/L (ref 101–111)
Creatinine, Ser: 1.41 mg/dL — ABNORMAL HIGH (ref 0.61–1.24)
GFR calc Af Amer: 54 mL/min — ABNORMAL LOW (ref >60)
GFR calc non Af Amer: 47 mL/min — ABNORMAL LOW (ref >60)
Glucose, Bld: 83 mg/dL (ref 65–99)
Potassium: 5 mmol/L (ref 3.5–5.1)
Sodium: 141 mmol/L (ref 135–145)
Total Bilirubin: 0.7 mg/dL (ref 0.3–1.2)
Total Protein: 6.1 g/dL — ABNORMAL LOW (ref 6.5–8.1)

## 2015-01-27 LAB — CBC
HCT: 38.1 % — ABNORMAL LOW (ref 39.0–52.0)
Hemoglobin: 12.2 g/dL — ABNORMAL LOW (ref 13.0–17.0)
MCH: 30.8 pg (ref 26.0–34.0)
MCHC: 32 g/dL (ref 30.0–36.0)
MCV: 96.2 fL (ref 78.0–100.0)
Platelets: 372 10*3/uL (ref 150–400)
RBC: 3.96 MIL/uL — ABNORMAL LOW (ref 4.22–5.81)
RDW: 14.9 % (ref 11.5–15.5)
WBC: 10 10*3/uL (ref 4.0–10.5)

## 2015-01-27 MED ORDER — DILTIAZEM HCL ER COATED BEADS 360 MG PO CP24: 360.0000 mg | ORAL_CAPSULE | Freq: Every day | ORAL | Status: DC

## 2015-01-27 MED ORDER — POLYETHYLENE GLYCOL 3350 17 G PO PACK
17.0000 g | PACK | Freq: Two times a day (BID) | ORAL | Status: DC
Start: 2015-01-27 — End: 2015-03-31

## 2015-01-27 MED ORDER — WARFARIN SODIUM 5 MG PO TABS
5.0000 mg | ORAL_TABLET | Freq: Once | ORAL | Status: AC
  Administered 2015-01-27: 5 mg via ORAL
  Filled 2015-01-27: qty 1

## 2015-01-27 MED ORDER — WARFARIN SODIUM 5 MG PO TABS: ORAL_TABLET | ORAL | Status: DC

## 2015-01-27 MED ORDER — OXYCODONE HCL 5 MG PO TABS: 5.0000 mg | ORAL_TABLET | Freq: Four times a day (QID) | ORAL | Status: DC | PRN

## 2015-01-27 NOTE — Progress Notes (Signed)
Report called to [facility], spoke with Vaughan Basta. Reported that foley will be left in on DC.

## 2015-01-27 NOTE — Clinical Social Work Note (Signed)
Clinical Social Work Assessment  Patient Details  Name: Nathan [name] MRN: [id number] Date of Birth: December 21, 1940  Date of referral:  01/25/15               Reason for consult:  Facility Placement                Permission sought to share information with:  Family Supports Permission granted to share information::  Yes, Verbal Permission Granted  Name::     Garret Teale and Delfin Edis, Brooke Bonito.  Agency::  Skilled nursing facilities  Relationship::  Sons  Contact Information:  [phone] and [phone]  Housing/Transportation Living arrangements for the past 2 months:  [place] of Information:  Patient Patient Interpreter Needed:  None Criminal Activity/Legal Involvement Pertinent to Current Situation/Hospitalization:  No - Comment as needed Significant Relationships:  Adult Children, Significant Other (Patient girlfriend is Solmon Ice.) Lives with:  Self Do you feel safe going back to the place where you live?  Yes (Patient feels safe at home but realizes that he needs rehab before returning home) Need for family participation in patient care:  Yes (Comment)  Care giving concerns:  None expressed by patient.   Social Worker assessment / plan:  On 01/26/15 CSW talked with patient regarding discharge planning and recommendation of ST rehab. [name] was alert, oriented, pleasant and open to talking with CSW.  Patient in agreement with rehab and informed CSW that his girlfriend has been to rehab before.  [name] was provided with a skilled facility list of [facility] and facility search process explained and patient expressed understanding.   Employment status:  Retired Nurse, adult PT Recommendations:  [place] / Referral to community resources:  [place]  Patient/Family's Response to care:  No concerns reported regarding care during hospitalization.  Patient/Family's  Understanding of and Emotional Response to Diagnosis, Current Treatment, and Prognosis:  Not discussed.  Emotional Assessment Appearance:  Appears stated age Attitude/Demeanor/Rapport:  Other (Appropriate, friendly) Affect (typically observed):  Appropriate, Pleasant, Accepting Orientation:  Oriented to Self, Oriented to Place, Oriented to  Time, Oriented to Situation Alcohol / Substance use:  Tobacco Use, Alcohol Use (Patient reports that he quit smoking 20 plus years ago and that he does drink approx. 1/2 ounces of alcohol  per week, but does not use illicit drugs.) Psych involvement (Current and /or in the community):  No (Comment)  Discharge Needs  Concerns to be addressed:  Discharge Planning Concerns Readmission within the last 30 days:  No Current discharge risk:  None Barriers to Discharge:  No Barriers Identified   Sable Feil, LCSW 01/27/2015, 5:31 PM

## 2015-01-27 NOTE — Discharge Summary (Signed)
Physician Discharge Summary  RONNE [name] MRN: [id number] DOB/AGE: 11/08/1940 74 y.o.  PCP: Nathan Cobble, MD   Admit date: 01/20/2015 Discharge date: 01/27/2015  Discharge Diagnoses:   Principal Problem:   Acute renal failure superimposed on stage 3 chronic kidney disease ([place]) Active Problems:   Essential hypertension   Atrial fibrillation (HCC)   Acute-on-chronic kidney injury (Nathan [name])   Chronic anticoagulation   Fall   Hypokalemia   Pressure ulcer   Acute renal failure (Nathan [name])    Follow-up recommendations Follow-up with PCP in 3-5 days , including all  additional recommended appointments as below Follow-up CBC, CMP in 3-5 days Patient to continue with Foley and discharge Recommend INR check every 72 hours Follow-up with urology, Dr. Louis Meckel  next week-please call urology office to confirm appointment     Medication List    STOP taking these medications        diltiazem 360 MG 24 hr capsule  Commonly known as:  TIAZAC     furosemide 40 MG tablet  Commonly known as:  LASIX     lisinopril 40 MG tablet  Commonly known as:  PRINIVIL,ZESTRIL     traMADol 50 MG tablet  Commonly known as:  ULTRAM      TAKE these medications        acetaminophen 650 MG CR tablet  Commonly known as:  TYLENOL  Take 650 mg by mouth every 8 (eight) hours as needed for pain.     allopurinol 100 MG tablet  Commonly known as:  ZYLOPRIM  Take 1 tablet (100 mg total) by mouth daily.     diltiazem 360 MG 24 hr capsule  Commonly known as:  CARDIZEM CD  Take 1 capsule (360 mg total) by mouth daily.     finasteride 5 MG tablet  Commonly known as:  PROSCAR  Take 5 mg by mouth daily. Rx'ed by [name]     metoprolol 100 MG tablet  Commonly known as:  LOPRESSOR  TAKE 1 TABLET BY MOUTH TWICE A DAY     multivitamin with minerals tablet  Take 1 tablet by mouth daily.     oxyCODONE 5 MG immediate release tablet  Commonly known as:  Oxy IR/ROXICODONE  Take 1 tablet (5 mg  total) by mouth every 6 (six) hours as needed for moderate pain.     polyethylene glycol packet  Commonly known as:  MIRALAX / GLYCOLAX  Take 17 g by mouth 2 (two) times daily.     warfarin 5 MG tablet  Commonly known as:  COUMADIN  Monday Wednesday Friday Saturday Sunday-2.5 mg Tuesday/Thursday-5 mg         Discharge Condition: Stable    Disposition: 01-Home or Self Care   Consults:  Urology    Significant Diagnostic Studies:  Ct Abdomen Pelvis Wo Contrast  01/22/2015   CLINICAL DATA:  Hydronephrosis without pain. Recent fall with right rib fractures  EXAM: CT ABDOMEN AND PELVIS WITHOUT CONTRAST  TECHNIQUE: Multidetector CT imaging of the abdomen and pelvis was performed following the standard protocol without IV contrast.  COMPARISON:  Ultrasound 01/22/2015, CT 03/03/2006  FINDINGS: Small pleural effusions right greater than left. Adjacent consolidation/ atelectasis posteriorly in the lower lobes. Partially calcified pleural plaques are noted posteriorly. Minimally displaced fractures of the lateral aspect right ribs 7-10. No pneumothorax.  Scattered coronary calcifications.  Multiple low-attenuation well demarcated liver lesions. Some were seen on the prior 2007 scan, suggesting a benign etiology such as cysts.  Moderate inflammatory/edematous changes  around the left kidney and renal collecting system. There is mild left hydronephrosis and ureterectasis down to the level of a 7 mm calculus at the level of the L4-5 interspace.  Hyperdense 18 mm left lower pole renal lesion. A few prominent left para-aortic lymph nodes measuring up to 9 mm short axis diameter.  Multiple right renal lesions, most low-attenuation and present previously consistent with cysts. There is a partially exophytic 2.9 cm soft tissue attenuation mass from the interpolar region. Right nephrolithiasis without hydronephrosis.  Diffuse pancreatic parenchymal atrophy. Adrenal glands unremarkable.  Patchy aortoiliac  arterial calcifications. 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  Moderate hiatal hernia. Small bowel and colon are nondilated. Urinary bladder decompressed by Foley catheter. Right hip arthroplasty hardware results in streak artifact degrading portions of the scan. No definite ascites. Prostatic enlargement with central coarse calcifications. No free air. Advanced spondylitic changes throughout the lumbar spine.  IMPRESSION: 1. Obstructing 7 mm mid left ureteral calculus. 2. Bilateral renal lesions as above. The exophytic right interpolar and hyperdense left lower pole lesions have progressed since prior study. Further evaluation suggested to exclude neoplasm. 3. Additional chronic and ancillary findings as above.   Electronically Signed   By: Lucrezia Europe M.D.   On: 01/22/2015 19:08   Dg Ribs Unilateral W/chest Right  01/21/2015   CLINICAL DATA:  Pain.  Fall.  Initial evaluation.  EXAM: RIGHT RIBS AND CHEST - 3+ VIEW  COMPARISON:  02/25/2013.  FINDINGS: Cardiomegaly low lung volumes with basilar atelectasis. Right posterior lateral seventh, eighth, ninth, and tenth rib fractures are noted. These are angulated and displaced. No pneumothorax.  IMPRESSION: Right posterior lateral seventh, eighth, ninth, and tenth angulated and displaced rib fractures. No pneumothorax.   Electronically Signed   By: Marcello Moores  Register   On: 01/21/2015 13:54   US Renal  01/22/2015   CLINICAL DATA:  Acute renal failure. History hypertension, chronic renal and impairment.  EXAM: RENAL / URINARY TRACT ULTRASOUND COMPLETE  COMPARISON:  08/15/2007  FINDINGS: Right Kidney:  Length: 12.7 cm. Upper pole cyst is 9.6 x 10.2 x 8.7 cm. Midpole cyst is 2.4 x 2.3 x 2.1 cm. Lower pole cyst is 1.9 x 1.3 x 1.8 cm. No hydronephrosis.  Left Kidney:  Length: 12.8 cm. Mild hydronephrosis is present. Small cysts are identified in the midpole region, 2.4 x 1.7 x 2.4 cm, in the lower pole region measuring 2.0 x 2.0 x 2.3 cm and 1.6 x 1.6 x 1.5 cm.  No solid mass.  Bladder:  Appears normal for degree of bladder distention.  IMPRESSION: 1. Bilateral renal cysts. 2. Mild left hydronephrosis. Consider further evaluation is CT of the abdomen and pelvis.   Electronically Signed   By: Nolon Nations M.D.   On: 01/22/2015 09:15   Dg Knee Complete 4 Views Right  01/20/2015   CLINICAL DATA:  Fall.  Pain.  Initial evaluation.  EXAM: RIGHT KNEE - COMPLETE 4+ VIEW  COMPARISON:  None.  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 Hardware intact. No acute bony abnormality. Soft tissue ossification most likely postsurgical . Loose bodies within the joint space. Peripheral vascular calcification.  IMPRESSION: No acute abnormality. 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 Hardware intact.   Electronically Signed   By: Marcello Moores  Register   On: 01/20/2015 15:58        Filed Weights   01/24/15 2043 01/25/15 2021 01/26/15 2105  Weight: 106.414 kg (234 lb 9.6 oz) 106.595 kg (235 lb)  104.8 kg (231 lb 0.7 oz)     Microbiology: Recent Results (from the past 240 hour(s))  Urine culture     Status: None   Collection Time: 01/20/15  5:48 PM  Result Value Ref Range Status   Specimen Description URINE, CLEAN CATCH  Final   Special Requests Normal  Final   Culture MULTIPLE SPECIES PRESENT, SUGGEST RECOLLECTION  Final   Report Status 01/21/2015 FINAL  Final       Blood Culture    Component Value Date/Time   SDES URINE, CLEAN CATCH 01/20/2015 1748   SPECREQUEST Normal 01/20/2015 1748   CULT MULTIPLE SPECIES PRESENT, SUGGEST RECOLLECTION 01/20/2015 1748   REPTSTATUS 01/21/2015 FINAL 01/20/2015 1748      Labs: Results for orders placed or performed during the hospital encounter of 01/20/15 (from the past 48 hour(s))  Protime-INR     Status: Abnormal   Collection Time: 01/26/15  4:43 AM  Result Value Ref Range   Prothrombin Time 29.0 (H) 11.6 - 15.2 seconds   INR 2.79 (H) 0.00 - 1.49  Protime-INR     Status:  Abnormal   Collection Time: 01/27/15  6:18 AM  Result Value Ref Range   Prothrombin Time 26.1 (H) 11.6 - 15.2 seconds   INR 2.43 (H) 0.00 - 1.49  CBC     Status: Abnormal   Collection Time: 01/27/15  6:18 AM  Result Value Ref Range   WBC 10.0 4.0 - 10.5 K/uL   RBC 3.96 (L) 4.22 - 5.81 MIL/uL   Hemoglobin 12.2 (L) 13.0 - 17.0 g/dL   HCT 38.1 (L) 39.0 - 52.0 %   MCV 96.2 78.0 - 100.0 fL   MCH 30.8 26.0 - 34.0 pg   MCHC 32.0 30.0 - 36.0 g/dL   RDW 14.9 11.5 - 15.5 %   Platelets 372 150 - 400 K/uL  Comprehensive metabolic panel     Status: Abnormal   Collection Time: 01/27/15  6:18 AM  Result Value Ref Range   Sodium 141 135 - 145 mmol/L   Potassium 5.0 3.5 - 5.1 mmol/L   Chloride 109 101 - 111 mmol/L   CO2 23 22 - 32 mmol/L   Glucose, Bld 83 65 - 99 mg/dL   BUN 25 (H) 6 - 20 mg/dL   Creatinine, Ser 1.41 (H) 0.61 - 1.24 mg/dL   Calcium 8.4 (L) 8.9 - 10.3 mg/dL   Total Protein 6.1 (L) 6.5 - 8.1 g/dL   Albumin 1.9 (L) 3.5 - 5.0 g/dL   AST 26 15 - 41 U/L   ALT 42 17 - 63 U/L   Alkaline Phosphatase 79 38 - 126 U/L   Total Bilirubin 0.7 0.3 - 1.2 mg/dL   GFR calc non Af Amer 47 (L) >60 mL/min   GFR calc Af Amer 54 (L) >60 mL/min    Comment: (NOTE) The eGFR has been calculated using the CKD EPI equation. This calculation has not been validated in all clinical situations. eGFR's persistently <60 mL/min signify possible Chronic Kidney Disease.    Anion gap 9 5 - 15     Lipid Panel     Component Value Date/Time   CHOL 103 01/21/2015 0645   TRIG 92 01/21/2015 0645   HDL 27* 01/21/2015 0645   CHOLHDL 3.8 01/21/2015 0645   VLDL 18 01/21/2015 0645   LDLCALC 58 01/21/2015 0645     Lab Results  Component Value Date   HGBA1C 5.4 01/20/2015     Lab Results  Component Value  Date   [place] 58 01/21/2015   CREATININE 1.41* 01/27/2015     Nathan [name] is a 74 y.o. male with history of atrial fibrillation, HTN, chronic anticoagulation, diverticulosis, CKD presented to  the ED with falling episodes. Apparently the patient fell while trying to get out of the car. Patient sustained a knee injury as a result. Patient also complained about having generalized weakness. Patient was noted in the ED to have an elevated creatinine and was admitted for further assessment. Patient did report having right-sided chest wall pain after the fall and had no syncope. Patient had no nausea or vomiting noted. He has been on ACE inhibitor and also has been on diuretics.    Assessment & Plan     Acute renal failure superimposed on stage 3 chronic kidney disease [facility]): Presenting with creatinine of 3.37, baseline 1.4 - Likely secondary to postobstructive nephropathy, renal failure back to baseline with IV hydration - Renal ultrasound showed left-sided hydronephrosis - CT of the abdomen and pelvis showed obstructing 7 mmHg left ureteral calculus, bilateral renal lesions Urology following, recommending to continue Foley catheter for now and outpatient urodynamic testing next week Follow BMP in 3-5 days    Essential hypertension with current hypotension - Hold Cardizem 360 mg by mouth daily and metoprolol 100 mg by mouth twice a day -Lisinopril was discontinued due to acute renal failure   Atrial fibrillation (HCC) on chronic anticoagulation - rate controlled, INR therapeutic, which recheck INR every 72 hours    Fall with multiple rib fractures - Rib series showed right 7,8, 9, 10th rib fractures  - Continue pain control - Incentive spirometry - rightKnee x-ray negative for any dislocation or fracture Patient agreeable to go to SNF   Hypokalemia - Resolved  Code Status: Full code          Discharge Exam:  Blood pressure 139/87, pulse 98, temperature 98 F (36.7 C), temperature source Oral, resp. rate 20, height $RemoveBe'6\' 1"'yozzEifUv$  (1.854 m), weight 104.8 kg (231 lb 0.7 oz), SpO2 98 %.    General: Alert and oriented x 3, NAD  CVS: S1 S2 clear, no mrg  Respiratory:  right chest wall tenderness, RRR, S1S2 clear  Abdomen: Soft, NT, ND, NBS  Ext: no cyanosis clubbing or edema        Follow-up Information    Follow up with Nathan Cobble, MD. Schedule an appointment as soon as possible for a visit in 3 days.   Specialty:  Internal Medicine   Contact information:   [address] [place] [address] [phone]       Follow up with Ardis Hughs, MD. Schedule an appointment as soon as possible for a visit in 3 days.   Specialty:  Urology   Contact information:   [place] [id number] [phone]       Signed: Reyne Dumas 01/27/2015, 10:40 AM        Time spent >45 mins

## 2015-01-27 NOTE — Progress Notes (Signed)
ANTICOAGULATION CONSULT NOTE - Follow Up Consult  Pharmacy Consult for Warfarin Indication: atrial fibrillation  Allergies  Allergen Reactions  . Tramadol Nausea And Vomiting    Patient Measurements: Height: 6\' 1"  (185.4 cm) Weight: 231 lb 0.7 oz (104.8 kg) IBW/kg (Calculated) : 79.9  Vital Signs: Temp: 98 F (36.7 C) (10/11 1028) Temp Source: Oral (10/11 1028) BP: 139/87 mmHg (10/11 1028) Pulse Rate: 98 (10/11 1028)  Labs:  Recent Labs  01/25/15 0711 01/26/15 0443 01/27/15 0618  HGB 11.9*  --  12.2*  HCT 37.0*  --  38.1*  PLT 294  --  372  LABPROT 29.1* 29.0* 26.1*  INR 2.80* 2.79* 2.43*  CREATININE 1.70*  --  1.41*    Estimated Creatinine Clearance: 56.7 mL/min (by C-G formula based on Cr of 1.41).   Assessment: Today the INR is 2.43, remains therapeutic in this 1 YOM admitted on 10/4 after a fall with several broken ribs. The patient was on warfarin PTA for hx Afib and presented on 10/4 with a SUPRAtherapeutic INR of 4.87. No reversal agents have been given.  Atrial fibrillation: rate controlled, INR therpeutic. Hgb 12.2 stable, pltc wnl. No bleeding.  PTA: 5 mg daily EXCEPT for 2.5 mg on Mon/Fri, last taken 10/4 INR on admission 10/4 was SUPRAtherapeutic on the above dosage. INR now therapeutic after coumadin held 10/5-10/7, resumed 10/8 with 5mg , then 2.5mg  and 2.5mg  yesterday.   Goal of Therapy:  INR 2-3   Plan:  - Warfarin 5 mg PO tonight - If discharged to SNF today, I recommend coumadin 2.5 mg every MWFSS and  5 mg every Tues and Thurs.  Need close INR follow up until INR stable within therapeutic range. MD plans to recheck INR q72hours after discharge to SNF.  - Daily INR while inpatient - Monitor for s/sx of bleeding   Nicole Cella, RPh Clinical Pharmacist Pager: [phone] 01/27/2015 2:05 PM

## 2015-01-27 NOTE — Progress Notes (Signed)
Telemetry box removed and returned.

## 2015-01-27 NOTE — Progress Notes (Signed)
Occupational Therapy Treatment Patient Details Name: Nathan [name] MRN: [id number] DOB: 1940-08-19 Today's Date: 01/27/2015    History of present illness Nathan [name] is a 74 y.o. male with history of atrial fibrillation HTN chronic anticoagulation diverticulosis CKD presented to the ED with falling episodes. Apparently the patient fell while trying to get out of the car. Patient sustained a knee injury as a result. Patient has also been having generalized weakness. Patient was noted in the ED to have an elevated creatinine and is being admitted for further assessment. Patient had no chest pain and had no syncope.  01/23/15 xray shows Rt rib fractures.   OT comments  Pt appears to be improving with LE adls and adl mobility requiring +1 assist to get to standing today and tolerated standing for 3+ minutes while being cleaned on back side.  Pt goals were inadvertently left off of initial eval and are now in this note and in Care Plan.  Pt making good progress but feel, at this point, he would benefit from SNF before returning home due to decreased assist at home.  Follow Up Recommendations  SNF    Equipment Recommendations  None recommended by OT    Recommendations for Other Services      Precautions / Restrictions Precautions Precautions: Fall Precaution Comments: Rt rib fractures Restrictions Weight Bearing Restrictions: No       Mobility Bed Mobility               General bed mobility comments: up in chair on arrival.  Transfers Overall transfer level: Needs assistance Equipment used: Rolling walker (2 wheeled) Transfers: Sit to/from [facility] Sit to Stand: Mod assist Stand pivot transfers: Mod assist       General transfer comment: pt easier mod assist today.  Pt counted and pushed with hands w/o cues.    Balance Overall balance assessment: Needs assistance Sitting-balance support: Feet supported Sitting balance-Leahy Scale: Fair Sitting  balance - Comments: posterior lean present but was able to correct w/o cuing Postural control: Posterior lean Standing balance support: Bilateral upper extremity supported;During functional activity Standing balance-Leahy Scale: Poor Standing balance comment: Pt able to reach full standing today with a walker.  Pt could not stand for more than 3-4 minutes at a time.                   ADL Overall ADL's : Needs assistance/impaired Eating/Feeding: Independent;Sitting Eating/Feeding Details (indicate cue type and reason): Pt can feed self sitting in chair.  Rec chair during all meals. Grooming: Oral care;Wash/dry face;Wash/dry hands;Set up;Sitting Grooming Details (indicate cue type and reason): Pt can stand at the sink but only for short amount of time.  Pt must hold to sink with one hand while grooming with the other.  To conserve energy after adls, pt groomed in chair with set up. Upper Body Bathing: Set up;Sitting   Lower Body Bathing: Maximal assistance;Sit to/from stand Lower Body Bathing Details (indicate cue type and reason): max assist to reach lower legs.  pt unable to wash self in standing b/c B hands must be on the walker. Upper Body Dressing : Set up;Sitting   Lower Body Dressing: Maximal assistance;Sit to/from stand Lower Body Dressing Details (indicate cue type and reason): max assist to start pants, donn socks and shoes and to pull pants up.  Pt cannot pull pants up and fasten bc he cannot let go of walker. Toilet Transfer: Moderate assistance;Stand-pivot;BSC;RW Toilet Transfer Details (indicate cue type and  reason): once up on feet, pt transferred well.   Toileting- Clothing Manipulation and Hygiene: Total assistance;+2 for physical assistance;Sit to/from stand Toileting - Clothing Manipulation Details (indicate cue type and reason): Pt cannot let go of walker to manage clothing.     Functional mobility during ADLs: Moderate assistance;Rolling walker General ADL  Comments: Pt with increased strength and activity tolerance today. Pt able to stand to get on and off commode and stood for appx 4 minutes while being cleaned after bowel movement.      Vision                     Perception     Praxis      Cognition   Behavior During Therapy: WFL for tasks assessed/performed Overall Cognitive Status: Within Functional Limits for tasks assessed                       Extremity/Trunk Assessment               Exercises     Shoulder Instructions       General Comments      Pertinent Vitals/ Pain       Pain Assessment: Faces Pain Score: 3  Faces Pain Scale: Hurts little more Pain Location: R ribs. Pain Descriptors / Indicators: Sore Pain Intervention(s): Limited activity within patient's tolerance;Monitored during session;Repositioned  Home Living                                          Prior Functioning/Environment              Frequency Min 2X/week     Progress Toward Goals  OT Goals(current goals can now be found in the care plan section)  Progress towards OT goals: Progressing toward goals (goals inadvertantly left off of eval 10/6 and set today)  Acute Rehab OT Goals Patient Stated Goal: to get moving again OT Goal Formulation: With patient Time For Goal Achievement: 02/05/15 Potential to Achieve Goals: Good ADL Goals Pt Will Perform Grooming: with supervision;standing (w chair behind to sit if needed) Pt Will Perform Lower Body Bathing: with min assist;sit to/from stand Pt Will Perform Lower Body Dressing: with min assist;with adaptive equipment;sit to/from stand Pt Will Transfer to Toilet: with min assist;bedside commode;ambulating Pt Will Perform Toileting - Clothing Manipulation and hygiene: with min assist;sit to/from stand Additional ADL Goal #1: Pt will complete adl in standing for 2 minutes with min assist to increase standing tolerance for future adls.  Plan Discharge  plan remains appropriate    Co-evaluation                 End of Session Equipment Utilized During Treatment: Rolling walker   Activity Tolerance Patient tolerated treatment well   Patient Left in chair;with call bell/phone within reach;with chair alarm set   Nurse Communication Mobility status        Time: 1040-1110 OT Time Calculation (min): 30 min  Charges: OT General Charges $OT Visit: 1 Procedure OT Treatments $Self Care/Home Management : 23-37 mins  Glenford Peers 01/27/2015, 11:27 AM  [phone]

## 2015-01-27 NOTE — Clinical Social Work Placement (Addendum)
   CLINICAL SOCIAL WORK PLACEMENT  NOTE PATIENT DISCHARGING TO [facility] 01/27/15 VIA [facility]AR  Date:  01/27/2015  Patient Details  Name: Nathan [name] MRN: [id number] Date of Birth: 1940-05-13  Clinical Social Work is seeking post-discharge placement for this patient at the [place] level of care (*CSW will initial, date and re-position this form in  chart as items are completed):  Yes   Patient/family provided with [facility] Work Department's list of facilities offering this level of care within the geographic area requested by the patient (or if unable, by the patient's family).  Yes   Patient/family informed of their freedom to choose among providers that offer the needed level of care, that participate in Medicare, Medicaid or managed care program needed by the patient, have an available bed and are willing to accept the patient.  Yes   Patient/family informed of [facility]'s ownership interest in [facility] and [facility], as well as of the fact that they are under no obligation to receive care at these facilities.  PASRR submitted to EDS on 01/27/15     PASRR number received on 01/27/15     Existing PASRR number confirmed on       FL2 transmitted to all facilities in geographic area requested by pt/family on 01/27/15     FL2 transmitted to all facilities within larger geographic area on       Patient informed that his/her managed care company has contracts with or will negotiate with certain facilities, including the following:        Yes   Patient/family informed of bed offers received.  Patient chooses bed at [facility]     Physician recommends and patient chooses bed at      Patient to be transferred to [facility] on 01/27/15.  Patient to be transferred to facility by Ambulance [facility])     Patient family notified on 01/27/15 of transfer.  Name of family member notified:  Solmon Ice and sons (Patient informed CSW  that he had already called his girlfriend and sons regarding his discharge. Mr. Letourneau informed CSW that he will also contact his girfriend once he arrives at facility.)     PHYSICIAN       Additional Comment:    _______________________________________________ Sable Feil, LCSW 01/27/2015, 5:43 PM

## 2015-01-28 ENCOUNTER — Non-Acute Institutional Stay (SKILLED_NURSING_FACILITY): Payer: Medicare Other | Admitting: Adult Health

## 2015-01-28 ENCOUNTER — Encounter: Payer: Self-pay | Admitting: Adult Health

## 2015-01-28 DIAGNOSIS — R531 Weakness: Secondary | ICD-10-CM

## 2015-01-28 DIAGNOSIS — N183 Chronic kidney disease, stage 3 unspecified: Secondary | ICD-10-CM

## 2015-01-28 DIAGNOSIS — S2241XS Multiple fractures of ribs, right side, sequela: Secondary | ICD-10-CM | POA: Diagnosis not present

## 2015-01-28 DIAGNOSIS — E43 Unspecified severe protein-calorie malnutrition: Secondary | ICD-10-CM

## 2015-01-28 DIAGNOSIS — M109 Gout, unspecified: Secondary | ICD-10-CM | POA: Diagnosis not present

## 2015-01-28 DIAGNOSIS — I10 Essential (primary) hypertension: Secondary | ICD-10-CM | POA: Diagnosis not present

## 2015-01-28 DIAGNOSIS — N179 Acute kidney failure, unspecified: Secondary | ICD-10-CM

## 2015-01-28 DIAGNOSIS — Z7901 Long term (current) use of anticoagulants: Secondary | ICD-10-CM

## 2015-01-28 DIAGNOSIS — I482 Chronic atrial fibrillation, unspecified: Secondary | ICD-10-CM

## 2015-01-28 DIAGNOSIS — R339 Retention of urine, unspecified: Secondary | ICD-10-CM | POA: Diagnosis not present

## 2015-01-29 ENCOUNTER — Telehealth: Payer: Self-pay | Admitting: *Deleted

## 2015-01-29 NOTE — Telephone Encounter (Signed)
Called pt to set up TCM appt he was d/c 01/27/15, but son Montine Circle) stated they ended up sending him to [facility] instead. He is not sure how long he will be in rehab...Johny Chess

## 2015-01-30 ENCOUNTER — Non-Acute Institutional Stay (SKILLED_NURSING_FACILITY): Payer: Medicare Other | Admitting: Internal Medicine

## 2015-01-30 ENCOUNTER — Other Ambulatory Visit: Payer: Self-pay | Admitting: Internal Medicine

## 2015-01-30 DIAGNOSIS — K5900 Constipation, unspecified: Secondary | ICD-10-CM

## 2015-01-30 DIAGNOSIS — I482 Chronic atrial fibrillation, unspecified: Secondary | ICD-10-CM

## 2015-01-30 DIAGNOSIS — S2241XS Multiple fractures of ribs, right side, sequela: Secondary | ICD-10-CM | POA: Diagnosis not present

## 2015-01-30 DIAGNOSIS — R531 Weakness: Secondary | ICD-10-CM | POA: Diagnosis not present

## 2015-01-30 DIAGNOSIS — R339 Retention of urine, unspecified: Secondary | ICD-10-CM

## 2015-01-30 DIAGNOSIS — Z7901 Long term (current) use of anticoagulants: Secondary | ICD-10-CM

## 2015-01-30 DIAGNOSIS — M1A9XX0 Chronic gout, unspecified, without tophus (tophi): Secondary | ICD-10-CM

## 2015-01-30 DIAGNOSIS — I10 Essential (primary) hypertension: Secondary | ICD-10-CM

## 2015-01-30 DIAGNOSIS — N183 Chronic kidney disease, stage 3 (moderate): Secondary | ICD-10-CM | POA: Diagnosis not present

## 2015-01-30 NOTE — Progress Notes (Signed)
Patient ID: Nathan [name], male   DOB: Dec 16, 1940, 74 y.o.   MRN: [id number]     [facility]   PCP: [name], MD  Code Status: full code  Allergies  Allergen Reactions  . Tramadol Nausea And Vomiting    Chief Complaint  Patient presents with  . New Admit To SNF     HPI:  74 y.o. patient is here for short term rehabilitation post hospital admission from 01/20/15-01/27/15 post fall with generalized weakness and right sided chest pain. He was diagnosed to have acute on chronic renal failure and responded well to iv fluids. He had a foley placed and ct abdomen/pelvis showed 7 mm left ureteral calculus. He was seen by urology. His lisinopril was discontinued. He had multiple rib fractures for which he was given pain medication. He has PMH of HTN, afib, ckd, diverticulosis among others.   Review of Systems:  Constitutional: Negative for fever, chills, diaphoresis.  HENT: Negative for headache, congestion, nasal discharge Eyes: Negative for eye pain, blurred vision, double vision and discharge.  Respiratory: positive for some dyspnea. Negative for cough, wheezing.   Cardiovascular: Negative for chest pain, palpitations, leg swelling.  Gastrointestinal: Negative for heartburn, nausea, vomiting, abdominal pain. Had bowel movement 2 days back and this is normal for him Genitourinary: Negative for flank pain.  Musculoskeletal: Negative for back pain, falls Skin: Negative for itching, rash.  Neurological: positive for lightheadedness with sudden position change. Negative for tingling, focal weakness Psychiatric/Behavioral: Negative for depression   Past Medical History  Diagnosis Date  . Permanent atrial fibrillation ([place])   . Hypertension   . Chronic renal impairment   . DJD (degenerative joint disease)   . Diverticulosis   . Elevated PSA     Dr Junious Silk  . Infected prosthetic knee joint ([place])     h/o Group B streptococcal prosthetic knee  infection  . Cancer ([place])     skin  . PONV (postoperative nausea and vomiting)     no  problems recently  . H/O hiatal hernia    Past Surgical History  Procedure Laterality Date  . 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 Total hip arthroplasty Right 07  . Colonoscopy  2003  . Hiatal hernia repair      Dr Kaylyn Lim , laparoscopic  . Hernia repair    . Inguinal hernia repair Left 03/05/2013    Procedure: HERNIA REPAIR INGUINAL ADULT;  Surgeon: [name], MD;  Location: [facility];  Service: General;  Laterality: Left;  . Insertion of mesh Left 03/05/2013    Procedure: INSERTION OF MESH;  Surgeon: [name], MD;  Location: [facility];  Service: General;  Laterality: Left;   Social History:   reports that he quit smoking about 23 years ago. His smoking use included Cigarettes. He has a .75 pack-year smoking history. He has never used smokeless tobacco. He reports that he drinks about 1.2 oz of alcohol per week. He reports that he does not use illicit drugs.  Family History  Problem Relation Age of Onset  . Hypertension Mother   . Skin cancer Mother   . Brain cancer Sister   . Hypertension Brother   . Diabetes Brother   . Stroke Neg Hx   . Heart disease Neg Hx     Medications:   Medication List       This list is accurate as of: 01/30/15  9:47 AM.  Always use your most recent med  list.               acetaminophen 650 MG CR tablet  Commonly known as:  TYLENOL  Take 650 mg by mouth every 8 (eight) hours as needed for pain.     allopurinol 100 MG tablet  Commonly known as:  ZYLOPRIM  Take 1 tablet (100 mg total) by mouth daily.     diltiazem 360 MG 24 hr capsule  Commonly known as:  CARDIZEM CD  Take 1 capsule (360 mg total) by mouth daily.     finasteride 5 MG tablet  Commonly known as:  PROSCAR  Take 5 mg by mouth daily. Rx'ed by [name]     metoprolol 100 MG tablet  Commonly known as:  LOPRESSOR  TAKE 1 TABLET BY MOUTH TWICE A DAY     multivitamin  with minerals tablet  Take 1 tablet by mouth daily.     oxyCODONE 5 MG immediate release tablet  Commonly known as:  Oxy IR/ROXICODONE  Take 1 tablet (5 mg total) by mouth every 6 (six) hours as needed for moderate pain.     polyethylene glycol packet  Commonly known as:  MIRALAX / GLYCOLAX  Take 17 g by mouth 2 (two) times daily.     warfarin 5 MG tablet  Commonly known as:  COUMADIN  Monday Wednesday Friday Saturday Sunday-2.5 mg Tuesday/Thursday-5 mg         Physical Exam: Filed Vitals:   01/30/15 0946  BP: 125/73  Pulse: 60  Temp: 97.6 F (36.4 C)  Resp: 18  Weight: 233 lb 12.8 oz (106.051 kg)  SpO2: 99%    General- elderly male, in no acute distress Head- normocephalic, atraumatic Nose- normal nasal mucosa, no maxillary or frontal sinus tenderness, no nasal discharge Throat- moist mucus membrane Eyes- PERRLA, EOMI, no pallor, no icterus, no discharge, normal conjunctiva, normal sclera Neck- no cervical lymphadenopathy Cardiovascular- irregular heart rate, 1+ ankle edema, no chest wall tenderness Respiratory-poor air entry, no wheeze, no rhonchi, no crackles, no use of accessory muscles Abdomen- bowel sounds present, soft, non tender, foley catheter with dark concentrated urine Musculoskeletal- able to move all 4 extremities, generalized weakness and unsteady gait Neurological- no focal deficit, alert and oriented to person, place and time Skin- warm and dry, old surgical scar to right knee, some scraping noted on skin of right leg Psychiatry- normal mood and affect    Labs reviewed: Basic Metabolic Panel:  Recent Labs  01/24/15 0535 01/25/15 0711 01/27/15 0618  NA 141 138 141  K 4.2 4.3 5.0  CL 110 109 109  CO2 20* 21* 23  GLUCOSE 92 83 83  BUN 37* 32* 25*  CREATININE 1.95* 1.70* 1.41*  CALCIUM 8.1* 7.9* 8.4*   Liver Function Tests:  Recent Labs  01/20/15 1604 01/21/15 0645 01/27/15 0618  AST 40 20 26  ALT 33 24 42  ALKPHOS 77 62 79    BILITOT 0.7 0.9 0.7  PROT 6.9 5.5* 6.1*  ALBUMIN 2.8* 2.4* 1.9*   No results for input(s): LIPASE, AMYLASE in the last 8760 hours. No results for input(s): AMMONIA in the last 8760 hours. CBC:  Recent Labs  10/30/14 1608 01/20/15 1604 01/21/15 0645 01/25/15 0711 01/27/15 0618  WBC 8.3 12.6* 9.5 12.7* 10.0  NEUTROABS 6.0 11.0*  --   --   --   HGB 15.2 13.9 12.5* 11.9* 12.2*  HCT 45.9 42.2 38.2* 37.0* 38.1*  MCV 94.3 96.6 96.2 96.4 96.2  PLT 269.0 208 188  294 372   Cardiac Enzymes: No results for input(s): CKTOTAL, CKMB, CKMBINDEX, TROPONINI in the last 8760 hours. BNP: Invalid input(s): POCBNP CBG:  Recent Labs  01/23/15 0802 01/24/15 0623 01/25/15 0735  GLUCAP 86 88 94    Radiological Exams: Dg Ribs Unilateral W/chest Right  01/21/2015  CLINICAL DATA:  Pain.  Fall.  Initial evaluation. EXAM: RIGHT RIBS AND CHEST - 3+ VIEW COMPARISON:  02/25/2013. FINDINGS: Cardiomegaly low lung volumes with basilar atelectasis. Right posterior lateral seventh, eighth, ninth, and tenth rib fractures are noted. These are angulated and displaced. No pneumothorax. IMPRESSION: Right posterior lateral seventh, eighth, ninth, and tenth angulated and displaced rib fractures. No pneumothorax. Electronically Signed   By: Marcello Moores  Register   On: 01/21/2015 13:54   Dg Knee Complete 4 Views Right  01/20/2015  CLINICAL DATA:  Fall.  Pain.  Initial evaluation. EXAM: RIGHT KNEE - COMPLETE 4+ VIEW COMPARISON:  None. 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 Hardware intact. No acute bony abnormality. Soft tissue ossification most likely postsurgical . Loose bodies within the joint space. Peripheral vascular calcification. IMPRESSION: No acute abnormality. 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 Hardware intact. Electronically Signed   By: Marcello Moores  Register   On: 01/20/2015 15:58     Assessment/Plan  Generalized weakness Will have him work with physical  therapy and occupational therapy team to help with gait training and muscle strengthening exercises.fall precautions. Skin care. Encourage to be out of bed.   Urinary retention Has foley catheter in place. Hydration encouraged. Has f/u with urology this Monday. Continue finasteride 5 mg daily for now and continue foley care  Right ribs fracture No chest wall tenderness. Continue tylenol 650 mg q8h prn pain and oxyIR 5 mg q6h prn pain  ckd stage 3 Monitor renal function, hydration encouraged  HTN Stable at present, continue to monitor BP, continue metoprolol 100 mg bid  Afib Rate controlled. Continue cardizem cd 360 mg daily and metoprolol 100 mg bid  Chronic anticoagulation inr 2.4, currently on coumadin 2.5 and 5 mg daily, monitor inr goal 2-3  Gout Stable, continue allopurinol 100 mg daily  Constipation Continue miralax bid  Goals of care: short term rehabilitation   Labs/tests ordered: cbc, cmp, inr  Family/ staff Communication: reviewed care plan with patient and nursing supervisor    Blanchie Serve, MD  [facility] [phone] (Monday-Friday 8 am - 5 pm) [phone] (afterhours)

## 2015-02-19 ENCOUNTER — Non-Acute Institutional Stay (SKILLED_NURSING_FACILITY): Payer: Medicare Other | Admitting: Adult Health

## 2015-02-19 ENCOUNTER — Encounter: Payer: Self-pay | Admitting: Adult Health

## 2015-02-19 DIAGNOSIS — E43 Unspecified severe protein-calorie malnutrition: Secondary | ICD-10-CM

## 2015-02-19 DIAGNOSIS — R339 Retention of urine, unspecified: Secondary | ICD-10-CM | POA: Diagnosis not present

## 2015-02-19 DIAGNOSIS — I482 Chronic atrial fibrillation, unspecified: Secondary | ICD-10-CM

## 2015-02-19 DIAGNOSIS — R531 Weakness: Secondary | ICD-10-CM

## 2015-02-19 DIAGNOSIS — I10 Essential (primary) hypertension: Secondary | ICD-10-CM | POA: Diagnosis not present

## 2015-02-19 DIAGNOSIS — N183 Chronic kidney disease, stage 3 (moderate): Secondary | ICD-10-CM

## 2015-02-19 DIAGNOSIS — S2241XS Multiple fractures of ribs, right side, sequela: Secondary | ICD-10-CM | POA: Diagnosis not present

## 2015-02-19 DIAGNOSIS — M1A9XX0 Chronic gout, unspecified, without tophus (tophi): Secondary | ICD-10-CM

## 2015-02-28 ENCOUNTER — Other Ambulatory Visit: Payer: Self-pay | Admitting: Internal Medicine

## 2015-03-14 ENCOUNTER — Other Ambulatory Visit: Payer: Self-pay | Admitting: Internal Medicine

## 2015-03-18 ENCOUNTER — Other Ambulatory Visit: Payer: Self-pay | Admitting: Internal Medicine

## 2015-03-24 ENCOUNTER — Other Ambulatory Visit: Payer: Self-pay | Admitting: Urology

## 2015-03-24 DIAGNOSIS — N281 Cyst of kidney, acquired: Secondary | ICD-10-CM

## 2015-03-26 ENCOUNTER — Other Ambulatory Visit: Payer: Self-pay | Admitting: Internal Medicine

## 2015-03-31 ENCOUNTER — Ambulatory Visit (INDEPENDENT_AMBULATORY_CARE_PROVIDER_SITE_OTHER): Payer: Medicare Other | Admitting: Internal Medicine

## 2015-03-31 ENCOUNTER — Encounter: Payer: Self-pay | Admitting: Internal Medicine

## 2015-03-31 VITALS — BP 120/82 | HR 78 | Temp 97.7°F | Resp 16 | Ht 73.0 in | Wt 213.0 lb

## 2015-03-31 DIAGNOSIS — B029 Zoster without complications: Secondary | ICD-10-CM | POA: Diagnosis not present

## 2015-03-31 DIAGNOSIS — N183 Chronic kidney disease, stage 3 unspecified: Secondary | ICD-10-CM

## 2015-03-31 MED ORDER — VALACYCLOVIR HCL 1 G PO TABS: 1000.0000 mg | ORAL_TABLET | Freq: Two times a day (BID) | ORAL | Status: AC

## 2015-03-31 MED ORDER — OXYCODONE HCL 5 MG PO TABS: 5.0000 mg | ORAL_TABLET | ORAL | Status: DC | PRN

## 2015-03-31 NOTE — Progress Notes (Signed)
Subjective:  Patient ID: Nathan [name], male    DOB: 30-Mar-1941  Age: 74 y.o. MRN: [id number]  CC: Rash   HPI Nathan [name] presents for a 4 day history of painful rash down his left arm.  Outpatient Prescriptions Prior to Visit  Medication Sig Dispense Refill  . acetaminophen (TYLENOL) 650 MG CR tablet Take 650 mg by mouth every 8 (eight) hours as needed for pain.    Marland Kitchen allopurinol (ZYLOPRIM) 100 MG tablet TAKE 1 TABLET (100 MG TOTAL) BY MOUTH DAILY. 30 tablet 5  . diltiazem (CARDIZEM CD) 360 MG 24 hr capsule Take 1 capsule (360 mg total) by mouth daily. 30 capsule 0  . diltiazem (CARDIZEM CD) 360 MG 24 hr capsule TAKE ONE CAPSULE BY MOUTH EVERY DAY 30 capsule 1  . finasteride (PROSCAR) 5 MG tablet Take 5 mg by mouth daily. Rx'ed by [name]    . lisinopril (PRINIVIL,ZESTRIL) 40 MG tablet TAKE 1 TABLET (40 MG TOTAL) BY MOUTH DAILY. 90 tablet 1  . metoprolol (LOPRESSOR) 100 MG tablet TAKE 1 TABLET BY MOUTH TWICE A DAY 180 tablet 0  . Multiple Vitamins-Minerals (MULTIVITAMIN WITH MINERALS) tablet Take 1 tablet by mouth daily.    Marland Kitchen warfarin (COUMADIN) 5 MG tablet Monday Wednesday Friday Saturday Sunday-2.5 mg Tuesday/Thursday-5 mg 30 tablet 3  . oxyCODONE (OXY IR/ROXICODONE) 5 MG immediate release tablet Take 1 tablet (5 mg total) by mouth every 6 (six) hours as needed for moderate pain. 30 tablet 0  . polyethylene glycol (MIRALAX / GLYCOLAX) packet Take 17 g by mouth 2 (two) times daily. 14 each 0   No facility-administered medications prior to visit.    ROS Review of Systems  Constitutional: Negative.   HENT: Negative.  Negative for sore throat and voice change.   Eyes: Negative.   Respiratory: Negative.  Negative for cough, choking, chest tightness, shortness of breath and stridor.   Cardiovascular: Negative.  Negative for chest pain, palpitations and leg swelling.  Gastrointestinal: Negative.  Negative for nausea, vomiting, abdominal pain, diarrhea, constipation and blood  in stool.  Endocrine: Negative.   Genitourinary: Negative.   Musculoskeletal: Negative.  Negative for myalgias, back pain, joint swelling and arthralgias.  Skin: Positive for rash. Negative for color change.  Allergic/Immunologic: Negative.   Neurological: Negative.  Negative for dizziness.  Hematological: Negative.  Negative for adenopathy. Does not bruise/bleed easily.  Psychiatric/Behavioral: Negative.     Objective:  BP 120/82 mmHg  Pulse 78  Temp(Src) 97.7 F (36.5 C) (Oral)  Resp 16  Ht 6\' 1"  (1.854 m)  Wt 213 lb (96.616 kg)  BMI 28.11 kg/m2  SpO2 93%  BP Readings from Last 3 Encounters:  03/31/15 120/82  02/19/15 117/68  01/30/15 125/73    Wt Readings from Last 3 Encounters:  03/31/15 213 lb (96.616 kg)  02/19/15 233 lb 12.8 oz (106.051 kg)  01/30/15 233 lb 12.8 oz (106.051 kg)    Physical Exam  Constitutional: He is oriented to person, place, and time. No distress.  HENT:  Mouth/Throat: Oropharynx is clear and moist. No oropharyngeal exudate.  Eyes: Conjunctivae are normal. Right eye exhibits no discharge. Left eye exhibits no discharge. No scleral icterus.  Neck: Normal range of motion. Neck supple. No JVD present. No tracheal deviation present. No thyromegaly present.  Cardiovascular: Normal rate, regular rhythm, normal heart sounds and intact distal pulses.  Exam reveals no gallop and no friction rub.   No murmur heard. Pulmonary/Chest: Effort normal and breath sounds normal. No stridor.  No respiratory distress. He has no wheezes. He has no rales. He exhibits no tenderness.  Abdominal: Soft. Bowel sounds are normal. He exhibits no distension and no mass. There is no tenderness. There is no rebound and no guarding.  Musculoskeletal: Normal range of motion. He exhibits no edema or tenderness.  Lymphadenopathy:       Head (right side): No submental, no submandibular, no tonsillar, no preauricular, no posterior auricular and no occipital adenopathy present.        Head (left side): No submental, no submandibular, no tonsillar, no preauricular, no posterior auricular and no occipital adenopathy present.    He has no cervical adenopathy.    He has no axillary adenopathy.       Right: No supraclavicular adenopathy present.       Left: No supraclavicular adenopathy present.  Neurological: He is oriented to person, place, and time.  Skin: Rash noted. No abrasion, no bruising, no burn, no ecchymosis, no laceration, no lesion, no petechiae and no purpura noted. Rash is vesicular. Rash is not macular, not papular, not maculopapular, not nodular, not pustular and not urticarial. He is not diaphoretic. There is erythema.     Down the left arm there are large groups of vesicles and excoriations on an erythematous base  Psychiatric: He has a normal mood and affect. His behavior is normal. Judgment and thought content normal.    Lab Results  Component Value Date   WBC 10.0 01/27/2015   HGB 12.2* 01/27/2015   HCT 38.1* 01/27/2015   PLT 372 01/27/2015   GLUCOSE 83 01/27/2015   CHOL 103 01/21/2015   TRIG 92 01/21/2015   HDL 27* 01/21/2015   LDLCALC 58 01/21/2015   ALT 42 01/27/2015   AST 26 01/27/2015   NA 141 01/27/2015   K 5.0 01/27/2015   CL 109 01/27/2015   CREATININE 1.41* 01/27/2015   BUN 25* 01/27/2015   CO2 23 01/27/2015   TSH 1.98 10/30/2014   INR 2.43* 01/27/2015   HGBA1C 5.4 01/20/2015    Dg Ribs Unilateral W/chest Right  01/21/2015  CLINICAL DATA:  Pain.  Fall.  Initial evaluation. EXAM: RIGHT RIBS AND CHEST - 3+ VIEW COMPARISON:  02/25/2013. FINDINGS: Cardiomegaly low lung volumes with basilar atelectasis. Right posterior lateral seventh, eighth, ninth, and tenth rib fractures are noted. These are angulated and displaced. No pneumothorax. IMPRESSION: Right posterior lateral seventh, eighth, ninth, and tenth angulated and displaced rib fractures. No pneumothorax. Electronically Signed   By: Marcello Moores  Register   On: 01/21/2015 13:54   Dg Knee  Complete 4 Views Right  01/20/2015  CLINICAL DATA:  Fall.  Pain.  Initial evaluation. EXAM: RIGHT KNEE - COMPLETE 4+ VIEW COMPARISON:  None. 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 Hardware intact. No acute bony abnormality. Soft tissue ossification most likely postsurgical . Loose bodies within the joint space. Peripheral vascular calcification. IMPRESSION: No acute abnormality. 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 Hardware intact. Electronically Signed   By: Marcello Moores  Register   On: 01/20/2015 15:58    Assessment & Plan:   Ashlee was seen today for rash.  Diagnoses and all orders for this visit:  Kidney disease, chronic, stage III (GFR 30-59 ml/min)- his renal function is stable  Shingles- will treat the infection with renally dosed valacyclovir -     valACYclovir (VALTREX) 1000 MG tablet; Take 1 tablet (1,000 mg total) by mouth 2 (two) times daily. -     oxyCODONE (OXY IR/ROXICODONE) 5  MG immediate release tablet; Take 1 tablet (5 mg total) by mouth every 4 (four) hours as needed for severe pain.  I have discontinued Mr. Beal's oxyCODONE and polyethylene glycol. I am also having him start on valACYclovir and oxyCODONE. Additionally, I am having him maintain his finasteride, multivitamin with minerals, acetaminophen, diltiazem, warfarin, diltiazem, allopurinol, lisinopril, metoprolol, and furosemide.  Meds ordered this encounter  Medications  . furosemide (LASIX) 40 MG tablet    Sig: TAKE 1 TABLET (40 MG TOTAL) BY MOUTH DAILY.    Refill:  5  . valACYclovir (VALTREX) 1000 MG tablet    Sig: Take 1 tablet (1,000 mg total) by mouth 2 (two) times daily.    Dispense:  14 tablet    Refill:  1  . oxyCODONE (OXY IR/ROXICODONE) 5 MG immediate release tablet    Sig: Take 1 tablet (5 mg total) by mouth every 4 (four) hours as needed for severe pain.    Dispense:  65 tablet    Refill:  0     Follow-up: Return in about 3 weeks (around  04/21/2015).  Scarlette Calico, MD

## 2015-03-31 NOTE — Progress Notes (Signed)
Pre visit review using our clinic review tool, if applicable. No additional management support is needed unless otherwise documented below in the visit note. 

## 2015-03-31 NOTE — Patient Instructions (Signed)

## 2015-04-07 ENCOUNTER — Ambulatory Visit (INDEPENDENT_AMBULATORY_CARE_PROVIDER_SITE_OTHER): Payer: Medicare Other | Admitting: Pharmacist

## 2015-04-07 DIAGNOSIS — I482 Chronic atrial fibrillation, unspecified: Secondary | ICD-10-CM

## 2015-04-07 DIAGNOSIS — I4891 Unspecified atrial fibrillation: Secondary | ICD-10-CM

## 2015-04-07 DIAGNOSIS — Z7901 Long term (current) use of anticoagulants: Secondary | ICD-10-CM | POA: Diagnosis not present

## 2015-04-07 DIAGNOSIS — Z5181 Encounter for therapeutic drug level monitoring: Secondary | ICD-10-CM

## 2015-04-07 LAB — POCT INR: INR: 4.3

## 2015-04-21 ENCOUNTER — Ambulatory Visit (INDEPENDENT_AMBULATORY_CARE_PROVIDER_SITE_OTHER): Payer: Medicare Other | Admitting: *Deleted

## 2015-04-21 DIAGNOSIS — I4891 Unspecified atrial fibrillation: Secondary | ICD-10-CM

## 2015-04-21 DIAGNOSIS — I482 Chronic atrial fibrillation, unspecified: Secondary | ICD-10-CM

## 2015-04-21 DIAGNOSIS — Z7901 Long term (current) use of anticoagulants: Secondary | ICD-10-CM | POA: Diagnosis not present

## 2015-04-21 DIAGNOSIS — Z5181 Encounter for therapeutic drug level monitoring: Secondary | ICD-10-CM | POA: Diagnosis not present

## 2015-04-21 LAB — POCT INR: INR: 2.6

## 2015-05-05 ENCOUNTER — Other Ambulatory Visit: Payer: Self-pay | Admitting: Internal Medicine

## 2015-05-08 ENCOUNTER — Ambulatory Visit (HOSPITAL_COMMUNITY)
Admission: RE | Admit: 2015-05-08 | Discharge: 2015-05-08 | Disposition: A | Discharge status: Home / Self Care | Payer: Medicare Other | Source: Ambulatory Visit | Attending: Urology | Admitting: Urology

## 2015-05-08 DIAGNOSIS — D734 Cyst of spleen: Secondary | ICD-10-CM | POA: Insufficient documentation

## 2015-05-08 DIAGNOSIS — Z0189 Encounter for other specified special examinations: Secondary | ICD-10-CM | POA: Insufficient documentation

## 2015-05-08 DIAGNOSIS — K862 Cyst of pancreas: Secondary | ICD-10-CM | POA: Insufficient documentation

## 2015-05-08 DIAGNOSIS — N281 Cyst of kidney, acquired: Secondary | ICD-10-CM

## 2015-05-08 DIAGNOSIS — K7689 Other specified diseases of liver: Secondary | ICD-10-CM | POA: Insufficient documentation

## 2015-05-08 DIAGNOSIS — N2889 Other specified disorders of kidney and ureter: Secondary | ICD-10-CM | POA: Insufficient documentation

## 2015-05-08 LAB — POCT I-STAT CREATININE: Creatinine, Ser: 1.4 mg/dL — ABNORMAL HIGH (ref 0.61–1.24)

## 2015-05-08 MED ORDER — GADOBENATE DIMEGLUMINE 529 MG/ML IV SOLN
20.0000 mL | Freq: Once | INTRAVENOUS | Status: AC | PRN
  Administered 2015-05-08: 20 mL via INTRAVENOUS

## 2015-05-20 ENCOUNTER — Ambulatory Visit (INDEPENDENT_AMBULATORY_CARE_PROVIDER_SITE_OTHER): Payer: Medicare Other | Admitting: Internal Medicine

## 2015-05-20 ENCOUNTER — Encounter: Payer: Self-pay | Admitting: Internal Medicine

## 2015-05-20 ENCOUNTER — Ambulatory Visit (INDEPENDENT_AMBULATORY_CARE_PROVIDER_SITE_OTHER): Payer: Medicare Other | Admitting: Pharmacist

## 2015-05-20 VITALS — BP 112/80 | HR 48 | Ht 73.0 in | Wt 215.0 lb

## 2015-05-20 DIAGNOSIS — I10 Essential (primary) hypertension: Secondary | ICD-10-CM

## 2015-05-20 DIAGNOSIS — I482 Chronic atrial fibrillation, unspecified: Secondary | ICD-10-CM

## 2015-05-20 DIAGNOSIS — Z5181 Encounter for therapeutic drug level monitoring: Secondary | ICD-10-CM

## 2015-05-20 DIAGNOSIS — I4891 Unspecified atrial fibrillation: Secondary | ICD-10-CM

## 2015-05-20 DIAGNOSIS — Z7901 Long term (current) use of anticoagulants: Secondary | ICD-10-CM | POA: Diagnosis not present

## 2015-05-20 LAB — POCT INR: INR: 2.6

## 2015-05-20 MED ORDER — DILTIAZEM HCL ER COATED BEADS 120 MG PO CP24: 120.0000 mg | ORAL_CAPSULE | Freq: Every day | ORAL | Status: DC

## 2015-05-20 NOTE — Patient Instructions (Signed)
Medication Instructions:  Your physician has recommended you make the following change in your medication: 1) DECREASE Diltiazem CD to 120 mg daily  Labwork: None ordered  Testing/Procedures: Your physician has requested that you have an echocardiogram. Echocardiography is a painless test that uses sound waves to create images of your heart. It provides your doctor with information about the size and shape of your heart and how well your heart's chambers and valves are working. This procedure takes approximately one hour. There are no restrictions for this procedure.  Follow-Up: Your physician wants you to follow-up in: 6 months with Truitt Merle, NP.  You will receive a reminder letter in the mail two months in advance. If you don't receive a letter, please call our office to schedule the follow-up appointment.  If you need a refill on your cardiac medications before your next appointment, please call your pharmacy.  Thank you for choosing [facility]!!
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
## 2015-06-02 ENCOUNTER — Other Ambulatory Visit: Payer: Self-pay

## 2015-06-02 ENCOUNTER — Other Ambulatory Visit: Payer: Self-pay | Admitting: Internal Medicine

## 2015-06-02 NOTE — Telephone Encounter (Signed)
Error

## 2015-06-03 ENCOUNTER — Ambulatory Visit (HOSPITAL_COMMUNITY): Payer: Medicare Other | Attending: Cardiovascular Disease

## 2015-06-03 ENCOUNTER — Other Ambulatory Visit: Payer: Self-pay

## 2015-06-03 DIAGNOSIS — Z87891 Personal history of nicotine dependence: Secondary | ICD-10-CM | POA: Diagnosis not present

## 2015-06-03 DIAGNOSIS — E785 Hyperlipidemia, unspecified: Secondary | ICD-10-CM | POA: Insufficient documentation

## 2015-06-03 DIAGNOSIS — I119 Hypertensive heart disease without heart failure: Secondary | ICD-10-CM | POA: Diagnosis not present

## 2015-06-03 DIAGNOSIS — I351 Nonrheumatic aortic (valve) insufficiency: Secondary | ICD-10-CM | POA: Diagnosis not present

## 2015-06-03 DIAGNOSIS — I340 Nonrheumatic mitral (valve) insufficiency: Secondary | ICD-10-CM | POA: Diagnosis not present

## 2015-06-03 DIAGNOSIS — I4891 Unspecified atrial fibrillation: Secondary | ICD-10-CM | POA: Diagnosis present

## 2015-06-03 DIAGNOSIS — I482 Chronic atrial fibrillation, unspecified: Secondary | ICD-10-CM

## 2015-06-17 ENCOUNTER — Ambulatory Visit (INDEPENDENT_AMBULATORY_CARE_PROVIDER_SITE_OTHER): Payer: Medicare Other | Admitting: *Deleted

## 2015-06-17 DIAGNOSIS — I482 Chronic atrial fibrillation, unspecified: Secondary | ICD-10-CM

## 2015-06-17 DIAGNOSIS — Z7901 Long term (current) use of anticoagulants: Secondary | ICD-10-CM | POA: Diagnosis not present

## 2015-06-17 DIAGNOSIS — I4891 Unspecified atrial fibrillation: Secondary | ICD-10-CM

## 2015-06-17 DIAGNOSIS — Z5181 Encounter for therapeutic drug level monitoring: Secondary | ICD-10-CM

## 2015-06-17 LAB — POCT INR: INR: 3.1

## 2015-06-25 ENCOUNTER — Other Ambulatory Visit: Payer: Self-pay | Admitting: *Deleted

## 2015-06-25 MED ORDER — METOPROLOL TARTRATE 100 MG PO TABS: 100.0000 mg | ORAL_TABLET | Freq: Two times a day (BID) | ORAL | Status: DC

## 2015-06-27 ENCOUNTER — Other Ambulatory Visit: Payer: Self-pay | Admitting: Internal Medicine

## 2015-07-15 ENCOUNTER — Ambulatory Visit (INDEPENDENT_AMBULATORY_CARE_PROVIDER_SITE_OTHER): Payer: Medicare Other | Admitting: *Deleted

## 2015-07-15 DIAGNOSIS — Z7901 Long term (current) use of anticoagulants: Secondary | ICD-10-CM | POA: Diagnosis not present

## 2015-07-15 DIAGNOSIS — I482 Chronic atrial fibrillation, unspecified: Secondary | ICD-10-CM

## 2015-07-15 DIAGNOSIS — I4891 Unspecified atrial fibrillation: Secondary | ICD-10-CM | POA: Diagnosis not present

## 2015-07-15 DIAGNOSIS — Z5181 Encounter for therapeutic drug level monitoring: Secondary | ICD-10-CM

## 2015-07-15 LAB — POCT INR: INR: 2.2

## 2015-07-17 ENCOUNTER — Other Ambulatory Visit: Payer: Self-pay | Admitting: *Deleted

## 2015-07-17 MED ORDER — WARFARIN SODIUM 5 MG PO TABS: ORAL_TABLET | ORAL | Status: DC

## 2015-07-17 NOTE — Telephone Encounter (Signed)
Spoke with pt and refill sent to [facility] as requested

## 2015-08-12 ENCOUNTER — Ambulatory Visit (INDEPENDENT_AMBULATORY_CARE_PROVIDER_SITE_OTHER): Payer: Medicare Other | Admitting: *Deleted

## 2015-08-12 DIAGNOSIS — Z5181 Encounter for therapeutic drug level monitoring: Secondary | ICD-10-CM

## 2015-08-12 DIAGNOSIS — Z7901 Long term (current) use of anticoagulants: Secondary | ICD-10-CM

## 2015-08-12 DIAGNOSIS — I482 Chronic atrial fibrillation, unspecified: Secondary | ICD-10-CM

## 2015-08-12 DIAGNOSIS — I4891 Unspecified atrial fibrillation: Secondary | ICD-10-CM

## 2015-08-12 LAB — POCT INR: INR: 2.1

## 2015-08-16 NOTE — Progress Notes (Signed)
Patient ID: Nathan [name], male   DOB: [birth year], 75 y.o.   MRN: [id number]     DATE:    02/19/15  MRN:  [id number]  BIRTHDAY: 15-Feb-1941  Facility:  Nursing Home Location:  [place] Room Number: [id number]  LEVEL OF CARE:  SNF [phone])  Contact Information    Name Relation Home Work Mobile   [place] Son [phone]  [place] Son [phone]     [place] other [phone]         Code Status History    Date Active Date Inactive Code Status Order ID Comments User Context   01/20/2015  9:41 PM 01/28/2015  1:17 AM Full Code [id number]  Allyne [name], MD Inpatient       Chief Complaint  Patient presents with  . Discharge Note    HISTORY OF PRESENT ILLNESS:  This is a 75 year old male who is for discharge home with Home health PT for endurance and OT for ADLs.  He has been admitted to [facility] on 01/27/15 from [facility]. He has PMH of atrial fibrillation, on chronic anticoagulation, hypertension, diverticulosis and chronic kidney disease. He fell while trying to get out of the car. He sustained a knee injury but no fracture as a result. He complains of having generalized weakness. His creatinine was noted to be elevated-3.37, with baseline 1.4. 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 Renal ultrasound showed left-sided hydronephrosis. CT of abdomen and pelvis showed obstructing 7 mmHg ureteral calculus, bilateral renal lesions. He, also, had right sided pain after the fall.   Patient was admitted to this facility for short-term rehabilitation after the patient's recent hospitalization.  Patient has completed SNF rehabilitation and therapy has cleared the patient for discharge.  PAST MEDICAL HISTORY:  Past Medical History  Diagnosis Date  . Permanent atrial fibrillation ([place])   . Hypertension   . Chronic renal impairment   . DJD (degenerative joint  disease)   . Diverticulosis   . Elevated PSA     Dr Junious Silk  . Infected prosthetic knee joint ([place])     h/o Group B streptococcal prosthetic knee infection  . Cancer ([place])     skin  . PONV (postoperative nausea and vomiting)     no  problems recently  . H/O hiatal hernia      CURRENT MEDICATIONS: Reviewed  Patient's Medications  New Prescriptions   *OXYCODONE (OXY IR/ROXICODONE) 5 MG IMMEDIATE RELEASE TABLET    Take 1 tablet (5 mg total) by mouth every 4 (four) hours as needed for severe pain.  Previous Medications   *ACETAMINOPHEN (TYLENOL) 650 MG CR TABLET    Take 650 mg by mouth every 8 (eight) hours as needed for pain.          WARFARIN (COUMADIN) 5 MG TABLET    Take 5 mg 1 tablet by mouth daily; check INR on 02/20/15      DECUBIVITE    Take 1 tablet by mouth daily.  Modified Medications   Modified Medication Previous Medication   *ALLOPURINOL (ZYLOPRIM) 100 MG TABLET allopurinol (ZYLOPRIM) 100 MG tablet      TAKE 1 TABLET (100 MG TOTAL) BY MOUTH DAILY.    Take 1 tablet (100 mg total) by mouth daily.   *DILTIAZEM (CARDIZEM CD) 360 MG 24 HR CAPSULE diltiazem (CARDIZEM CD) 360 MG 24 hr capsule      Take 1 capsule (120  mg total) by mouth daily.    TAKE ONE CAPSULE BY MOUTH EVERY DAY    FINASTERIDE 5 mg       Take 5 mg 1 tablet by mouth daily      TAMSULOSIN 0.4 MG       Take 0.4 mg 1 capsule by mouth daily   *METOPROLOL (LOPRESSOR) 100 MG TABLET metoprolol (LOPRESSOR) 100 MG tablet      Take 1 tablet (100 mg total) by mouth 2 (two) times daily.    TAKE 1 TABLET BY MOUTH TWICE A DAY   METOPROLOL (LOPRESSOR) 100 MG TABLET     TAKE 1 TABLET BY MOUTH TWICE A DAY   POLYETHYLENE GLYCOL (MIRALAX / GLYCOLAX) PACKET         Take 17 g by mouth 2 (two) times daily.     BACTRIM DS      Take Bactrim DS 1 tablet by mouth twice a day   FLORASTOR 250 MG      Take Florastor 250 mg 1 capsule by mouth twice a day                  Allergies  Allergen Reactions  . Tramadol Nausea And  Vomiting     REVIEW OF SYSTEMS:  GENERAL: no change in appetite, no fatigue, no weight changes, no fever, chills or weakness EYES: Denies change in vision, dry eyes, eye pain, itching or discharge EARS: Denies change in hearing, ringing in ears, or earache NOSE: Denies nasal congestion or epistaxis MOUTH and THROAT: Denies oral discomfort, gingival pain or bleeding, pain from teeth or hoarseness   RESPIRATORY: no cough, SOB, DOE, wheezing, hemoptysis CARDIAC: no chest pain,  or palpitations, +edema GI: no abdominal pain, diarrhea, constipation, heart burn, nausea or vomiting GU: Denies dysuria, frequency, hematuria, incontinence, or discharge PSYCHIATRIC: Denies feeling of depression or anxiety. No report of hallucinations, insomnia, paranoia, or agitation     PHYSICAL EXAMINATION  GENERAL APPEARANCE: Well nourished. In no acute distress. Normal body habitus HEAD: Normal in size and contour. No evidence of trauma EYES: Lids open and close normally. No blepharitis, entropion or ectropion. PERRL. Conjunctivae are clear and sclerae are white. Lenses are without opacity EARS: Pinnae are normal. Patient hears normal voice tunes of the examiner MOUTH and THROAT: Lips are without lesions. Oral mucosa is moist and without lesions. Tongue is normal in shape, size, and color and without lesions NECK: supple, trachea midline, no neck masses, no thyroid tenderness, no thyromegaly LYMPHATICS: no LAN in the neck, no supraclavicular LAN RESPIRATORY: breathing is even & unlabored, BS CTAB CARDIAC: Irregularly irregular, no murmur,no extra heart sounds, BLE edema 2+ GI: abdomen soft, normal BS, no masses, no tenderness, no hepatomegaly, no splenomegaly EXTREMITIES:  Able to move X 4 extremities PSYCHIATRIC: Alert and oriented X 3. Affect and behavior are appropriate  LABS/RADIOLOGY: Labs reviewed: 02/10/15  sodium 141 potassium 4.7 glucose 77 BUN 31 creatinine 1.65 calcium 8.4 02/09/15  Urine  culture >100,000 CFU/ml Serratia marcescens 02/02/15   sodium 138 potassium 4.5 glucose 84 BUN 32 creatinine 2.04 calcium 8.0 total protein 6.1 albumin 2.64 total bilirubin 0.75 alkaline phosphatase 98 SGOT 12 SGPT 19 01/30/15  sodium 140 potassium 4.6 glucose 83 BUN 26 creatinine 1.94 calcium 8.0 Basic Metabolic Panel:  Recent Labs  01/24/15 0535 01/25/15 0711 01/27/15 0618   NA 141 138 141   K 4.2 4.3 5.0   CL 110 109 109   CO2 20* 21* 23   GLUCOSE 92  83 83   BUN 37* 32* 25*   CREATININE 1.95* 1.70* 1.41*   CALCIUM 8.1* 7.9* 8.4*    Liver Function Tests:  Recent Labs  01/20/15 1604 01/21/15 0645 01/27/15 0618  AST 40 20 26  ALT 33 24 42  ALKPHOS 77 62 79  BILITOT 0.7 0.9 0.7  PROT 6.9 5.5* 6.1*  ALBUMIN 2.8* 2.4* 1.9*   CBC:  Recent Labs  10/30/14 1608 01/20/15 1604 01/21/15 0645 01/25/15 0711 01/27/15 0618  WBC 8.3 12.6* 9.5 12.7* 10.0  NEUTROABS 6.0 11.0*  --   --   --   HGB 15.2 13.9 12.5* 11.9* 12.2*  HCT 45.9 42.2 38.2* 37.0* 38.1*  MCV 94.3 96.6 96.2 96.4 96.2  PLT 269.0 208 188 294 372   Lipid Panel:  Recent Labs  01/21/15 0645  HDL 27*    CBG:  Recent Labs  01/23/15 0802 01/24/15 0623 01/25/15 0735  GLUCAP 86 88 94      ASSESSMENT/PLAN:  Generalized weakness - for Home health PT, OT and Nursing  Chronic kidney disease, stage III - creatinine 1.41; re-check creatinine 1.65  Hypertension - continue Cardizem CD 360 mg by mouth daily and metoprolol 100 mg by mouth twice a day  Atrial fibrillation - rate controlled; continue Cardizem, metoprolol and Coumadin  Multiple right rib fractures -  encourage incentive spirometer; continue Tylenol 325 mg tab give 2 tabs = 650 mg Q 8 hours PRN and Oxycodone 5 mg 1 tab Q 6 hours PRN for pain  Protein calorie malnutrition, severe - albumin 1.9; continue Procel 2 scoops BID  Gout - continue Allopurinol 100 mg daily  Urinary retention - continue Finasteride 5 mg 1 tab daily and Flomax 0.4 mg  1 capsule daily     I have filled out patient's discharge paperwork and written prescriptions.  Patient will receive home health PT, OT and Nursing.  Total discharge time: Less than 30 minutes  Discharge time involved coordination of the discharge process with Education officer, museum, nursing staff and therapy department. Medical justification for home health services verified.   [facility], NP [employer] [phone]

## 2015-08-16 NOTE — Progress Notes (Signed)
Patient ID: Nathan [name], male   DOB: [birth year], 75 y.o.   MRN: [id number]    DATE:  01/28/15  MRN:  [id number]  BIRTHDAY: October 16, 1940  Facility:  Nursing Home Location:  [place] Room Number: [id number]  LEVEL OF CARE:  SNF [phone])  Contact Information    Name Relation Home Work Mobile   [place] Son [phone]  [place] Son [phone]     [place] other [phone]         Code Status History    Date Active Date Inactive Code Status Order ID Comments User Context   01/20/2015  9:41 PM 01/28/2015  1:17 AM Full Code [id number]  Allyne [name], MD Inpatient       Chief Complaint  Patient presents with  . Hospitalization Follow-up    HISTORY OF PRESENT ILLNESS:  This is a 75 year old male who has been admitted to [facility] on 01/27/15 from [facility]. He has PMH of atrial fibrillation, on chronic anticoagulation, hypertension, diverticulosis and chronic kidney disease. He fell while trying to get out of the car. He sustained a knee injury but no fracture as a result. He complains of having generalized weakness. His creatinine was noted to be elevated-3.37, with baseline 1.4. 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 Renal ultrasound showed left-sided hydronephrosis. CT of abdomen and pelvis showed obstructing 7 mmHg uretea calculus, bilateral renal lesions. He, also, had right sided pain after the fall. He was taking ACE inhibitor and diuretics.  He has been admitted for a short-term rehabilitation.  PAST MEDICAL HISTORY:  Past Medical History  Diagnosis Date  . Permanent atrial fibrillation ([place])   . Hypertension   . Chronic renal impairment   . DJD (degenerative joint disease)   . Diverticulosis   . Elevated PSA     Dr Junious Silk  . Infected prosthetic knee joint ([place])     h/o Group B streptococcal prosthetic knee infection  . Cancer ([place])    skin  . PONV (postoperative nausea and vomiting)     no  problems recently  . H/O hiatal hernia      CURRENT MEDICATIONS: Reviewed  Patient's Medications  New Prescriptions   *OXYCODONE (OXY IR/ROXICODONE) 5 MG IMMEDIATE RELEASE TABLET    Take 1 tablet (5 mg total) by mouth every 4 (four) hours as needed for severe pain.  Previous Medications   *ACETAMINOPHEN (TYLENOL) 650 MG CR TABLET    Take 650 mg by mouth every 8 (eight) hours as needed for pain.          WARFARIN (COUMADIN) 5 MG TABLET        Monday Wednesday Friday Saturday Sunday-2.5 mg Tuesday/Thursday-5 mg     MULTIPLE VITAMINS-MINERALS (MULTIVITAMIN WITH MINERALS) TABLET    Take 1 tablet by mouth daily.  Modified Medications   Modified Medication Previous Medication   *ALLOPURINOL (ZYLOPRIM) 100 MG TABLET allopurinol (ZYLOPRIM) 100 MG tablet      TAKE 1 TABLET (100 MG TOTAL) BY MOUTH DAILY.    Take 1 tablet (100 mg total) by mouth daily.   *DILTIAZEM (CARDIZEM CD) 360 MG 24 HR CAPSULE diltiazem (CARDIZEM CD) 360 MG 24 hr capsule      Take 1 capsule (120 mg total) by mouth daily.    TAKE ONE CAPSULE BY MOUTH EVERY DAY    FINASTERIDE 5 mg       Take 5 mg 1  tablet by mouth daily      TAKE 1 TABLET (40 MG TOTAL) BY MOUTH DAILY.    Take 1 tablet (40 mg total) by mouth daily.   *METOPROLOL (LOPRESSOR) 100 MG TABLET metoprolol (LOPRESSOR) 100 MG tablet      Take 1 tablet (100 mg total) by mouth 2 (two) times daily.    TAKE 1 TABLET BY MOUTH TWICE A DAY   METOPROLOL (LOPRESSOR) 100 MG TABLET     TAKE 1 TABLET BY MOUTH TWICE A DAY   POLYETHYLENE GLYCOL (MIRALAX / GLYCOLAX) PACKET         Take 17 g by mouth 2 (two) times daily.                                    Allergies  Allergen Reactions  . Tramadol Nausea And Vomiting     REVIEW OF SYSTEMS:  GENERAL: no change in appetite, no fatigue, no weight changes, no fever, chills or weakness EYES: Denies change in vision, dry eyes, eye pain, itching or discharge EARS:  Denies change in hearing, ringing in ears, or earache NOSE: Denies nasal congestion or epistaxis MOUTH and THROAT: Denies oral discomfort, gingival pain or bleeding, pain from teeth or hoarseness   RESPIRATORY: no cough, SOB, DOE, wheezing, hemoptysis CARDIAC: no chest pain,  or palpitations, +edema GI: no abdominal pain, diarrhea, constipation, heart burn, nausea or vomiting GU: Denies dysuria, frequency, hematuria, incontinence, or discharge PSYCHIATRIC: Denies feeling of depression or anxiety. No report of hallucinations, insomnia, paranoia, or agitation     PHYSICAL EXAMINATION  GENERAL APPEARANCE: Well nourished. In no acute distress. Normal body habitus SKIN:  Skin is warm and dry.  HEAD: Normal in size and contour. No evidence of trauma EYES: Lids open and close normally. No blepharitis, entropion or ectropion. PERRL. Conjunctivae are clear and sclerae are white. Lenses are without opacity EARS: Pinnae are normal. Patient hears normal voice tunes of the examiner MOUTH and THROAT: Lips are without lesions. Oral mucosa is moist and without lesions. Tongue is normal in shape, size, and color and without lesions NECK: supple, trachea midline, no neck masses, no thyroid tenderness, no thyromegaly LYMPHATICS: no LAN in the neck, no supraclavicular LAN RESPIRATORY: breathing is even & unlabored, BS CTAB CARDIAC: Irregularly irregular, no murmur,no extra heart sounds, BLE edema 2+ GI: abdomen soft, normal BS, no masses, no tenderness, no hepatomegaly, no splenomegaly EXTREMITIES:  Able to move X 4 extremities PSYCHIATRIC: Alert and oriented X 3. Affect and behavior are appropriate  LABS/RADIOLOGY: Labs reviewed: Basic Metabolic Panel:  Recent Labs  01/24/15 0535 01/25/15 0711 01/27/15 0618   NA 141 138 141   K 4.2 4.3 5.0   CL 110 109 109   CO2 20* 21* 23   GLUCOSE 92 83 83   BUN 37* 32* 25*   CREATININE 1.95* 1.70* 1.41*   CALCIUM 8.1* 7.9* 8.4*    Liver Function  Tests:  Recent Labs  01/20/15 1604 01/21/15 0645 01/27/15 0618  AST 40 20 26  ALT 33 24 42  ALKPHOS 77 62 79  BILITOT 0.7 0.9 0.7  PROT 6.9 5.5* 6.1*  ALBUMIN 2.8* 2.4* 1.9*   CBC:  Recent Labs  10/30/14 1608 01/20/15 1604 01/21/15 0645 01/25/15 0711 01/27/15 0618  WBC 8.3 12.6* 9.5 12.7* 10.0  NEUTROABS 6.0 11.0*  --   --   --   HGB 15.2 13.9 12.5* 11.9*  12.2*  HCT 45.9 42.2 38.2* 37.0* 38.1*  MCV 94.3 96.6 96.2 96.4 96.2  PLT 269.0 208 188 294 372   Lipid Panel:  Recent Labs  01/21/15 0645  HDL 27*    CBG:  Recent Labs  01/23/15 0802 01/24/15 0623 01/25/15 0735  GLUCAP 86 88 94      ASSESSMENT/PLAN:  Generalized weakness - for rehabilitation  Chronic kidney disease, stage III - creatinine 1.41; check CMP  Hypertension - continue Cardizem CD 360 mg by mouth daily and metoprolol 100 mg by mouth twice a day  Atrial fibrillation - rate controlled; continue Cardizem, metoprolol and Coumadin; check CBC  Long-term use of anticoagulant - INR 2.4; continue Coumadin 5 mg PO Q Tuesdays and Thursdays; Coumadin 2.5 mg Q M-W-F-Sat-Sun  Multiple right rib fractures -  encourage incentive spirometer; continue Tylenol 325 mg tab give 2 tabs = 650 mg Q 8 hours PRN and Oxycodone 5 mg 1 tab Q 6 hours PRN for pain  Protein calorie malnutrition, severe - albumin 1.9; RD consult  Gout - continue Allopurinol 100 mg daily  Urinary retention - continue Finasteride 5 mg 1 tab daily      Goals of care:  Short-term rehabilitation    [facility], [facility] [phone]

## 2015-09-03 DIAGNOSIS — L570 Actinic keratosis: Secondary | ICD-10-CM | POA: Diagnosis not present

## 2015-09-03 DIAGNOSIS — L820 Inflamed seborrheic keratosis: Secondary | ICD-10-CM | POA: Diagnosis not present

## 2015-09-03 DIAGNOSIS — Z85828 Personal history of other malignant neoplasm of skin: Secondary | ICD-10-CM | POA: Diagnosis not present

## 2015-09-03 DIAGNOSIS — D171 Benign lipomatous neoplasm of skin and subcutaneous tissue of trunk: Secondary | ICD-10-CM | POA: Diagnosis not present

## 2015-09-03 DIAGNOSIS — L821 Other seborrheic keratosis: Secondary | ICD-10-CM | POA: Diagnosis not present

## 2015-09-16 ENCOUNTER — Ambulatory Visit (INDEPENDENT_AMBULATORY_CARE_PROVIDER_SITE_OTHER): Payer: Medicare Other | Admitting: *Deleted

## 2015-09-16 DIAGNOSIS — I482 Chronic atrial fibrillation, unspecified: Secondary | ICD-10-CM

## 2015-09-16 DIAGNOSIS — Z7901 Long term (current) use of anticoagulants: Secondary | ICD-10-CM | POA: Diagnosis not present

## 2015-09-16 DIAGNOSIS — I4891 Unspecified atrial fibrillation: Secondary | ICD-10-CM

## 2015-09-16 DIAGNOSIS — Z5181 Encounter for therapeutic drug level monitoring: Secondary | ICD-10-CM | POA: Diagnosis not present

## 2015-09-16 LAB — POCT INR: INR: 3

## 2015-09-18 ENCOUNTER — Other Ambulatory Visit: Payer: Self-pay | Admitting: *Deleted

## 2015-09-18 MED ORDER — ALLOPURINOL 100 MG PO TABS: ORAL_TABLET | ORAL | Status: DC

## 2015-09-24 ENCOUNTER — Other Ambulatory Visit: Payer: Self-pay

## 2015-09-24 MED ORDER — LISINOPRIL 40 MG PO TABS: ORAL_TABLET | ORAL | Status: DC

## 2015-09-25 ENCOUNTER — Other Ambulatory Visit: Payer: Self-pay | Admitting: *Deleted

## 2015-09-25 MED ORDER — WARFARIN SODIUM 5 MG PO TABS: ORAL_TABLET | ORAL | Status: DC

## 2015-10-29 ENCOUNTER — Ambulatory Visit (INDEPENDENT_AMBULATORY_CARE_PROVIDER_SITE_OTHER): Payer: Medicare Other | Admitting: *Deleted

## 2015-10-29 DIAGNOSIS — I4891 Unspecified atrial fibrillation: Secondary | ICD-10-CM

## 2015-10-29 DIAGNOSIS — I482 Chronic atrial fibrillation, unspecified: Secondary | ICD-10-CM

## 2015-10-29 DIAGNOSIS — Z7901 Long term (current) use of anticoagulants: Secondary | ICD-10-CM

## 2015-10-29 DIAGNOSIS — Z5181 Encounter for therapeutic drug level monitoring: Secondary | ICD-10-CM

## 2015-10-29 LAB — POCT INR: INR: 2.1

## 2015-11-13 ENCOUNTER — Telehealth: Payer: Self-pay | Admitting: Emergency Medicine

## 2015-11-13 MED ORDER — FUROSEMIDE 40 MG PO TABS: ORAL_TABLET | ORAL | 5 refills | Status: DC

## 2015-11-13 NOTE — Telephone Encounter (Signed)
Pt needs a refill on furosemide (LASIX) 40 MG tablet. Pharmacy is [facility] [place]. Please follow up thanks.

## 2015-11-13 NOTE — Telephone Encounter (Signed)
rec'd call from pt he states he has been trying to get his furosemide filled x's 4 days. Apologize to the patient inform him will send refill to [facility]...Nathan [name]

## 2015-11-15 ENCOUNTER — Other Ambulatory Visit: Payer: Self-pay | Admitting: Internal Medicine

## 2015-11-24 ENCOUNTER — Encounter: Payer: Self-pay | Admitting: Nurse Practitioner

## 2015-11-24 ENCOUNTER — Ambulatory Visit (INDEPENDENT_AMBULATORY_CARE_PROVIDER_SITE_OTHER): Payer: Medicare Other | Admitting: *Deleted

## 2015-11-24 ENCOUNTER — Ambulatory Visit (INDEPENDENT_AMBULATORY_CARE_PROVIDER_SITE_OTHER): Payer: Medicare Other | Admitting: Nurse Practitioner

## 2015-11-24 VITALS — BP 112/90 | HR 64 | Ht 74.0 in | Wt 217.1 lb

## 2015-11-24 DIAGNOSIS — I482 Chronic atrial fibrillation, unspecified: Secondary | ICD-10-CM

## 2015-11-24 DIAGNOSIS — I10 Essential (primary) hypertension: Secondary | ICD-10-CM | POA: Diagnosis not present

## 2015-11-24 DIAGNOSIS — I4891 Unspecified atrial fibrillation: Secondary | ICD-10-CM

## 2015-11-24 DIAGNOSIS — Z7901 Long term (current) use of anticoagulants: Secondary | ICD-10-CM | POA: Diagnosis not present

## 2015-11-24 DIAGNOSIS — Z5181 Encounter for therapeutic drug level monitoring: Secondary | ICD-10-CM

## 2015-11-24 LAB — POCT INR: INR: 2.6

## 2015-11-24 NOTE — Patient Instructions (Addendum)
We will be checking the following labs today - BMET and CBC  We will get your INR checked today.   Medication Instructions:    Continue with your current medicines.     Testing/Procedures To Be Arranged:  N/A  Follow-Up:   See Dr. Rayann Heman in February    Other Special Instructions:   N/A    If you need a refill on your cardiac medications before your next appointment, please call your pharmacy.   Call the [place] office at [phone] if you have any questions, problems or concerns.

## 2015-11-24 NOTE — Progress Notes (Signed)
CARDIOLOGY OFFICE NOTE  Date:  11/24/2015    Nathan [name] Date of Birth: 03/22/1941 Medical Record [id number]  PCP:  Scarlette Calico, MD  Cardiologist:  Allred   Chief Complaint  Patient presents with  . Atrial Fibrillation    6 month check - seen for Nathan. Rayann [name]    History of Present Illness: Nathan [name] is a 75 y.o. male who presents today for a follow up visit. This is a 6 month check. Seen for Nathan. Rayann [name].   He has chronic atrial fib, HTN, CKD and DJD. He is on coumadin anticoagulation.   Last seen back in February and felt to be doing ok. CCB was cut back for a HR of 56 and his echo was updated.   Comes in today. Here with his girlfriend of many years. Doing ok. No real complaints. No chest pain. No awareness of his AF. Not short of breath. No falls/bleeding/excessive bruising. He is happy with how he is doing. His balance is bad and he is using a walker for ambulation.   Past Medical History:  Diagnosis Date  . Cancer ([place])    skin  . Chronic renal impairment   . Diverticulosis   . DJD (degenerative joint disease)   . Elevated PSA    Nathan Junious Silk  . H/O hiatal hernia   . Hypertension   . Infected prosthetic knee joint ([place])    h/o Group B streptococcal prosthetic knee infection  . Permanent atrial fibrillation ([place])   . PONV (postoperative nausea and vomiting)    no  problems recently    Past Surgical History:  Procedure Laterality Date  . COLONOSCOPY  2003  . HERNIA REPAIR    . HIATAL HERNIA REPAIR     Nathan Kaylyn Lim , laparoscopic  . INGUINAL HERNIA REPAIR Left 03/05/2013   Procedure: HERNIA REPAIR INGUINAL ADULT;  Surgeon: [name], MD;  Location: [place];  Service: General;  Laterality: Left;  . INSERTION OF MESH Left 03/05/2013   Procedure: INSERTION OF MESH;  Surgeon: [name], MD;  Location: [place];  Service: General;  Laterality: Left;  . TOTAL HIP ARTHROPLASTY Right 07  . TOTAL KNEE ARTHROPLASTY Right 07      Medications: Current Outpatient Prescriptions  Medication Sig Dispense Refill  . acetaminophen (TYLENOL) 650 MG CR tablet Take 650 mg by mouth every 8 (eight) hours as needed for pain.    Marland Kitchen allopurinol (ZYLOPRIM) 100 MG tablet TAKE 1 TABLET (100 MG TOTAL) BY MOUTH DAILY. 30 tablet 5  . diltiazem (CARDIZEM CD) 120 MG 24 hr capsule Take 1 capsule (120 mg total) by mouth daily. 30 capsule 6  . finasteride (PROSCAR) 5 MG tablet Take 5 mg by mouth daily. Rx'ed by Nathan.Eskridge    . furosemide (LASIX) 40 MG tablet TAKE 1 TABLET (40 MG TOTAL) BY MOUTH DAILY. 30 tablet 5  . lisinopril (PRINIVIL,ZESTRIL) 40 MG tablet TAKE 1 TABLET (40 MG TOTAL) BY MOUTH DAILY. 90 tablet 1  . metoprolol (LOPRESSOR) 100 MG tablet Take 1 tablet (100 mg total) by mouth 2 (two) times daily. 180 tablet 3  . Multiple Vitamins-Minerals (MULTIVITAMIN WITH MINERALS) tablet Take 1 tablet by mouth daily.    Marland Kitchen oxyCODONE (OXY IR/ROXICODONE) 5 MG immediate release tablet Take 1 tablet (5 mg total) by mouth every 4 (four) hours as needed for severe pain. 65 tablet 0  . warfarin (COUMADIN) 5 MG tablet Take as directed by coumadin clinic 30 tablet 3  No current facility-administered medications for this visit.     Allergies: Allergies  Allergen Reactions  . Tramadol Nausea And Vomiting    Social History: The patient  reports that he quit smoking about 24 years ago. His smoking use included Cigarettes. He has a 0.75 pack-year smoking history. He has never used smokeless tobacco. He reports that he drinks about 1.2 oz of alcohol per week . He reports that he does not use drugs.   Family History: The patient's family history includes Brain cancer in his sister; Diabetes in his brother; Hypertension in his brother and mother; Skin cancer in his mother.   Review of Systems: Please see the history of present illness.   Otherwise, the review of systems is positive for none.   All other systems are reviewed and negative.    Physical Exam: VS:  BP 112/90   Pulse 64   Ht 6\' 2"  (1.88 m)   Wt 217 lb 1.9 oz (98.5 kg)   BMI 27.88 kg/m  .  BMI Body mass index is 27.88 kg/m.  Wt Readings from Last 3 Encounters:  11/24/15 217 lb 1.9 oz (98.5 kg)  05/20/15 215 lb (97.5 kg)  03/31/15 213 lb (96.6 kg)    General: Pleasant. Well developed, well nourished and in no acute distress.   HEENT: Normal.  Neck: Supple, no JVD, carotid bruits, or masses noted.  Cardiac: Irregular irregular rhythm. His rate is ok. No edema.  Respiratory:  Lungs are clear to auscultation bilaterally with normal work of breathing.  GI: Soft and nontender.  MS: No deformity or atrophy. Gait and ROM intact.  Skin: Warm and dry. Color is normal.  Neuro:  Strength and sensation are intact and no gross focal deficits noted.  Psych: Alert, appropriate and with normal affect.   LABORATORY DATA:  EKG:  EKG is not ordered today.  Lab Results  Component Value Date   WBC 10.0 01/27/2015   HGB 12.2 (L) 01/27/2015   HCT 38.1 (L) 01/27/2015   PLT 372 01/27/2015   GLUCOSE 83 01/27/2015   CHOL 103 01/21/2015   TRIG 92 01/21/2015   HDL 27 (L) 01/21/2015   LDLCALC 58 01/21/2015   ALT 42 01/27/2015   AST 26 01/27/2015   NA 141 01/27/2015   K 5.0 01/27/2015   CL 109 01/27/2015   CREATININE 1.40 (H) 05/08/2015   BUN 25 (H) 01/27/2015   CO2 23 01/27/2015   TSH 1.98 10/30/2014   INR 2.1 10/29/2015   HGBA1C 5.4 01/20/2015    BNP (last 3 results) No results for input(s): BNP in the last 8760 hours.  ProBNP (last 3 results) No results for input(s): PROBNP in the last 8760 hours.   Other Studies Reviewed Today:  Echo Study Conclusions from 05/2015  - Left ventricle: The cavity size was normal. Wall thickness was   increased in a pattern of mild LVH. There was focal basal   hypertrophy. Systolic function was normal. The estimated ejection   fraction was in the range of 50% to 55%. - Aortic valve: There was mild regurgitation. -  Mitral valve: There was mild to moderate regurgitation. - Left atrium: The atrium was moderately dilated. - Right atrium: The atrium was mildly dilated. - Pulmonary arteries: Systolic pressure was mildly increased. PA   peak pressure: 32 mm Hg (S).  Assessment/Plan: 1. Chronic atrial fib - managed with rate control and anticoagulation  2. Chronic anticoagulation with Coumadin - checking today.   3. HTN - BP  fair. I have left him on his current regimen.   Current medicines are reviewed with the patient today.  The patient does not have concerns regarding medicines other than what has been noted above.  The following changes have been made:  See above.  Labs/ tests ordered today include:    Orders Placed This Encounter  Procedures  . Basic metabolic panel  . CBC     Disposition:   FU with Nathan. Rayann [name] in February of 2018.   Patient is agreeable to this plan and will call if any problems develop in the interim.   Signed: Burtis Junes, RN, ANP-C 11/24/2015 3:06 PM  [facility] [address] [place], [place]  [address] Phone: [phone] Fax: [phone]

## 2015-11-25 LAB — CBC
HCT: 44.7 % (ref 38.5–50.0)
Hemoglobin: 14.9 g/dL (ref 13.2–17.1)
MCH: 31.3 pg (ref 27.0–33.0)
MCHC: 33.3 g/dL (ref 32.0–36.0)
MCV: 93.9 fL (ref 80.0–100.0)
MPV: 10.8 fL (ref 7.5–12.5)
Platelets: 276 10*3/uL (ref 140–400)
RBC: 4.76 MIL/uL (ref 4.20–5.80)
RDW: 15 % (ref 11.0–15.0)
WBC: 8 10*3/uL (ref 3.8–10.8)

## 2015-11-25 LAB — BASIC METABOLIC PANEL
BUN: 27 mg/dL — ABNORMAL HIGH (ref 7–25)
CO2: 23 mmol/L (ref 20–31)
Calcium: 9.2 mg/dL (ref 8.6–10.3)
Chloride: 107 mmol/L (ref 98–110)
Creat: 1.38 mg/dL — ABNORMAL HIGH (ref 0.70–1.18)
Glucose, Bld: 80 mg/dL (ref 65–99)
Potassium: 4.2 mmol/L (ref 3.5–5.3)
Sodium: 144 mmol/L (ref 135–146)

## 2015-12-10 ENCOUNTER — Other Ambulatory Visit: Payer: Self-pay | Admitting: Internal Medicine

## 2015-12-10 ENCOUNTER — Other Ambulatory Visit: Payer: Self-pay | Admitting: *Deleted

## 2015-12-10 MED ORDER — DILTIAZEM HCL ER COATED BEADS 120 MG PO CP24: 120.0000 mg | ORAL_CAPSULE | Freq: Every day | ORAL | 11 refills | Status: DC

## 2016-01-05 ENCOUNTER — Ambulatory Visit (INDEPENDENT_AMBULATORY_CARE_PROVIDER_SITE_OTHER): Payer: Medicare Other | Admitting: *Deleted

## 2016-01-05 DIAGNOSIS — I4891 Unspecified atrial fibrillation: Secondary | ICD-10-CM | POA: Diagnosis not present

## 2016-01-05 DIAGNOSIS — Z5181 Encounter for therapeutic drug level monitoring: Secondary | ICD-10-CM | POA: Diagnosis not present

## 2016-01-05 DIAGNOSIS — Z7901 Long term (current) use of anticoagulants: Secondary | ICD-10-CM

## 2016-01-05 LAB — POCT INR: INR: 2.4

## 2016-02-10 ENCOUNTER — Other Ambulatory Visit: Payer: Self-pay | Admitting: Internal Medicine

## 2016-02-16 ENCOUNTER — Ambulatory Visit (INDEPENDENT_AMBULATORY_CARE_PROVIDER_SITE_OTHER): Payer: Medicare Other | Admitting: *Deleted

## 2016-02-16 DIAGNOSIS — Z5181 Encounter for therapeutic drug level monitoring: Secondary | ICD-10-CM | POA: Diagnosis not present

## 2016-02-16 DIAGNOSIS — I4891 Unspecified atrial fibrillation: Secondary | ICD-10-CM | POA: Diagnosis not present

## 2016-02-16 DIAGNOSIS — Z7901 Long term (current) use of anticoagulants: Secondary | ICD-10-CM

## 2016-02-16 LAB — POCT INR: INR: 1.9

## 2016-02-25 ENCOUNTER — Ambulatory Visit (INDEPENDENT_AMBULATORY_CARE_PROVIDER_SITE_OTHER): Payer: Medicare Other

## 2016-02-25 DIAGNOSIS — Z23 Encounter for immunization: Secondary | ICD-10-CM

## 2016-03-09 ENCOUNTER — Telehealth: Payer: Self-pay | Admitting: Internal Medicine

## 2016-03-14 ENCOUNTER — Other Ambulatory Visit: Payer: Self-pay | Admitting: Internal Medicine

## 2016-03-14 NOTE — Telephone Encounter (Signed)
Pt called in and would needs a refill on this med  [facility] on [address]

## 2016-03-15 NOTE — Telephone Encounter (Signed)
Refill has been denied. Pt needs an appt.

## 2016-03-16 ENCOUNTER — Encounter: Payer: Self-pay | Admitting: Internal Medicine

## 2016-03-16 ENCOUNTER — Ambulatory Visit (INDEPENDENT_AMBULATORY_CARE_PROVIDER_SITE_OTHER): Payer: Medicare Other | Admitting: Internal Medicine

## 2016-03-16 ENCOUNTER — Other Ambulatory Visit (INDEPENDENT_AMBULATORY_CARE_PROVIDER_SITE_OTHER): Payer: Medicare Other

## 2016-03-16 VITALS — BP 148/88 | HR 78 | Temp 97.8°F | Resp 16 | Ht 74.0 in | Wt 220.0 lb

## 2016-03-16 DIAGNOSIS — I482 Chronic atrial fibrillation: Secondary | ICD-10-CM

## 2016-03-16 DIAGNOSIS — N183 Chronic kidney disease, stage 3 unspecified: Secondary | ICD-10-CM

## 2016-03-16 DIAGNOSIS — M10022 Idiopathic gout, left elbow: Secondary | ICD-10-CM

## 2016-03-16 DIAGNOSIS — I10 Essential (primary) hypertension: Secondary | ICD-10-CM

## 2016-03-16 DIAGNOSIS — Z23 Encounter for immunization: Secondary | ICD-10-CM

## 2016-03-16 DIAGNOSIS — I4821 Permanent atrial fibrillation: Secondary | ICD-10-CM

## 2016-03-16 LAB — BASIC METABOLIC PANEL
BUN: 29 mg/dL — ABNORMAL HIGH (ref 6–23)
CO2: 29 mEq/L (ref 19–32)
Calcium: 9.3 mg/dL (ref 8.4–10.5)
Chloride: 107 mEq/L (ref 96–112)
Creatinine, Ser: 1.37 mg/dL (ref 0.40–1.50)
GFR: 53.42 mL/min — ABNORMAL LOW (ref >60.00)
Glucose, Bld: 89 mg/dL (ref 70–99)
Potassium: 3.8 mEq/L (ref 3.5–5.1)
Sodium: 145 mEq/L (ref 135–145)

## 2016-03-16 LAB — CBC WITH DIFFERENTIAL/PLATELET
Basophils Absolute: 0 10*3/uL (ref 0.0–0.1)
Basophils Relative: 0.4 % (ref 0.0–3.0)
Eosinophils Absolute: 0.4 10*3/uL (ref 0.0–0.7)
Eosinophils Relative: 5.2 % — ABNORMAL HIGH (ref 0.0–5.0)
HCT: 42.6 % (ref 39.0–52.0)
Hemoglobin: 14.3 g/dL (ref 13.0–17.0)
Lymphocytes Relative: 15.6 % (ref 12.0–46.0)
Lymphs Abs: 1.1 10*3/uL (ref 0.7–4.0)
MCHC: 33.5 g/dL (ref 30.0–36.0)
MCV: 94.2 fl (ref 78.0–100.0)
Monocytes Absolute: 0.7 10*3/uL (ref 0.1–1.0)
Monocytes Relative: 10.3 % (ref 3.0–12.0)
Neutro Abs: 4.9 10*3/uL (ref 1.4–7.7)
Neutrophils Relative %: 68.5 % (ref 43.0–77.0)
Platelets: 256 10*3/uL (ref 150.0–400.0)
RBC: 4.52 Mil/uL (ref 4.22–5.81)
RDW: 15.3 % (ref 11.5–15.5)
WBC: 7.2 10*3/uL (ref 4.0–10.5)

## 2016-03-16 LAB — URIC ACID: Uric Acid, Serum: 7.4 mg/dL (ref 4.0–7.8)

## 2016-03-16 MED ORDER — ALLOPURINOL 100 MG PO TABS
ORAL_TABLET | ORAL | 1 refills | Status: DC
Start: 2016-03-16 — End: 2016-09-05

## 2016-03-16 NOTE — Patient Instructions (Signed)
Hypertension Hypertension, commonly called high blood pressure, is when the force of blood pumping through your arteries is too strong. Your arteries are the blood vessels that carry blood from your heart throughout your body. A blood pressure reading consists of a higher number over a lower number, such as 110/72. The higher number (systolic) is the pressure inside your arteries when your heart pumps. The lower number (diastolic) is the pressure inside your arteries when your heart relaxes. Ideally you want your blood pressure below 120/80. Hypertension forces your heart to work harder to pump blood. Your arteries may become narrow or stiff. Having untreated or uncontrolled hypertension can cause heart attack, stroke, kidney disease, and other problems. What increases the risk? Some risk factors for high blood pressure are controllable. Others are not. Risk factors you cannot control include:  Race. You may be at higher risk if you are African American.  Age. Risk increases with age.  Gender. Men are at higher risk than women before age 45 years. After age 65, women are at higher risk than men. Risk factors you can control include:  Not getting enough exercise or physical activity.  Being overweight.  Getting too much fat, sugar, calories, or salt in your diet.  Drinking too much alcohol. What are the signs or symptoms? Hypertension does not usually cause signs or symptoms. Extremely high blood pressure (hypertensive crisis) may cause headache, anxiety, shortness of breath, and nosebleed. How is this diagnosed? To check if you have hypertension, your health care provider will measure your blood pressure while you are seated, with your arm held at the level of your heart. It should be measured at least twice using the same arm. Certain conditions can cause a difference in blood pressure between your right and left arms. A blood pressure reading that is higher than normal on one occasion does  not mean that you need treatment. If it is not clear whether you have high blood pressure, you may be asked to return on a different day to have your blood pressure checked again. Or, you may be asked to monitor your blood pressure at home for 1 or more weeks. How is this treated? Treating high blood pressure includes making lifestyle changes and possibly taking medicine. Living a healthy lifestyle can help lower high blood pressure. You may need to change some of your habits. Lifestyle changes may include:  Following the DASH diet. This diet is high in fruits, vegetables, and whole grains. It is low in salt, red meat, and added sugars.  Keep your sodium intake below 2,300 mg per day.  Getting at least 30-45 minutes of aerobic exercise at least 4 times per week.  Losing weight if necessary.  Not smoking.  Limiting alcoholic beverages.  Learning ways to reduce stress. Your health care provider may prescribe medicine if lifestyle changes are not enough to get your blood pressure under control, and if one of the following is true:  You are 18-59 years of age and your systolic blood pressure is above 140.  You are 60 years of age or older, and your systolic blood pressure is above 150.  Your diastolic blood pressure is above 90.  You have diabetes, and your systolic blood pressure is over 140 or your diastolic blood pressure is over 90.  You have kidney disease and your blood pressure is above 140/90.  You have heart disease and your blood pressure is above 140/90. Your personal target blood pressure may vary depending on your medical   conditions, your age, and other factors. Follow these instructions at home:  Have your blood pressure rechecked as directed by your health care provider.  Take medicines only as directed by your health care provider. Follow the directions carefully. Blood pressure medicines must be taken as prescribed. The medicine does not work as well when you skip  doses. Skipping doses also puts you at risk for problems.  Do not smoke.  Monitor your blood pressure at home as directed by your health care provider. Contact a health care provider if:  You think you are having a reaction to medicines taken.  You have recurrent headaches or feel dizzy.  You have swelling in your ankles.  You have trouble with your vision. Get help right away if:  You develop a severe headache or confusion.  You have unusual weakness, numbness, or feel faint.  You have severe chest or abdominal pain.  You vomit repeatedly.  You have trouble breathing. This information is not intended to replace advice given to you by your health care provider. Make sure you discuss any questions you have with your health care provider. Document Released: 04/04/2005 Document Revised: 09/10/2015 Document Reviewed: 01/25/2013 Elsevier Interactive Patient Education  2017 Elsevier Inc.  

## 2016-03-16 NOTE — Progress Notes (Signed)
Subjective:  Patient ID: Nathan [name], male    DOB: Sep 13, 1940  Age: 75 y.o. MRN: [id number]  CC: Hypertension   HPI Nathan [name] [id number] presents for A blood pressure check and follow-up on gout. He has had no recent episodes of joint pain or swelling. He feels like his blood pressure HAs been well controlled as he has had no episodes of headache/blurred vision/chest pain/shortness of breath/palpitations/edema/fatigue.  Outpatient Medications Prior to Visit  Medication Sig Dispense Refill  . acetaminophen (TYLENOL) 650 MG CR tablet Take 650 mg by mouth every 8 (eight) hours as needed for pain.    . finasteride (PROSCAR) 5 MG tablet Take 5 mg by mouth daily. Rx'ed by [name]    . furosemide (LASIX) 40 MG tablet TAKE 1 TABLET (40 MG TOTAL) BY MOUTH DAILY. 30 tablet 5  . lisinopril (PRINIVIL,ZESTRIL) 40 MG tablet TAKE 1 TABLET (40 MG TOTAL) BY MOUTH DAILY. 90 tablet 1  . metoprolol (LOPRESSOR) 100 MG tablet Take 1 tablet (100 mg total) by mouth 2 (two) times daily. 180 tablet 3  . warfarin (COUMADIN) 5 MG tablet Take as directed by coumadin clinic 30 tablet 3  . allopurinol (ZYLOPRIM) 100 MG tablet TAKE 1 TABLET (100 MG TOTAL) BY MOUTH DAILY. 30 tablet 5  . Multiple Vitamins-Minerals (MULTIVITAMIN WITH MINERALS) tablet Take 1 tablet by mouth daily.    Marland Kitchen oxyCODONE (OXY IR/ROXICODONE) 5 MG immediate release tablet Take 1 tablet (5 mg total) by mouth every 4 (four) hours as needed for severe pain. 65 tablet 0  . diltiazem (CARDIZEM CD) 120 MG 24 hr capsule Take 1 capsule (120 mg total) by mouth daily. 30 capsule 11   No facility-administered medications prior to visit.     ROS Review of Systems  Constitutional: Negative for appetite change, diaphoresis, fatigue and unexpected weight change.  HENT: Negative.  Negative for trouble swallowing.   Eyes: Negative for visual disturbance.  Respiratory: Negative for cough, chest tightness, shortness of breath and stridor.   Cardiovascular:  Negative for chest pain, palpitations and leg swelling.  Gastrointestinal: Negative.  Negative for abdominal pain, constipation, diarrhea, nausea and vomiting.  Endocrine: Negative.   Genitourinary: Negative.  Negative for difficulty urinating.  Musculoskeletal: Negative for arthralgias, back pain, myalgias and neck pain.  Skin: Negative.  Negative for color change and rash.  Allergic/Immunologic: Negative.   Neurological: Negative.  Negative for dizziness, tremors, weakness and light-headedness.  Hematological: Negative.  Negative for adenopathy. Does not bruise/bleed easily.  Psychiatric/Behavioral: Negative.     Objective:  BP (!) 148/88 (BP Location: Left Arm, Patient Position: Sitting, Cuff Size: Normal)   Pulse 78   Temp 97.8 F (36.6 C) (Oral)   Resp 16   Ht 6\' 2"  (1.88 m)   Wt 220 lb (99.8 kg)   SpO2 94%   BMI 28.25 kg/m   BP Readings from Last 3 Encounters:  03/16/16 (!) 148/88  11/24/15 112/90  05/20/15 112/80    Wt Readings from Last 3 Encounters:  03/16/16 220 lb (99.8 kg)  11/24/15 217 lb 1.9 oz (98.5 kg)  05/20/15 215 lb (97.5 kg)    Physical Exam  Constitutional: He is oriented to person, place, and time. No distress.  HENT:  Mouth/Throat: Oropharynx is clear and moist. No oropharyngeal exudate.  Eyes: Conjunctivae are normal. Right eye exhibits no discharge. Left eye exhibits no discharge. No scleral icterus.  Neck: Normal range of motion. Neck supple. No JVD present. No tracheal deviation present. No thyromegaly present.  Cardiovascular: Normal rate.  An irregularly irregular rhythm present. Exam reveals no gallop, no S3, no S4 and no friction rub.   No murmur heard. Pulmonary/Chest: Effort normal and breath sounds normal. No stridor. No respiratory distress. He has no wheezes. He has no rales. He exhibits no tenderness.  Abdominal: Soft. Bowel sounds are normal. He exhibits no distension and no mass. There is no tenderness. There is no rebound and no  guarding.  Musculoskeletal: Normal range of motion. He exhibits edema (trace pitting edema in BLE). He exhibits no tenderness or deformity.  Lymphadenopathy:    He has no cervical adenopathy.  Neurological: He is oriented to person, place, and time.  Skin: Skin is warm and dry. No rash noted. He is not diaphoretic. No erythema. No pallor.  Vitals reviewed.   Lab Results  Component Value Date   WBC 7.2 03/16/2016   HGB 14.3 03/16/2016   HCT 42.6 03/16/2016   PLT 256.0 03/16/2016   GLUCOSE 89 03/16/2016   CHOL 103 01/21/2015   TRIG 92 01/21/2015   HDL 27 (L) 01/21/2015   LDLCALC 58 01/21/2015   ALT 42 01/27/2015   AST 26 01/27/2015   NA 145 03/16/2016   K 3.8 03/16/2016   CL 107 03/16/2016   CREATININE 1.37 03/16/2016   BUN 29 (H) 03/16/2016   CO2 29 03/16/2016   TSH 1.98 10/30/2014   INR 1.9 02/16/2016   HGBA1C 5.4 01/20/2015    Mr Abdomen W Wo Contrast  Result Date: 05/08/2015 CLINICAL DATA:  Evaluate renal lesions. EXAM: MRI ABDOMEN WITHOUT AND WITH CONTRAST TECHNIQUE: Multiplanar multisequence MR imaging of the abdomen was performed both before and after the administration of intravenous contrast. CONTRAST:  33mL MULTIHANCE GADOBENATE DIMEGLUMINE 529 MG/ML IV SOLN COMPARISON:  01/22/2015 FINDINGS: Lower chest: The lung bases are grossly clear. No definite pulmonary lesions. No pleural effusion. No pericardial effusion. Moderate-sized hiatal hernia is noted. Hepatobiliary: Numerous benign hepatic cysts. No worrisome hepatic lesions or intrahepatic biliary dilatation. The gallbladder is normal. No common bile duct dilatation. Pancreas: 13.5 mm cyst in the pancreatic head. No all enhancement. No inflammation or ductal dilatation. Spleen: Benign-appearing cysts in the splenic hilum. Other small cysts are noted. No worrisome splenic lesions. Adrenals/Urinary Tract: The adrenal glands are normal. Numerous bilateral renal cysts. Most of these are Bosniak 1 lesions. There is a exophytic  midpole lower pole junction region cyst which demonstrates bright T1 signal intensity consistent with hemorrhage. There is also a smaller more inferior cyst which has layering blood. A very small lower pole right renal cyst is also hemorrhagic. Multiple hemorrhagic and simple cyst associated with the left kidney. No worrisome enhancing renal lesions to suggest neoplasm. No significant collecting system abnormalities. Stomach/Bowel: The stomach, duodenum, small bowel and colon are grossly normal. Vascular/Lymphatic: No mesenteric or retroperitoneal mass or adenopathy. There is marked tortuosity of the abdominal aorta. Other: No ascites or abdominal wall hernia. Musculoskeletal: No significant osseous findings. Left lumbar scoliosis and moderate degenerative lumbar spondylosis. IMPRESSION: 1. Numerous simple and hemorrhagic renal cysts. No worrisome renal lesions. 2. Numerous benign hepatic cysts. 3. 13.5 mm pancreatic head cyst without worrisome MR imaging features. Recommend followup MR examination in 1 year to document stability. 4. Benign splenic cysts. 5. No mass or adenopathy. Electronically Signed   By: Marijo Sanes M.D.   On: 05/08/2015 17:51    Assessment & Plan:   Chaison was seen today for hypertension.  Diagnoses and all orders for this visit:  Essential hypertension- His  blood pressure is well controlled, electrolytes and renal function are stable. -     CBC with Differential/Platelet; Future  Idiopathic gout of left elbow, unspecified chronicity- he has not achieved the uric acid goal of less than 6 but he is also not having any episodes of gout attacks over continue allopurinol the current dose. -     Uric acid; Future -     allopurinol (ZYLOPRIM) 100 MG tablet; TAKE 1 TABLET (100 MG TOTAL) BY MOUTH DAILY.  Kidney disease, chronic, stage III (GFR 30-59 ml/min)- his renal function is stable, he will avoid nephrotoxic agents. -     CBC with Differential/Platelet; Future -     Basic  metabolic panel; Future  Permanent atrial fibrillation ([place])- he has good rate control, will continue anticoagulation with Coumadin  Need for prophylactic vaccination against Streptococcus pneumoniae (pneumococcus) -     Pneumococcal conjugate vaccine 13-valent   I have discontinued Mr. Destin's multivitamin with minerals and oxyCODONE. I am also having him maintain his finasteride, acetaminophen, metoprolol, lisinopril, furosemide, diltiazem, warfarin, and allopurinol.  Meds ordered this encounter  Medications  . allopurinol (ZYLOPRIM) 100 MG tablet    Sig: TAKE 1 TABLET (100 MG TOTAL) BY MOUTH DAILY.    Dispense:  90 tablet    Refill:  1     Follow-up: Return in about 6 months (around 09/13/2016).  Scarlette Calico, MD

## 2016-03-16 NOTE — Progress Notes (Signed)
Pre visit review using our clinic review tool, if applicable. No additional management support is needed unless otherwise documented below in the visit note. 

## 2016-03-18 ENCOUNTER — Other Ambulatory Visit: Payer: Self-pay | Admitting: Internal Medicine

## 2016-03-19 ENCOUNTER — Other Ambulatory Visit: Payer: Self-pay | Admitting: Internal Medicine

## 2016-04-01 ENCOUNTER — Ambulatory Visit (INDEPENDENT_AMBULATORY_CARE_PROVIDER_SITE_OTHER): Payer: Medicare Other | Admitting: General Practice

## 2016-04-01 DIAGNOSIS — Z7901 Long term (current) use of anticoagulants: Secondary | ICD-10-CM

## 2016-04-01 DIAGNOSIS — I4891 Unspecified atrial fibrillation: Secondary | ICD-10-CM

## 2016-04-01 LAB — POCT INR: INR: 3.2

## 2016-04-01 NOTE — Patient Instructions (Signed)
Pre visit review using our clinic review tool, if applicable. No additional management support is needed unless otherwise documented below in the visit note. 

## 2016-04-03 NOTE — Progress Notes (Signed)
I have reviewed and agree with the plan. 

## 2016-05-08 ENCOUNTER — Other Ambulatory Visit: Payer: Self-pay | Admitting: Internal Medicine

## 2016-05-11 ENCOUNTER — Other Ambulatory Visit: Payer: Self-pay | Admitting: Internal Medicine

## 2016-05-13 ENCOUNTER — Ambulatory Visit: Payer: Self-pay

## 2016-05-13 ENCOUNTER — Ambulatory Visit (INDEPENDENT_AMBULATORY_CARE_PROVIDER_SITE_OTHER): Payer: Medicare Other | Admitting: General Practice

## 2016-05-13 DIAGNOSIS — Z5181 Encounter for therapeutic drug level monitoring: Secondary | ICD-10-CM

## 2016-05-13 DIAGNOSIS — I4891 Unspecified atrial fibrillation: Secondary | ICD-10-CM

## 2016-05-13 LAB — POCT INR: INR: 2.5

## 2016-05-13 NOTE — Patient Instructions (Signed)
Pre visit review using our clinic review tool, if applicable. No additional management support is needed unless otherwise documented below in the visit note. 

## 2016-06-08 ENCOUNTER — Other Ambulatory Visit: Payer: Self-pay | Admitting: Internal Medicine

## 2016-06-24 ENCOUNTER — Ambulatory Visit (INDEPENDENT_AMBULATORY_CARE_PROVIDER_SITE_OTHER): Payer: Medicare Other | Admitting: General Practice

## 2016-06-24 DIAGNOSIS — Z5181 Encounter for therapeutic drug level monitoring: Secondary | ICD-10-CM

## 2016-06-24 DIAGNOSIS — I4891 Unspecified atrial fibrillation: Secondary | ICD-10-CM

## 2016-06-24 LAB — POCT INR: INR: 2.5

## 2016-06-24 NOTE — Progress Notes (Signed)
I have reviewed and agree with the plan. 

## 2016-06-24 NOTE — Patient Instructions (Signed)
Pre visit review using our clinic review tool, if applicable. No additional management support is needed unless otherwise documented below in the visit note. 

## 2016-06-27 ENCOUNTER — Other Ambulatory Visit: Payer: Self-pay | Admitting: Internal Medicine

## 2016-07-13 ENCOUNTER — Other Ambulatory Visit: Payer: Self-pay | Admitting: Internal Medicine

## 2016-07-25 DIAGNOSIS — N400 Enlarged prostate without lower urinary tract symptoms: Secondary | ICD-10-CM | POA: Diagnosis not present

## 2016-07-25 DIAGNOSIS — R972 Elevated prostate specific antigen [PSA]: Secondary | ICD-10-CM | POA: Diagnosis not present

## 2016-07-25 DIAGNOSIS — N281 Cyst of kidney, acquired: Secondary | ICD-10-CM | POA: Diagnosis not present

## 2016-07-26 ENCOUNTER — Other Ambulatory Visit: Payer: Self-pay | Admitting: Urology

## 2016-07-26 DIAGNOSIS — N281 Cyst of kidney, acquired: Secondary | ICD-10-CM

## 2016-07-26 DIAGNOSIS — K862 Cyst of pancreas: Secondary | ICD-10-CM

## 2016-08-03 ENCOUNTER — Other Ambulatory Visit: Payer: Self-pay | Admitting: Urology

## 2016-08-03 ENCOUNTER — Ambulatory Visit (HOSPITAL_COMMUNITY)
Admission: RE | Admit: 2016-08-03 | Discharge: 2016-08-03 | Disposition: A | Discharge status: Home / Self Care | Payer: Medicare Other | Source: Ambulatory Visit | Attending: Urology | Admitting: Urology

## 2016-08-03 DIAGNOSIS — N281 Cyst of kidney, acquired: Secondary | ICD-10-CM

## 2016-08-03 DIAGNOSIS — K862 Cyst of pancreas: Secondary | ICD-10-CM

## 2016-08-03 DIAGNOSIS — D734 Cyst of spleen: Secondary | ICD-10-CM | POA: Diagnosis not present

## 2016-08-03 DIAGNOSIS — R935 Abnormal findings on diagnostic imaging of other abdominal regions, including retroperitoneum: Secondary | ICD-10-CM | POA: Diagnosis not present

## 2016-08-03 LAB — POCT I-STAT CREATININE: Creatinine, Ser: 1.5 mg/dL — ABNORMAL HIGH (ref 0.61–1.24)

## 2016-08-03 MED ORDER — GADOBENATE DIMEGLUMINE 529 MG/ML IV SOLN
20.0000 mL | Freq: Once | INTRAVENOUS | Status: AC | PRN
  Administered 2016-08-03: 20 mL via INTRAVENOUS

## 2016-08-05 ENCOUNTER — Ambulatory Visit (INDEPENDENT_AMBULATORY_CARE_PROVIDER_SITE_OTHER): Payer: Medicare Other | Admitting: General Practice

## 2016-08-05 DIAGNOSIS — Z7901 Long term (current) use of anticoagulants: Secondary | ICD-10-CM | POA: Diagnosis not present

## 2016-08-05 DIAGNOSIS — Z5181 Encounter for therapeutic drug level monitoring: Secondary | ICD-10-CM | POA: Diagnosis not present

## 2016-08-05 DIAGNOSIS — I4891 Unspecified atrial fibrillation: Secondary | ICD-10-CM | POA: Diagnosis not present

## 2016-08-05 LAB — POCT INR: INR: 3.9

## 2016-08-05 NOTE — Progress Notes (Signed)
I have reviewed and agree with the plan. 

## 2016-08-05 NOTE — Patient Instructions (Signed)
Pre visit review using our clinic review tool, if applicable. No additional management support is needed unless otherwise documented below in the visit note. 

## 2016-09-02 ENCOUNTER — Ambulatory Visit: Payer: Medicare Other

## 2016-09-05 ENCOUNTER — Other Ambulatory Visit: Payer: Self-pay | Admitting: Internal Medicine

## 2016-09-05 DIAGNOSIS — M10022 Idiopathic gout, left elbow: Secondary | ICD-10-CM

## 2016-09-07 ENCOUNTER — Ambulatory Visit (INDEPENDENT_AMBULATORY_CARE_PROVIDER_SITE_OTHER): Payer: Medicare Other | Admitting: Internal Medicine

## 2016-09-07 ENCOUNTER — Encounter: Payer: Self-pay | Admitting: Internal Medicine

## 2016-09-07 VITALS — BP 128/82 | HR 62 | Temp 97.7°F | Ht 74.0 in | Wt 222.8 lb

## 2016-09-07 DIAGNOSIS — T148XXA Other injury of unspecified body region, initial encounter: Secondary | ICD-10-CM | POA: Diagnosis not present

## 2016-09-07 DIAGNOSIS — L301 Dyshidrosis [pompholyx]: Secondary | ICD-10-CM | POA: Insufficient documentation

## 2016-09-07 DIAGNOSIS — L089 Local infection of the skin and subcutaneous tissue, unspecified: Secondary | ICD-10-CM | POA: Insufficient documentation

## 2016-09-07 MED ORDER — SULFAMETHOXAZOLE-TRIMETHOPRIM 800-160 MG PO TABS: 1.0000 | ORAL_TABLET | Freq: Two times a day (BID) | ORAL | 0 refills | Status: DC

## 2016-09-07 MED ORDER — EPICERAM EX EMUL: 1.0000 | Freq: Two times a day (BID) | CUTANEOUS | 11 refills | Status: DC

## 2016-09-07 NOTE — Progress Notes (Signed)
Subjective:  Patient ID: Nathan [name], male    DOB: 01/21/1941  Age: 76 y.o. MRN: [id number]  CC: Rash   HPI Nathan [name] presents for 2 concerns - he cut his LLE several days ago and now complains that there is persistent purulent drainage from the area. He also complains of dry/itchy patches on his left upper back and RLE.  Outpatient Medications Prior to Visit  Medication Sig Dispense Refill  . acetaminophen (TYLENOL) 650 MG CR tablet Take 650 mg by mouth every 8 (eight) hours as needed for pain.    Marland Kitchen allopurinol (ZYLOPRIM) 100 MG tablet take 1 tablet by mouth once daily 90 tablet 1  . finasteride (PROSCAR) 5 MG tablet Take 5 mg by mouth daily. Rx'ed by [name]    . furosemide (LASIX) 40 MG tablet take 1 tablet by mouth once daily 30 tablet 5  . lisinopril (PRINIVIL,ZESTRIL) 40 MG tablet take 1 tablet by mouth once daily 90 tablet 1  . metoprolol (LOPRESSOR) 100 MG tablet take 1 tablet by mouth twice a day 60 tablet 3  . warfarin (COUMADIN) 5 MG tablet Take as directed by [facility]madin Clinic 30 tablet 3  . furosemide (LASIX) 40 MG tablet take 1 tablet by mouth once daily 30 tablet 5  . lisinopril (PRINIVIL,ZESTRIL) 40 MG tablet take 1 tablet by mouth once daily 90 tablet 1  . diltiazem (CARDIZEM CD) 120 MG 24 hr capsule Take 1 capsule (120 mg total) by mouth daily. 30 capsule 11   No facility-administered medications prior to visit.     ROS Review of Systems  Constitutional: Negative for chills, diaphoresis, fatigue and fever.  HENT: Negative.   Eyes: Negative for visual disturbance.  Respiratory: Negative for chest tightness, shortness of breath and wheezing.   Cardiovascular: Negative for chest pain, palpitations and leg swelling.  Gastrointestinal: Negative for abdominal pain, diarrhea, nausea and vomiting.  Endocrine: Negative.   Genitourinary: Negative.  Negative for difficulty urinating.  Musculoskeletal: Negative.  Negative for back pain and myalgias.  Skin:  Positive for rash and wound. Negative for color change and pallor.  Allergic/Immunologic: Negative.   Neurological: Negative.  Negative for dizziness, weakness and headaches.  Hematological: Negative for adenopathy. Does not bruise/bleed easily.  Psychiatric/Behavioral: Negative.     Objective:  BP 128/82 (BP Location: Left Arm, Patient Position: Sitting, Cuff Size: Large)   Pulse 62   Temp 97.7 F (36.5 C) (Oral)   Ht 6\' 2"  (1.88 m)   Wt 222 lb 12 oz (101 kg)   SpO2 98%   BMI 28.60 kg/m   BP Readings from Last 3 Encounters:  09/07/16 128/82  03/16/16 (!) 148/88  11/24/15 112/90    Wt Readings from Last 3 Encounters:  09/07/16 222 lb 12 oz (101 kg)  03/16/16 220 lb (99.8 kg)  11/24/15 217 lb 1.9 oz (98.5 kg)    Physical Exam  Constitutional: No distress.  HENT:  Mouth/Throat: Oropharynx is clear and moist.  Eyes: Conjunctivae are normal.  Cardiovascular: Normal rate, regular rhythm and intact distal pulses.  Exam reveals no friction rub.   Pulmonary/Chest: Effort normal.  Skin: Skin is warm and dry. Rash noted. No ecchymosis and no purpura noted. Rash is papular. Rash is not macular, not nodular, not pustular and not vesicular. He is not diaphoretic.     Vitals reviewed.   Lab Results  Component Value Date   WBC 7.2 03/16/2016   HGB 14.3 03/16/2016   HCT 42.6 03/16/2016  PLT 256.0 03/16/2016   GLUCOSE 89 03/16/2016   CHOL 103 01/21/2015   TRIG 92 01/21/2015   HDL 27 (L) 01/21/2015   LDLCALC 58 01/21/2015   ALT 42 01/27/2015   AST 26 01/27/2015   NA 145 03/16/2016   K 3.8 03/16/2016   CL 107 03/16/2016   CREATININE 1.50 (H) 08/03/2016   BUN 29 (H) 03/16/2016   CO2 29 03/16/2016   TSH 1.98 10/30/2014   INR 4.2 09/09/2016   HGBA1C 5.4 01/20/2015    Mr Abdomen Wwo Contrast  Result Date: 08/03/2016 CLINICAL DATA:  Annual follow-up for known renal and pancreatic cysts EXAM: MRI ABDOMEN WITHOUT AND WITH CONTRAST TECHNIQUE: Multiplanar multisequence MR  imaging of the abdomen was performed both before and after the administration of intravenous contrast. CONTRAST:  6mL MULTIHANCE GADOBENATE DIMEGLUMINE 529 MG/ML IV SOLN COMPARISON:  MRI abdomen dated 05/08/2015. CT abdomen/ pelvis dated 01/22/2015. FINDINGS: Lower chest: Lung bases are clear.  Cardiomegaly. Hepatobiliary: Multiple hepatic cysts, measuring up to 3.3 cm in segment 2 (series 7/ image 13). No suspicious/enhancing hepatic lesions. Gallbladder is unremarkable. No intrahepatic or extrahepatic ductal dilatation. Pancreas: 13 mm nonenhancing unilocular cyst in the pancreatic head (series 7/ image 27). No associated main pancreatic ductal dilatation. No pancreatic atrophy. Spleen: Splenic cysts, including a dominant 5.2 cm cyst in the splenic hilum (series 7/ image 25), without enhancement. Adrenals/Urinary Tract:  Adrenal glands are within normal limits. Multiple bilateral renal cysts of varying sizes and complexities, many of which are hemorrhagic, including a dominant 10.7 cm exophytic hemorrhagic cyst along the right upper kidney (series 1000/ image 53). None of these cysts demonstrate enhancement following contrast administration. No hydronephrosis. Stomach/Bowel: Stomach is notable for a small hiatal hernia. Visualized bowel is unremarkable. Vascular/Lymphatic: No evidence of abdominal aortic aneurysm. Mild ectasia of the infrarenal abdominal aorta measuring up to 3.0 cm (series 1004/ image 81). No suspicious abdominal lymphadenopathy. Other:  No abdominal ascites. Musculoskeletal: No focal osseous lesions. IMPRESSION: 13 mm nonenhancing cyst in the pancreatic head, favored to reflect a pseudocyst or side branch IPMN, unchanged. Given the patient's current age, at least one additional follow-up study is suggested in 2 years. This recommendation follows ACR consensus guidelines: Management of Incidental Pancreatic Cysts: A White Paper of the ACR Incidental Findings Committee. [place]  7829;56:213-086. Multiple bilateral simple and hemorrhagic renal cysts, without enhancement, benign (Bosniak I-II). Multiple hepatic and splenic cysts, benign. Additional ancillary findings as above. Electronically Signed   By: Julian Hy M.D.   On: 08/03/2016 17:14   Mr 3d Recon At Scanner  Result Date: 08/03/2016 CLINICAL DATA:  Annual follow-up for known renal and pancreatic cysts EXAM: MRI ABDOMEN WITHOUT AND WITH CONTRAST TECHNIQUE: Multiplanar multisequence MR imaging of the abdomen was performed both before and after the administration of intravenous contrast. CONTRAST:  70mL MULTIHANCE GADOBENATE DIMEGLUMINE 529 MG/ML IV SOLN COMPARISON:  MRI abdomen dated 05/08/2015. CT abdomen/ pelvis dated 01/22/2015. FINDINGS: Lower chest: Lung bases are clear.  Cardiomegaly. Hepatobiliary: Multiple hepatic cysts, measuring up to 3.3 cm in segment 2 (series 7/ image 13). No suspicious/enhancing hepatic lesions. Gallbladder is unremarkable. No intrahepatic or extrahepatic ductal dilatation. Pancreas: 13 mm nonenhancing unilocular cyst in the pancreatic head (series 7/ image 27). No associated main pancreatic ductal dilatation. No pancreatic atrophy. Spleen: Splenic cysts, including a dominant 5.2 cm cyst in the splenic hilum (series 7/ image 25), without enhancement. Adrenals/Urinary Tract:  Adrenal glands are within normal limits. Multiple bilateral renal cysts of varying sizes  and complexities, many of which are hemorrhagic, including a dominant 10.7 cm exophytic hemorrhagic cyst along the right upper kidney (series 1000/ image 53). None of these cysts demonstrate enhancement following contrast administration. No hydronephrosis. Stomach/Bowel: Stomach is notable for a small hiatal hernia. Visualized bowel is unremarkable. Vascular/Lymphatic: No evidence of abdominal aortic aneurysm. Mild ectasia of the infrarenal abdominal aorta measuring up to 3.0 cm (series 1004/ image 81). No suspicious abdominal  lymphadenopathy. Other:  No abdominal ascites. Musculoskeletal: No focal osseous lesions. IMPRESSION: 13 mm nonenhancing cyst in the pancreatic head, favored to reflect a pseudocyst or side branch IPMN, unchanged. Given the patient's current age, at least one additional follow-up study is suggested in 2 years. This recommendation follows ACR consensus guidelines: Management of Incidental Pancreatic Cysts: A White Paper of the ACR Incidental Findings Committee. [place] 1884;16:606-301. Multiple bilateral simple and hemorrhagic renal cysts, without enhancement, benign (Bosniak I-II). Multiple hepatic and splenic cysts, benign. Additional ancillary findings as above. Electronically Signed   By: Julian Hy M.D.   On: 08/03/2016 17:14    Assessment & Plan:   Derrel was seen today for rash.  Diagnoses and all orders for this visit:  Wound infection, posttraumatic- This is suspicious for staph infection so will start Bactrim DS -     Discontinue: sulfamethoxazole-trimethoprim (BACTRIM DS,SEPTRA DS) 800-160 MG tablet; Take 1 tablet by mouth 2 (two) times daily.  Eczema, dyshidrotic -     Discontinue: Dermatological Products, Misc. [facility]) lotion; Apply 1 Act topically 2 (two) times daily with a meal.   I am having Mr. Caccavale maintain his finasteride, acetaminophen, diltiazem, lisinopril, furosemide, warfarin, metoprolol tartrate, and allopurinol.  Meds ordered this encounter  Medications  . DISCONTD: Dermatological Products, Misc. [facility]) lotion    Sig: Apply 1 Act topically 2 (two) times daily with a meal.    Dispense:  100 g    Refill:  11  . DISCONTD: sulfamethoxazole-trimethoprim (BACTRIM DS,SEPTRA DS) 800-160 MG tablet    Sig: Take 1 tablet by mouth 2 (two) times daily.    Dispense:  14 tablet    Refill:  0     Follow-up: Return if symptoms worsen or fail to improve.  Scarlette Calico, MD

## 2016-09-07 NOTE — Patient Instructions (Signed)

## 2016-09-08 ENCOUNTER — Telehealth: Payer: Self-pay | Admitting: Internal Medicine

## 2016-09-08 DIAGNOSIS — L301 Dyshidrosis [pompholyx]: Secondary | ICD-10-CM

## 2016-09-08 DIAGNOSIS — T148XXA Other injury of unspecified body region, initial encounter: Secondary | ICD-10-CM

## 2016-09-08 DIAGNOSIS — L089 Local infection of the skin and subcutaneous tissue, unspecified: Secondary | ICD-10-CM

## 2016-09-08 MED ORDER — EPICERAM EX EMUL: 1.0000 | Freq: Two times a day (BID) | CUTANEOUS | 11 refills | Status: DC

## 2016-09-08 MED ORDER — SULFAMETHOXAZOLE-TRIMETHOPRIM 800-160 MG PO TABS: 1.0000 | ORAL_TABLET | Freq: Two times a day (BID) | ORAL | 0 refills | Status: AC

## 2016-09-08 NOTE — Telephone Encounter (Signed)
Pt does not use [facility] pharmacy please please remove from Pts chart.   Please resend Dermatological Products, Misc. [facility]) lotion and sulfamethoxazole-trimethoprim (BACTRIM DS,SEPTRA DS) 800-160 MG tablet To [facility] on [address]

## 2016-09-08 NOTE — Telephone Encounter (Signed)
rx sent as requested.   Pharmacy's were updated at visit yesterday

## 2016-09-09 ENCOUNTER — Ambulatory Visit (INDEPENDENT_AMBULATORY_CARE_PROVIDER_SITE_OTHER): Payer: Medicare Other | Admitting: General Practice

## 2016-09-09 DIAGNOSIS — Z5181 Encounter for therapeutic drug level monitoring: Secondary | ICD-10-CM | POA: Diagnosis not present

## 2016-09-09 DIAGNOSIS — I4891 Unspecified atrial fibrillation: Secondary | ICD-10-CM

## 2016-09-09 LAB — POCT INR: INR: 4.2

## 2016-09-09 NOTE — Patient Instructions (Addendum)
Pre visit review using our clinic review tool, if applicable. No additional management support is needed unless otherwise documented below in the visit note.  Patient will take 1/2 tablet through 6/4 due to taking Bactrim for 1 week.

## 2016-09-20 ENCOUNTER — Ambulatory Visit (INDEPENDENT_AMBULATORY_CARE_PROVIDER_SITE_OTHER): Payer: Medicare Other | Admitting: General Practice

## 2016-09-20 DIAGNOSIS — I4891 Unspecified atrial fibrillation: Secondary | ICD-10-CM

## 2016-09-20 DIAGNOSIS — Z5181 Encounter for therapeutic drug level monitoring: Secondary | ICD-10-CM | POA: Diagnosis not present

## 2016-09-20 LAB — POCT INR: INR: 2.5

## 2016-09-20 NOTE — Patient Instructions (Signed)
Pre visit review using our clinic review tool, if applicable. No additional management support is needed unless otherwise documented below in the visit note. 

## 2016-09-20 NOTE — Progress Notes (Signed)
I have reviewed and agree with the plan. 

## 2016-10-21 ENCOUNTER — Ambulatory Visit (INDEPENDENT_AMBULATORY_CARE_PROVIDER_SITE_OTHER): Payer: Medicare Other | Admitting: General Practice

## 2016-10-21 ENCOUNTER — Ambulatory Visit: Payer: Medicare Other

## 2016-10-21 DIAGNOSIS — Z5181 Encounter for therapeutic drug level monitoring: Secondary | ICD-10-CM

## 2016-10-21 DIAGNOSIS — I4891 Unspecified atrial fibrillation: Secondary | ICD-10-CM

## 2016-10-21 LAB — POCT INR: INR: 4

## 2016-10-21 NOTE — Patient Instructions (Signed)
Pre visit review using our clinic review tool, if applicable. No additional management support is needed unless otherwise documented below in the visit note. 

## 2016-10-21 NOTE — Progress Notes (Signed)
I have reviewed and agree with the plan. 

## 2016-11-05 ENCOUNTER — Other Ambulatory Visit: Payer: Self-pay | Admitting: Internal Medicine

## 2016-11-11 ENCOUNTER — Ambulatory Visit (INDEPENDENT_AMBULATORY_CARE_PROVIDER_SITE_OTHER): Payer: Medicare Other | Admitting: General Practice

## 2016-11-11 DIAGNOSIS — I4891 Unspecified atrial fibrillation: Secondary | ICD-10-CM | POA: Diagnosis not present

## 2016-11-11 DIAGNOSIS — Z5181 Encounter for therapeutic drug level monitoring: Secondary | ICD-10-CM | POA: Diagnosis not present

## 2016-11-11 LAB — POCT INR: INR: 4

## 2016-11-11 NOTE — Progress Notes (Signed)
I have reviewed and agree with the plan. 

## 2016-11-11 NOTE — Patient Instructions (Addendum)
Pre visit review using our clinic review tool, if applicable. No additional management support is needed unless otherwise documented below in the visit note. Pre visit review using our clinic review tool, if applicable. No additional management support is needed unless otherwise documented below in the visit note. 

## 2016-12-04 ENCOUNTER — Other Ambulatory Visit: Payer: Self-pay | Admitting: Internal Medicine

## 2016-12-06 ENCOUNTER — Other Ambulatory Visit: Payer: Self-pay | Admitting: Internal Medicine

## 2016-12-06 NOTE — Telephone Encounter (Signed)
Medication Detail    Disp Refills Start End   diltiazem (CARDIZEM CD) 120 MG 24 hr capsule 30 capsule 0 12/05/2016    Sig: take 1 capsule by mouth once daily   Sent to pharmacy as: diltiazem (CARDIZEM CD) 120 MG 24 hr capsule   Notes to Pharmacy: Patient needs appointment for further refills.   E-Prescribing Status: Receipt confirmed by pharmacy (12/05/2016 3:33 PM EDT)   Pharmacy   [facility], [place]

## 2016-12-09 ENCOUNTER — Ambulatory Visit (INDEPENDENT_AMBULATORY_CARE_PROVIDER_SITE_OTHER): Payer: Medicare Other | Admitting: General Practice

## 2016-12-09 DIAGNOSIS — I4891 Unspecified atrial fibrillation: Secondary | ICD-10-CM

## 2016-12-09 DIAGNOSIS — Z5181 Encounter for therapeutic drug level monitoring: Secondary | ICD-10-CM | POA: Diagnosis not present

## 2016-12-09 LAB — POCT INR: INR: 2.2

## 2016-12-09 NOTE — Patient Instructions (Signed)
Pre visit review using our clinic review tool, if applicable. No additional management support is needed unless otherwise documented below in the visit note. 

## 2016-12-12 ENCOUNTER — Other Ambulatory Visit: Payer: Self-pay | Admitting: Internal Medicine

## 2016-12-12 ENCOUNTER — Other Ambulatory Visit: Payer: Self-pay | Admitting: General Practice

## 2016-12-12 MED ORDER — WARFARIN SODIUM 5 MG PO TABS: ORAL_TABLET | ORAL | 3 refills | Status: DC

## 2016-12-22 ENCOUNTER — Other Ambulatory Visit: Payer: Self-pay | Admitting: Internal Medicine

## 2017-01-04 ENCOUNTER — Other Ambulatory Visit: Payer: Self-pay | Admitting: Internal Medicine

## 2017-01-06 ENCOUNTER — Ambulatory Visit (INDEPENDENT_AMBULATORY_CARE_PROVIDER_SITE_OTHER): Payer: Medicare Other | Admitting: General Practice

## 2017-01-06 DIAGNOSIS — Z5181 Encounter for therapeutic drug level monitoring: Secondary | ICD-10-CM | POA: Diagnosis not present

## 2017-01-06 DIAGNOSIS — Z7901 Long term (current) use of anticoagulants: Secondary | ICD-10-CM

## 2017-01-06 DIAGNOSIS — Z23 Encounter for immunization: Secondary | ICD-10-CM | POA: Diagnosis not present

## 2017-01-06 DIAGNOSIS — I4891 Unspecified atrial fibrillation: Secondary | ICD-10-CM

## 2017-01-06 LAB — POCT INR: INR: 3.8

## 2017-01-06 NOTE — Progress Notes (Signed)
I have reviewed and agree with the plan. 

## 2017-01-06 NOTE — Patient Instructions (Signed)
Pre visit review using our clinic review tool, if applicable. No additional management support is needed unless otherwise documented below in the visit note. 

## 2017-01-22 ENCOUNTER — Other Ambulatory Visit: Payer: Self-pay | Admitting: Internal Medicine

## 2017-01-23 ENCOUNTER — Ambulatory Visit (INDEPENDENT_AMBULATORY_CARE_PROVIDER_SITE_OTHER): Payer: Medicare Other | Admitting: Internal Medicine

## 2017-01-23 ENCOUNTER — Encounter: Payer: Self-pay | Admitting: Internal Medicine

## 2017-01-23 VITALS — BP 138/80 | HR 98 | Ht 73.0 in | Wt 215.4 lb

## 2017-01-23 DIAGNOSIS — I10 Essential (primary) hypertension: Secondary | ICD-10-CM

## 2017-01-23 DIAGNOSIS — I4891 Unspecified atrial fibrillation: Secondary | ICD-10-CM | POA: Diagnosis not present

## 2017-01-23 MED ORDER — METOPROLOL TARTRATE 100 MG PO TABS: 100.0000 mg | ORAL_TABLET | Freq: Two times a day (BID) | ORAL | 3 refills | Status: DC

## 2017-01-23 MED ORDER — FUROSEMIDE 40 MG PO TABS: 40.0000 mg | ORAL_TABLET | Freq: Every day | ORAL | 3 refills | Status: DC

## 2017-01-23 MED ORDER — DILTIAZEM HCL ER COATED BEADS 120 MG PO CP24: 120.0000 mg | ORAL_CAPSULE | Freq: Every day | ORAL | 3 refills | Status: DC

## 2017-01-23 MED ORDER — LISINOPRIL 40 MG PO TABS: 40.0000 mg | ORAL_TABLET | Freq: Every day | ORAL | 3 refills | Status: DC

## 2017-01-23 NOTE — Patient Instructions (Signed)
Medication Instructions:  Your physician recommends that you continue on your current medications as directed. Please refer to the Current Medication list given to you today.  -- If you need a refill on your cardiac medications before your next appointment, please call your pharmacy. --  Labwork: None ordered  Testing/Procedures: None ordered  Follow-Up: Your physician wants you to follow-up in: 1 year with Dr. Tommye Standard.  You will receive a reminder letter in the mail two months in advance. If you don't receive a letter, please call our office to schedule the follow-up appointment.  Thank you for choosing [facility]!!   Frederik Schmidt, RN [phone]  Any Other Special Instructions Will Be Listed Below (If Applicable).

## 2017-01-23 NOTE — Progress Notes (Signed)
PCP: Janith Lima, MD   Primary EP: Dr Andrey Cota is a 76 y.o. male who presents today for routine electrophysiology followup.  Since last being seen in our clinic, the patient reports doing very well.  Today, he denies symptoms of palpitations, chest pain, shortness of breath,  lower extremity edema, dizziness, presyncope, or syncope.  The patient is otherwise without complaint today.   Past Medical History:  Diagnosis Date  . Cancer ([place])    skin  . Chronic renal impairment   . Diverticulosis   . DJD (degenerative joint disease)   . Elevated PSA    Dr Junious Silk  . H/O hiatal hernia   . Hypertension   . 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 Permanent atrial fibrillation ([place])   . PONV (postoperative nausea and vomiting)    no  problems recently   Past Surgical History:  Procedure Laterality Date  . COLONOSCOPY  2003  . HERNIA REPAIR    . HIATAL HERNIA REPAIR     Dr Kaylyn Lim , laparoscopic  . INGUINAL HERNIA REPAIR Left 03/05/2013   Procedure: HERNIA REPAIR INGUINAL ADULT;  Surgeon: [name], MD;  Location: [facility];  Service: General;  Laterality: Left;  . INSERTION OF MESH Left 03/05/2013   Procedure: INSERTION OF MESH;  Surgeon: [name], MD;  Location: [place];  Service: General;  Laterality: Left;  . TOTAL HIP ARTHROPLASTY Right 07  . TOTAL KNEE ARTHROPLASTY Right 07    ROS- all systems are reviewed and negatives except as per HPI above  Current Outpatient Prescriptions  Medication Sig Dispense Refill  . acetaminophen (TYLENOL) 650 MG CR tablet Take 650 mg by mouth every 8 (eight) hours as needed for pain.    Marland Kitchen allopurinol (ZYLOPRIM) 100 MG tablet take 1 tablet by mouth once daily 90 tablet 1  . Dermatological Products, Misc. [facility]) lotion Apply 1 Act topically 2 (two) times daily with a meal. 100 g 11  . diltiazem (CARDIZEM CD) 120 MG 24 hr capsule take 1 capsule by mouth  once daily 30 capsule 0  . finasteride (PROSCAR) 5 MG tablet Take 5 mg by mouth daily. Rx'ed by [name]    . furosemide (LASIX) 40 MG tablet take 1 tablet by mouth once daily 90 tablet 0  . lisinopril (PRINIVIL,ZESTRIL) 40 MG tablet take 1 tablet by mouth once daily 90 tablet 1  . metoprolol tartrate (LOPRESSOR) 100 MG tablet Take 1 tablet (100 mg total) by mouth 2 (two) times daily. Please keep upcoming appointment for further refills 60 tablet 1  . warfarin (COUMADIN) 5 MG tablet Take as directed by [facility] 30 tablet 3   No current facility-administered medications for this visit.     Physical Exam: Vitals:   01/23/17 1222  BP: 138/80  Pulse: 98  SpO2: 97%  Weight: 215 lb 6.4 oz (97.7 kg)  Height: 6\' 1"  (1.854 m)    GEN- The patient is well appearing, alert and oriented x 3 today.   Head- normocephalic, atraumatic Eyes-  Sclera clear, conjunctiva pink Ears- hearing intact Oropharynx- clear Lungs- Clear to ausculation bilaterally, normal work of breathing Heart- Regular rate and rhythm, no murmurs, rubs or gallops, PMI not laterally displaced GI- soft, NT, ND, + BS Extremities- no clubbing, cyanosis, or edema  EKG tracing ordered today is personally reviewed and shows afib, V rate 98 bpm  echp 06/03/15 is reviewed-  EF 50-55%  Assessment and Plan:  1. Permanent afib Rate controlled Asymptomatic Continue coumadin long term  2. HTN Stable No change required today  Return to see EP PA-C in a year   Thompson Grayer MD, [facility] 01/23/2017 12:39 PM

## 2017-02-03 ENCOUNTER — Ambulatory Visit (INDEPENDENT_AMBULATORY_CARE_PROVIDER_SITE_OTHER): Payer: Medicare Other | Admitting: General Practice

## 2017-02-03 DIAGNOSIS — Z7901 Long term (current) use of anticoagulants: Secondary | ICD-10-CM

## 2017-02-03 DIAGNOSIS — I4891 Unspecified atrial fibrillation: Secondary | ICD-10-CM

## 2017-02-03 LAB — POCT INR: INR: 2.7

## 2017-02-03 NOTE — Patient Instructions (Signed)
Pre visit review using our clinic review tool, if applicable. No additional management support is needed unless otherwise documented below in the visit note. 

## 2017-02-27 ENCOUNTER — Telehealth: Payer: Self-pay

## 2017-02-27 DIAGNOSIS — M10022 Idiopathic gout, left elbow: Secondary | ICD-10-CM

## 2017-02-27 NOTE — Telephone Encounter (Signed)
Pt rq refill of allopurinol. Pt needs to schedule an appt in December before I can sent in refills.

## 2017-02-28 NOTE — Telephone Encounter (Signed)
Called pt to him know. No answer and no voicemail has been set up to be able to leave a message.

## 2017-03-02 MED ORDER — ALLOPURINOL 100 MG PO TABS: 100.0000 mg | ORAL_TABLET | Freq: Every day | ORAL | 0 refills | Status: DC

## 2017-03-02 NOTE — Telephone Encounter (Signed)
Appt made for 12/3 w/ Jones  Please send to [facility] aid on [address]

## 2017-03-02 NOTE — Addendum Note (Signed)
Addended by: Aviva Signs M on: 03/02/2017 01:47 PM   Modules accepted: Orders

## 2017-03-02 NOTE — Telephone Encounter (Signed)
30 day supply sent to [facility] as requested.

## 2017-03-17 ENCOUNTER — Ambulatory Visit: Payer: Medicare Other

## 2017-03-20 ENCOUNTER — Encounter: Payer: Self-pay | Admitting: Internal Medicine

## 2017-03-20 ENCOUNTER — Ambulatory Visit: Payer: Medicare Other | Admitting: Internal Medicine

## 2017-03-20 ENCOUNTER — Other Ambulatory Visit (INDEPENDENT_AMBULATORY_CARE_PROVIDER_SITE_OTHER): Payer: Medicare Other

## 2017-03-20 VITALS — BP 120/80 | HR 75 | Resp 16 | Ht 73.0 in | Wt 214.0 lb

## 2017-03-20 DIAGNOSIS — N183 Chronic kidney disease, stage 3 unspecified: Secondary | ICD-10-CM

## 2017-03-20 DIAGNOSIS — M10022 Idiopathic gout, left elbow: Secondary | ICD-10-CM

## 2017-03-20 DIAGNOSIS — E781 Pure hyperglyceridemia: Secondary | ICD-10-CM

## 2017-03-20 DIAGNOSIS — I10 Essential (primary) hypertension: Secondary | ICD-10-CM

## 2017-03-20 DIAGNOSIS — Z23 Encounter for immunization: Secondary | ICD-10-CM | POA: Diagnosis not present

## 2017-03-20 DIAGNOSIS — E559 Vitamin D deficiency, unspecified: Secondary | ICD-10-CM

## 2017-03-20 DIAGNOSIS — I4819 Other persistent atrial fibrillation: Secondary | ICD-10-CM

## 2017-03-20 DIAGNOSIS — M1000 Idiopathic gout, unspecified site: Secondary | ICD-10-CM

## 2017-03-20 DIAGNOSIS — I481 Persistent atrial fibrillation: Secondary | ICD-10-CM | POA: Diagnosis not present

## 2017-03-20 LAB — CBC WITH DIFFERENTIAL/PLATELET
Basophils Absolute: 0 10*3/uL (ref 0.0–0.1)
Basophils Relative: 0.6 % (ref 0.0–3.0)
Eosinophils Absolute: 0.3 10*3/uL (ref 0.0–0.7)
Eosinophils Relative: 3.2 % (ref 0.0–5.0)
HCT: 42.8 % (ref 39.0–52.0)
Hemoglobin: 13.9 g/dL (ref 13.0–17.0)
Lymphocytes Relative: 16.7 % (ref 12.0–46.0)
Lymphs Abs: 1.4 10*3/uL (ref 0.7–4.0)
MCHC: 32.6 g/dL (ref 30.0–36.0)
MCV: 96.6 fl (ref 78.0–100.0)
Monocytes Absolute: 0.6 10*3/uL (ref 0.1–1.0)
Monocytes Relative: 7.6 % (ref 3.0–12.0)
Neutro Abs: 5.9 10*3/uL (ref 1.4–7.7)
Neutrophils Relative %: 71.9 % (ref 43.0–77.0)
Platelets: 271 10*3/uL (ref 150.0–400.0)
RBC: 4.43 Mil/uL (ref 4.22–5.81)
RDW: 15.3 % (ref 11.5–15.5)
WBC: 8.2 10*3/uL (ref 4.0–10.5)

## 2017-03-20 LAB — VITAMIN D 25 HYDROXY (VIT D DEFICIENCY, FRACTURES): VITD: 35.06 ng/mL (ref 30.00–100.00)

## 2017-03-20 MED ORDER — PNEUMOCOCCAL VAC POLYVALENT 25 MCG/0.5ML IJ INJ: 0.5000 mL | INJECTION | INTRAMUSCULAR | Status: DC

## 2017-03-20 NOTE — Progress Notes (Signed)
Subjective:  Patient ID: Nathan [name], male    DOB: 07/26/1940  Age: 76 y.o. MRN: [id number]  CC: Hypertension; Atrial Fibrillation; and Hyperlipidemia   HPI ALQUAN MORRISH presents for f/up - He tells me that his blood pressure has been well controlled and he has had no recent episodes of palpitations, shortness of breath, chest pain, dizziness, lightheadedness, DOE, or near syncope.  He requests a refill on allopurinol, fortunately he has had no recent episodes of gouty arthropathy.  Outpatient Medications Prior to Visit  Medication Sig Dispense Refill  . acetaminophen (TYLENOL) 650 MG CR tablet Take 650 mg by mouth every 8 (eight) hours as needed for pain.    . Dermatological Products, Misc. [facility]) lotion Apply 1 Act topically 2 (two) times daily with a meal. 100 g 11  . diltiazem (CARDIZEM CD) 120 MG 24 hr capsule Take 1 capsule (120 mg total) by mouth daily. 90 capsule 3  . finasteride (PROSCAR) 5 MG tablet Take 5 mg by mouth daily. Rx'ed by [name]    . furosemide (LASIX) 40 MG tablet Take 1 tablet (40 mg total) by mouth daily. 90 tablet 3  . lisinopril (PRINIVIL,ZESTRIL) 40 MG tablet Take 1 tablet (40 mg total) by mouth daily. 90 tablet 3  . metoprolol tartrate (LOPRESSOR) 100 MG tablet Take 1 tablet (100 mg total) by mouth 2 (two) times daily. 180 tablet 3  . warfarin (COUMADIN) 5 MG tablet Take as directed by [facility] 30 tablet 3  . allopurinol (ZYLOPRIM) 100 MG tablet Take 1 tablet (100 mg total) daily by mouth. 30 tablet 0   No facility-administered medications prior to visit.     ROS Review of Systems  Constitutional: Negative.  Negative for appetite change, diaphoresis, fatigue and unexpected weight change.  HENT: Negative.   Eyes: Negative for visual disturbance.  Respiratory: Negative for cough, chest tightness, shortness of breath and wheezing.   Cardiovascular: Negative for chest pain, palpitations and leg swelling.  Gastrointestinal: Negative  for abdominal pain, diarrhea and nausea.  Endocrine: Negative.   Genitourinary: Negative.  Negative for difficulty urinating.  Musculoskeletal: Negative.  Negative for arthralgias, joint swelling and neck pain.  Skin: Negative.  Negative for color change and rash.  Allergic/Immunologic: Negative.   Neurological: Negative.  Negative for dizziness, weakness and light-headedness.  Hematological: Negative for adenopathy. Does not bruise/bleed easily.  Psychiatric/Behavioral: Negative.     Objective:  BP 120/80   Pulse 75   Resp 16   Ht 6\' 1"  (1.854 m)   Wt 214 lb (97.1 kg)   SpO2 98%   BMI 28.23 kg/m   BP Readings from Last 3 Encounters:  03/20/17 120/80  01/23/17 138/80  09/07/16 128/82    Wt Readings from Last 3 Encounters:  03/20/17 214 lb (97.1 kg)  01/23/17 215 lb 6.4 oz (97.7 kg)  09/07/16 222 lb 12 oz (101 kg)    Physical Exam  Constitutional: He is oriented to person, place, and time.  Non-toxic appearance. He does not have a sickly appearance. He does not appear ill. No distress.  HENT:  Mouth/Throat: Oropharynx is clear and moist. No oropharyngeal exudate.  Eyes: Conjunctivae are normal. No scleral icterus.  Neck: Normal range of motion. Neck supple. No JVD present. No thyromegaly present.  Cardiovascular: Normal rate, S1 normal, S2 normal and normal heart sounds. An irregularly irregular rhythm present. Exam reveals no gallop.  No murmur heard. Pulmonary/Chest: Effort normal and breath sounds normal. No respiratory distress. He has no  rales.  Abdominal: Soft. Bowel sounds are normal. He exhibits no distension and no mass. There is no tenderness. There is no guarding.  Musculoskeletal: Normal range of motion. He exhibits no edema, tenderness or deformity.  Lymphadenopathy:    He has no cervical adenopathy.  Neurological: He is alert and oriented to person, place, and time.  Skin: Skin is warm and dry. No rash noted. He is not diaphoretic. No erythema. No pallor.    Vitals reviewed.   Lab Results  Component Value Date   WBC 8.2 03/20/2017   HGB 13.9 03/20/2017   HCT 42.8 03/20/2017   PLT 271.0 03/20/2017   GLUCOSE 101 (H) 03/20/2017   CHOL 149 03/20/2017   TRIG 137.0 03/20/2017   HDL 45.90 03/20/2017   LDLCALC 76 03/20/2017   ALT 9 03/20/2017   AST 13 03/20/2017   NA 145 03/20/2017   K 4.4 03/20/2017   CL 106 03/20/2017   CREATININE 1.55 (H) 03/20/2017   BUN 27 (H) 03/20/2017   CO2 28 03/20/2017   TSH 1.97 03/20/2017   INR 2.7 02/03/2017   HGBA1C 5.4 01/20/2015    Mr Abdomen Wwo Contrast  Result Date: 08/03/2016 CLINICAL DATA:  Annual follow-up for known renal and pancreatic cysts EXAM: MRI ABDOMEN WITHOUT AND WITH CONTRAST TECHNIQUE: Multiplanar multisequence MR imaging of the abdomen was performed both before and after the administration of intravenous contrast. CONTRAST:  21mL MULTIHANCE GADOBENATE DIMEGLUMINE 529 MG/ML IV SOLN COMPARISON:  MRI abdomen dated 05/08/2015. CT abdomen/ pelvis dated 01/22/2015. FINDINGS: Lower chest: Lung bases are clear.  Cardiomegaly. Hepatobiliary: Multiple hepatic cysts, measuring up to 3.3 cm in segment 2 (series 7/ image 13). No suspicious/enhancing hepatic lesions. Gallbladder is unremarkable. No intrahepatic or extrahepatic ductal dilatation. Pancreas: 13 mm nonenhancing unilocular cyst in the pancreatic head (series 7/ image 27). No associated main pancreatic ductal dilatation. No pancreatic atrophy. Spleen: Splenic cysts, including a dominant 5.2 cm cyst in the splenic hilum (series 7/ image 25), without enhancement. Adrenals/Urinary Tract:  Adrenal glands are within normal limits. Multiple bilateral renal cysts of varying sizes and complexities, many of which are hemorrhagic, including a dominant 10.7 cm exophytic hemorrhagic cyst along the right upper kidney (series 1000/ image 53). None of these cysts demonstrate enhancement following contrast administration. No hydronephrosis. Stomach/Bowel: Stomach is  notable for a small hiatal hernia. Visualized bowel is unremarkable. Vascular/Lymphatic: No evidence of abdominal aortic aneurysm. Mild ectasia of the infrarenal abdominal aorta measuring up to 3.0 cm (series 1004/ image 81). No suspicious abdominal lymphadenopathy. Other:  No abdominal ascites. Musculoskeletal: No focal osseous lesions. IMPRESSION: 13 mm nonenhancing cyst in the pancreatic head, favored to reflect a pseudocyst or side branch IPMN, unchanged. Given the patient's current age, at least one additional follow-up study is suggested in 2 years. This recommendation follows ACR consensus guidelines: Management of Incidental Pancreatic Cysts: A White Paper of the ACR Incidental Findings Committee. [place] 6269;48:546-270. Multiple bilateral simple and hemorrhagic renal cysts, without enhancement, benign (Bosniak I-II). Multiple hepatic and splenic cysts, benign. Additional ancillary findings as above. Electronically Signed   By: Julian Hy M.D.   On: 08/03/2016 17:14   Mr 3d Recon At Scanner  Result Date: 08/03/2016 CLINICAL DATA:  Annual follow-up for known renal and pancreatic cysts EXAM: MRI ABDOMEN WITHOUT AND WITH CONTRAST TECHNIQUE: Multiplanar multisequence MR imaging of the abdomen was performed both before and after the administration of intravenous contrast. CONTRAST:  73mL MULTIHANCE GADOBENATE DIMEGLUMINE 529 MG/ML IV SOLN COMPARISON:  MRI abdomen dated 05/08/2015. CT abdomen/ pelvis dated 01/22/2015. FINDINGS: Lower chest: Lung bases are clear.  Cardiomegaly. Hepatobiliary: Multiple hepatic cysts, measuring up to 3.3 cm in segment 2 (series 7/ image 13). No suspicious/enhancing hepatic lesions. Gallbladder is unremarkable. No intrahepatic or extrahepatic ductal dilatation. Pancreas: 13 mm nonenhancing unilocular cyst in the pancreatic head (series 7/ image 27). No associated main pancreatic ductal dilatation. No pancreatic atrophy. Spleen: Splenic cysts, including a dominant  5.2 cm cyst in the splenic hilum (series 7/ image 25), without enhancement. Adrenals/Urinary Tract:  Adrenal glands are within normal limits. Multiple bilateral renal cysts of varying sizes and complexities, many of which are hemorrhagic, including a dominant 10.7 cm exophytic hemorrhagic cyst along the right upper kidney (series 1000/ image 53). None of these cysts demonstrate enhancement following contrast administration. No hydronephrosis. Stomach/Bowel: Stomach is notable for a small hiatal hernia. Visualized bowel is unremarkable. Vascular/Lymphatic: No evidence of abdominal aortic aneurysm. Mild ectasia of the infrarenal abdominal aorta measuring up to 3.0 cm (series 1004/ image 81). No suspicious abdominal lymphadenopathy. Other:  No abdominal ascites. Musculoskeletal: No focal osseous lesions. IMPRESSION: 13 mm nonenhancing cyst in the pancreatic head, favored to reflect a pseudocyst or side branch IPMN, unchanged. Given the patient's current age, at least one additional follow-up study is suggested in 2 years. This recommendation follows ACR consensus guidelines: Management of Incidental Pancreatic Cysts: A White Paper of the ACR Incidental Findings Committee. [place] 2876;81:157-262. Multiple bilateral simple and hemorrhagic renal cysts, without enhancement, benign (Bosniak I-II). Multiple hepatic and splenic cysts, benign. Additional ancillary findings as above. Electronically Signed   By: Julian Hy M.D.   On: 08/03/2016 17:14    Assessment & Plan:   Marwin was seen today for hypertension, atrial fibrillation and hyperlipidemia.  Diagnoses and all orders for this visit:  Persistent atrial fibrillation ([place])- He has good rate control.  Will continue anticoagulation with Coumadin and will continue metoprolol at the current dose. -     Thyroid Panel With TSH; Future  Essential hypertension- His BP is well controlled.  Labs are negative for secondary causes or endorgan damage. -      Comprehensive metabolic panel; Future -     Thyroid Panel With TSH; Future  Idiopathic gout, unspecified chronicity, unspecified site- He has achieved his uric acid goal and has had no recent flares of gouty arthropathy.  Will continue allopurinol at the current dose. -     Uric acid; Future -     allopurinol (ZYLOPRIM) 100 MG tablet; Take 1 tablet (100 mg total) by mouth daily.  Hypertriglyceridemia- Improvement noted with his triglycerides down to 137.  He will continue to perfect his lifestyle modifications. -     Lipid panel; Future  Kidney disease, chronic, stage III (GFR 30-59 ml/min) ([place])- His renal function is stable.  He will continue to avoid nephrotoxic agents. -     CBC with Differential/Platelet; Future  Vitamin D deficiency- His vitamin D level is normal now at 35. -     Comprehensive metabolic panel; Future -     VITAMIN D 25 Hydroxy (Vit-D Deficiency, Fractures); Future  Need for pneumococcal vaccination -     Discontinue: pneumococcal 23 valent vaccine (PNU-IMMUNE) injection 0.5 mL -     Pneumococcal polysaccharide vaccine 23-valent greater than or equal to 2yo subcutaneous/IM  Idiopathic gout of left elbow, unspecified chronicity   I have changed Dominic W. Bomkamp's allopurinol. I am also having him maintain his finasteride, acetaminophen, EPICERAM,  warfarin, diltiazem, furosemide, lisinopril, and metoprolol tartrate.  Meds ordered this encounter  Medications  . DISCONTD: pneumococcal 23 valent vaccine (PNU-IMMUNE) injection 0.5 mL  . allopurinol (ZYLOPRIM) 100 MG tablet    Sig: Take 1 tablet (100 mg total) by mouth daily.    Dispense:  90 tablet    Refill:  1     Follow-up: Return in about 6 months (around 09/18/2017).  Scarlette Calico, MD

## 2017-03-20 NOTE — Patient Instructions (Signed)
Atrial Fibrillation Atrial fibrillation is a type of irregular or rapid heartbeat (arrhythmia). In atrial fibrillation, the heart quivers continuously in a chaotic pattern. This occurs when parts of the heart receive disorganized signals that make the heart unable to pump blood normally. This can increase the risk for stroke, heart failure, and other heart-related conditions. There are different types of atrial fibrillation, including:  Paroxysmal atrial fibrillation. This type starts suddenly, and it usually stops on its own shortly after it starts.  Persistent atrial fibrillation. This type often lasts longer than a week. It may stop on its own or with treatment.  Long-lasting persistent atrial fibrillation. This type lasts longer than 12 months.  Permanent atrial fibrillation. This type does not go away.  Talk with your health care provider to learn about the type of atrial fibrillation that you have. What are the causes? This condition is caused by some heart-related conditions or procedures, including:  A heart attack.  Coronary artery disease.  Heart failure.  Heart valve conditions.  High blood pressure.  Inflammation of the sac that surrounds the heart (pericarditis).  Heart surgery.  Certain heart rhythm disorders, such as Wolf-Parkinson-White syndrome.  Other causes include:  Pneumonia.  Obstructive sleep apnea.  Blockage of an artery in the lungs (pulmonary embolism, or PE).  Lung cancer.  Chronic lung disease.  Thyroid problems, especially if the thyroid is overactive (hyperthyroidism).  Caffeine.  Excessive alcohol use or illegal drug use.  Use of some medicines, including certain decongestants and diet pills.  Sometimes, the cause cannot be found. What increases the risk? This condition is more likely to develop in:  People who are older in age.  People who smoke.  People who have diabetes mellitus.  People who are overweight  (obese).  Athletes who exercise vigorously.  What are the signs or symptoms? Symptoms of this condition include:  A feeling that your heart is beating rapidly or irregularly.  A feeling of discomfort or pain in your chest.  Shortness of breath.  Sudden light-headedness or weakness.  Getting tired easily during exercise.  In some cases, there are no symptoms. How is this diagnosed? Your health care provider may be able to detect atrial fibrillation when taking your pulse. If detected, this condition may be diagnosed with:  An electrocardiogram (ECG).  A Holter monitor test that records your heartbeat patterns over a 24-hour period.  Transthoracic echocardiogram (TTE) to evaluate how blood flows through your heart.  Transesophageal echocardiogram (TEE) to view more detailed images of your heart.  A stress test.  Imaging tests, such as a CT scan or chest X-ray.  Blood tests.  How is this treated? The main goals of treatment are to prevent blood clots from forming and to keep your heart beating at a normal rate and rhythm. The type of treatment that you receive depends on many factors, such as your underlying medical conditions and how you feel when you are experiencing atrial fibrillation. This condition may be treated with:  Medicine to slow down the heart rate, bring the heart's rhythm back to normal, or prevent clots from forming.  Electrical cardioversion. This is a procedure that resets your heart's rhythm by delivering a controlled, low-energy shock to the heart through your skin.  Different types of ablation, such as catheter ablation, catheter ablation with pacemaker, or surgical ablation. These procedures destroy the heart tissues that send abnormal signals. When the pacemaker is used, it is placed under your skin to help your heart beat in   a regular rhythm.  Follow these instructions at home:  Take over-the counter and prescription medicines only as told by your  health care provider.  If your health care provider prescribed a blood-thinning medicine (anticoagulant), take it exactly as told. Taking too much blood-thinning medicine can cause bleeding. If you do not take enough blood-thinning medicine, you will not have the protection that you need against stroke and other problems.  Do not use tobacco products, including cigarettes, chewing tobacco, and e-cigarettes. If you need help quitting, ask your health care provider.  If you have obstructive sleep apnea, manage your condition as told by your health care provider.  Do not drink alcohol.  Do not drink beverages that contain caffeine, such as coffee, soda, and tea.  Maintain a healthy weight. Do not use diet pills unless your health care provider approves. Diet pills may make heart problems worse.  Follow diet instructions as told by your health care provider.  Exercise regularly as told by your health care provider.  Keep all follow-up visits as told by your health care provider. This is important. How is this prevented?  Avoid drinking beverages that contain caffeine or alcohol.  Avoid certain medicines, especially medicines that are used for breathing problems.  Avoid certain herbs and herbal medicines, such as those that contain ephedra or ginseng.  Do not use illegal drugs, such as cocaine and amphetamines.  Do not smoke.  Manage your high blood pressure. Contact a health care provider if:  You notice a change in the rate, rhythm, or strength of your heartbeat.  You are taking an anticoagulant and you notice increased bruising.  You tire more easily when you exercise or exert yourself. Get help right away if:  You have chest pain, abdominal pain, sweating, or weakness.  You feel nauseous.  You notice blood in your vomit, bowel movement, or urine.  You have shortness of breath.  You suddenly have swollen feet and ankles.  You feel dizzy.  You have sudden weakness or  numbness of the face, arm, or leg, especially on one side of the body.  You have trouble speaking, trouble understanding, or both (aphasia).  Your face or your eyelid droops on one side. These symptoms may represent a serious problem that is an emergency. Do not wait to see if the symptoms will go away. Get medical help right away. Call your local emergency services (911 in the [place] Do not drive yourself to the hospital. This information is not intended to replace advice given to you by your health care provider. Make sure you discuss any questions you have with your health care provider. Document Released: 04/04/2005 Document Revised: 08/12/2015 Document Reviewed: 07/30/2014 Elsevier Interactive Patient Education  2017 [employer]  

## 2017-03-21 ENCOUNTER — Encounter: Payer: Self-pay | Admitting: Internal Medicine

## 2017-03-21 LAB — THYROID PANEL WITH TSH
Free Thyroxine Index: 2.1 (ref 1.4–3.8)
T3 Uptake: 31 % (ref 22–35)
T4, Total: 6.9 ug/dL (ref 4.9–10.5)
TSH: 1.97 mIU/L (ref 0.40–4.50)

## 2017-03-21 LAB — COMPREHENSIVE METABOLIC PANEL
ALT: 9 U/L (ref 0–53)
AST: 13 U/L (ref 0–37)
Albumin: 4.1 g/dL (ref 3.5–5.2)
Alkaline Phosphatase: 71 U/L (ref 39–117)
BUN: 27 mg/dL — ABNORMAL HIGH (ref 6–23)
CO2: 28 mEq/L (ref 19–32)
Calcium: 9.5 mg/dL (ref 8.4–10.5)
Chloride: 106 mEq/L (ref 96–112)
Creatinine, Ser: 1.55 mg/dL — ABNORMAL HIGH (ref 0.40–1.50)
GFR: 46.21 mL/min — ABNORMAL LOW (ref >60.00)
Glucose, Bld: 101 mg/dL — ABNORMAL HIGH (ref 70–99)
Potassium: 4.4 mEq/L (ref 3.5–5.1)
Sodium: 145 mEq/L (ref 135–145)
Total Bilirubin: 0.6 mg/dL (ref 0.2–1.2)
Total Protein: 7.1 g/dL (ref 6.0–8.3)

## 2017-03-21 LAB — LIPID PANEL
Cholesterol: 149 mg/dL (ref 0–200)
HDL: 45.9 mg/dL (ref >39.00)
LDL Cholesterol: 76 mg/dL (ref 0–99)
NonHDL: 102.98
Total CHOL/HDL Ratio: 3
Triglycerides: 137 mg/dL (ref 0.0–149.0)
VLDL: 27.4 mg/dL (ref 0.0–40.0)

## 2017-03-21 LAB — URIC ACID: Uric Acid, Serum: 6.8 mg/dL (ref 4.0–7.8)

## 2017-03-21 MED ORDER — ALLOPURINOL 100 MG PO TABS: 100.0000 mg | ORAL_TABLET | Freq: Every day | ORAL | 1 refills | Status: DC

## 2017-03-24 ENCOUNTER — Ambulatory Visit: Payer: Medicare Other | Admitting: General Practice

## 2017-03-24 DIAGNOSIS — Z7901 Long term (current) use of anticoagulants: Secondary | ICD-10-CM

## 2017-03-24 DIAGNOSIS — I4891 Unspecified atrial fibrillation: Secondary | ICD-10-CM

## 2017-03-24 LAB — POCT INR: INR: 1.9

## 2017-03-24 NOTE — Patient Instructions (Addendum)
Pre visit review using our clinic review tool, if applicable. No additional management support is needed unless otherwise documented below in the visit note.  Take 1 1/2 tablets today (12/7) and then continue to take 1/2 tablet daily except 1 tablet on Monday, Wednesday and Fridays.  Re-check in 4 weeks.

## 2017-03-31 ENCOUNTER — Other Ambulatory Visit: Payer: Self-pay | Admitting: Internal Medicine

## 2017-03-31 DIAGNOSIS — M1000 Idiopathic gout, unspecified site: Secondary | ICD-10-CM

## 2017-04-21 ENCOUNTER — Ambulatory Visit: Payer: Medicare Other

## 2017-04-28 ENCOUNTER — Ambulatory Visit (INDEPENDENT_AMBULATORY_CARE_PROVIDER_SITE_OTHER): Payer: Medicare Other | Admitting: General Practice

## 2017-04-28 DIAGNOSIS — Z7901 Long term (current) use of anticoagulants: Secondary | ICD-10-CM | POA: Diagnosis not present

## 2017-04-28 LAB — POCT INR: INR: 2.3

## 2017-04-28 NOTE — Patient Instructions (Addendum)
Pre visit review using our clinic review tool, if applicable. No additional management support is needed unless otherwise documented below in the visit note.  Continue to take 1/2 tablet daily except 1 tablet on Monday, Wednesday and Fridays.  Re-check in 6 weeks.

## 2017-05-09 DIAGNOSIS — C4442 Squamous cell carcinoma of skin of scalp and neck: Secondary | ICD-10-CM | POA: Diagnosis not present

## 2017-05-09 DIAGNOSIS — D485 Neoplasm of uncertain behavior of skin: Secondary | ICD-10-CM | POA: Diagnosis not present

## 2017-05-26 ENCOUNTER — Telehealth: Payer: Self-pay | Admitting: Internal Medicine

## 2017-05-26 NOTE — Telephone Encounter (Signed)
coumadin refill Last OV: 03/24/17 Last Refill:03/20/17 Pharmacy: [facility]

## 2017-05-26 NOTE — Telephone Encounter (Unsigned)
Copied from [place]. Topic: Quick Communication - Rx Refill/Question >> May 26, 2017  1:42 PM Carolyn Stare wrote: Medication   warfarin (COUMADIN) 5 MG tablet  Has the patient contacted their pharmacy yes    Preferred Pharmacy [facility] [address]   Agent: Please be advised that RX refills may take up to 3 business days. We ask that you follow-up with your pharmacy.

## 2017-05-29 ENCOUNTER — Other Ambulatory Visit: Payer: Self-pay | Admitting: General Practice

## 2017-05-29 MED ORDER — WARFARIN SODIUM 5 MG PO TABS: ORAL_TABLET | ORAL | 3 refills | Status: DC

## 2017-05-30 ENCOUNTER — Other Ambulatory Visit: Payer: Self-pay | Admitting: Internal Medicine

## 2017-06-01 DIAGNOSIS — L905 Scar conditions and fibrosis of skin: Secondary | ICD-10-CM | POA: Diagnosis not present

## 2017-06-01 DIAGNOSIS — C4442 Squamous cell carcinoma of skin of scalp and neck: Secondary | ICD-10-CM | POA: Diagnosis not present

## 2017-06-09 ENCOUNTER — Ambulatory Visit: Payer: Medicare Other | Admitting: General Practice

## 2017-06-09 DIAGNOSIS — I4891 Unspecified atrial fibrillation: Secondary | ICD-10-CM

## 2017-06-09 DIAGNOSIS — Z7901 Long term (current) use of anticoagulants: Secondary | ICD-10-CM

## 2017-06-09 LAB — POCT INR: INR: 1.6

## 2017-06-09 NOTE — Patient Instructions (Addendum)
Pre visit review using our clinic review tool, if applicable. No additional management support is needed unless otherwise documented below in the visit note.  Take 1 1/2 tablets today (2/22) and take 1 tablet tomorrow (2/23) and then continue to take 1/2 tablet daily except 1 tablet on Monday, Wednesday and Fridays.  Re-check in 3 weeks.

## 2017-06-30 ENCOUNTER — Ambulatory Visit (INDEPENDENT_AMBULATORY_CARE_PROVIDER_SITE_OTHER): Payer: Medicare Other | Admitting: General Practice

## 2017-06-30 DIAGNOSIS — Z7901 Long term (current) use of anticoagulants: Secondary | ICD-10-CM

## 2017-06-30 DIAGNOSIS — I4891 Unspecified atrial fibrillation: Secondary | ICD-10-CM | POA: Diagnosis not present

## 2017-06-30 LAB — POCT INR: INR: 2.4

## 2017-06-30 NOTE — Patient Instructions (Addendum)
Pre visit review using our clinic review tool, if applicable. No additional management support is needed unless otherwise documented below in the visit note.  Continue to take 1/2 tablet daily except 1 tablet on Monday, Wednesday and Fridays.  Re-check in 6 weeks.

## 2017-07-02 NOTE — Progress Notes (Signed)
Agree with management.  Jamear Carbonneau J Dontay Harm, MD  

## 2017-08-11 ENCOUNTER — Ambulatory Visit: Payer: Medicare Other | Admitting: General Practice

## 2017-08-11 DIAGNOSIS — Z7901 Long term (current) use of anticoagulants: Secondary | ICD-10-CM | POA: Diagnosis not present

## 2017-08-11 DIAGNOSIS — I4891 Unspecified atrial fibrillation: Secondary | ICD-10-CM | POA: Diagnosis not present

## 2017-08-11 LAB — POCT INR: INR: 3.3

## 2017-08-11 NOTE — Patient Instructions (Addendum)
Pre visit review using our clinic review tool, if applicable. No additional management support is needed unless otherwise documented below in the visit note.  Skip dose tomorrow (4/27) and then continue to take 1/2 tablet daily except 1 tablet on Monday, Wednesday and Fridays.  Re-check in 6 weeks per patient request.

## 2017-08-15 DIAGNOSIS — N401 Enlarged prostate with lower urinary tract symptoms: Secondary | ICD-10-CM | POA: Diagnosis not present

## 2017-08-15 DIAGNOSIS — R972 Elevated prostate specific antigen [PSA]: Secondary | ICD-10-CM | POA: Diagnosis not present

## 2017-08-15 DIAGNOSIS — R350 Frequency of micturition: Secondary | ICD-10-CM | POA: Diagnosis not present

## 2017-09-01 DIAGNOSIS — H35373 Puckering of macula, bilateral: Secondary | ICD-10-CM | POA: Diagnosis not present

## 2017-09-01 DIAGNOSIS — H353112 Nonexudative age-related macular degeneration, right eye, intermediate dry stage: Secondary | ICD-10-CM | POA: Diagnosis not present

## 2017-09-01 DIAGNOSIS — H35341 Macular cyst, hole, or pseudohole, right eye: Secondary | ICD-10-CM | POA: Diagnosis not present

## 2017-09-01 DIAGNOSIS — H353111 Nonexudative age-related macular degeneration, right eye, early dry stage: Secondary | ICD-10-CM | POA: Diagnosis not present

## 2017-09-17 NOTE — Progress Notes (Addendum)
[facility] Note  09/18/2017     CHIEF COMPLAINT Patient presents for Retina Evaluation   HISTORY OF PRESENT ILLNESS: Nathan [name] is a 77 y.o. male who presents to the clinic today for:   HPI    Retina Evaluation    In both eyes.  This started 1 month ago.  Associated Symptoms Negative for Distortion, Redness, Trauma, Shoulder/Hip pain, Fatigue, Weight Loss, Jaw Claudication, Glare, Pain, Floaters, Flashes, Blind Spot, Photophobia, Scalp Tenderness and Fever.  Context:  distance vision, mid-range vision and near vision.  Treatments tried include surgery.  Response to treatment was significant improvement.  I, the attending physician,  performed the HPI with the patient and updated documentation appropriately.          Comments    Referral of Dr. Parke Simmers for retina eval/ ERM OU/Pseusdo hole OD. Patient states his vision was blurry until he got new glasses yesterday.Pt reports he had floaters for years until he had cataract sx appx 5 yrs ago Os and 15 yrs ago OD with significant improvement . Denies flashes floaters and ocular pain.Denies vit's/gtt's       Last edited by Bernarda Caffey, MD on 09/18/2017 11:52 PM. (History)    Pt states he got new specs yesterday from Dr. Parke Simmers; Pt states he was sent here for a "hole in my eye"; Pt states about 16 years ago he "had a tear in my right eye that they lasered", Pt reports the tear was lasered by Dr. Zigmund Daniel; Pt states he had cataract sx by Dr. Dolores Lory and Dr. Herbert Deaner;   Referring physician: Monna Fam, MD [place], [place] [address]  HISTORICAL INFORMATION:   Selected notes from the MEDICAL RECORD NUMBER Referred by Dr. Parke Simmers for concern of pseudohole OD and ERM OU LEE- 05.17.19 ([facility]. [facility]) [BCVA OD: 20/30-2 OS: 20/25] Ocular Hx-  Pseudophakia OU, HTN retinopathy OU, non-exudative ARMD OU,  PMH- HTN, permanent A-Fib, chronic renal impairment, former smoker    CURRENT  MEDICATIONS: Current Outpatient Medications (Ophthalmic Drugs)  Medication Sig  . prednisoLONE acetate (PRED FORTE) 1 % ophthalmic suspension Place 1 drop into the left eye 4 (four) times daily.   No current facility-administered medications for this visit.  (Ophthalmic Drugs)   Current Outpatient Medications (Other)  Medication Sig  . acetaminophen (TYLENOL) 650 MG CR tablet Take 650 mg by mouth every 8 (eight) hours as needed for pain.  . Dermatological Products, Misc. [facility]) lotion Apply 1 Act topically 2 (two) times daily with a meal.  . diltiazem (CARDIZEM CD) 120 MG 24 hr capsule Take 1 capsule (120 mg total) by mouth daily.  . finasteride (PROSCAR) 5 MG tablet Take 5 mg by mouth daily. Rx'ed by [name]  . furosemide (LASIX) 40 MG tablet Take 1 tablet (40 mg total) by mouth daily.  Marland Kitchen lisinopril (PRINIVIL,ZESTRIL) 40 MG tablet Take 1 tablet (40 mg total) by mouth daily.  . metoprolol tartrate (LOPRESSOR) 100 MG tablet Take 1 tablet (100 mg total) by mouth 2 (two) times daily.  Marland Kitchen allopurinol (ZYLOPRIM) 100 MG tablet TAKE 1 TABLET BY MOUTH ONCE DAILY  . allopurinol (ZYLOPRIM) 100 MG tablet TAKE 1 TABLET BY MOUTH ONCE DAILY  . warfarin (COUMADIN) 5 MG tablet TAKE AS DIRECTED BY COUMADIN CLINIC. 30 DAY SUPPLY   No current facility-administered medications for this visit.  (Other)      REVIEW OF SYSTEMS: ROS    Positive for: Musculoskeletal, Endocrine, Cardiovascular, Eyes, Heme/Lymph  Negative for: Constitutional, Gastrointestinal, Neurological, Skin, Genitourinary, HENT, Respiratory, Psychiatric, Allergic/Imm   Last edited by Zenovia Jordan, LPN on 0/12/6281  6:62 PM. (History)       ALLERGIES Allergies  Allergen Reactions  . Tramadol Nausea And Vomiting    PAST MEDICAL HISTORY Past Medical History:  Diagnosis Date  . Cancer ([place])    skin  . Chronic renal impairment   . Diverticulosis   . DJD (degenerative joint disease)   . Elevated PSA    Dr Junious Silk  .  H/O hiatal hernia   . Hypertension   . 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 Permanent atrial fibrillation ([place])   . PONV (postoperative nausea and vomiting)    no  problems recently   Past Surgical History:  Procedure Laterality Date  . COLONOSCOPY  2003  . HERNIA REPAIR    . HIATAL HERNIA REPAIR     Dr Kaylyn Lim , laparoscopic  . INGUINAL HERNIA REPAIR Left 03/05/2013   Procedure: HERNIA REPAIR INGUINAL ADULT;  Surgeon: [name], MD;  Location: [facility];  Service: General;  Laterality: Left;  . INSERTION OF MESH Left 03/05/2013   Procedure: INSERTION OF MESH;  Surgeon: [name], MD;  Location: [facility];  Service: General;  Laterality: Left;  . TOTAL HIP ARTHROPLASTY Right 07  . TOTAL KNEE ARTHROPLASTY Right 07    FAMILY HISTORY Family History  Problem Relation Age of Onset  . Hypertension Mother   . Skin cancer Mother   . Brain cancer Sister   . Hypertension Brother   . Diabetes Brother   . Stroke Neg Hx   . Heart disease Neg Hx     SOCIAL HISTORY Social History   Tobacco Use  . Smoking status: Former Smoker    Packs/day: 0.25    Years: 3.00    Pack years: 0.75    Types: Cigarettes    Last attempt to quit: 04/17/1991    Years since quitting: 26.5  . Smokeless tobacco: Never Used  Substance Use Topics  . Alcohol use: Yes    Alcohol/week: 1.2 oz    Types: 2 Glasses of wine per week    Comment: occasionally  . Drug use: No         OPHTHALMIC EXAM:  Base Eye Exam    Visual Acuity (Snellen - Linear)      Right Left   Dist cc 20/70 +2 20/25 -1   Dist ph cc 20/50 -2 NI   Correction:  Glasses       Tonometry (Tonopen, 1:59 PM)      Right Left   Pressure 10 10       Pupils      Dark Light Shape React APD   Right 6 3 Round Brisk None   Left 6 3 Round Brisk None       Visual Fields (Counting fingers)      Left Right    Full Full       Extraocular Movement      Right Left     Full, Ortho Full, Ortho       Neuro/Psych    Oriented x3:  Yes   Mood/Affect:  Normal       Dilation    Both eyes:  1.0% Mydriacyl, 2.5% Phenylephrine @ 1:59 PM        Slit Lamp and Fundus Exam    Slit Lamp Exam  Right Left   Lids/Lashes Dermatochalasis - upper lid, Telangiectasia, mild Meibomian gland dysfunction Dermatochalasis - upper lid, Meibomian gland dysfunction, Telangiectasia   Conjunctiva/Sclera Temporal Pinguecula Nasal pterygium   Cornea Inferior hemisphere 4+ Punctate epithelial erosions, irregular epithelial  surface, Arcus Nasal pterygium comes on 11mm at 0900, iron line at head of pteryigia, Inferior 3+ Punctate epithelial erosions, Arcus   Anterior Chamber Deep and quiet Deep and quiet   Iris Round and dilated Round and dilated   Lens Three piece Posterior chamber intraocular lens in good position Three piece Posterior chamber intraocular lens in good position   Vitreous Vitreous syneresis, Posterior vitreous detachment Vitreous syneresis, Posterior vitreous detachment       Fundus Exam      Right Left   Disc Temporal Peripapillary atrophy Pink and Sharp, mild Peripapillary atrophy   C/D Ratio 0.3 0.35   Macula Blunted foveal reflex, Epiretinal membrane, RPE mottling and clumping, Drusen, No heme or edema Blunted foveal reflex, mild Retinal pigment epithelial mottling, No heme or edema   Vessels AV crossing changes Vascular attenuation, AV crossing changes   Periphery Attached, laser scars at 0430, opercutalted hole at 0500 with good surrounding laser, laser scars at 0400, laser scars at 0130, Reticular degeneration Attached, operculated hole at 0600 with pigment surrounding - no SRF, Reticular degeneration        Refraction    Wearing Rx      Sphere Cylinder Axis Add   Right -1.50 +1.50 003 +2.75   Left -1.50 +0.75 085 +2.75       Manifest Refraction      Sphere Cylinder Axis Dist VA   Right -1.50 +2.00 045 20/70   Left -1.50 +0.75 085 20/20-2           IMAGING AND PROCEDURES  Imaging and Procedures for @TODAY @  OCT, Retina - OU - Both Eyes       Right Eye Quality was good. Central Foveal Thickness: 266. Progression has no prior data. Findings include abnormal foveal contour, intraretinal fluid, no SRF, epiretinal membrane, retinal drusen , lamellar hole.   Left Eye Quality was good. Central Foveal Thickness: 251. Progression has no prior data. Findings include normal foveal contour, no IRF, no SRF, epiretinal membrane.   Notes *Images captured and stored on drive  Diagnosis / Impression:  OD: mild ERM with lamellar hole OS: NFP, No IRF/SRF, ERM  Clinical management:  See below  Abbreviations: NFP - Normal foveal profile. CME - cystoid macular edema. PED - pigment epithelial detachment. IRF - intraretinal fluid. SRF - subretinal fluid. EZ - ellipsoid zone. ERM - epiretinal membrane. ORA - outer retinal atrophy. ORT - outer retinal tubulation. SRHM - subretinal hyper-reflective material         Repair Retinal Breaks, Laser - OS - Left Eye       LASER PROCEDURE NOTE  Procedure:  Barrier laser retinopexy using laser indirect ophthalmoscope, LEFT eye   Diagnosis:   Retinal hole, LEFT eye                     6 o'clock anterior to equator   Surgeon: Bernarda Caffey, MD, PhD  Anesthesia: Topical  Informed consent obtained, operative eye marked, and time out performed prior to initiation of laser.   Laser settings:  Lumenis [id number] laser indirect ophthalmoscope Power: 270 mW Duration: 70 msec  # spots: 298  Placement of laser: Laser was placed in three confluent rows around retinal hole at 6 oclock anterior  to equator with additional rows anteriorly.  Complications: None.  Patient tolerated the procedure well and received written and verbal post-procedure care information/education.                 ASSESSMENT/PLAN:    ICD-10-CM   1. 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 Retinal tear of left eye H33.312 Repair Retinal Breaks, Laser - OS - Left Eye  4. Posterior vitreous detachment of both eyes H43.813   5. History of retinal tear Z86.69   6. Retinal edema H35.81 OCT, Retina - OU - Both Eyes  7. Pseudophakia of both eyes Z96.1     1. ERM OU (OD > OS) w/ lamellar hole OD-  The natural history, anatomy, potential for loss of vision, and treatment options including vitrectomy techniques and the complications of endophthalmitis, retinal detachment, vitreous hemorrhage, cataract progression and permanent vision loss discussed with the patient. - asymptomatic; denies metamorphopsia; happy with vision OD since receiving new glasses - pt wishes to monitor for now - F/U 2-3 months  2. Retinal hole OS-   - lightly pigmented operculated hole at 6 oclock ant to equator - no SRF - The incidence, risk factors, and natural history of retinal tear was discussed with patient.   - Potential treatment options including laser retinopexy and cryotherapy discussed with patient. - pt wishes to proceed with laser retinopexy OS today (06.03.19) - RBA of procedure discussed, questions answered - informed consent obtained and signed - see procedure note - start PF QID OS x7 days - f/u in 2-3 wks for POV  3. PVD / vitreous syneresis OU  Discussed findings and prognosis  No other RT or RD on 360 scleral depressed exam  Reviewed s/s of RT/RD  Strict return precautions for any such RT/RD signs/symptoms  4. History of retinal holes OD-  - multiple laser scars with operculated hole at 0500 with excellent laser surrounding - done by Dr. Zigmund Daniel  - monitor  5. No retinal edema on exam or OCT  6. Pseudophakia OU  - s/p CE/IOL  - doing well  - monitor   Ophthalmic Meds Ordered this visit:  No orders of the defined types were placed in this encounter.      Return in about 3 weeks (around 10/09/2017) for 2-3 wks for POV OS and f/u  ERM/lamellar hole OD.  There are no Patient Instructions on file for this visit.   Explained the diagnoses, plan, and follow up with the patient and they expressed understanding.  Patient expressed understanding of the importance of proper follow up care.  This document serves as a record of services personally performed by Gardiner Sleeper, MD, PhD. It was created on their behalf by Catha Brow, [place], a certified ophthalmic assistant. The creation of this record is the provider's dictation and/or activities during the visit.  Electronically signed by: Catha Brow, COA  06.02.19 4:54 PM    Gardiner Sleeper, M.D., Ph.D. Diseases & Surgery of the Retina and Vitreous [facility]  I have reviewed the above documentation for accuracy and completeness, and I agree with the above. Gardiner Sleeper, M.D., Ph.D. 10/10/17 4:54 PM    Abbreviations: M myopia (nearsighted); A astigmatism; H hyperopia (farsighted); P presbyopia; Mrx spectacle prescription;  CTL contact lenses; OD right eye; OS left eye; OU both eyes  XT exotropia; ET esotropia; PEK punctate epithelial keratitis; PEE punctate  epithelial erosions; DES dry eye syndrome; MGD meibomian gland dysfunction; ATs artificial tears; PFAT's preservative free artificial tears; [place] nuclear sclerotic cataract; PSC posterior subcapsular cataract; ERM epi-retinal membrane; PVD posterior vitreous detachment; RD retinal detachment; DM diabetes mellitus; DR diabetic retinopathy; NPDR non-proliferative diabetic retinopathy; PDR proliferative diabetic retinopathy; CSME clinically significant macular edema; DME diabetic macular edema; dbh dot blot hemorrhages; CWS cotton wool spot; POAG primary open angle glaucoma; C/D cup-to-disc ratio; HVF humphrey visual field; GVF goldmann visual field; OCT optical coherence tomography; IOP intraocular pressure; BRVO Branch retinal vein occlusion; CRVO central retinal vein occlusion; CRAO central  retinal artery occlusion; BRAO branch retinal artery occlusion; RT retinal tear; SB scleral buckle; PPV pars plana vitrectomy; VH Vitreous hemorrhage; PRP panretinal laser photocoagulation; IVK intravitreal kenalog; VMT vitreomacular traction; MH Macular hole;  NVD neovascularization of the disc; NVE neovascularization elsewhere; AREDS age related eye disease study; ARMD age related macular degeneration; POAG primary open angle glaucoma; EBMD epithelial/anterior basement membrane dystrophy; ACIOL anterior chamber intraocular lens; IOL intraocular lens; PCIOL posterior chamber intraocular lens; Phaco/IOL phacoemulsification with intraocular lens placement; [place] photorefractive keratectomy; LASIK laser assisted in situ keratomileusis; HTN hypertension; DM diabetes mellitus; COPD chronic obstructive pulmonary disease

## 2017-09-18 ENCOUNTER — Encounter (INDEPENDENT_AMBULATORY_CARE_PROVIDER_SITE_OTHER): Payer: Self-pay | Admitting: Ophthalmology

## 2017-09-18 ENCOUNTER — Ambulatory Visit (INDEPENDENT_AMBULATORY_CARE_PROVIDER_SITE_OTHER): Payer: Medicare Other | Admitting: Ophthalmology

## 2017-09-18 DIAGNOSIS — H35373 Puckering of macula, bilateral: Secondary | ICD-10-CM

## 2017-09-18 DIAGNOSIS — Z961 Presence of intraocular lens: Secondary | ICD-10-CM | POA: Diagnosis not present

## 2017-09-18 DIAGNOSIS — H43813 Vitreous degeneration, bilateral: Secondary | ICD-10-CM | POA: Diagnosis not present

## 2017-09-18 DIAGNOSIS — H35341 Macular cyst, hole, or pseudohole, right eye: Secondary | ICD-10-CM | POA: Diagnosis not present

## 2017-09-18 DIAGNOSIS — Z8669 Personal history of other diseases of the nervous system and sense organs: Secondary | ICD-10-CM

## 2017-09-18 DIAGNOSIS — H33312 Horseshoe tear of retina without detachment, left eye: Secondary | ICD-10-CM

## 2017-09-18 DIAGNOSIS — H3581 Retinal edema: Secondary | ICD-10-CM

## 2017-09-19 ENCOUNTER — Encounter (INDEPENDENT_AMBULATORY_CARE_PROVIDER_SITE_OTHER): Payer: Self-pay

## 2017-09-19 ENCOUNTER — Telehealth (INDEPENDENT_AMBULATORY_CARE_PROVIDER_SITE_OTHER): Payer: Self-pay

## 2017-09-19 MED ORDER — PREDNISOLONE ACETATE 1 % OP SUSP: 1.0000 [drp] | Freq: Four times a day (QID) | OPHTHALMIC | 0 refills | Status: DC

## 2017-09-19 NOTE — Telephone Encounter (Signed)
Pt called in stating there was no rx sent in to pharmacy; Will send PF to be used QID OS x 7 days in to [facility] [address] in [place];   [place], [place]

## 2017-09-20 NOTE — Telephone Encounter (Signed)
This encounter was created in error - please disregard.

## 2017-09-22 ENCOUNTER — Ambulatory Visit: Payer: Medicare Other | Admitting: General Practice

## 2017-09-22 DIAGNOSIS — Z7901 Long term (current) use of anticoagulants: Secondary | ICD-10-CM

## 2017-09-22 DIAGNOSIS — I4891 Unspecified atrial fibrillation: Secondary | ICD-10-CM

## 2017-09-22 LAB — POCT INR: INR: 2.7 (ref 2.0–3.0)

## 2017-09-22 NOTE — Patient Instructions (Addendum)
Pre visit review using our clinic review tool, if applicable. No additional management support is needed unless otherwise documented below in the visit note.  Continue to take 1/2 tablet daily except 1 tablet on Monday, Wednesday and Fridays.  Re-check in 6 weeks per patient request.

## 2017-09-25 ENCOUNTER — Other Ambulatory Visit: Payer: Self-pay | Admitting: Internal Medicine

## 2017-09-25 DIAGNOSIS — M1000 Idiopathic gout, unspecified site: Secondary | ICD-10-CM

## 2017-10-02 ENCOUNTER — Other Ambulatory Visit: Payer: Self-pay | Admitting: Internal Medicine

## 2017-10-06 NOTE — Progress Notes (Signed)
[facility] Note  10/10/2017     CHIEF COMPLAINT Patient presents for Post-op Follow-up   HISTORY OF PRESENT ILLNESS: Nathan [name] is a 77 y.o. male who presents to the clinic today for:   HPI    Post-op Follow-up    In left eye.  Discomfort includes none.  Negative for pain, itching, foreign body sensation, tearing, discharge and floaters.  Vision is stable.          Comments    77 y/o male pt here for 3 wk po s/p laser retinopexy OS.  No change in vision OU.  Denies pain, flashes, floaters.  No gtts.       Last edited by Bernarda Caffey, MD on 10/10/2017  4:57 PM. (History)      Referring physician: Janith Lima, MD [address] [place],  [address]  HISTORICAL INFORMATION:   Selected notes from the MEDICAL RECORD NUMBER Referred by Dr. Parke Simmers for concern of pseudohole OD and ERM OU LEE- 05.17.19 (K. Hecker) [BCVA OD: 20/30-2 OS: 20/25] Ocular Hx-  Pseudophakia OU, HTN retinopathy OU, non-exudative ARMD OU,  PMH- HTN, permanent A-Fib, chronic renal impairment, former smoker    CURRENT MEDICATIONS: Current Outpatient Medications (Ophthalmic Drugs)  Medication Sig  . prednisoLONE acetate (PRED FORTE) 1 % ophthalmic suspension Place 1 drop into the left eye 4 (four) times daily.   No current facility-administered medications for this visit.  (Ophthalmic Drugs)   Current Outpatient Medications (Other)  Medication Sig  . acetaminophen (TYLENOL) 650 MG CR tablet Take 650 mg by mouth every 8 (eight) hours as needed for pain.  Marland Kitchen allopurinol (ZYLOPRIM) 100 MG tablet TAKE 1 TABLET BY MOUTH ONCE DAILY  . allopurinol (ZYLOPRIM) 100 MG tablet TAKE 1 TABLET BY MOUTH ONCE DAILY  . Dermatological Products, Misc. [facility]) lotion Apply 1 Act topically 2 (two) times daily with a meal.  . diltiazem (CARDIZEM CD) 120 MG 24 hr capsule Take 1 capsule (120 mg total) by mouth daily.  . finasteride (PROSCAR) 5 MG tablet Take 5 mg by  mouth daily. Rx'ed by [name]  . furosemide (LASIX) 40 MG tablet Take 1 tablet (40 mg total) by mouth daily.  Marland Kitchen lisinopril (PRINIVIL,ZESTRIL) 40 MG tablet Take 1 tablet (40 mg total) by mouth daily.  . metoprolol tartrate (LOPRESSOR) 100 MG tablet Take 1 tablet (100 mg total) by mouth 2 (two) times daily.  Marland Kitchen warfarin (COUMADIN) 5 MG tablet TAKE AS DIRECTED BY COUMADIN CLINIC. 30 DAY SUPPLY   No current facility-administered medications for this visit.  (Other)      REVIEW OF SYSTEMS: ROS    Positive for: Eyes   Negative for: Constitutional, Gastrointestinal, Neurological, Skin, Genitourinary, Musculoskeletal, HENT, Endocrine, Cardiovascular, Respiratory, Psychiatric, Allergic/Imm, Heme/Lymph   Last edited by Matthew Folks, COA on 10/10/2017  1:47 PM. (History)       ALLERGIES Allergies  Allergen Reactions  . Tramadol Nausea And Vomiting    PAST MEDICAL HISTORY Past Medical History:  Diagnosis Date  . Cancer ([place])    skin  . Chronic renal impairment   . Diverticulosis   . DJD (degenerative joint disease)   . Elevated PSA    Dr Junious Silk  . H/O hiatal hernia   . Hypertension   . 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 Permanent atrial fibrillation ([place])   . PONV (postoperative nausea and vomiting)  no  problems recently   Past Surgical History:  Procedure Laterality Date  . COLONOSCOPY  2003  . HERNIA REPAIR    . HIATAL HERNIA REPAIR     Dr Kaylyn Lim , laparoscopic  . INGUINAL HERNIA REPAIR Left 03/05/2013   Procedure: HERNIA REPAIR INGUINAL ADULT;  Surgeon: [name], MD;  Location: [facility];  Service: General;  Laterality: Left;  . INSERTION OF MESH Left 03/05/2013   Procedure: INSERTION OF MESH;  Surgeon: [name], MD;  Location: [facility];  Service: General;  Laterality: Left;  . TOTAL HIP ARTHROPLASTY Right 07  . TOTAL KNEE ARTHROPLASTY Right 07    FAMILY HISTORY Family History   Problem Relation Age of Onset  . Hypertension Mother   . Skin cancer Mother   . Brain cancer Sister   . Hypertension Brother   . Diabetes Brother   . Stroke Neg Hx   . Heart disease Neg Hx     SOCIAL HISTORY Social History   Tobacco Use  . Smoking status: Former Smoker    Packs/day: 0.25    Years: 3.00    Pack years: 0.75    Types: Cigarettes    Last attempt to quit: 04/17/1991    Years since quitting: 26.5  . Smokeless tobacco: Never Used  Substance Use Topics  . Alcohol use: Yes    Alcohol/week: 1.2 oz    Types: 2 Glasses of wine per week    Comment: occasionally  . Drug use: No         OPHTHALMIC EXAM:  Base Eye Exam    Visual Acuity (Snellen - Linear)      Right Left   Dist cc 20/40 -2 20/20   Dist ph cc NI    Correction:  Glasses       Tonometry (Tonopen, 1:59 PM)      Right Left   Pressure 12 11       Pupils      Dark Light Shape React APD   Right 5 3 Round Brisk None   Left 5 3 Round Brisk None       Visual Fields (Counting fingers)      Left Right    Full Full       Extraocular Movement      Right Left    Full, Ortho Full, Ortho       Neuro/Psych    Oriented x3:  Yes   Mood/Affect:  Normal       Dilation    Left eye:  1.0% Mydriacyl, 2.5% Phenylephrine @ 1:59 PM        Slit Lamp and Fundus Exam    Slit Lamp Exam      Right Left   Lids/Lashes Dermatochalasis - upper lid, Telangiectasia, mild Meibomian gland dysfunction Dermatochalasis - upper lid, Meibomian gland dysfunction, Telangiectasia   Conjunctiva/Sclera Temporal Pinguecula Nasal pterygium   Cornea Inferior hemisphere 4+ Punctate epithelial erosions, irregular epithelial  surface, Arcus Nasal pterygium comes on 22m at 0900, iron line at head of pteryigia, Inferior 1-2+ Punctate epithelial erosions, Arcus   Anterior Chamber Deep and quiet Deep and quiet   Iris Round and dilated Round and dilated   Lens Three piece Posterior chamber intraocular lens in good position Three  piece Posterior chamber intraocular lens in good position   Vitreous Vitreous syneresis, Posterior vitreous detachment Vitreous syneresis, Posterior vitreous detachment       Fundus Exam      Right Left  Disc  Pink and Sharp, mild Peripapillary atrophy   C/D Ratio 0.3 0.35   Macula  Blunted foveal reflex, mild Retinal pigment epithelial mottling, No heme or edema   Vessels  Vascular attenuation, AV crossing changes   Periphery  Attached, operculated hole at 0600 - no SRF -- excellent laser changes surrounding, Reticular degeneration          IMAGING AND PROCEDURES  Imaging and Procedures for _0 @  OCT, Retina - OU - Both Eyes       Right Eye Quality was good. Central Foveal Thickness: 258. Progression has been stable. Findings include abnormal foveal contour, intraretinal fluid, no SRF, epiretinal membrane, retinal drusen , lamellar hole, outer retinal atrophy.   Left Eye Quality was good. Central Foveal Thickness: 255. Progression has been stable. Findings include normal foveal contour, no IRF, no SRF, epiretinal membrane.   Notes *Images captured and stored on drive  Diagnosis / Impression:  OD: mild ERM with lamellar hole -- stable from prior OS: NFP, No IRF/SRF, ERM -- stable from prior  Clinical management:  See below  Abbreviations: NFP - Normal foveal profile. CME - cystoid macular edema. PED - pigment epithelial detachment. IRF - intraretinal fluid. SRF - subretinal fluid. EZ - ellipsoid zone. ERM - epiretinal membrane. ORA - outer retinal atrophy. ORT - outer retinal tubulation. SRHM - subretinal hyper-reflective material                  ASSESSMENT/PLAN:    ICD-10-CM   1. Epiretinal membrane (ERM) of both eyes H35.373 OCT, Retina - OU - Both Eyes  2. 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 Retinal tear of left eye H33.312   4. Posterior vitreous detachment of both eyes H43.813   5. History of retinal tear Z86.69   6. Retinal edema  H35.81 OCT, Retina - OU - Both Eyes  7. Pseudophakia of both eyes Z96.1     1, 2. ERM OU (OD > OS) w/ lamellar hole OD-  The natural history, anatomy, potential for loss of vision, and treatment options including vitrectomy techniques and the complications of endophthalmitis, retinal detachment, vitreous hemorrhage, cataract progression and permanent vision loss discussed with the patient. - BCVA 20/40; asymptomatic; denies metamorphopsia; happy with vision OD since receiving new glasses - stable on repeat OCT - pt wishes to monitor for now - F/U 2-3 months  3. Retinal hole OS-   - lightly pigmented operculated hole at 6 oclock ant to equator - S/P laser retinopexy OS (06.03.19) -- good laser in place - finished PF QID OS x7 days - f/u in 6-8 wks for POV, DFE OU, OCT  4. PVD / vitreous syneresis OU  Discussed findings and prognosis  No other RT or RD on 360 scleral depressed exam  Reviewed s/s of RT/RD  Strict return precautions for any such RT/RD signs/symptoms  5. History of retinal holes OD-  - multiple laser scars with operculated hole at 0500 with excellent laser surrounding - done by Dr. Zigmund Daniel  - monitor  6. No retinal edema on exam or OCT  7. Pseudophakia OU  - s/p CE/IOL  - doing well  - monitor   Ophthalmic Meds Ordered this visit:  No orders of the defined types were placed in this encounter.      Return in about 8 weeks (around 12/05/2017) for F/U Ret Hole repair OS, ERM OU, DFE OU, OCT.  There are no Patient Instructions on file for this visit.  Explained the diagnoses, plan, and follow up with the patient and they expressed understanding.  Patient expressed understanding of the importance of proper follow up care.   This document serves as a record of services personally performed by Gardiner Sleeper, MD, PhD. It was created on their behalf by Catha Brow, [place], a certified ophthalmic assistant. The creation of this record is the provider's dictation  and/or activities during the visit.  Electronically signed by: Catha Brow, [place]  06.21.19 4:57 PM   Gardiner Sleeper, M.D., Ph.D. Diseases & Surgery of the Retina and Vitreous [facility]  I have reviewed the above documentation for accuracy and completeness, and I agree with the above. Gardiner Sleeper, M.D., Ph.D. 10/10/17 4:57 PM     Abbreviations: M myopia (nearsighted); A astigmatism; H hyperopia (farsighted); P presbyopia; Mrx spectacle prescription;  CTL contact lenses; OD right eye; OS left eye; OU both eyes  XT exotropia; ET esotropia; PEK punctate epithelial keratitis; PEE punctate epithelial erosions; DES dry eye syndrome; MGD meibomian gland dysfunction; ATs artificial tears; PFAT's preservative free artificial tears; [place] nuclear sclerotic cataract; PSC posterior subcapsular cataract; ERM epi-retinal membrane; PVD posterior vitreous detachment; RD retinal detachment; DM diabetes mellitus; DR diabetic retinopathy; NPDR non-proliferative diabetic retinopathy; PDR proliferative diabetic retinopathy; CSME clinically significant macular edema; DME diabetic macular edema; dbh dot blot hemorrhages; CWS cotton wool spot; POAG primary open angle glaucoma; C/D cup-to-disc ratio; HVF humphrey visual field; GVF goldmann visual field; OCT optical coherence tomography; IOP intraocular pressure; BRVO Branch retinal vein occlusion; CRVO central retinal vein occlusion; CRAO central retinal artery occlusion; BRAO branch retinal artery occlusion; RT retinal tear; SB scleral buckle; PPV pars plana vitrectomy; VH Vitreous hemorrhage; PRP panretinal laser photocoagulation; IVK intravitreal kenalog; VMT vitreomacular traction; MH Macular hole;  NVD neovascularization of the disc; NVE neovascularization elsewhere; AREDS age related eye disease study; ARMD age related macular degeneration; POAG primary open angle glaucoma; EBMD epithelial/anterior basement membrane dystrophy; ACIOL  anterior chamber intraocular lens; IOL intraocular lens; PCIOL posterior chamber intraocular lens; Phaco/IOL phacoemulsification with intraocular lens placement; [place] photorefractive keratectomy; LASIK laser assisted in situ keratomileusis; HTN hypertension; DM diabetes mellitus; COPD chronic obstructive pulmonary disease

## 2017-10-10 ENCOUNTER — Ambulatory Visit (INDEPENDENT_AMBULATORY_CARE_PROVIDER_SITE_OTHER): Payer: Medicare Other | Admitting: Ophthalmology

## 2017-10-10 ENCOUNTER — Encounter (INDEPENDENT_AMBULATORY_CARE_PROVIDER_SITE_OTHER): Payer: Self-pay | Admitting: Ophthalmology

## 2017-10-10 DIAGNOSIS — H33312 Horseshoe tear of retina without detachment, left eye: Secondary | ICD-10-CM

## 2017-10-10 DIAGNOSIS — H35373 Puckering of macula, bilateral: Secondary | ICD-10-CM

## 2017-10-10 DIAGNOSIS — H43813 Vitreous degeneration, bilateral: Secondary | ICD-10-CM

## 2017-10-10 DIAGNOSIS — H3581 Retinal edema: Secondary | ICD-10-CM

## 2017-10-10 DIAGNOSIS — Z961 Presence of intraocular lens: Secondary | ICD-10-CM

## 2017-10-10 DIAGNOSIS — H35341 Macular cyst, hole, or pseudohole, right eye: Secondary | ICD-10-CM

## 2017-10-10 DIAGNOSIS — Z8669 Personal history of other diseases of the nervous system and sense organs: Secondary | ICD-10-CM

## 2017-10-10 NOTE — Addendum Note (Signed)
Addended by: Gardiner Sleeper on: 10/10/2017 04:55 PM   Modules accepted: Orders

## 2017-10-24 ENCOUNTER — Other Ambulatory Visit: Payer: Self-pay | Admitting: Internal Medicine

## 2017-10-24 DIAGNOSIS — M1000 Idiopathic gout, unspecified site: Secondary | ICD-10-CM

## 2017-11-03 ENCOUNTER — Ambulatory Visit: Payer: Medicare Other | Admitting: General Practice

## 2017-11-03 DIAGNOSIS — I4891 Unspecified atrial fibrillation: Secondary | ICD-10-CM

## 2017-11-03 DIAGNOSIS — Z7901 Long term (current) use of anticoagulants: Secondary | ICD-10-CM | POA: Diagnosis not present

## 2017-11-03 LAB — POCT INR: INR: 4.3 — AB (ref 2.0–3.0)

## 2017-11-03 NOTE — Patient Instructions (Signed)
Pre visit review using our clinic review tool, if applicable. No additional management support is needed unless otherwise documented below in the visit note. 

## 2017-11-28 ENCOUNTER — Ambulatory Visit: Payer: Medicare Other | Admitting: General Practice

## 2017-11-28 DIAGNOSIS — Z7901 Long term (current) use of anticoagulants: Secondary | ICD-10-CM | POA: Diagnosis not present

## 2017-11-28 DIAGNOSIS — I4891 Unspecified atrial fibrillation: Secondary | ICD-10-CM

## 2017-11-28 LAB — POCT INR: INR: 2.3 (ref 2.0–3.0)

## 2017-11-28 NOTE — Patient Instructions (Addendum)
Pre visit review using our clinic review tool, if applicable. No additional management support is needed unless otherwise documented below in the visit note.  Continue to take 1/2 tablet daily except 1 tablet on Monday and Fridays.  Re-check in 4  weeks per patient request.

## 2017-12-04 NOTE — Progress Notes (Signed)
[facility] Note  12/05/2017     CHIEF COMPLAINT Patient presents for Retina Follow Up   HISTORY OF PRESENT ILLNESS: Nathan [name] is a 77 y.o. male who presents to the clinic today for:   HPI    Retina Follow Up    Patient presents with  Other.  In both eyes.  Severity is moderate.  Duration of 8 weeks.  Since onset it is stable.  I, the attending physician,  performed the HPI with the patient and updated documentation appropriately.          Comments    Pt presents for ERM OU f/u, pt states VA is "pretty decent", he denies FOL, pain, floaters and wavy vision, pt is using OTC gtts 2x a day for dryness       Last edited by Bernarda Caffey, MD on 12/05/2017  2:34 PM. (History)      Referring physician: Janith Lima, MD [address], [place] [address]  HISTORICAL INFORMATION:   Selected notes from the MEDICAL RECORD NUMBER Referred by Dr. Parke Simmers for concern of pseudohole OD and ERM OU LEE- 05.17.19 (K. Hecker) [BCVA OD: 20/30-2 OS: 20/25] Ocular Hx-  Pseudophakia OU, HTN retinopathy OU, non-exudative ARMD OU,  PMH- HTN, permanent A-Fib, chronic renal impairment, former smoker    CURRENT MEDICATIONS: Current Outpatient Medications (Ophthalmic Drugs)  Medication Sig  . prednisoLONE acetate (PRED FORTE) 1 % ophthalmic suspension Place 1 drop into the left eye 4 (four) times daily. (Patient not taking: Reported on 12/05/2017)   No current facility-administered medications for this visit.  (Ophthalmic Drugs)   Current Outpatient Medications (Other)  Medication Sig  . acetaminophen (TYLENOL) 650 MG CR tablet Take 650 mg by mouth every 8 (eight) hours as needed for pain.  Marland Kitchen allopurinol (ZYLOPRIM) 100 MG tablet TAKE 1 TABLET BY MOUTH ONCE DAILY  . allopurinol (ZYLOPRIM) 100 MG tablet TAKE 1 TABLET BY MOUTH ONCE DAILY  . Dermatological Products, Misc. [facility]) lotion Apply 1 Act topically 2 (two) times daily with a meal.   . diltiazem (CARDIZEM CD) 120 MG 24 hr capsule Take 1 capsule (120 mg total) by mouth daily.  . finasteride (PROSCAR) 5 MG tablet Take 5 mg by mouth daily. Rx'ed by [name]  . furosemide (LASIX) 40 MG tablet Take 1 tablet (40 mg total) by mouth daily.  Marland Kitchen lisinopril (PRINIVIL,ZESTRIL) 40 MG tablet Take 1 tablet (40 mg total) by mouth daily.  . metoprolol tartrate (LOPRESSOR) 100 MG tablet Take 1 tablet (100 mg total) by mouth 2 (two) times daily.  Marland Kitchen warfarin (COUMADIN) 5 MG tablet TAKE AS DIRECTED BY COUMADIN CLINIC. 30 DAY SUPPLY   No current facility-administered medications for this visit.  (Other)      REVIEW OF SYSTEMS: ROS    Positive for: Musculoskeletal, Cardiovascular, Eyes   Negative for: Constitutional, Gastrointestinal, Neurological, Skin, Genitourinary, HENT, Endocrine, Respiratory, Psychiatric, Allergic/Imm, Heme/Lymph   Last edited by Debbrah Alar, COT on 12/05/2017  1:38 PM. (History)       ALLERGIES Allergies  Allergen Reactions  . Tramadol Nausea And Vomiting    PAST MEDICAL HISTORY Past Medical History:  Diagnosis Date  . Cancer ([place])    skin  . Chronic renal impairment   . Diverticulosis   . DJD (degenerative joint disease)   . Elevated PSA    Dr Junious Silk  . H/O hiatal hernia   . Hypertension   . Infected prosthetic knee joint (  [place])    h/o Group B streptococcal prosthetic knee infection  . Permanent atrial fibrillation ([place])   . PONV (postoperative nausea and vomiting)    no  problems recently   Past Surgical History:  Procedure Laterality Date  . COLONOSCOPY  2003  . HERNIA REPAIR    . HIATAL HERNIA REPAIR     Dr Kaylyn Lim , laparoscopic  . INGUINAL HERNIA REPAIR Left 03/05/2013   Procedure: HERNIA REPAIR INGUINAL ADULT;  Surgeon: [name], MD;  Location: [place];  Service: General;  Laterality: Left;  . INSERTION OF MESH Left 03/05/2013   Procedure: INSERTION OF MESH;  Surgeon: [name], MD;  Location: [place];   Service: General;  Laterality: Left;  . TOTAL HIP ARTHROPLASTY Right 07  . TOTAL KNEE ARTHROPLASTY Right 07    FAMILY HISTORY Family History  Problem Relation Age of Onset  . Hypertension Mother   . Skin cancer Mother   . Brain cancer Sister   . Hypertension Brother   . Diabetes Brother   . Stroke Neg Hx   . Heart disease Neg Hx     SOCIAL HISTORY Social History   Tobacco Use  . Smoking status: Former Smoker    Packs/day: 0.25    Years: 3.00    Pack years: 0.75    Types: Cigarettes    Last attempt to quit: 04/17/1991    Years since quitting: 26.6  . Smokeless tobacco: Never Used  Substance Use Topics  . Alcohol use: Yes    Alcohol/week: 2.0 standard drinks    Types: 2 Glasses of wine per week    Comment: occasionally  . Drug use: No         OPHTHALMIC EXAM:  Base Eye Exam    Visual Acuity (Snellen - Linear)      Right Left   Dist cc 20/50 -1 20/25   Dist ph cc 20/40 -2 20/25 +2   Correction:  Glasses       Tonometry (Tonopen, 1:45 PM)      Right Left   Pressure 10 9       Pupils      Dark Light Shape React APD   Right 5 3 Round Brisk None   Left 5 3 Round Brisk None       Visual Fields (Counting fingers)      Left Right    Full Full       Extraocular Movement      Right Left    Full, Ortho Full, Ortho       Neuro/Psych    Oriented x3:  Yes   Mood/Affect:  Normal       Dilation    Both eyes:  1.0% Mydriacyl, 2.5% Phenylephrine @ 1:45 PM        Slit Lamp and Fundus Exam    Slit Lamp Exam      Right Left   Lids/Lashes Dermatochalasis - upper lid, Telangiectasia, mild Meibomian gland dysfunction Dermatochalasis - upper lid, Meibomian gland dysfunction, Telangiectasia   Conjunctiva/Sclera Temporal and nasal Pinguecula Nasal pterygium, Temporal Pinguecula   Cornea Inferior hemisphere 3+ Punctate epithelial erosions, irregular epithelial  surface, Arcus Nasal pterygium comes on 55mm at 0900, iron line at head of pteryigia, Inferior 2+  Punctate epithelial erosions, Arcus   Anterior Chamber Deep and quiet Deep and quiet   Iris Round and dilated Round and dilated   Lens Three piece Posterior chamber intraocular lens in good position, open PC Three  piece Posterior chamber intraocular lens in good position   Vitreous Vitreous syneresis, Posterior vitreous detachment Vitreous syneresis, Posterior vitreous detachment, vitreous condensations       Fundus Exam      Right Left   Disc 360 Peripapillary atrophy, Pink and Sharp Pink and Sharp, temporal Peripapillary atrophy, mild Pallor   C/D Ratio 0.4 0.4   Macula Grossly flat, Blunted foveal reflex, Epiretinal membrane, RPE mottling and clumping, Drusen, No heme or edema Blunted foveal reflex, mild Retinal pigment epithelial mottling, Epiretinal membrane, No heme or edema   Vessels AV crossing changes, Vascular attenuation Vascular attenuation, AV crossing changes   Periphery Attached, laser scars at 0430, opercutalted hole at 0500 with good surrounding laser, laser scars at 0400, laser scars at 0130, Reticular degeneration Attached, operculated hole at 0600 - no SRF -- excellent laser changes surrounding, Reticular degeneration   Poor view in right eye          IMAGING AND PROCEDURES  Imaging and Procedures for @TODAY @  OCT, Retina - OU - Both Eyes       Right Eye Quality was good. Central Foveal Thickness: 261. Progression has been stable. Findings include abnormal foveal contour, intraretinal fluid, no SRF, epiretinal membrane, retinal drusen , lamellar hole, outer retinal atrophy.   Left Eye Quality was good. Central Foveal Thickness: 254. Progression has been stable. Findings include normal foveal contour, no IRF, no SRF, epiretinal membrane.   Notes *Images captured and stored on drive  Diagnosis / Impression:  OD: mild ERM with lamellar hole -- stable from prior OS: NFP, No IRF/SRF, ERM -- stable from prior  Clinical management:  See below  Abbreviations:  NFP - Normal foveal profile. CME - cystoid macular edema. PED - pigment epithelial detachment. IRF - intraretinal fluid. SRF - subretinal fluid. EZ - ellipsoid zone. ERM - epiretinal membrane. ORA - outer retinal atrophy. ORT - outer retinal tubulation. SRHM - subretinal hyper-reflective material                  ASSESSMENT/PLAN:    ICD-10-CM   1. Epiretinal membrane (ERM) of both eyes H35.373 OCT, Retina - OU - Both Eyes  2. 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 Retinal tear of left eye H33.312   4. Posterior vitreous detachment of both eyes H43.813   5. History of retinal tear Z86.69   6. Retinal edema H35.81 OCT, Retina - OU - Both Eyes  7. Pseudophakia of both eyes Z96.1     1,2. ERM OU (OD > OS) w/ lamellar hole OD-  The natural history, anatomy, potential for loss of vision, and treatment options including vitrectomy techniques and the complications of endophthalmitis, retinal detachment, vitreous hemorrhage, cataract progression and permanent vision loss discussed with the patient. - BCVA 20/40; asymptomatic; denies metamorphopsia; happy with vision OD since receiving new glasses - stable on repeat OCT - pt wishes to continue monitoring for now - F/U 3-4 months  3. Retinal hole OS-   - lightly pigmented operculated hole at 6 oclock ant to equator - S/P laser retinopexy OS (06.03.19) -- excellent laser in place - no new RT/RD  4. PVD / vitreous syneresis OU  Discussed findings and prognosis  No other RT or RD on 360 scleral depressed exam  Reviewed s/s of RT/RD  Strict return precautions for any such RT/RD signs/symptoms  5. History of retinal holes OD-  - multiple laser scars with operculated hole at 0500 with excellent laser surrounding -  done by Dr. Zigmund Daniel  - monitor  6. No retinal edema on exam or OCT  7. Pseudophakia OU  - s/p CE/IOL  - doing well  - monitor   Ophthalmic Meds Ordered this visit:  No orders of the defined types were placed  in this encounter.      Return in about 4 months (around 04/06/2018) for F/U ERM OU, DFE, OCT.  There are no Patient Instructions on file for this visit.   Explained the diagnoses, plan, and follow up with the patient and they expressed understanding.  Patient expressed understanding of the importance of proper follow up care.   This document serves as a record of services personally performed by Gardiner Sleeper, MD, PhD. It was created on their behalf by Ernest Mallick, OA, an ophthalmic assistant. The creation of this record is the provider's dictation and/or activities during the visit.    Electronically signed by: Ernest Mallick, OA  08.19.2019 3:16 PM    Gardiner Sleeper, M.D., Ph.D. Diseases & Surgery of the Retina and Vitreous [facility]   I have reviewed the above documentation for accuracy and completeness, and I agree with the above. Gardiner Sleeper, M.D., Ph.D. 12/05/17 3:16 PM     Abbreviations: M myopia (nearsighted); A astigmatism; H hyperopia (farsighted); P presbyopia; Mrx spectacle prescription;  CTL contact lenses; OD right eye; OS left eye; OU both eyes  XT exotropia; ET esotropia; PEK punctate epithelial keratitis; PEE punctate epithelial erosions; DES dry eye syndrome; MGD meibomian gland dysfunction; ATs artificial tears; PFAT's preservative free artificial tears; [place] nuclear sclerotic cataract; PSC posterior subcapsular cataract; ERM epi-retinal membrane; PVD posterior vitreous detachment; RD retinal detachment; DM diabetes mellitus; DR diabetic retinopathy; NPDR non-proliferative diabetic retinopathy; PDR proliferative diabetic retinopathy; CSME clinically significant macular edema; DME diabetic macular edema; dbh dot blot hemorrhages; CWS cotton wool spot; POAG primary open angle glaucoma; C/D cup-to-disc ratio; HVF humphrey visual field; GVF goldmann visual field; OCT optical coherence tomography; IOP intraocular pressure; BRVO Branch retinal  vein occlusion; CRVO central retinal vein occlusion; CRAO central retinal artery occlusion; BRAO branch retinal artery occlusion; RT retinal tear; SB scleral buckle; PPV pars plana vitrectomy; VH Vitreous hemorrhage; PRP panretinal laser photocoagulation; IVK intravitreal kenalog; VMT vitreomacular traction; MH Macular hole;  NVD neovascularization of the disc; NVE neovascularization elsewhere; AREDS age related eye disease study; ARMD age related macular degeneration; POAG primary open angle glaucoma; EBMD epithelial/anterior basement membrane dystrophy; ACIOL anterior chamber intraocular lens; IOL intraocular lens; PCIOL posterior chamber intraocular lens; Phaco/IOL phacoemulsification with intraocular lens placement; [place] photorefractive keratectomy; LASIK laser assisted in situ keratomileusis; HTN hypertension; DM diabetes mellitus; COPD chronic obstructive pulmonary disease

## 2017-12-05 ENCOUNTER — Encounter (INDEPENDENT_AMBULATORY_CARE_PROVIDER_SITE_OTHER): Payer: Self-pay | Admitting: Ophthalmology

## 2017-12-05 ENCOUNTER — Ambulatory Visit (INDEPENDENT_AMBULATORY_CARE_PROVIDER_SITE_OTHER): Payer: Medicare Other | Admitting: Ophthalmology

## 2017-12-05 ENCOUNTER — Other Ambulatory Visit: Payer: Self-pay | Admitting: Internal Medicine

## 2017-12-05 DIAGNOSIS — H3581 Retinal edema: Secondary | ICD-10-CM | POA: Diagnosis not present

## 2017-12-05 DIAGNOSIS — H43813 Vitreous degeneration, bilateral: Secondary | ICD-10-CM

## 2017-12-05 DIAGNOSIS — Z8669 Personal history of other diseases of the nervous system and sense organs: Secondary | ICD-10-CM

## 2017-12-05 DIAGNOSIS — H35341 Macular cyst, hole, or pseudohole, right eye: Secondary | ICD-10-CM

## 2017-12-05 DIAGNOSIS — Z961 Presence of intraocular lens: Secondary | ICD-10-CM

## 2017-12-05 DIAGNOSIS — H35373 Puckering of macula, bilateral: Secondary | ICD-10-CM

## 2017-12-05 DIAGNOSIS — H33312 Horseshoe tear of retina without detachment, left eye: Secondary | ICD-10-CM

## 2017-12-26 ENCOUNTER — Other Ambulatory Visit: Payer: Self-pay | Admitting: Internal Medicine

## 2017-12-26 ENCOUNTER — Ambulatory Visit: Payer: Medicare Other | Admitting: General Practice

## 2017-12-26 DIAGNOSIS — Z7901 Long term (current) use of anticoagulants: Secondary | ICD-10-CM | POA: Diagnosis not present

## 2017-12-26 DIAGNOSIS — I4891 Unspecified atrial fibrillation: Secondary | ICD-10-CM

## 2017-12-26 LAB — POCT INR: INR: 1.5 — AB (ref 2.0–3.0)

## 2017-12-26 NOTE — Patient Instructions (Addendum)
Pre visit review using our clinic review tool, if applicable. No additional management support is needed unless otherwise documented below in the visit note.  Take 1 tablet today and tomorrow (9/10 and 9/11) and then change dosage back to 1/2 tablet daily except 1 tablet on Monday/Wed and Fridays.  Re-check in 3  weeks per patient request.

## 2017-12-29 ENCOUNTER — Other Ambulatory Visit: Payer: Self-pay | Admitting: Internal Medicine

## 2017-12-29 NOTE — Telephone Encounter (Signed)
Outpatient Medication Detail    Disp Refills Start End   diltiazem (CARDIZEM CD) 120 MG 24 hr capsule 30 capsule 1 12/26/2017    Sig - Route: Take 1 capsule (120 mg total) by mouth daily. Please make yearly appt with Dr. Rayann Heman for October before anymore refills. 1st attempt - Oral   Sent to pharmacy as: diltiazem (CARDIZEM CD) 120 MG 24 hr capsule   Notes to Pharmacy: Please call our office to schedule an yearly appointment with Dr. Rayann Heman before anymore refills. [phone]. Thank you 1st attempt   E-Prescribing Status: Receipt confirmed by pharmacy (12/26/2017 11:37 AM EDT)   Pharmacy   [id number] - [place], [place]

## 2018-01-16 ENCOUNTER — Ambulatory Visit: Payer: Medicare Other | Admitting: General Practice

## 2018-01-16 DIAGNOSIS — Z7901 Long term (current) use of anticoagulants: Secondary | ICD-10-CM | POA: Diagnosis not present

## 2018-01-16 DIAGNOSIS — Z23 Encounter for immunization: Secondary | ICD-10-CM

## 2018-01-16 LAB — POCT INR: INR: 2.8 (ref 2.0–3.0)

## 2018-01-16 NOTE — Patient Instructions (Addendum)
Pre visit review using our clinic review tool, if applicable. No additional management support is needed unless otherwise documented below in the visit note.  Continue to take 1/2 tablet daily except 1 tablet on Monday/Wed and Fridays.  Re-check in 4 to 5  weeks per patient request.  

## 2018-01-21 ENCOUNTER — Other Ambulatory Visit: Payer: Self-pay | Admitting: Internal Medicine

## 2018-01-21 DIAGNOSIS — M1000 Idiopathic gout, unspecified site: Secondary | ICD-10-CM

## 2018-01-22 ENCOUNTER — Other Ambulatory Visit: Payer: Self-pay | Admitting: Internal Medicine

## 2018-01-22 DIAGNOSIS — M1000 Idiopathic gout, unspecified site: Secondary | ICD-10-CM

## 2018-02-01 IMAGING — MR MR 3D RECON AT [facility]
9 of 21 series · 21 of 48 positions shown · IV contrast (multihance)
Comparison: MRI abdomen dated 05/08/2015. CT abdomen/ pelvis dated
01/22/2015.

CLINICAL DATA: Annual follow-up for known renal and pancreatic
cysts

EXAM:
MRI ABDOMEN WITHOUT AND WITH CONTRAST
TECHNIQUE: Multiplanar multisequence MR imaging of the abdomen was performed
both before and after the administration of intravenous contrast.
CONTRAST:  20mL MULTIHANCE GADOBENATE DIMEGLUMINE 529 MG/ML IV SOLN

[Series 3: T2 fat-sat · axial · 5.0mm · 0.78mm/px · z∈[-73,+222]mm · 2 of 60 slices shown]
[im 1/60]
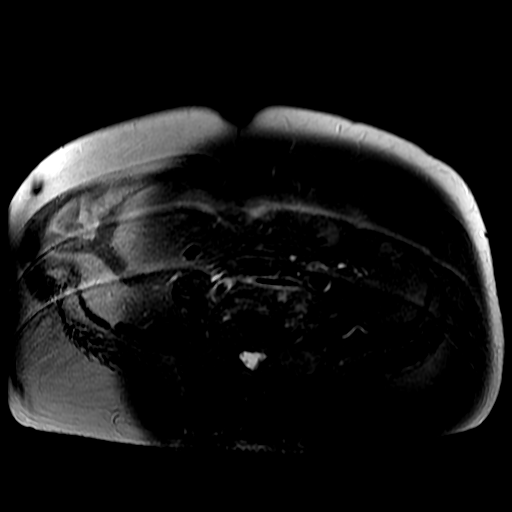
[im 60/60]
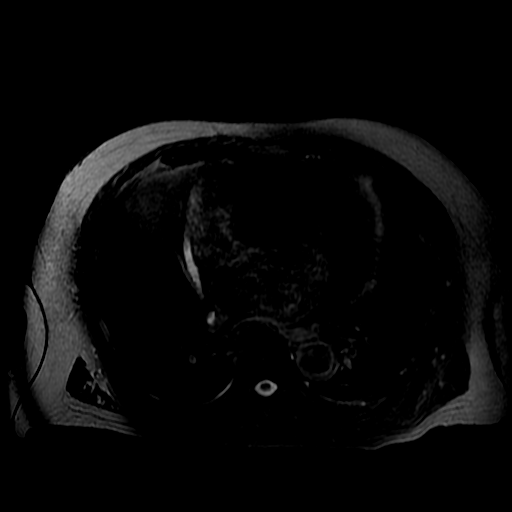

[Series 4: DWI b500 · axial · 6.0mm · 1.48mm/px · z∈[-82,+230]mm · 3 of 82 slices shown]
[im 1/82]
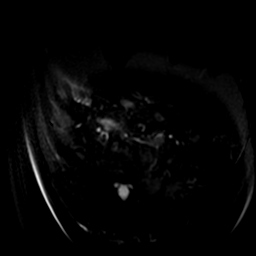
[im 41/82]
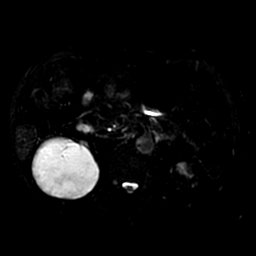
[im 82/82]
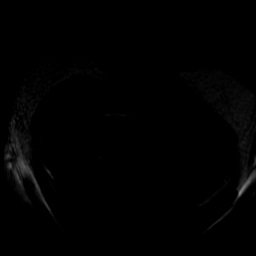

[Series 6: ax dualecho · axial · 5.0mm · 0.78mm/px · z∈[-57,+223]mm · 4 of 114 slices shown]
[im 1/114]
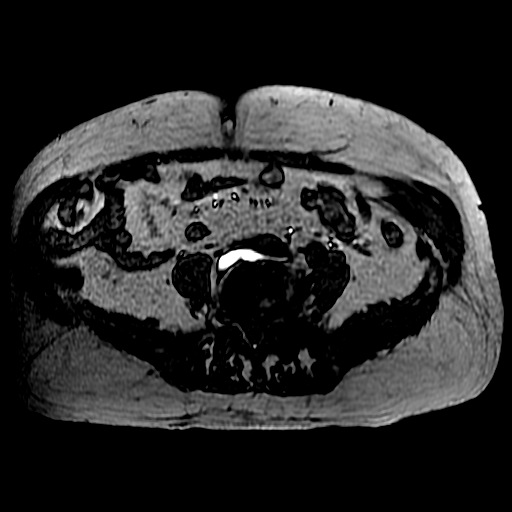
[im 38/114]
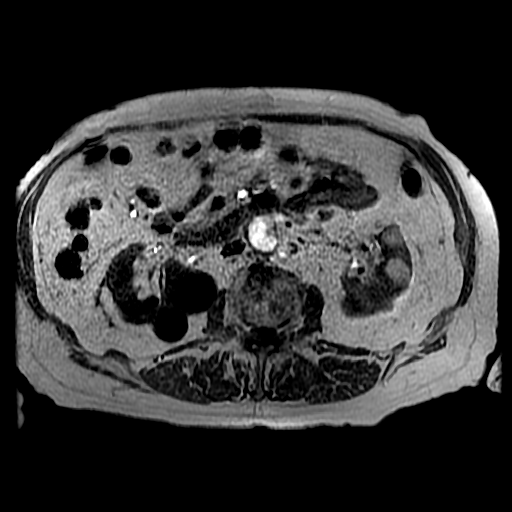
[im 76/114]
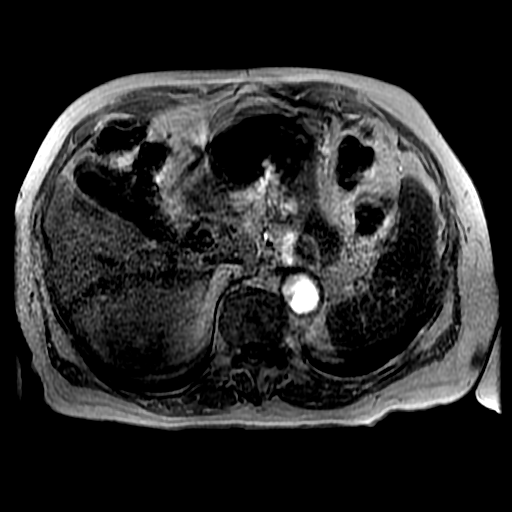
[im 114/114]
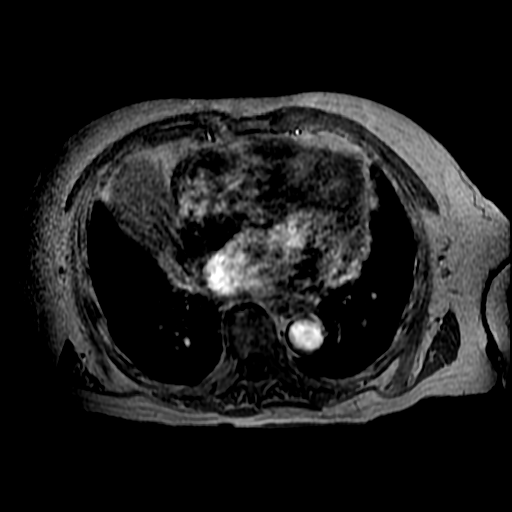

[Series 7: T2 · axial · 5.0mm · 0.78mm/px · z∈[-65,+220]mm · 2 of 58 slices shown (1 of 2)]
[im 1/58]
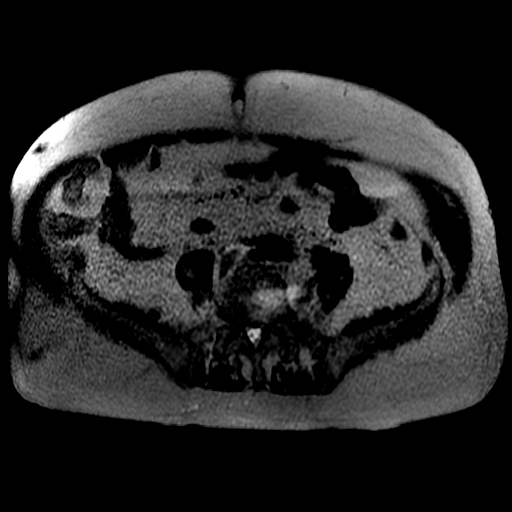
[im 58/58]
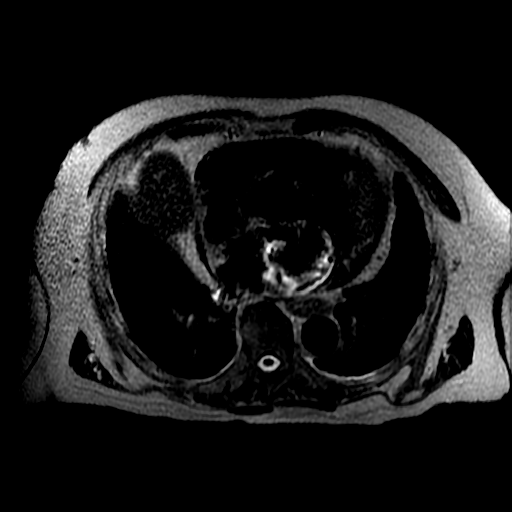

[Series 8: bSSFP · coronal · 5.0mm · 0.78mm/px · 1 of 54 slices shown]
[im 1/54]
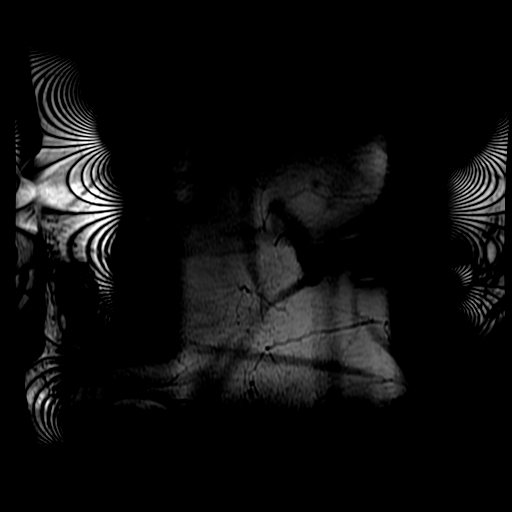

[Series 9: T2 · coronal · 5.0mm · 0.78mm/px · 1 of 54 slices shown (2 of 2)]
[im 1/54]
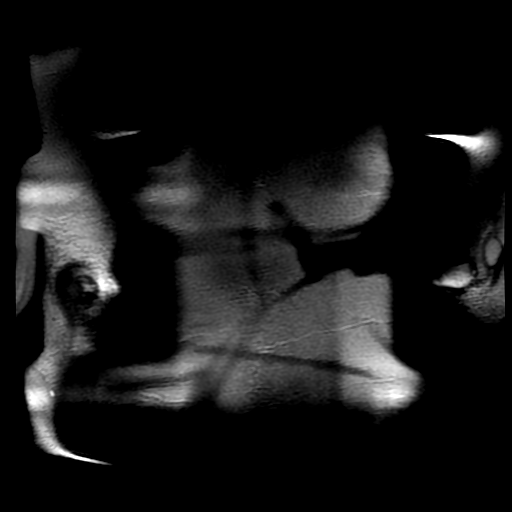

[Series 400: DWI · axial · 6.0mm · 1.48mm/px · 1 of 41 slices shown]
[im 1/41]
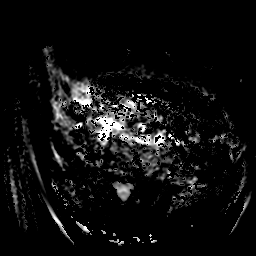

[Series 500: reformatted · axial · 1.6mm · 0.62mm/px · z∈[+70,+200]mm · 4 of 149 slices shown (1 of 2)]
[im 1/149]
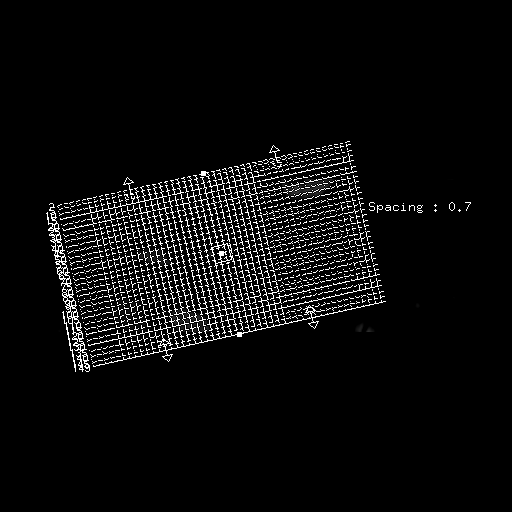
[im 50/149]
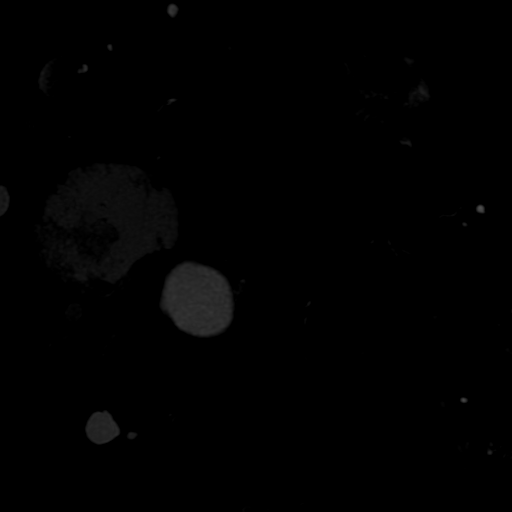
[im 99/149]
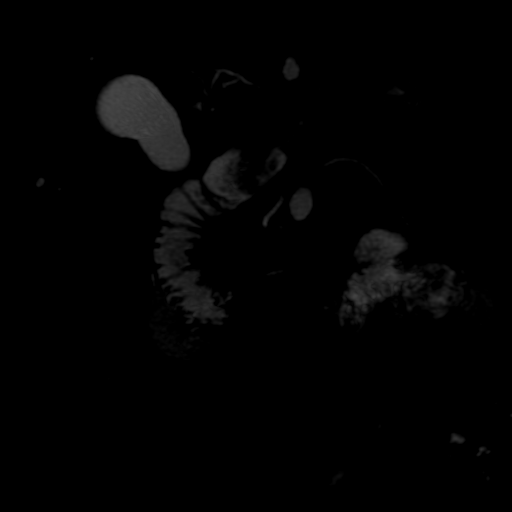
[im 149/149]
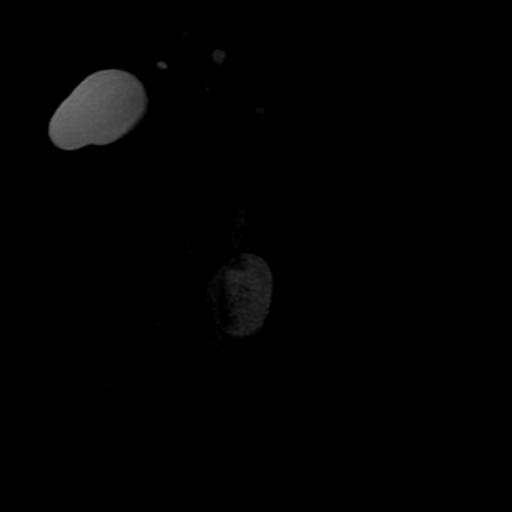

[Series 501: reformatted · coronal · 100.0mm · 0.51mm/px · 3 of 180 slices shown (2 of 2)]
[im 1/180]
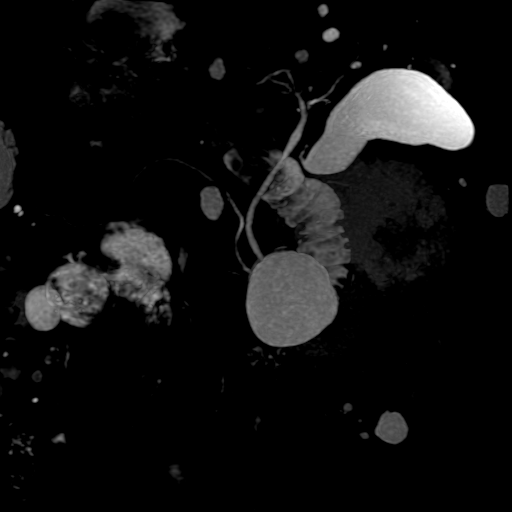
[im 60/180]
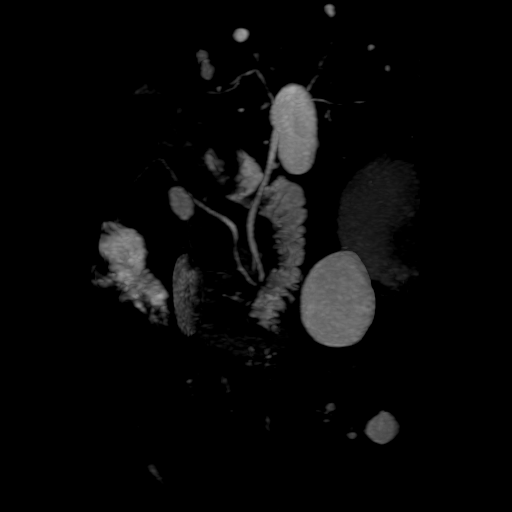
[im 120/180]
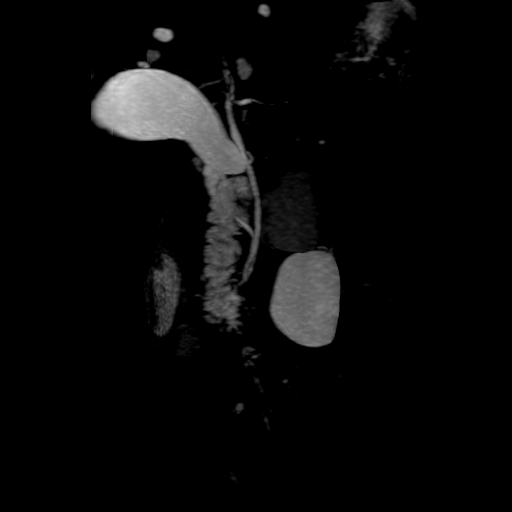

[21 of 48 positions shown; findings below may reference images not displayed]

FINDINGS: Lower chest: Lung bases are clear.  Cardiomegaly.

Hepatobiliary: Multiple hepatic cysts, measuring up to 3.3 cm in
segment 2 (series 7/ image 13). No suspicious/enhancing hepatic
lesions.

Gallbladder is unremarkable. No intrahepatic or extrahepatic ductal
dilatation.

Pancreas: 13 mm nonenhancing unilocular cyst in the pancreatic head
(series 7/ image 27). No associated main pancreatic ductal
dilatation. No pancreatic atrophy.

Spleen: Splenic cysts, including a dominant 5.2 cm cyst in the
splenic hilum (series 7/ image 25), without enhancement.

Adrenals/Urinary Tract:  Adrenal glands are within normal limits.

Multiple bilateral renal cysts of varying sizes and complexities,
many of which are hemorrhagic, including a dominant 10.7 cm
exophytic hemorrhagic cyst along the right upper kidney (series
5888/ image 53). None of these cysts demonstrate enhancement
following contrast administration.

No hydronephrosis.

Stomach/Bowel: Stomach is notable for a small hiatal hernia.

Visualized bowel is unremarkable.

Vascular/Lymphatic: No evidence of abdominal aortic aneurysm. Mild
ectasia of the infrarenal abdominal aorta measuring up to 3.0 cm
(series 0009/ image 81).

No suspicious abdominal lymphadenopathy.

Other:  No abdominal ascites.

Musculoskeletal: No focal osseous lesions.
IMPRESSION: 13 mm nonenhancing cyst in the pancreatic head, favored to reflect a
pseudocyst or side branch IPMN, unchanged. Given the patient's
current age, at least one additional follow-up study is suggested in
2 years. This recommendation follows ACR consensus guidelines:
Management of Incidental Pancreatic Cysts: A White Paper of the ACR
Incidental Findings Committee. [HOSPITAL] 3806;[DATE].

Multiple bilateral simple and hemorrhagic renal cysts, without
enhancement, benign (Bosniak I-II).

Multiple hepatic and splenic cysts, benign.

Additional ancillary findings as above.

## 2018-02-12 ENCOUNTER — Other Ambulatory Visit: Payer: Self-pay | Admitting: Internal Medicine

## 2018-02-13 ENCOUNTER — Ambulatory Visit: Payer: Medicare Other

## 2018-02-13 ENCOUNTER — Other Ambulatory Visit: Payer: Self-pay | Admitting: Internal Medicine

## 2018-02-18 NOTE — Progress Notes (Signed)
Cardiology Office Note Date:  02/20/2018  Patient ID:  Nathan, [name] January 07, 1941, MRN [id number] PCP:  Janith Lima, MD  Electrophysiologist: Dr. Rayann Heman   Chief Complaint: annual EP visit  History of Present Illness: Nathan [name] is a 77 y.o. male with history of CKD (III), HTN, gout, HLD, and AFib.  He comes today to be seen for Dr. Rayann Heman, last seen by him, Oct 2018.  At that time doing well with rate controlled AFib, planned for annual APP visits, no changes were made to his therapy.  He comes today ambulating with a walker, accompanied by a friend.  He recently went out target shooting with his son and did not wear ear protection, has had trouble with his hearing since, he does not appreciate one side better or worse.  No ear pain, no bleeding, no ringing in his ears.  Cardiac-wise, he feels very good.  Tells me he has never been aware of his irregular heart beat.  No CP or SOB, no DOE,.  He has arthritis "head to toe" and this limits his ability to do much physically, but gets around and is doing well. His warfarin is managed by his PMD office, denies any trouble with control, no bleeding or signs of bleeding.  He denies any falls, no near syncope or syncope.  His PMD does labs, is due to see him soon   Past Medical History:  Diagnosis Date  . Cancer ([place])    skin  . Chronic renal impairment   . Diverticulosis   . DJD (degenerative joint disease)   . Elevated PSA    Dr Junious Silk  . H/O hiatal hernia   . Hypertension   . Infected prosthetic knee joint ([place])    h/o Group B streptococcal prosthetic knee infection  . Permanent atrial fibrillation ([place])   . PONV (postoperative nausea and vomiting)    no  problems recently    Past Surgical History:  Procedure Laterality Date  . COLONOSCOPY  2003  . HERNIA REPAIR    . HIATAL HERNIA REPAIR     Dr Kaylyn Lim , laparoscopic  . INGUINAL HERNIA REPAIR Left 03/05/2013   Procedure: HERNIA REPAIR INGUINAL ADULT;   Surgeon: [name], MD;  Location: [facility];  Service: General;  Laterality: Left;  . INSERTION OF MESH Left 03/05/2013   Procedure: INSERTION OF MESH;  Surgeon: [name], MD;  Location: [place];  Service: General;  Laterality: Left;  . TOTAL HIP ARTHROPLASTY Right 07  . TOTAL KNEE ARTHROPLASTY Right 07    Current Outpatient Medications  Medication Sig Dispense Refill  . acetaminophen (TYLENOL) 650 MG CR tablet Take 650 mg by mouth every 8 (eight) hours as needed for pain.    Marland Kitchen allopurinol (ZYLOPRIM) 100 MG tablet TAKE 1 TABLET BY MOUTH ONCE DAILY 90 tablet 0  . Dermatological Products, Misc. [facility]) lotion Apply 1 Act topically 2 (two) times daily with a meal. 100 g 11  . diltiazem (CARDIZEM CD) 120 MG 24 hr capsule Take 1 capsule (120 mg total) by mouth daily. Please make yearly appt with Dr. Rayann Heman for October before anymore refills. 1st attempt 30 capsule 1  . finasteride (PROSCAR) 5 MG tablet Take 5 mg by mouth daily. Rx'ed by [name]    . furosemide (LASIX) 40 MG tablet Take 1 tablet (40 mg total) by mouth daily. 90 tablet 3  . lisinopril (PRINIVIL,ZESTRIL) 40 MG tablet Take 1 tablet (40 mg total) by mouth daily.  Please keep upcoming appt in November for future refills. Thank you 90 tablet 0  . metoprolol tartrate (LOPRESSOR) 100 MG tablet Take 1 tablet (100 mg total) by mouth 2 (two) times daily. Please keep upcoming appt in November for future refills. Thank you 180 tablet 0  . warfarin (COUMADIN) 5 MG tablet TAKE AS DIRECTED BY COUMADIN CLINIC. 30 DAY SUPPLY 30 tablet 2   No current facility-administered medications for this visit.     Allergies:   Tramadol   Social History:  The patient  reports that he quit smoking about 26 years ago. His smoking use included cigarettes. He has a 0.75 pack-year smoking history. He has never used smokeless tobacco. He reports that he drinks about 2.0 standard drinks of alcohol per week. He reports that he does not use drugs.    Family History:  The patient's family history includes Brain cancer in his sister; Diabetes in his brother; Hypertension in his brother and mother; Skin cancer in his mother.  ROS:  Please see the history of present illness. All other systems are reviewed and otherwise negative.   PHYSICAL EXAM:  VS:  BP 128/70   Pulse 81   Ht 6' (1.829 m)   Wt 207 lb (93.9 kg)   BMI 28.07 kg/m  BMI: Body mass index is 28.07 kg/m. Well nourished, well developed, in no acute distress  HEENT: normocephalic, atraumatic  Neck: no JVD, carotid bruits or masses Cardiac:  Irreg-irreg; no significant murmurs, no rubs, or gallops Lungs:  CTA b/l, no wheezing, rhonchi or rales  Abd: soft, nontender MS: no deformity, age appropriate atrophy Ext: trace edema  Skin: warm and dry, no rash Neuro:  No gross deficits appreciated Psych: euthymic mood, full affect    EKG:  Done today and reviewed by myself AFib 81bpm  06/03/15: TTE Study Conclusions - Left ventricle: The cavity size was normal. Wall thickness was   increased in a pattern of mild LVH. There was focal basal   hypertrophy. Systolic function was normal. The estimated ejection   fraction was in the range of 50% to 55%. - Aortic valve: There was mild regurgitation. - Mitral valve: There was mild to moderate regurgitation. - Left atrium: The atrium was moderately dilated. - Right atrium: The atrium was mildly dilated. - Pulmonary arteries: Systolic pressure was mildly increased. PA   peak pressure: 32 mm Hg (S).   Recent Labs: 03/20/2017: ALT 9; BUN 27; Creatinine, Ser 1.55; Hemoglobin 13.9; Platelets 271.0; Potassium 4.4; Sodium 145; TSH 1.97  03/20/2017: Cholesterol 149; HDL 45.90; LDL Cholesterol 76; Total CHOL/HDL Ratio 3; Triglycerides 137.0; VLDL 27.4   CrCl cannot be calculated (Patient's most recent lab result is older than the maximum 21 days allowed.).   Wt Readings from Last 3 Encounters:  02/20/18 207 lb (93.9 kg)  03/20/17 214  lb (97.1 kg)  01/23/17 215 lb 6.4 oz (97.7 kg)     Other studies reviewed: Additional studies/records reviewed today include: summarized above  ASSESSMENT AND PLAN:  1. Permanent AFib     CHA2DS2Vasc is 3, on Warfarin, monitored and managed with      Asymptomatic, rate controlled  2. HTN     Looks OK, no changes  3. Diminished hearing after the shooting range     He is instructed to f/u/see his PMD   Disposition: F/u with [place] in on year, sooner if needed  Current medicines are reviewed at length with the patient today.  The patient did not have any  concerns regarding medicines.  Venetia Night, PA-C 02/20/2018 10:50 AM     [facility] [address] [place] [address] [phone] (office)  [phone] (fax)

## 2018-02-20 ENCOUNTER — Ambulatory Visit: Payer: Medicare Other | Admitting: Physician Assistant

## 2018-02-20 ENCOUNTER — Encounter (INDEPENDENT_AMBULATORY_CARE_PROVIDER_SITE_OTHER): Payer: Self-pay

## 2018-02-20 VITALS — BP 128/70 | HR 81 | Ht 72.0 in | Wt 207.0 lb

## 2018-02-20 DIAGNOSIS — H918X9 Other specified hearing loss, unspecified ear: Secondary | ICD-10-CM

## 2018-02-20 DIAGNOSIS — I10 Essential (primary) hypertension: Secondary | ICD-10-CM

## 2018-02-20 DIAGNOSIS — I4821 Permanent atrial fibrillation: Secondary | ICD-10-CM | POA: Diagnosis not present

## 2018-02-20 MED ORDER — DILTIAZEM HCL ER COATED BEADS 120 MG PO CP24: 120.0000 mg | ORAL_CAPSULE | Freq: Every day | ORAL | 2 refills | Status: DC

## 2018-02-20 MED ORDER — LISINOPRIL 40 MG PO TABS: 40.0000 mg | ORAL_TABLET | Freq: Every day | ORAL | 2 refills | Status: DC

## 2018-02-20 NOTE — Patient Instructions (Addendum)
Medication Instructions:   NONE ORDERED  TODAY  If you need a refill on your cardiac medications before your next appointment, please call your pharmacy.   Lab work: NONE ORDERED  TODAY   If you have labs (blood work) drawn today and your tests are completely normal, you will receive your results only by: Marland Kitchen MyChart Message (if you have MyChart) OR . A paper copy in the mail If you have any lab test that is abnormal or we need to change your treatment, we will call you to review the results.  Testing/Procedures:   Follow-Up: Your physician wants you to follow-up in: [place] will receive a reminder letter in the mail two months in advance. If you don't receive a letter, please call our office to schedule the follow-up appointment.    Any Other Special Instructions Will Be Listed Below (If Applicable).  FOLLOW UP WITH DR Ronnald Ramp ON HEARING

## 2018-02-23 ENCOUNTER — Ambulatory Visit: Payer: Medicare Other | Admitting: General Practice

## 2018-02-23 DIAGNOSIS — Z7901 Long term (current) use of anticoagulants: Secondary | ICD-10-CM

## 2018-02-23 DIAGNOSIS — I4891 Unspecified atrial fibrillation: Secondary | ICD-10-CM

## 2018-02-23 LAB — POCT INR: INR: 1.8 — AB (ref 2.0–3.0)

## 2018-02-23 NOTE — Patient Instructions (Addendum)
Pre visit review using our clinic review tool, if applicable. No additional management support is needed unless otherwise documented below in the visit note.  Take 1 1/2 tablets today and then continue to take 1/2 tablet daily except 1 tablet on Monday/Wed and Fridays.  Re-check in 4 to 5  weeks per patient request.

## 2018-02-24 ENCOUNTER — Other Ambulatory Visit: Payer: Self-pay | Admitting: Internal Medicine

## 2018-02-27 ENCOUNTER — Other Ambulatory Visit: Payer: Self-pay | Admitting: Internal Medicine

## 2018-03-02 DIAGNOSIS — H353112 Nonexudative age-related macular degeneration, right eye, intermediate dry stage: Secondary | ICD-10-CM | POA: Diagnosis not present

## 2018-03-02 DIAGNOSIS — H524 Presbyopia: Secondary | ICD-10-CM | POA: Diagnosis not present

## 2018-03-02 DIAGNOSIS — H353111 Nonexudative age-related macular degeneration, right eye, early dry stage: Secondary | ICD-10-CM | POA: Diagnosis not present

## 2018-03-02 DIAGNOSIS — H35033 Hypertensive retinopathy, bilateral: Secondary | ICD-10-CM | POA: Diagnosis not present

## 2018-03-14 ENCOUNTER — Other Ambulatory Visit: Payer: Self-pay | Admitting: Internal Medicine

## 2018-03-23 ENCOUNTER — Ambulatory Visit: Payer: Medicare Other | Admitting: General Practice

## 2018-03-23 DIAGNOSIS — Z7901 Long term (current) use of anticoagulants: Secondary | ICD-10-CM

## 2018-03-23 LAB — POCT INR: INR: 3 (ref 2.0–3.0)

## 2018-03-23 NOTE — Patient Instructions (Signed)
Pre visit review using our clinic review tool, if applicable. No additional management support is needed unless otherwise documented below in the visit note.  Continue to take 1/2 tablet daily except 1 tablet on Monday/Wed and Fridays.  Re-check in 4 to 5  weeks per patient request.

## 2018-04-02 NOTE — Progress Notes (Signed)
[facility] Note  04/03/2018     CHIEF COMPLAINT Patient presents for Retina Follow Up   HISTORY OF PRESENT ILLNESS: Nathan [name] is a 77 y.o. male who presents to the clinic today for:   HPI    Retina Follow Up    Patient presents with  Other.  In both eyes.  Duration of 4 months.  Since onset it is stable.  I, the attending physician,  performed the HPI with the patient and updated documentation appropriately.          Comments    4 Month retina follow up. Patient states vision has no changes.ERM OU       Last edited by Bernarda Caffey, MD on 04/03/2018  1:50 PM. (History)      Referring physician: Janith Lima, MD [address], [place] [id number]  HISTORICAL INFORMATION:   Selected notes from the MEDICAL RECORD NUMBER Referred by Dr. Parke Simmers for concern of pseudohole OD and ERM OU LEE- 05.17.19 (K. Hecker) [BCVA OD: 20/30-2 OS: 20/25] Ocular Hx-  Pseudophakia OU, HTN retinopathy OU, non-exudative ARMD OU,  PMH- HTN, permanent A-Fib, chronic renal impairment, former smoker    CURRENT MEDICATIONS: No current outpatient medications on file. (Ophthalmic Drugs)   No current facility-administered medications for this visit.  (Ophthalmic Drugs)   Current Outpatient Medications (Other)  Medication Sig  . acetaminophen (TYLENOL) 650 MG CR tablet Take 650 mg by mouth every 8 (eight) hours as needed for pain.  Marland Kitchen allopurinol (ZYLOPRIM) 100 MG tablet TAKE 1 TABLET BY MOUTH ONCE DAILY  . Dermatological Products, Misc. [facility]) lotion Apply 1 Act topically 2 (two) times daily with a meal.  . diltiazem (CARDIZEM CD) 120 MG 24 hr capsule Take 1 capsule (120 mg total) by mouth daily.  . finasteride (PROSCAR) 5 MG tablet Take 5 mg by mouth daily. Rx'ed by [name]  . furosemide (LASIX) 40 MG tablet TAKE 1 TABLET BY MOUTH ONCE DAILY  . lisinopril (PRINIVIL,ZESTRIL) 40 MG tablet Take 1 tablet (40 mg total) by mouth daily.   . metoprolol tartrate (LOPRESSOR) 100 MG tablet Take 1 tablet (100 mg total) by mouth 2 (two) times daily. Please keep upcoming appt in November for future refills. Thank you  . metoprolol tartrate (LOPRESSOR) 100 MG tablet TAKE 1 TABLET BY MOUTH TWICE DAILY  . warfarin (COUMADIN) 5 MG tablet TAKE AS DIRECTED BY COUMADIN CLINIC. 30 DAY SUPPLY   No current facility-administered medications for this visit.  (Other)      REVIEW OF SYSTEMS: ROS    Positive for: Musculoskeletal, Cardiovascular, Eyes   Negative for: Constitutional, Gastrointestinal, Neurological, Skin, Genitourinary, HENT, Endocrine, Respiratory, Psychiatric, Allergic/Imm, Heme/Lymph   Last edited by Elmore Guise on 04/03/2018  1:35 PM. (History)       ALLERGIES Allergies  Allergen Reactions  . Tramadol Nausea And Vomiting    PAST MEDICAL HISTORY Past Medical History:  Diagnosis Date  . Cancer ([place])    skin  . Chronic renal impairment   . Diverticulosis   . DJD (degenerative joint disease)   . Elevated PSA    Dr Junious Silk  . H/O hiatal hernia   . Hypertension   . 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 Permanent atrial fibrillation   . PONV (postoperative nausea and vomiting)    no  problems recently   Past Surgical History:  Procedure Laterality Date  .  COLONOSCOPY  2003  . HERNIA REPAIR    . HIATAL HERNIA REPAIR     Dr Kaylyn Lim , laparoscopic  . INGUINAL HERNIA REPAIR Left 03/05/2013   Procedure: HERNIA REPAIR INGUINAL ADULT;  Surgeon: [name], MD;  Location: [facility];  Service: General;  Laterality: Left;  . INSERTION OF MESH Left 03/05/2013   Procedure: INSERTION OF MESH;  Surgeon: [name], MD;  Location: [facility];  Service: General;  Laterality: Left;  . TOTAL HIP ARTHROPLASTY Right 07  . TOTAL KNEE ARTHROPLASTY Right 07    FAMILY HISTORY Family History  Problem Relation Age of Onset  . Hypertension Mother   . Skin  cancer Mother   . Brain cancer Sister   . Hypertension Brother   . Diabetes Brother   . Stroke Neg Hx   . Heart disease Neg Hx     SOCIAL HISTORY Social History   Tobacco Use  . Smoking status: Former Smoker    Packs/day: 0.25    Years: 3.00    Pack years: 0.75    Types: Cigarettes    Last attempt to quit: 04/17/1991    Years since quitting: 26.9  . Smokeless tobacco: Never Used  Substance Use Topics  . Alcohol use: Yes    Alcohol/week: 2.0 standard drinks    Types: 2 Glasses of wine per week    Comment: occasionally  . Drug use: No         OPHTHALMIC EXAM:  Base Eye Exam    Visual Acuity (Snellen - Linear)      Right Left   Dist cc 20/30/-2 20/20-2   Dist ph cc 20/NI        Tonometry (Tonopen, 1:38 PM)      Right Left   Pressure 12 13       Pupils      Dark Light Shape React APD   Right 3 2 Round Brisk None   Left 3 2 Round Brisk None       Visual Fields (Counting fingers)      Left Right    Full Full       Extraocular Movement      Right Left    Full, Ortho Full, Ortho       Neuro/Psych    Oriented x3:  Yes   Mood/Affect:  Normal       Dilation    Both eyes:  1.0% Mydriacyl, 2.5% Phenylephrine @ 1:38 PM        Slit Lamp and Fundus Exam    Slit Lamp Exam      Right Left   Lids/Lashes Dermatochalasis - upper lid, Meibomian gland dysfunction Dermatochalasis - upper lid, Meibomian gland dysfunction, Telangiectasia   Conjunctiva/Sclera Temporal and nasal Pinguecula Nasal pterygium, Temporal Pinguecula   Cornea 2-3+ Punctate epithelial erosions, dry tear film, irregular epi  Nasal pterygium comes on 56mm at 0900, iron line at head of pteryigia, Inferior 3+ Punctate epithelial erosions, Arcus, irregular epi   Anterior Chamber Deep and quiet Deep and quiet   Iris Round and dilated Round and dilated   Lens Three piece Posterior chamber intraocular lens in good position, open PC Three piece Posterior chamber intraocular lens in good position    Vitreous Vitreous syneresis, Posterior vitreous detachment Vitreous syneresis, Posterior vitreous detachment, vitreous condensations       Fundus Exam      Right Left   Disc 360 Peripapillary atrophy, Pink and Sharp Hazy view, 1+pallor, mild tilt  C/D Ratio 0.3 0.4   Macula Grossly flat, Blunted foveal reflex, lamellar hole, Epiretinal membrane, RPE mottling and clumping, Drusen, No heme or edema Flat, Blunted foveal reflex, mild Retinal pigment epithelial mottling, Epiretinal membrane, No heme or edema   Vessels AV crossing changes, Vascular attenuation Vascular attenuation, AV crossing changes   Periphery Attached, laser scars at 0430, operculated hole at 0500 with good surrounding laser, laser scars at 0400, laser scars at 0130, Reticular degeneration Attached, operculated hole at 0600 - no SRF -- excellent laser changes surrounding, Reticular degeneration        Refraction    Wearing Rx      Sphere Cylinder Axis Add   Right -1.50 +1.50 003 +2.75   Left -1.50 +0.75 085 +2.75          IMAGING AND PROCEDURES  Imaging and Procedures for @TODAY @  OCT, Retina - OU - Both Eyes       Right Eye Quality was good. Central Foveal Thickness: 246. Progression has been stable. Findings include abnormal foveal contour, intraretinal fluid, no SRF, epiretinal membrane, retinal drusen , lamellar hole, outer retinal atrophy.   Left Eye Quality was good. Central Foveal Thickness: 266. Progression has been stable. Findings include normal foveal contour, no IRF, no SRF, epiretinal membrane.   Notes *Images captured and stored on drive  Diagnosis / Impression:  OD: mild ERM with lamellar hole -- stable from prior OS: NFP, No IRF/SRF, ERM -- stable from prior  Clinical management:  See below  Abbreviations: NFP - Normal foveal profile. CME - cystoid macular edema. PED - pigment epithelial detachment. IRF - intraretinal fluid. SRF - subretinal fluid. EZ - ellipsoid zone. ERM - epiretinal  membrane. ORA - outer retinal atrophy. ORT - outer retinal tubulation. SRHM - subretinal hyper-reflective material                  ASSESSMENT/PLAN:    ICD-10-CM   1. 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 Retinal tear of left eye H33.312   4. Posterior vitreous detachment of both eyes H43.813   5. History of retinal tear Z86.69   6. Retinal edema H35.81 OCT, Retina - OU - Both Eyes  7. Pseudophakia of both eyes Z96.1     1,2. ERM OU (OD > OS) w/ lamellar hole OD-  The natural history, anatomy, potential for loss of vision, and treatment options including vitrectomy techniques and the complications of endophthalmitis, retinal detachment, vitreous hemorrhage, cataract progression and permanent vision loss discussed with the patient. - BCVA 20/30; asymptomatic; denies metamorphopsia; happy with vision OD since receiving new glasses - stable on repeat OCT - pt wishes to continue monitoring for now - F/U 6 months  3. Retinal hole OS-   - lightly pigmented operculated hole at 6 oclock ant to equator - S/P laser retinopexy OS (06.03.19) -- excellent laser in place - no new RT/RD  4. PVD / vitreous syneresis OU  Discussed findings and prognosis  No other RT or RD on 360 scleral depressed exam  Reviewed s/s of RT/RD  Strict return precautions for any such RT/RD signs/symptoms  5. History of retinal holes OD-  - multiple laser scars with operculated hole at 0500 with excellent laser surrounding - done by Dr. Zigmund Daniel  - monitor  6. No retinal edema on exam or OCT  7. Pseudophakia OU  - s/p CE/IOL  - doing well  - monitor  Ophthalmic Meds Ordered this visit:  No orders of the defined types were placed in this encounter.      Return in about 6 months (around 10/03/2018) for F/U ERM with lamellar hole OD, DFE, OCT.  There are no Patient Instructions on file for this visit.   Explained the diagnoses,  plan, and follow up with the patient and they expressed understanding.  Patient expressed understanding of the importance of proper follow up care.   This document serves as a record of services personally performed by Gardiner Sleeper, MD, PhD. It was created on their behalf by Ernest Mallick, OA, an ophthalmic assistant. The creation of this record is the provider's dictation and/or activities during the visit.    Electronically signed by: Ernest Mallick, OA  12.16.19 2:19 PM     Gardiner Sleeper, M.D., Ph.D. Diseases & Surgery of the Retina and Vitreous [facility]  I have reviewed the above documentation for accuracy and completeness, and I agree with the above. Gardiner Sleeper, M.D., Ph.D. 04/03/18 2:20 PM    Abbreviations: M myopia (nearsighted); A astigmatism; H hyperopia (farsighted); P presbyopia; Mrx spectacle prescription;  CTL contact lenses; OD right eye; OS left eye; OU both eyes  XT exotropia; ET esotropia; PEK punctate epithelial keratitis; PEE punctate epithelial erosions; DES dry eye syndrome; MGD meibomian gland dysfunction; ATs artificial tears; PFAT's preservative free artificial tears; [place] nuclear sclerotic cataract; PSC posterior subcapsular cataract; ERM epi-retinal membrane; PVD posterior vitreous detachment; RD retinal detachment; DM diabetes mellitus; DR diabetic retinopathy; NPDR non-proliferative diabetic retinopathy; PDR proliferative diabetic retinopathy; CSME clinically significant macular edema; DME diabetic macular edema; dbh dot blot hemorrhages; CWS cotton wool spot; POAG primary open angle glaucoma; C/D cup-to-disc ratio; HVF humphrey visual field; GVF goldmann visual field; OCT optical coherence tomography; IOP intraocular pressure; BRVO Branch retinal vein occlusion; CRVO central retinal vein occlusion; CRAO central retinal artery occlusion; BRAO branch retinal artery occlusion; RT retinal tear; SB scleral buckle; PPV pars plana vitrectomy; VH  Vitreous hemorrhage; PRP panretinal laser photocoagulation; IVK intravitreal kenalog; VMT vitreomacular traction; MH Macular hole;  NVD neovascularization of the disc; NVE neovascularization elsewhere; AREDS age related eye disease study; ARMD age related macular degeneration; POAG primary open angle glaucoma; EBMD epithelial/anterior basement membrane dystrophy; ACIOL anterior chamber intraocular lens; IOL intraocular lens; PCIOL posterior chamber intraocular lens; Phaco/IOL phacoemulsification with intraocular lens placement; [place] photorefractive keratectomy; LASIK laser assisted in situ keratomileusis; HTN hypertension; DM diabetes mellitus; COPD chronic obstructive pulmonary disease

## 2018-04-03 ENCOUNTER — Ambulatory Visit (INDEPENDENT_AMBULATORY_CARE_PROVIDER_SITE_OTHER): Payer: Medicare Other | Admitting: Ophthalmology

## 2018-04-03 ENCOUNTER — Encounter (INDEPENDENT_AMBULATORY_CARE_PROVIDER_SITE_OTHER): Payer: Self-pay | Admitting: Ophthalmology

## 2018-04-03 DIAGNOSIS — H33312 Horseshoe tear of retina without detachment, left eye: Secondary | ICD-10-CM

## 2018-04-03 DIAGNOSIS — H43813 Vitreous degeneration, bilateral: Secondary | ICD-10-CM | POA: Diagnosis not present

## 2018-04-03 DIAGNOSIS — H3581 Retinal edema: Secondary | ICD-10-CM | POA: Diagnosis not present

## 2018-04-03 DIAGNOSIS — H35373 Puckering of macula, bilateral: Secondary | ICD-10-CM

## 2018-04-03 DIAGNOSIS — Z961 Presence of intraocular lens: Secondary | ICD-10-CM

## 2018-04-03 DIAGNOSIS — H35341 Macular cyst, hole, or pseudohole, right eye: Secondary | ICD-10-CM | POA: Diagnosis not present

## 2018-04-03 DIAGNOSIS — Z8669 Personal history of other diseases of the nervous system and sense organs: Secondary | ICD-10-CM

## 2018-04-20 ENCOUNTER — Ambulatory Visit: Payer: Medicare Other | Admitting: General Practice

## 2018-04-20 DIAGNOSIS — I4891 Unspecified atrial fibrillation: Secondary | ICD-10-CM

## 2018-04-20 DIAGNOSIS — Z7901 Long term (current) use of anticoagulants: Secondary | ICD-10-CM

## 2018-04-20 LAB — POCT INR: INR: 1.8 — AB (ref 2.0–3.0)

## 2018-04-20 NOTE — Patient Instructions (Signed)
Pre visit review using our clinic review tool, if applicable. No additional management support is needed unless otherwise documented below in the visit note.  Take 1 1/2 tablets today and then continue to take 1/2 tablet daily except 1 tablet on Monday/Wed and Fridays.  Re-check in 4 to 5  weeks per patient request.

## 2018-04-24 ENCOUNTER — Other Ambulatory Visit: Payer: Self-pay | Admitting: Internal Medicine

## 2018-04-24 DIAGNOSIS — M1000 Idiopathic gout, unspecified site: Secondary | ICD-10-CM

## 2018-04-27 ENCOUNTER — Other Ambulatory Visit: Payer: Self-pay | Admitting: Internal Medicine

## 2018-04-27 ENCOUNTER — Telehealth: Payer: Self-pay | Admitting: Internal Medicine

## 2018-05-01 ENCOUNTER — Other Ambulatory Visit: Payer: Self-pay | Admitting: Internal Medicine

## 2018-05-02 ENCOUNTER — Other Ambulatory Visit: Payer: Self-pay | Admitting: General Practice

## 2018-05-02 DIAGNOSIS — Z7901 Long term (current) use of anticoagulants: Secondary | ICD-10-CM

## 2018-05-02 MED ORDER — WARFARIN SODIUM 5 MG PO TABS: ORAL_TABLET | ORAL | 3 refills | Status: DC

## 2018-05-02 NOTE — Telephone Encounter (Signed)
Patient called to check the status of his refill.  Patient does have an appointment scheduled for 05/23/2018.  Please resend to pharmacy because needs this medication as soon as possible.

## 2018-05-13 ENCOUNTER — Other Ambulatory Visit: Payer: Self-pay | Admitting: Internal Medicine

## 2018-05-15 NOTE — Telephone Encounter (Signed)
Outpatient Medication Detail    Disp Refills Start End   metoprolol tartrate (LOPRESSOR) 100 MG tablet 180 tablet 3 03/14/2018    Sig: TAKE 1 TABLET BY MOUTH TWICE DAILY   Sent to pharmacy as: metoprolol tartrate (LOPRESSOR) 100 MG tablet   E-Prescribing Status: Receipt confirmed by pharmacy (03/14/2018 1:59 PM EST)   Pharmacy   [id number] - [place], [address]

## 2018-05-23 ENCOUNTER — Ambulatory Visit: Payer: Medicare Other | Admitting: Internal Medicine

## 2018-05-25 ENCOUNTER — Ambulatory Visit: Payer: Medicare Other | Admitting: General Practice

## 2018-05-25 DIAGNOSIS — Z7901 Long term (current) use of anticoagulants: Secondary | ICD-10-CM

## 2018-05-25 LAB — POCT INR: INR: 4.1 — AB (ref 2.0–3.0)

## 2018-05-25 NOTE — Patient Instructions (Addendum)
Pre visit review using our clinic review tool, if applicable. No additional management support is needed unless otherwise documented below in the visit note.  Hold coumadin today and tomorrow (2/7 and 2/8) and then continue to take 1/2 tablet daily except 1 tablet on Mon Wed and Fridays.  Re-check in 3 weeks.

## 2018-06-15 ENCOUNTER — Ambulatory Visit: Payer: Medicare Other | Admitting: General Practice

## 2018-06-15 DIAGNOSIS — Z7901 Long term (current) use of anticoagulants: Secondary | ICD-10-CM | POA: Diagnosis not present

## 2018-06-15 DIAGNOSIS — I4891 Unspecified atrial fibrillation: Secondary | ICD-10-CM

## 2018-06-15 LAB — POCT INR: INR: 3.1 — AB (ref 2.0–3.0)

## 2018-06-15 NOTE — Patient Instructions (Addendum)
Pre visit review using our clinic review tool, if applicable. No additional management support is needed unless otherwise documented below in the visit note.  Hold coumadin tomorrow (2/29) and then continue to take 1/2 tablet daily except 1 tablet on Mon Wed and Fridays.  Re-check in 4 weeks.

## 2018-06-18 ENCOUNTER — Encounter: Payer: Self-pay | Admitting: Internal Medicine

## 2018-06-18 ENCOUNTER — Ambulatory Visit (INDEPENDENT_AMBULATORY_CARE_PROVIDER_SITE_OTHER): Payer: Medicare Other | Admitting: Internal Medicine

## 2018-06-18 ENCOUNTER — Other Ambulatory Visit (INDEPENDENT_AMBULATORY_CARE_PROVIDER_SITE_OTHER): Payer: Medicare Other

## 2018-06-18 ENCOUNTER — Ambulatory Visit (INDEPENDENT_AMBULATORY_CARE_PROVIDER_SITE_OTHER): Payer: Medicare Other | Admitting: *Deleted

## 2018-06-18 VITALS — BP 126/64 | HR 71 | Temp 97.9°F | Resp 17 | Ht 72.0 in | Wt 204.0 lb

## 2018-06-18 VITALS — BP 126/64 | HR 71 | Temp 97.9°F | Ht 72.0 in | Wt 204.0 lb

## 2018-06-18 DIAGNOSIS — E781 Pure hyperglyceridemia: Secondary | ICD-10-CM | POA: Diagnosis not present

## 2018-06-18 DIAGNOSIS — Z0000 Encounter for general adult medical examination without abnormal findings: Secondary | ICD-10-CM | POA: Diagnosis not present

## 2018-06-18 DIAGNOSIS — N183 Chronic kidney disease, stage 3 unspecified: Secondary | ICD-10-CM

## 2018-06-18 DIAGNOSIS — I10 Essential (primary) hypertension: Secondary | ICD-10-CM

## 2018-06-18 DIAGNOSIS — I4821 Permanent atrial fibrillation: Secondary | ICD-10-CM | POA: Diagnosis not present

## 2018-06-18 DIAGNOSIS — E559 Vitamin D deficiency, unspecified: Secondary | ICD-10-CM | POA: Diagnosis not present

## 2018-06-18 LAB — CBC WITH DIFFERENTIAL/PLATELET
Basophils Absolute: 0.1 10*3/uL (ref 0.0–0.1)
Basophils Relative: 0.9 % (ref 0.0–3.0)
Eosinophils Absolute: 0.2 10*3/uL (ref 0.0–0.7)
Eosinophils Relative: 2.8 % (ref 0.0–5.0)
HCT: 40.1 % (ref 39.0–52.0)
Hemoglobin: 13.5 g/dL (ref 13.0–17.0)
Lymphocytes Relative: 13.4 % (ref 12.0–46.0)
Lymphs Abs: 1 10*3/uL (ref 0.7–4.0)
MCHC: 33.7 g/dL (ref 30.0–36.0)
MCV: 94.6 fl (ref 78.0–100.0)
Monocytes Absolute: 0.7 10*3/uL (ref 0.1–1.0)
Monocytes Relative: 9.5 % (ref 3.0–12.0)
Neutro Abs: 5.7 10*3/uL (ref 1.4–7.7)
Neutrophils Relative %: 73.4 % (ref 43.0–77.0)
Platelets: 255 10*3/uL (ref 150.0–400.0)
RBC: 4.24 Mil/uL (ref 4.22–5.81)
RDW: 15.2 % (ref 11.5–15.5)
WBC: 7.7 10*3/uL (ref 4.0–10.5)

## 2018-06-18 LAB — LIPID PANEL
Cholesterol: 148 mg/dL (ref 0–200)
HDL: 47.1 mg/dL (ref >39.00)
LDL Cholesterol: 76 mg/dL (ref 0–99)
NonHDL: 101.14
Total CHOL/HDL Ratio: 3
Triglycerides: 127 mg/dL (ref 0.0–149.0)
VLDL: 25.4 mg/dL (ref 0.0–40.0)

## 2018-06-18 LAB — TSH: TSH: 3.12 u[IU]/mL (ref 0.35–4.50)

## 2018-06-18 LAB — COMPREHENSIVE METABOLIC PANEL
ALT: 11 U/L (ref 0–53)
AST: 12 U/L (ref 0–37)
Albumin: 3.9 g/dL (ref 3.5–5.2)
Alkaline Phosphatase: 67 U/L (ref 39–117)
BUN: 40 mg/dL — ABNORMAL HIGH (ref 6–23)
CO2: 27 mEq/L (ref 19–32)
Calcium: 9.1 mg/dL (ref 8.4–10.5)
Chloride: 105 mEq/L (ref 96–112)
Creatinine, Ser: 2.05 mg/dL — ABNORMAL HIGH (ref 0.40–1.50)
GFR: 31.39 mL/min — ABNORMAL LOW (ref >60.00)
Glucose, Bld: 83 mg/dL (ref 70–99)
Potassium: 4.2 mEq/L (ref 3.5–5.1)
Sodium: 142 mEq/L (ref 135–145)
Total Bilirubin: 0.7 mg/dL (ref 0.2–1.2)
Total Protein: 7.1 g/dL (ref 6.0–8.3)

## 2018-06-18 LAB — VITAMIN D 25 HYDROXY (VIT D DEFICIENCY, FRACTURES): VITD: 35.07 ng/mL (ref 30.00–100.00)

## 2018-06-18 NOTE — Progress Notes (Signed)
Subjective:  Patient ID: Nathan [name], male    DOB: 1940/08/02  Age: 78 y.o. MRN: [id number]  CC: Hypertension; Hyperlipidemia; and Atrial Fibrillation   HPI Nathan [name] presents for f/up - He complains of chronic, nonradiating low back pain.  He gets symptom relief with Tylenol.  He denies paresthesias in his lower extremities.  He tells me his blood pressure has been well controlled.  He denies any recent episodes of palpitations, CP, SOB, or DOE.  He has chronic, mild, unchanged lower extremity edema.  Outpatient Medications Prior to Visit  Medication Sig Dispense Refill  . acetaminophen (TYLENOL) 650 MG CR tablet Take 650 mg by mouth every 8 (eight) hours as needed for pain.    Marland Kitchen allopurinol (ZYLOPRIM) 100 MG tablet TAKE 1 TABLET BY MOUTH ONCE DAILY 90 tablet 0  . Dermatological Products, Misc. [facility]) lotion Apply 1 Act topically 2 (two) times daily with a meal. 100 g 11  . diltiazem (CARDIZEM CD) 120 MG 24 hr capsule Take 1 capsule (120 mg total) by mouth daily. 90 capsule 2  . finasteride (PROSCAR) 5 MG tablet Take 5 mg by mouth daily. Rx'ed by [name]    . furosemide (LASIX) 40 MG tablet TAKE 1 TABLET BY MOUTH ONCE DAILY 90 tablet 3  . lisinopril (PRINIVIL,ZESTRIL) 40 MG tablet Take 1 tablet (40 mg total) by mouth daily. 90 tablet 2  . metoprolol tartrate (LOPRESSOR) 100 MG tablet Take 1 tablet (100 mg total) by mouth 2 (two) times daily. Please keep upcoming appt in November for future refills. Thank you 180 tablet 0  . warfarin (COUMADIN) 5 MG tablet Take 1/2 tablet daily except take 1 tablet on Mon Wed and Fridays or TAKE AS DIRECTED BY [facility] 30 tablet 3   No facility-administered medications prior to visit.     ROS Review of Systems  Constitutional: Negative for chills, diaphoresis, fatigue and fever.  HENT: Negative.   Eyes: Negative for visual disturbance.  Respiratory: Negative for cough, chest tightness, shortness of breath and wheezing.     Cardiovascular: Negative for chest pain, palpitations and leg swelling.  Gastrointestinal: Negative for abdominal pain, constipation, diarrhea, nausea and vomiting.  Endocrine: Negative.   Genitourinary: Negative.  Negative for difficulty urinating and dysuria.  Musculoskeletal: Positive for back pain. Negative for arthralgias, myalgias and neck pain.  Skin: Negative.  Negative for color change, pallor and rash.  Neurological: Negative.  Negative for dizziness, weakness, light-headedness, numbness and headaches.  Hematological: Negative for adenopathy. Does not bruise/bleed easily.  Psychiatric/Behavioral: Negative.     Objective:  BP 126/64 (BP Location: Left Arm, Patient Position: Sitting, Cuff Size: Normal)   Pulse 71   Temp 97.9 F (36.6 C) (Oral)   Ht 6' (1.829 m)   Wt 204 lb (92.5 kg)   SpO2 98%   BMI 27.67 kg/m   BP Readings from Last 3 Encounters:  06/18/18 126/64  02/20/18 128/70  03/20/17 120/80    Wt Readings from Last 3 Encounters:  06/18/18 204 lb (92.5 kg)  02/20/18 207 lb (93.9 kg)  03/20/17 214 lb (97.1 kg)    Physical Exam Vitals signs reviewed.  Constitutional:      Appearance: He is not ill-appearing or diaphoretic.  HENT:     Nose: Nose normal. No congestion.     Mouth/Throat:     Mouth: Mucous membranes are moist.     Pharynx: Oropharynx is clear. No oropharyngeal exudate or posterior oropharyngeal erythema.  Eyes:  General: No scleral icterus.    Conjunctiva/sclera: Conjunctivae normal.  Neck:     Musculoskeletal: Normal range of motion and neck supple.  Cardiovascular:     Rate and Rhythm: Normal rate. Rhythm irregularly irregular.     Heart sounds: No murmur.  Pulmonary:     Effort: Pulmonary effort is normal.     Breath sounds: Normal breath sounds. No wheezing, rhonchi or rales.  Abdominal:     General: Abdomen is flat.     Palpations: There is no hepatomegaly, splenomegaly or mass.     Tenderness: There is no abdominal  tenderness.  Musculoskeletal:        General: No swelling.     Right lower leg: Edema (trace) present.     Left lower leg: Edema (trace) present.  Skin:    General: Skin is warm and dry.     Coloration: Skin is not pale.     Findings: No erythema.  Neurological:     General: No focal deficit present.     Mental Status: He is oriented to person, place, and time. Mental status is at baseline.     Lab Results  Component Value Date   WBC 8.2 03/20/2017   HGB 13.9 03/20/2017   HCT 42.8 03/20/2017   PLT 271.0 03/20/2017   GLUCOSE 101 (H) 03/20/2017   CHOL 149 03/20/2017   TRIG 137.0 03/20/2017   HDL 45.90 03/20/2017   LDLCALC 76 03/20/2017   ALT 9 03/20/2017   AST 13 03/20/2017   NA 145 03/20/2017   K 4.4 03/20/2017   CL 106 03/20/2017   CREATININE 1.55 (H) 03/20/2017   BUN 27 (H) 03/20/2017   CO2 28 03/20/2017   TSH 1.97 03/20/2017   INR 3.1 (A) 06/15/2018   HGBA1C 5.4 01/20/2015    Mr Abdomen Wwo Contrast  Result Date: 08/03/2016 CLINICAL DATA:  Annual follow-up for known renal and pancreatic cysts EXAM: MRI ABDOMEN WITHOUT AND WITH CONTRAST TECHNIQUE: Multiplanar multisequence MR imaging of the abdomen was performed both before and after the administration of intravenous contrast. CONTRAST:  58mL MULTIHANCE GADOBENATE DIMEGLUMINE 529 MG/ML IV SOLN COMPARISON:  MRI abdomen dated 05/08/2015. CT abdomen/ pelvis dated 01/22/2015. FINDINGS: Lower chest: Lung bases are clear.  Cardiomegaly. Hepatobiliary: Multiple hepatic cysts, measuring up to 3.3 cm in segment 2 (series 7/ image 13). No suspicious/enhancing hepatic lesions. Gallbladder is unremarkable. No intrahepatic or extrahepatic ductal dilatation. Pancreas: 13 mm nonenhancing unilocular cyst in the pancreatic head (series 7/ image 27). No associated main pancreatic ductal dilatation. No pancreatic atrophy. Spleen: Splenic cysts, including a dominant 5.2 cm cyst in the splenic hilum (series 7/ image 25), without enhancement.  Adrenals/Urinary Tract:  Adrenal glands are within normal limits. Multiple bilateral renal cysts of varying sizes and complexities, many of which are hemorrhagic, including a dominant 10.7 cm exophytic hemorrhagic cyst along the right upper kidney (series 1000/ image 53). None of these cysts demonstrate enhancement following contrast administration. No hydronephrosis. Stomach/Bowel: Stomach is notable for a small hiatal hernia. Visualized bowel is unremarkable. Vascular/Lymphatic: No evidence of abdominal aortic aneurysm. Mild ectasia of the infrarenal abdominal aorta measuring up to 3.0 cm (series 1004/ image 81). No suspicious abdominal lymphadenopathy. Other:  No abdominal ascites. Musculoskeletal: No focal osseous lesions. IMPRESSION: 13 mm nonenhancing cyst in the pancreatic head, favored to reflect a pseudocyst or side branch IPMN, unchanged. Given the patient's current age, at least one additional follow-up study is suggested in 2 years. This recommendation follows ACR consensus guidelines: Management  of Incidental Pancreatic Cysts: A White Paper of the ACR Incidental Findings Committee. [place] 1245;80:998-338. Multiple bilateral simple and hemorrhagic renal cysts, without enhancement, benign (Bosniak I-II). Multiple hepatic and splenic cysts, benign. Additional ancillary findings as above. Electronically Signed   By: Julian Hy M.D.   On: 08/03/2016 17:14   Mr 3d Recon At Scanner  Result Date: 08/03/2016 CLINICAL DATA:  Annual follow-up for known renal and pancreatic cysts EXAM: MRI ABDOMEN WITHOUT AND WITH CONTRAST TECHNIQUE: Multiplanar multisequence MR imaging of the abdomen was performed both before and after the administration of intravenous contrast. CONTRAST:  46mL MULTIHANCE GADOBENATE DIMEGLUMINE 529 MG/ML IV SOLN COMPARISON:  MRI abdomen dated 05/08/2015. CT abdomen/ pelvis dated 01/22/2015. FINDINGS: Lower chest: Lung bases are clear.  Cardiomegaly. Hepatobiliary: Multiple  hepatic cysts, measuring up to 3.3 cm in segment 2 (series 7/ image 13). No suspicious/enhancing hepatic lesions. Gallbladder is unremarkable. No intrahepatic or extrahepatic ductal dilatation. Pancreas: 13 mm nonenhancing unilocular cyst in the pancreatic head (series 7/ image 27). No associated main pancreatic ductal dilatation. No pancreatic atrophy. Spleen: Splenic cysts, including a dominant 5.2 cm cyst in the splenic hilum (series 7/ image 25), without enhancement. Adrenals/Urinary Tract:  Adrenal glands are within normal limits. Multiple bilateral renal cysts of varying sizes and complexities, many of which are hemorrhagic, including a dominant 10.7 cm exophytic hemorrhagic cyst along the right upper kidney (series 1000/ image 53). None of these cysts demonstrate enhancement following contrast administration. No hydronephrosis. Stomach/Bowel: Stomach is notable for a small hiatal hernia. Visualized bowel is unremarkable. Vascular/Lymphatic: No evidence of abdominal aortic aneurysm. Mild ectasia of the infrarenal abdominal aorta measuring up to 3.0 cm (series 1004/ image 81). No suspicious abdominal lymphadenopathy. Other:  No abdominal ascites. Musculoskeletal: No focal osseous lesions. IMPRESSION: 13 mm nonenhancing cyst in the pancreatic head, favored to reflect a pseudocyst or side branch IPMN, unchanged. Given the patient's current age, at least one additional follow-up study is suggested in 2 years. This recommendation follows ACR consensus guidelines: Management of Incidental Pancreatic Cysts: A White Paper of the ACR Incidental Findings Committee. [place] 2505;39:767-341. Multiple bilateral simple and hemorrhagic renal cysts, without enhancement, benign (Bosniak I-II). Multiple hepatic and splenic cysts, benign. Additional ancillary findings as above. Electronically Signed   By: Julian Hy M.D.   On: 08/03/2016 17:14    Assessment & Plan:   Khaled was seen today for hypertension,  hyperlipidemia and atrial fibrillation.  Diagnoses and all orders for this visit:  Permanent atrial fibrillation- He is maintaining good rate control with the beta-blocker and calcium channel blocker.  Will continue anticoagulation with warfarin. -     TSH; Future  Essential hypertension- His blood pressure is adequately well controlled. -     CBC with Differential/Platelet; Future  Kidney disease, chronic, stage III (GFR 30-59 ml/min) ([place])- His renal function is stable.  He will avoid nephrotoxic agents. -     Comprehensive metabolic panel; Future -     CBC with Differential/Platelet; Future -     Urinalysis, Routine w reflex microscopic; Future -     VITAMIN D 25 Hydroxy (Vit-D Deficiency, Fractures); Future  Hypertriglyceridemia- His triglycerides are normal now. -     Lipid panel; Future  Vitamin D deficiency -     VITAMIN D 25 Hydroxy (Vit-D Deficiency, Fractures); Future   I am having Ansel Bong maintain his finasteride, acetaminophen, EPICERAM, allopurinol, metoprolol tartrate, diltiazem, lisinopril, furosemide, and warfarin.  No orders of the  defined types were placed in this encounter.    Follow-up: No follow-ups on file.  Scarlette Calico, MD

## 2018-06-18 NOTE — Patient Instructions (Signed)

## 2018-06-18 NOTE — Patient Instructions (Addendum)
If you cannot attend class in person, you can still exercise at home. Video taped versions of [employer] classes are shown on [employer] (GTN) at 8 am and 1 pm Mondays through Fridays. You can also purchase a copy of the AHOY DVD by calling [place] (GTN) [employer]. GTN is available on [employer] channel 13 with a digital cable box and on [employer] channel 31. GTN is also available on [employer], channel 99. To view GTN, go to channel 99, press OK, select [place], then select GT[employer] to start the channel.  Continue doing brain stimulating activities (puzzles, reading, adult coloring books, staying active) to keep memory sharp.   Continue to eat heart healthy diet (full of fruits, vegetables, whole grains, lean protein, water--limit salt, fat, and sugar intake) and increase physical activity as tolerated.   Preventive Care 80 Years and Older, Male Preventive care refers to lifestyle choices and visits with your health care provider that can promote health and wellness. What does preventive care include?   A yearly physical exam. This is also called an annual well check.  Dental exams once or twice a year.  Routine eye exams. Ask your health care provider how often you should have your eyes checked.  Personal lifestyle choices, including: ? Daily care of your teeth and gums. ? Regular physical activity. ? Eating a healthy diet. ? Avoiding tobacco and drug use. ? Limiting alcohol use. ? Practicing safe sex. ? Taking low doses of aspirin every day. ? Taking vitamin and mineral supplements as recommended by your health care provider. What happens during an annual well check? The services and screenings done by your health care provider during your annual well check will depend on your age, overall health, lifestyle risk factors, and family history of disease. Counseling Your health care provider may ask you questions about your:  Alcohol  use.  Tobacco use.  Drug use.  Emotional well-being.  Home and relationship well-being.  Sexual activity.  Eating habits.  History of falls.  Memory and ability to understand (cognition).  Work and work Statistician. Screening You may have the following tests or measurements:  Height, weight, and BMI.  Blood pressure.  Lipid and cholesterol levels. These may be checked every 5 years, or more frequently if you are over 56 years old.  Skin check.  Lung cancer screening. You may have this screening every year starting at age 63 if you have a 30-pack-year history of smoking and currently smoke or have quit within the past 15 years.  Colorectal cancer screening. All adults should have this screening starting at age 35 and continuing until age 27. You will have tests every 1-10 years, depending on your results and the type of screening test. People at increased risk should start screening at an earlier age. Screening tests may include: ? Guaiac-based fecal occult blood testing. ? Fecal immunochemical test (FIT). ? Stool DNA test. ? Virtual colonoscopy. ? Sigmoidoscopy. During this test, a flexible tube with a tiny camera (sigmoidoscope) is used to examine your rectum and lower colon. The sigmoidoscope is inserted through your anus into your rectum and lower colon. ? Colonoscopy. During this test, a long, thin, flexible tube with a tiny camera (colonoscope) is used to examine your entire colon and rectum.  Prostate cancer screening. Recommendations will vary depending on your family history and other risks.  Hepatitis C blood test.  Hepatitis B blood test.  Sexually transmitted disease (STD) testing.  Diabetes screening. This is  done by checking your blood sugar (glucose) after you have not eaten for a while (fasting). You may have this done every 1-3 years.  Abdominal aortic aneurysm (AAA) screening. You may need this if you are a current or former smoker.  Osteoporosis.  You may be screened starting at age 72 if you are at high risk. Talk with your health care provider about your test results, treatment options, and if necessary, the need for more tests. Vaccines Your health care provider may recommend certain vaccines, such as:  Influenza vaccine. This is recommended every year.  Tetanus, diphtheria, and acellular pertussis (Tdap, Td) vaccine. You may need a Td booster every 10 years.  Varicella vaccine. You may need this if you have not been vaccinated.  Zoster vaccine. You may need this after age 65.  Measles, mumps, and rubella (MMR) vaccine. You may need at least one dose of MMR if you were born in 1957 or later. You may also need a second dose.  Pneumococcal 13-valent conjugate (PCV13) vaccine. One dose is recommended after age 64.  Pneumococcal polysaccharide (PPSV23) vaccine. One dose is recommended after age 11.  Meningococcal vaccine. You may need this if you have certain conditions.  Hepatitis A vaccine. You may need this if you have certain conditions or if you travel or work in places where you may be exposed to hepatitis A.  Hepatitis B vaccine. You may need this if you have certain conditions or if you travel or work in places where you may be exposed to hepatitis B.  Haemophilus influenzae type b (Hib) vaccine. You may need this if you have certain risk factors. Talk to your health care provider about which screenings and vaccines you need and how often you need them. This information is not intended to replace advice given to you by your health care provider. Make sure you discuss any questions you have with your health care provider. Document Released: 05/01/2015 Document Revised: 05/25/2017 Document Reviewed: 02/03/2015 Elsevier Interactive Patient Education  2019 [employer].

## 2018-06-18 NOTE — Progress Notes (Addendum)
Subjective:   Nathan [name] is a 78 y.o. male who presents for an Initial Medicare Annual Wellness Visit.  Review of Systems  No ROS.  Medicare Wellness Visit. Additional risk factors are reflected in the social history.  Cardiac Risk Factors include: advanced age (>6men, >89 women);hypertension Sleep patterns: gets up 2-3 times nightly to void and sleeps 6-8 hours nightly.    Home Safety/Smoke Alarms: Feels safe in home. Smoke alarms in place.  Living environment; residence and Firearm Safety: 1-story house/ trailer, equipment: Walkers, Type: Civil Service fast streamer, Type: Tub Surveyor, quantity. Lives alone, no needs for DME, good support system Seat Belt Safety/Bike Helmet: Wears seat belt.    Objective:    Today's Vitals   06/18/18 1527 06/18/18 1724  BP: 126/64   Pulse: 71   Resp: 17   Temp: 97.9 F (36.6 C)   SpO2: 98%   Weight: 204 lb (92.5 kg)   Height: 6' (1.829 m)   PainSc:  3    Body mass index is 27.67 kg/m.  Advanced Directives 06/18/2018 01/20/2015 01/20/2015 02/25/2013  Does Patient Have a Medical Advance Directive? No No No Patient does not have advance directive  Would patient like information on creating a medical advance directive? Yes (ED - Information included in AVS) - - -    Current Medications (verified) Outpatient Encounter Medications as of 06/18/2018  Medication Sig  . acetaminophen (TYLENOL) 650 MG CR tablet Take 650 mg by mouth every 8 (eight) hours as needed for pain.  Marland Kitchen allopurinol (ZYLOPRIM) 100 MG tablet TAKE 1 TABLET BY MOUTH ONCE DAILY  . Dermatological Products, Misc. [facility]) lotion Apply 1 Act topically 2 (two) times daily with a meal.  . diltiazem (CARDIZEM CD) 120 MG 24 hr capsule Take 1 capsule (120 mg total) by mouth daily.  . finasteride (PROSCAR) 5 MG tablet Take 5 mg by mouth daily. Rx'ed by [name]  . furosemide (LASIX) 40 MG tablet TAKE 1 TABLET BY MOUTH ONCE DAILY  . lisinopril (PRINIVIL,ZESTRIL) 40 MG tablet Take  1 tablet (40 mg total) by mouth daily.  . metoprolol tartrate (LOPRESSOR) 100 MG tablet Take 1 tablet (100 mg total) by mouth 2 (two) times daily. Please keep upcoming appt in November for future refills. Thank you  . warfarin (COUMADIN) 5 MG tablet Take 1/2 tablet daily except take 1 tablet on Mon Wed and Fridays or TAKE AS DIRECTED BY [facility]  . [DISCONTINUED] metoprolol tartrate (LOPRESSOR) 100 MG tablet TAKE 1 TABLET BY MOUTH TWICE DAILY (Patient not taking: Reported on 06/18/2018)   No facility-administered encounter medications on file as of 06/18/2018.     Allergies (verified) Tramadol   History: Past Medical History:  Diagnosis Date  . Cancer ([place])    skin  . Chronic renal impairment   . Diverticulosis   . DJD (degenerative joint disease)   . Elevated PSA    Dr Junious Silk  . H/O hiatal hernia   . Hypertension   . 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 Permanent atrial fibrillation   . PONV (postoperative nausea and vomiting)    no  problems recently   Past Surgical History:  Procedure Laterality Date  . COLONOSCOPY  2003  . HERNIA REPAIR    . HIATAL HERNIA REPAIR     Dr Kaylyn Lim , laparoscopic  . INGUINAL HERNIA REPAIR Left 03/05/2013   Procedure: HERNIA REPAIR INGUINAL ADULT;  Surgeon: [name],  MD;  Location: [facility];  Service: General;  Laterality: Left;  . INSERTION OF MESH Left 03/05/2013   Procedure: INSERTION OF MESH;  Surgeon: [name], MD;  Location: [place];  Service: General;  Laterality: Left;  . TOTAL HIP ARTHROPLASTY Right 07  . TOTAL KNEE ARTHROPLASTY Right 07   Family History  Problem Relation Age of Onset  . Hypertension Mother   . Skin cancer Mother   . Brain cancer Sister   . Hypertension Brother   . Diabetes Brother   . Stroke Neg Hx   . Heart disease Neg Hx    Social History   Socioeconomic History  . Marital status: Single    Spouse name: Not on file    . Number of children: 5  . Years of education: Not on file  . Highest education level: Not on file  Occupational History  . Not on file  Social Needs  . Financial resource strain: Not hard at all  . Food insecurity:    Worry: Never true    Inability: Never true  . Transportation needs:    Medical: No    Non-medical: No  Tobacco Use  . Smoking status: Former Smoker    Packs/day: 0.25    Years: 3.00    Pack years: 0.75    Types: Cigarettes    Last attempt to quit: 04/17/1991    Years since quitting: 27.1  . Smokeless tobacco: Never Used  Substance and Sexual Activity  . Alcohol use: Yes    Alcohol/week: 2.0 standard drinks    Types: 2 Glasses of wine per week    Comment: occasionally  . Drug use: No  . Sexual activity: Not Currently  Lifestyle  . Physical activity:    Days per week: 0 days    Minutes per session: 0 min  . Stress: Not at all  Relationships  . Social connections:    Talks on phone: More than three times a week    Gets together: More than three times a week    Attends religious service: 1 to 4 times per year    Active member of club or organization: Not on file    Attends meetings of clubs or organizations: Not on file    Relationship status: Not on file  Other Topics Concern  . Not on file  Social History Narrative   Retired Agricultural consultant   Lives with son   Tobacco Counseling Counseling given: Not Answered  Activities of Daily Living In your present state of health, do you have any difficulty performing the following activities: 06/18/2018  Hearing? N  Vision? N  Difficulty concentrating or making decisions? N  Walking or climbing stairs? N  Dressing or bathing? N  Doing errands, shopping? N  Preparing Food and eating ? N  Using the Toilet? N  In the past six months, have you accidently leaked urine? N  Do you have problems with loss of bowel control? N  Managing your Medications? N  Managing your Finances? N  Housekeeping or managing your  Housekeeping? N  Some recent data might be hidden     Immunizations and Health Maintenance Immunization History  Administered Date(s) Administered  . Influenza Split 04/26/2011, 02/22/2012  . Influenza Whole 03/07/2007, 03/18/2009  . Influenza, High Dose Seasonal PF 02/25/2016, 01/06/2017, 01/16/2018  . Influenza,inj,Quad PF,6+ Mos 02/19/2013, 02/15/2014, 01/23/2015  . Pneumococcal Conjugate-13 03/16/2016  . Pneumococcal Polysaccharide-23 03/18/2003, 03/20/2017  . Tdap 01/28/2015   There are no preventive care  reminders to display for this patient.  Patient Care Team: Janith Lima, MD as PCP - General (Internal Medicine)  Indicate any recent Medical Services you may have received from other than [facility] providers in the past year (date may be approximate).    Assessment:   This is a routine wellness examination for [place]. Physical assessment deferred to PCP.  Hearing/Vision screen Hearing Screening Comments: HOH, patient will [facility] Vision Screening Comments: appointment yearly Dr. Herbert Deaner  Dietary issues and exercise activities discussed: Current Exercise Habits: The patient does not participate in regular exercise at present(chair print-outs provided), Exercise limited by: orthopedic condition(s)  Diet (meal preparation, eat out, water intake, caffeinated beverages, dairy products, fruits and vegetables): in general, a "healthy" diet  , on average, 1 meals per day   Reviewed heart healthy diet. Encouraged patient to increase daily water and healthy fluid intake.  Discussed supplementing with Ensure, samples and coupons provided.  Goals   None    Depression Screen PHQ 2/9 Scores 06/18/2018 02/22/2012  PHQ - 2 Score 2 4  PHQ- 9 Score 2 -    Fall Risk Fall Risk  10/04/2010  Risk for fall due to : Impaired balance/gait  Risk for fall due to: Comment uses cane which is too short, advised to make cane longer    Cognitive Function: MMSE - Mini Mental State  Exam 06/18/2018  Orientation to time 5  Orientation to Place 5  Registration 3  Attention/ Calculation 3  Recall 1  Language- name 2 objects 2  Language- repeat 1  Language- follow 3 step command 3  Language- read & follow direction 1  Write a sentence 1  Copy design 1  Total score 26        Screening Tests Health Maintenance  Topic Date Due  . TETANUS/TDAP  01/27/2025  . INFLUENZA VACCINE  Completed  . PNA vac Low Risk Adult  Completed      Plan:     Reviewed health maintenance screenings with patient today and relevant education, vaccines, and/or referrals were provided.   Continue doing brain stimulating activities (puzzles, reading, adult coloring books, staying active) to keep memory sharp.   Continue to eat heart healthy diet (full of fruits, vegetables, whole grains, lean protein, water--limit salt, fat, and sugar intake) and increase physical activity as tolerated.   I have personally reviewed and noted the following in the patient's chart:   . Medical and social history . Use of alcohol, tobacco or illicit drugs  . Current medications and supplements . Functional ability and status . Nutritional status . Physical activity . Advanced directives . List of other physicians . Vitals . Screenings to include cognitive, depression, and falls . Referrals and appointments  In addition, I have reviewed and discussed with patient certain preventive protocols, quality metrics, and best practice recommendations. A written personalized care plan for preventive services as well as general preventive health recommendations were provided to patient.     Michiel Cowboy, RN   06/18/2018     Medical screening examination/treatment/procedure(s) were performed by non-physician practitioner and as supervising physician I was immediately available for consultation/collaboration. I agree with above. Scarlette Calico, MD

## 2018-06-19 ENCOUNTER — Encounter: Payer: Self-pay | Admitting: Internal Medicine

## 2018-07-12 ENCOUNTER — Other Ambulatory Visit: Payer: Self-pay | Admitting: Internal Medicine

## 2018-07-12 DIAGNOSIS — M1000 Idiopathic gout, unspecified site: Secondary | ICD-10-CM

## 2018-07-12 MED ORDER — ALLOPURINOL 100 MG PO TABS: 100.0000 mg | ORAL_TABLET | Freq: Every day | ORAL | 1 refills | Status: DC

## 2018-07-20 ENCOUNTER — Ambulatory Visit: Payer: Medicare Other

## 2018-07-24 ENCOUNTER — Ambulatory Visit (INDEPENDENT_AMBULATORY_CARE_PROVIDER_SITE_OTHER): Payer: Medicare Other | Admitting: General Practice

## 2018-07-24 DIAGNOSIS — Z7901 Long term (current) use of anticoagulants: Secondary | ICD-10-CM | POA: Diagnosis not present

## 2018-07-24 LAB — POCT INR: INR: 2.5 (ref 2.0–3.0)

## 2018-07-24 NOTE — Patient Instructions (Signed)
Pre visit review using our clinic review tool, if applicable. No additional management support is needed unless otherwise documented below in the visit note.  Continue to take 1/2 tablet daily except 1 tablet on Mon Wed and Fridays.  Re-check in 6 weeks.  

## 2018-08-17 DIAGNOSIS — N401 Enlarged prostate with lower urinary tract symptoms: Secondary | ICD-10-CM | POA: Diagnosis not present

## 2018-08-17 DIAGNOSIS — R351 Nocturia: Secondary | ICD-10-CM | POA: Diagnosis not present

## 2018-08-17 DIAGNOSIS — N281 Cyst of kidney, acquired: Secondary | ICD-10-CM | POA: Diagnosis not present

## 2018-08-17 DIAGNOSIS — R972 Elevated prostate specific antigen [PSA]: Secondary | ICD-10-CM | POA: Diagnosis not present

## 2018-08-20 ENCOUNTER — Other Ambulatory Visit: Payer: Self-pay | Admitting: Urology

## 2018-08-20 ENCOUNTER — Other Ambulatory Visit (HOSPITAL_COMMUNITY): Payer: Self-pay | Admitting: Urology

## 2018-08-20 DIAGNOSIS — N281 Cyst of kidney, acquired: Secondary | ICD-10-CM

## 2018-08-20 DIAGNOSIS — K862 Cyst of pancreas: Secondary | ICD-10-CM

## 2018-09-04 ENCOUNTER — Ambulatory Visit: Payer: Medicare Other

## 2018-09-04 ENCOUNTER — Ambulatory Visit (INDEPENDENT_AMBULATORY_CARE_PROVIDER_SITE_OTHER): Payer: Medicare Other | Admitting: General Practice

## 2018-09-04 DIAGNOSIS — Z7901 Long term (current) use of anticoagulants: Secondary | ICD-10-CM | POA: Diagnosis not present

## 2018-09-04 DIAGNOSIS — I4891 Unspecified atrial fibrillation: Secondary | ICD-10-CM

## 2018-09-04 LAB — POCT INR: INR: 2 (ref 2.0–3.0)

## 2018-09-04 NOTE — Patient Instructions (Signed)
Pre visit review using our clinic review tool, if applicable. No additional management support is needed unless otherwise documented below in the visit note.  Continue to take 1/2 tablet daily except 1 tablet on Mon Wed and Fridays.  Re-check in 6 weeks.  

## 2018-09-15 ENCOUNTER — Other Ambulatory Visit (HOSPITAL_COMMUNITY): Payer: Self-pay | Admitting: Urology

## 2018-09-15 DIAGNOSIS — K862 Cyst of pancreas: Secondary | ICD-10-CM

## 2018-09-17 ENCOUNTER — Ambulatory Visit (HOSPITAL_COMMUNITY)
Admission: RE | Admit: 2018-09-17 | Discharge: 2018-09-17 | Disposition: A | Discharge status: Home / Self Care | Payer: Medicare Other | Source: Ambulatory Visit | Attending: Urology | Admitting: Urology

## 2018-09-17 ENCOUNTER — Other Ambulatory Visit: Payer: Self-pay

## 2018-09-17 DIAGNOSIS — N281 Cyst of kidney, acquired: Secondary | ICD-10-CM | POA: Diagnosis not present

## 2018-09-17 DIAGNOSIS — K449 Diaphragmatic hernia without obstruction or gangrene: Secondary | ICD-10-CM | POA: Diagnosis not present

## 2018-09-17 DIAGNOSIS — K862 Cyst of pancreas: Secondary | ICD-10-CM

## 2018-09-17 LAB — POCT I-STAT CREATININE: Creatinine, Ser: 1.7 mg/dL — ABNORMAL HIGH (ref 0.61–1.24)

## 2018-09-17 MED ORDER — GADOBUTROL 1 MMOL/ML IV SOLN
10.0000 mL | Freq: Once | INTRAVENOUS | Status: AC | PRN
  Administered 2018-09-17: 10 mL via INTRAVENOUS

## 2018-09-30 ENCOUNTER — Other Ambulatory Visit: Payer: Self-pay | Admitting: Internal Medicine

## 2018-09-30 DIAGNOSIS — Z7901 Long term (current) use of anticoagulants: Secondary | ICD-10-CM

## 2018-10-12 ENCOUNTER — Ambulatory Visit (INDEPENDENT_AMBULATORY_CARE_PROVIDER_SITE_OTHER): Payer: Medicare Other | Admitting: General Practice

## 2018-10-12 ENCOUNTER — Other Ambulatory Visit: Payer: Self-pay

## 2018-10-12 DIAGNOSIS — Z7901 Long term (current) use of anticoagulants: Secondary | ICD-10-CM

## 2018-10-12 LAB — POCT INR: INR: 2.2 (ref 2.0–3.0)

## 2018-10-12 NOTE — Patient Instructions (Signed)
Pre visit review using our clinic review tool, if applicable. No additional management support is needed unless otherwise documented below in the visit note.  Continue to take 1/2 tablet daily except 1 tablet on Mon Wed and Fridays.  Re-check in 6 weeks.  

## 2018-11-15 ENCOUNTER — Other Ambulatory Visit: Payer: Self-pay | Admitting: Internal Medicine

## 2018-11-15 MED ORDER — DILTIAZEM HCL ER COATED BEADS 120 MG PO CP24: 120.0000 mg | ORAL_CAPSULE | Freq: Every day | ORAL | 0 refills | Status: DC

## 2018-11-23 ENCOUNTER — Ambulatory Visit (INDEPENDENT_AMBULATORY_CARE_PROVIDER_SITE_OTHER): Payer: Medicare Other | Admitting: General Practice

## 2018-11-23 DIAGNOSIS — Z7901 Long term (current) use of anticoagulants: Secondary | ICD-10-CM | POA: Diagnosis not present

## 2018-11-23 DIAGNOSIS — I4891 Unspecified atrial fibrillation: Secondary | ICD-10-CM

## 2018-11-23 LAB — POCT INR: INR: 3 (ref 2.0–3.0)

## 2018-11-23 NOTE — Patient Instructions (Signed)
Pre visit review using our clinic review tool, if applicable. No additional management support is needed unless otherwise documented below in the visit note.  Continue to take 1/2 tablet daily except 1 tablet on Mon Wed and Fridays.  Re-check in 6 weeks.  

## 2018-12-19 ENCOUNTER — Emergency Department (HOSPITAL_COMMUNITY): Payer: Medicare Other

## 2018-12-19 ENCOUNTER — Emergency Department (HOSPITAL_COMMUNITY)
Admission: EM | Admit: 2018-12-19 | Discharge: 2018-12-19 | Disposition: A | Discharge status: Home / Self Care | Payer: Medicare Other | Attending: Emergency Medicine | Admitting: Emergency Medicine

## 2018-12-19 DIAGNOSIS — N183 Chronic kidney disease, stage 3 (moderate): Secondary | ICD-10-CM | POA: Diagnosis not present

## 2018-12-19 DIAGNOSIS — Z85828 Personal history of other malignant neoplasm of skin: Secondary | ICD-10-CM | POA: Diagnosis not present

## 2018-12-19 DIAGNOSIS — I517 Cardiomegaly: Secondary | ICD-10-CM | POA: Diagnosis not present

## 2018-12-19 DIAGNOSIS — Z7901 Long term (current) use of anticoagulants: Secondary | ICD-10-CM | POA: Diagnosis not present

## 2018-12-19 DIAGNOSIS — R079 Chest pain, unspecified: Secondary | ICD-10-CM

## 2018-12-19 DIAGNOSIS — I2510 Atherosclerotic heart disease of native coronary artery without angina pectoris: Secondary | ICD-10-CM | POA: Diagnosis not present

## 2018-12-19 DIAGNOSIS — Z87891 Personal history of nicotine dependence: Secondary | ICD-10-CM | POA: Insufficient documentation

## 2018-12-19 DIAGNOSIS — S299XXA Unspecified injury of thorax, initial encounter: Secondary | ICD-10-CM | POA: Diagnosis not present

## 2018-12-19 DIAGNOSIS — W1830XA Fall on same level, unspecified, initial encounter: Secondary | ICD-10-CM | POA: Insufficient documentation

## 2018-12-19 DIAGNOSIS — I4891 Unspecified atrial fibrillation: Secondary | ICD-10-CM | POA: Diagnosis not present

## 2018-12-19 DIAGNOSIS — R0781 Pleurodynia: Secondary | ICD-10-CM | POA: Diagnosis not present

## 2018-12-19 DIAGNOSIS — Z79899 Other long term (current) drug therapy: Secondary | ICD-10-CM | POA: Diagnosis not present

## 2018-12-19 DIAGNOSIS — I129 Hypertensive chronic kidney disease with stage 1 through stage 4 chronic kidney disease, or unspecified chronic kidney disease: Secondary | ICD-10-CM | POA: Diagnosis not present

## 2018-12-19 DIAGNOSIS — S2242XA Multiple fractures of ribs, left side, initial encounter for closed fracture: Secondary | ICD-10-CM | POA: Diagnosis not present

## 2018-12-19 DIAGNOSIS — R0789 Other chest pain: Secondary | ICD-10-CM | POA: Insufficient documentation

## 2018-12-19 DIAGNOSIS — K449 Diaphragmatic hernia without obstruction or gangrene: Secondary | ICD-10-CM | POA: Diagnosis not present

## 2018-12-19 LAB — CBC WITH DIFFERENTIAL/PLATELET
Abs Immature Granulocytes: 0.05 K/uL (ref 0.00–0.07)
Basophils Absolute: 0 K/uL (ref 0.0–0.1)
Basophils Relative: 0 %
Eosinophils Absolute: 0.2 K/uL (ref 0.0–0.5)
Eosinophils Relative: 2 %
HCT: 39.8 % (ref 39.0–52.0)
Hemoglobin: 12.5 g/dL — ABNORMAL LOW (ref 13.0–17.0)
Immature Granulocytes: 1 %
Lymphocytes Relative: 7 %
Lymphs Abs: 0.6 K/uL — ABNORMAL LOW (ref 0.7–4.0)
MCH: 31 pg (ref 26.0–34.0)
MCHC: 31.4 g/dL (ref 30.0–36.0)
MCV: 98.8 fL (ref 80.0–100.0)
Monocytes Absolute: 0.8 K/uL (ref 0.1–1.0)
Monocytes Relative: 9 %
Neutro Abs: 7.5 K/uL (ref 1.7–7.7)
Neutrophils Relative %: 81 %
Platelets: 244 K/uL (ref 150–400)
RBC: 4.03 MIL/uL — ABNORMAL LOW (ref 4.22–5.81)
RDW: 14.8 % (ref 11.5–15.5)
WBC: 9.1 K/uL (ref 4.0–10.5)
nRBC: 0 % (ref 0.0–0.2)

## 2018-12-19 LAB — BASIC METABOLIC PANEL WITH GFR
Anion gap: 13 (ref 5–15)
BUN: 32 mg/dL — ABNORMAL HIGH (ref 8–23)
CO2: 24 mmol/L (ref 22–32)
Calcium: 8.8 mg/dL — ABNORMAL LOW (ref 8.9–10.3)
Chloride: 105 mmol/L (ref 98–111)
Creatinine, Ser: 1.71 mg/dL — ABNORMAL HIGH (ref 0.61–1.24)
GFR calc Af Amer: 43 mL/min — ABNORMAL LOW
GFR calc non Af Amer: 37 mL/min — ABNORMAL LOW
Glucose, Bld: 90 mg/dL (ref 70–99)
Potassium: 3.5 mmol/L (ref 3.5–5.1)
Sodium: 142 mmol/L (ref 135–145)

## 2018-12-19 LAB — PROTIME-INR
INR: 2.6 — ABNORMAL HIGH (ref 0.8–1.2)
Prothrombin Time: 27.2 seconds — ABNORMAL HIGH (ref 11.4–15.2)

## 2018-12-19 MED ORDER — HYDROCODONE-ACETAMINOPHEN 5-325 MG PO TABS: 1.0000 | ORAL_TABLET | ORAL | 0 refills | Status: DC | PRN

## 2018-12-19 NOTE — ED Provider Notes (Signed)
[facility] EMERGENCY DEPARTMENT Provider Note   CSN: [id number] Arrival date & time: 12/19/18  [id number]     History   Chief Complaint Chief Complaint  Patient presents with   Fall    HPI Nathan [name] is a 78 y.o. male.with medical history significant for permanent atrial fibrillation (rate controlled and on chronic anticoagulation), HTN, CKDIII and vitamin D deficiency with prior fractures presenting with left lateral chest pain for two days after a mechanical fall on Sunday evening. History obtained by patient and son at bedside. Patient lives by himself and uses a walker for ambulation. He was transitioning from den to living room and missed the step down. He fell on his left side with outstretched arm and denies any head trauma or loss of consciousness. The pain is only at the left lateral wall slightly below the axilla and does not radiate. Pain is positional and is slightly relieved with Tylenol. He denies any headaches or dizziness, lightheadedness, vision changes, shortness of breath, recent URI, fever/chills,  generalized or focal weakness.    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   Permanent atrial fibrillation    PONV (postoperative nausea and vomiting)    no  problems recently    Patient Active Problem List   Diagnosis Date Noted   Long term (current) use of anticoagulants 01/06/2017   Eczema, dyshidrotic 09/07/2016   Chronic anticoagulation 01/20/2015   Abnormality of gait due to impairment of balance 10/30/2014   Hypertriglyceridemia 03/04/2014   Encounter for therapeutic drug monitoring 05/22/2013   Vitamin D deficiency 11/16/2012   Gout 11/16/2012   Kidney  disease, chronic, stage III (GFR 30-59 ml/min) ([place]) 03/18/2009   OSTEOARTHRITIS 03/18/2009   RENAL CYST 06/14/2007   Essential hypertension 09/04/2006   Atrial fibrillation ([place]) 08/31/2006    Past Surgical History:  Procedure Laterality Date   COLONOSCOPY  2003   HERNIA REPAIR     HIATAL HERNIA REPAIR     Dr Kaylyn Lim , laparoscopic   INGUINAL HERNIA REPAIR Left 03/05/2013   Procedure: HERNIA REPAIR INGUINAL ADULT;  Surgeon: [name], MD;  Location: [place];  Service: General;  Laterality: Left;   INSERTION OF MESH Left 03/05/2013   Procedure: INSERTION OF MESH;  Surgeon: [name], MD;  Location: [place];  Service: General;  Laterality: Left;   TOTAL HIP ARTHROPLASTY Right 07   TOTAL KNEE ARTHROPLASTY Right 07      Home Medications    Prior to Admission medications   Medication Sig Start Date End Date Taking? Authorizing Provider  acetaminophen (TYLENOL) 650 MG CR tablet Take 650 mg by mouth every 8 (eight) hours as needed for pain.    [provider]  allopurinol (ZYLOPRIM) 100 MG tablet Take 1 tablet (100 mg total) by mouth daily. 07/12/18   Janith Lima, MD  Dermatological Products, Misc. [facility]) lotion Apply 1 Act topically 2 (two) times daily with a meal. 09/08/16   Janith Lima, MD  diltiazem (CARDIZEM CD) 120 MG 24 hr capsule Take 1 capsule (120 mg total) by mouth daily. Please make yearly appt with Dr. Rayann Heman for  November for future refills. 1st attempt 11/15/18   Thompson Grayer, MD  finasteride (PROSCAR) 5 MG tablet Take 5 mg by mouth daily. Rx'ed by [name]    [provider]  furosemide (LASIX) 40 MG tablet TAKE 1 TABLET BY MOUTH ONCE DAILY 02/26/18   Allred, Jeneen Rinks, MD  lisinopril (PRINIVIL,ZESTRIL) 40 MG tablet Take 1 tablet (40 mg total) by mouth daily. 02/20/18   Baldwin Jamaica, PA-C  metoprolol tartrate (LOPRESSOR) 100 MG tablet Take 1 tablet (100 mg total) by mouth 2 (two) times daily. Please keep upcoming appt  in November for future refills. Thank you 02/13/18   Thompson Grayer, MD  warfarin (COUMADIN) 5 MG tablet TAKE 1/2 TABLET BY MOUTH EVERY DAY EXCEPT TABLET 1 TABLET ON MONDAY, [facility] AND FRIDAY OR AS DIRECTED BY ANTIOAGULATION CLINIC 10/01/18   Janith Lima, MD    Family History Family History  Problem Relation Age of Onset   Hypertension Mother    Skin cancer Mother    Brain cancer Sister    Hypertension Brother    Diabetes Brother    Stroke Neg Hx    Heart disease Neg Hx     Social History Social History   Tobacco Use   Smoking status: Former Smoker    Packs/day: 0.25    Years: 3.00    Pack years: 0.75    Types: Cigarettes    Quit date: 04/17/1991    Years since quitting: 27.6   Smokeless tobacco: Never Used  Substance Use Topics   Alcohol use: Yes    Alcohol/week: 2.0 standard drinks    Types: 2 Glasses of wine per week    Comment: occasionally   Drug use: No     Allergies   Tramadol   Review of Systems Review of Systems  Constitutional: Negative for chills and fever.  Eyes: Negative for blurred vision, double vision and visual disturbance.  Respiratory: Negative for cough, shortness of breath and wheezing.   Cardiovascular: Positive for chest pain. Negative for palpitations.  Gastrointestinal: Negative for abdominal pain, nausea and vomiting.  Genitourinary: Negative for dysuria, frequency and urgency.  Musculoskeletal: Positive for back pain and falls. Negative for joint pain.  Neurological: Positive for weakness. Negative for dizziness, light-headedness, numbness and headaches.  Psychiatric/Behavioral: Negative for confusion and sleep disturbance.  All other systems reviewed and are negative.   Physical Exam Updated Vital Signs BP 129/89    Pulse 87    Temp 98.2 F (36.8 C) (Oral)    Resp 18    SpO2 97%   Physical Exam Constitutional:      General: He is not in acute distress.    Appearance: Normal appearance. He is not ill-appearing.    HENT:     Head: Normocephalic and atraumatic.     Mouth/Throat:     Mouth: Mucous membranes are moist.     Pharynx: Oropharynx is clear. No oropharyngeal exudate or posterior oropharyngeal erythema.  Eyes:     General: No scleral icterus.    Extraocular Movements: Extraocular movements intact.     Conjunctiva/sclera: Conjunctivae normal.     Pupils: Pupils are equal, round, and reactive to light.  Neck:     Musculoskeletal: Normal range of motion and neck supple. No muscular tenderness.  Cardiovascular:     Rate and Rhythm: Normal rate. Rhythm irregular.     Pulses: Normal pulses.     Heart sounds: Normal heart sounds. No murmur. No friction rub. No gallop.   Pulmonary:  Effort: Pulmonary effort is normal. No respiratory distress.     Breath sounds: Normal breath sounds. No wheezing or rhonchi.     Comments: Tenderness to palpation left lateral chest ~2in below axilla  Chest:     Chest wall: Tenderness present.  Abdominal:     General: Bowel sounds are normal. There is no distension.     Palpations: Abdomen is soft.     Tenderness: There is no abdominal tenderness. There is no guarding.  Skin:    General: Skin is warm and dry.     Findings: No bruising, erythema, lesion or rash.  Neurological:     General: No focal deficit present.     Mental Status: He is alert and oriented to person, place, and time. Mental status is at baseline.     Cranial Nerves: No cranial nerve deficit.     Sensory: No sensory deficit.     Motor: No weakness.     Coordination: Coordination normal.    ED Treatments / Results  Labs (all labs ordered are listed, but only abnormal results are displayed) Labs Reviewed  BASIC METABOLIC PANEL - Abnormal; Notable for the following components:      Result Value   BUN 32 (*)    Creatinine, Ser 1.71 (*)    Calcium 8.8 (*)    GFR calc non Af Amer 37 (*)    GFR calc Af Amer 43 (*)    All other components within normal limits  CBC WITH  DIFFERENTIAL/PLATELET - Abnormal; Notable for the following components:   RBC 4.03 (*)    Hemoglobin 12.5 (*)    Lymphs Abs 0.6 (*)    All other components within normal limits  PROTIME-INR - Abnormal; Notable for the following components:   Prothrombin Time 27.2 (*)    INR 2.6 (*)    All other components within normal limits    EKG None  Radiology Dg Chest 2 View  Result Date: 12/19/2018 CLINICAL DATA:  Left rib pain following a fall down steps 3 days ago. EXAM: CHEST - 2 VIEW COMPARISON:  01/21/2015. FINDINGS: Stable mildly enlarged cardiac silhouette. Small to moderate-sized hiatal hernia. Minimal patchy density at the left lung base. Mildly prominent pulmonary vasculature. Left shoulder degenerative changes with superior migration of the humeral head, compatible with a chronic rotator cuff tear. Thoracic spine degenerative changes. No fracture or pneumothorax seen. IMPRESSION: 1. Minimal patchy atelectasis or pneumonia at the left lung base. 2. Stable mild cardiomegaly and mild pulmonary vascular congestion. 3. Small to moderate-sized hiatal hernia. Electronically Signed   By: Claudie Revering M.D.   On: 12/19/2018 11:05   Ct Chest Wo Contrast  Result Date: 12/19/2018 CLINICAL DATA:  78 year old male with history of trauma from a fall complaining of chest wall pain. Possible rib fracture. EXAM: CT CHEST WITHOUT CONTRAST TECHNIQUE: Multidetector CT imaging of the chest was performed following the standard protocol without IV contrast. COMPARISON:  No priors. FINDINGS: Cardiovascular: Heart size is mildly enlarged with biatrial dilatation. Trace amount of pericardial fluid and/or thickening, unlikely to be of any hemodynamic significance at this time. No associated pericardial calcification. There is aortic atherosclerosis, as well as atherosclerosis of the great vessels of the mediastinum and the coronary arteries, including calcified atherosclerotic plaque in the left main, left anterior  descending, left circumflex and right coronary arteries. Calcifications of the aortic valve. Mild aneurysmal dilatation of the ascending thoracic aorta (4.5 cm in diameter). Mediastinum/Nodes: No pathologically enlarged mediastinal or hilar lymph nodes.  Moderate-sized hiatal hernia. No axillary lymphadenopathy. Lungs/Pleura: No pneumothorax. No acute consolidative airspace disease. No pleural effusions. Scattered areas of mild septal thickening most evident throughout the mid to lower lungs bilaterally, particularly in the periphery, potentially indicative of very early fibrosis. Upper Abdomen: Multiple low-attenuation lesions throughout the visualized liver, spleen and kidneys, incompletely characterized on today's noncontrast CT examination, but statistically likely to represent cysts. The largest of these lesions is in the upper pole of the right kidney measuring 9.7 cm in diameter with peripheral calcifications, potentially a complex cyst, in need of further characterization. Musculoskeletal: Nondisplaced fractures of the anterolateral left third and fourth ribs. Multiple old healed bilateral rib fractures. There are no aggressive appearing lytic or blastic lesions noted in the visualized portions of the skeleton. IMPRESSION: 1. Nondisplaced fractures of the anterolateral aspect of the left third and fourth ribs. No pneumothorax. 2. Cardiomegaly with biatrial dilatation. 3. Moderate hiatal hernia. 4. Aortic atherosclerosis, in addition to left main and 3 vessel coronary artery disease. 5. There are calcifications of the aortic valve. Echocardiographic correlation for evaluation of potential valvular dysfunction may be warranted if clinically indicated. 6. 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 7. 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 Nonemergent outpatient referral to Pulmonology for further evaluation is suggested. Aortic Atherosclerosis (ICD10-I70.0).  Electronically Signed   By: Vinnie Langton M.D.   On: 12/19/2018 14:14    Procedures Procedures (including critical care time)  Medications Ordered in ED Medications - No data to display   Initial Impression / Assessment and Plan / ED Course  I have reviewed the triage vital signs and the nursing notes.  Pertinent labs & imaging results that were available during my care of the patient were reviewed by me and considered in my medical decision making (see chart for details).  Patient is 78 yr old male with PMHx of atrial fibrillation rate controlled and on chronic AC, HTN, vitamin D deficiency with prior fractures presenting with two days left lateral chest pain after a mechanical fall on Sunday evening. Pain is localized to the left lateral chest slightly below the left axilla, positional, and is reproducible on examination. No bruising noted. CT chest with nondisplaced fractures of anterolateral aspect of left 3rd and 5th ribs w/o pneumothorax. As today is day 3 of symptoms and patient continues to be hemodynamically stable, does not require inpatient evaluation at this time. Will discharge patient with pain meds and incentive spirometer and have patient follow up with PCP in one week.  Ddx -  1. ACS: chest pain is not retrosternal, unrelated to exertion. It is positional and reproducible on examinations.  2. Pneumothorax: HDS, no dyspnea or fast, shallow breathing noted; breath sounds equal bilaterally. CXR and CT chest negative for pneumothorax.  2. Pneumonia: no fevers/chills or cough.  Although CXR with minimal patchy atelectasis vs pneumonia at left lung base, CT with concerns of early fibrosis.    Incidental CT findings:  1. Cardiomegaly w/biatrial dilation and aortic atherosclerosis with left main and 3 vessel CAD with calcifications of aortic valve - need Echo for potential valvular dysfunction  2. Aneurysmal dilatation of ascending thoracic aorta (4.5cm in diameter) - need f/u  with [facility] 3. Findings in lung bases concerning for early fibrosis - need f/u with pulm  Plan: Discharge to home with pain meds and incentive spirometer. Instructed to follow up with PCP for rib fractures and need for Echo, [facility] to evaluate ascending aorta aneurysm, and  pulm for evaluation of early fibrosis  *Instructions given to follow up with the necessary medical providers above and to seek immediate medical attention if symptoms worsen or develop respiratory distress, weakness, fever/chills.   Final Clinical Impressions(s) / ED Diagnoses   Final diagnoses:  Chest pain    ED Discharge Orders    None       Harvie Heck, MD 12/19/18 [place], Wenda Overland, MD 12/21/18 [id number]

## 2018-12-19 NOTE — ED Triage Notes (Signed)
Pt states he fell walking down steps on Sunday and fell onto left rib cage. Pt having pain to left side of ribs. Pt denies hitting head or any LOC

## 2018-12-19 NOTE — Discharge Instructions (Signed)
Nathan [name],  You came to ED with left sided chest pain after a fall at home on Sunday. Imaging showed that you fractured your 3rd and 4th ribs. You are being prescribed pain medications and given an incentive spirometer. Please use as instructed.   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   Thank you

## 2018-12-19 NOTE — ED Notes (Signed)
Pt denies SOB at rest, when he moves the pain in his Lt side (per pt) makes it hard to breath.

## 2018-12-19 NOTE — ED Notes (Signed)
Patient verbalizes understanding of discharge instructions. Opportunity for questioning and answers were provided. Armband removed by staff, pt discharged from ED via wheelchair with son and incentive spirometer

## 2018-12-21 ENCOUNTER — Encounter: Payer: Self-pay | Admitting: Internal Medicine

## 2018-12-21 ENCOUNTER — Telehealth: Payer: Self-pay | Admitting: Surgery

## 2018-12-21 NOTE — Telephone Encounter (Signed)
error 

## 2018-12-21 NOTE — Telephone Encounter (Signed)
°  [facility] sent over referral on patient. After Nathan [name] reviewed the notes from the referral patient has ascending thoracic aorta. Patient would need to be seen at [facility], not out office. I called patient and informed him and gave him the phone number, Chanda closed referral.   Vaughan Basta

## 2018-12-25 ENCOUNTER — Telehealth: Payer: Self-pay | Admitting: Internal Medicine

## 2018-12-25 NOTE — Telephone Encounter (Signed)
New message   Patient has been referred to a vascular surgeon. Please call to discuss if the patient needs a referral to a vascular surgeon per the patient's significant other.

## 2018-12-28 ENCOUNTER — Other Ambulatory Visit: Payer: Self-pay | Admitting: General Practice

## 2018-12-28 DIAGNOSIS — Z7901 Long term (current) use of anticoagulants: Secondary | ICD-10-CM

## 2018-12-31 ENCOUNTER — Encounter: Payer: Self-pay | Admitting: Internal Medicine

## 2018-12-31 ENCOUNTER — Other Ambulatory Visit: Payer: Self-pay

## 2018-12-31 ENCOUNTER — Other Ambulatory Visit (INDEPENDENT_AMBULATORY_CARE_PROVIDER_SITE_OTHER): Payer: Medicare Other

## 2018-12-31 ENCOUNTER — Other Ambulatory Visit: Payer: Medicare Other

## 2018-12-31 ENCOUNTER — Ambulatory Visit (INDEPENDENT_AMBULATORY_CARE_PROVIDER_SITE_OTHER): Payer: Medicare Other | Admitting: Internal Medicine

## 2018-12-31 VITALS — BP 140/82 | HR 78 | Temp 98.2°F | Ht 72.0 in | Wt 199.2 lb

## 2018-12-31 DIAGNOSIS — N183 Chronic kidney disease, stage 3 unspecified: Secondary | ICD-10-CM

## 2018-12-31 DIAGNOSIS — I712 Thoracic aortic aneurysm, without rupture, unspecified: Secondary | ICD-10-CM | POA: Insufficient documentation

## 2018-12-31 DIAGNOSIS — I10 Essential (primary) hypertension: Secondary | ICD-10-CM

## 2018-12-31 DIAGNOSIS — D539 Nutritional anemia, unspecified: Secondary | ICD-10-CM

## 2018-12-31 DIAGNOSIS — Z23 Encounter for immunization: Secondary | ICD-10-CM | POA: Diagnosis not present

## 2018-12-31 DIAGNOSIS — Z7901 Long term (current) use of anticoagulants: Secondary | ICD-10-CM

## 2018-12-31 LAB — CBC WITH DIFFERENTIAL/PLATELET
Basophils Absolute: 0 10*3/uL (ref 0.0–0.1)
Basophils Relative: 0.4 % (ref 0.0–3.0)
Eosinophils Absolute: 0.2 10*3/uL (ref 0.0–0.7)
Eosinophils Relative: 2.9 % (ref 0.0–5.0)
HCT: 37.9 % — ABNORMAL LOW (ref 39.0–52.0)
Hemoglobin: 12.5 g/dL — ABNORMAL LOW (ref 13.0–17.0)
Lymphocytes Relative: 11.8 % — ABNORMAL LOW (ref 12.0–46.0)
Lymphs Abs: 0.9 10*3/uL (ref 0.7–4.0)
MCHC: 32.9 g/dL (ref 30.0–36.0)
MCV: 94.7 fl (ref 78.0–100.0)
Monocytes Absolute: 0.5 10*3/uL (ref 0.1–1.0)
Monocytes Relative: 6.8 % (ref 3.0–12.0)
Neutro Abs: 5.7 10*3/uL (ref 1.4–7.7)
Neutrophils Relative %: 78.1 % — ABNORMAL HIGH (ref 43.0–77.0)
Platelets: 265 10*3/uL (ref 150.0–400.0)
RBC: 4 Mil/uL — ABNORMAL LOW (ref 4.22–5.81)
RDW: 15.4 % (ref 11.5–15.5)
WBC: 7.2 10*3/uL (ref 4.0–10.5)

## 2018-12-31 LAB — FOLATE: Folate: >24.7 ng/mL (ref >5.9)

## 2018-12-31 LAB — RETICULOCYTES
ABS Retic: 57400 cells/uL (ref 25000–9000)
Retic Ct Pct: 1.4 %

## 2018-12-31 LAB — PROTIME-INR
INR: 3.8 ratio — ABNORMAL HIGH (ref 0.8–1.0)
Prothrombin Time: 43.5 s — ABNORMAL HIGH (ref 9.6–13.1)

## 2018-12-31 LAB — IBC PANEL
Iron: 75 ug/dL (ref 42–165)
Saturation Ratios: 32.7 % (ref 20.0–50.0)
Transferrin: 164 mg/dL — ABNORMAL LOW (ref 212.0–360.0)

## 2018-12-31 LAB — VITAMIN B12: Vitamin B-12: 280 pg/mL (ref 211–911)

## 2018-12-31 LAB — FERRITIN: Ferritin: 101.4 ng/mL (ref 22.0–322.0)

## 2018-12-31 NOTE — Patient Instructions (Signed)
Thoracic Aortic Aneurysm  An aneurysm is a bulge in an artery. It happens when blood pushes up against a weakened or damaged artery wall. A thoracic aortic aneurysm is an aneurysm that occurs in the first part of the aorta, between the heart and the diaphragm. The aorta is the main artery of the body. It supplies blood from the heart to the rest of the body. Some aneurysms may not cause symptoms or problems. However, a thoracic aortic aneurysm can cause two serious problems:  It can enlarge and burst (rupture).  It can cause blood to flow between the layers of the wall of the aorta through a tear (aortic dissection). Both of these problems are medical emergencies. They can cause bleeding inside the body and can be life-threatening if they are not diagnosed and treated right away. What are the causes? The exact cause of this condition is not known. What increases the risk? The following factors may make you more likely to develop this condition:  Being 65 years of age or older.  Having a family history of aneurysms.  Using tobacco.  Having any of these conditions: ? Hardening of the arteries caused by the buildup of fat and other substances in the lining of a blood vessel (arteriosclerosis). ? Inflammation of the walls of an artery (arteritis). ? A genetic disease that weakens the body's connective tissue, such as Marfan syndrome. ? An injury or trauma to the aorta. ? High blood pressure (hypertension). ? High cholesterol. ? An infection from bacteria, such as syphilis or staphylococcus, in the wall of the aorta (infectious aortitis). What are the signs or symptoms? Symptoms of this condition vary depending on the size of the aneurysm and how fast it is growing. Most grow slowly and do not cause symptoms. When symptoms do occur, they may include:  Pain in the chest, back, sides, or abdomen. The pain may vary in intensity. Sudden, severe pain may indicate that the aneurysm has ruptured.   Hoarseness.  Cough.  Shortness of breath.  Swallowing problems.  Swelling in the face, arms, or legs.  Fever.  Unexplained weight loss. How is this diagnosed? This condition may be diagnosed with:  An ultrasound.  X-rays.  CT scan.  MRI.  A test to check the arteries for damage or blockages (angiogram). Most unruptured thoracic aortic aneurysms cause no symptoms, so they are often found during exams for other conditions. How is this treated? Treatment for this condition depends on:  The size of the aneurysm.  How fast the aneurysm is growing.  Your age.  Risk factors for rupture. Small aneurysms (2.2 inches, or 5.5 cm, or less) may be managed with:  Medicines to: ? Control blood pressure. ? Manage pain. ? Fight infection.  Regular monitoring. This may include an ultrasound or CT scan every year or every 6 months to see if the aneurysm is getting bigger. Large or fast-growing aneurysms may be treated with surgery. Follow these instructions at home: Eating and drinking   Eat a healthy diet. Your health care provider may recommend that you: ? Lower your salt (sodium) intake. In some people, too much salt can raise blood pressure and increase the risk for thoracic aortic aneurysm. ? Avoid foods that are high in saturated fat and cholesterol, such as red meat and full-fat dairy. ? Eat a diet that is low in sugar. ? Increase your fiber intake by including whole grains, vegetables, and fruits in your diet. Eating these foods may help to lower your   blood pressure.  Do not drink alcohol if your health care provider tells you not to drink.  If you drink alcohol: ? Limit how much you use to:  0-1 drink a day for women.  0-2 drinks a day for men. ? Be aware of how much alcohol is in your drink. In the [place] one drink equals one 12 oz bottle of beer (355 mL), one 5 oz glass of wine (148 mL), or one 1 oz glass of hard liquor (44 mL). Lifestyle  Do not use any  products that contain nicotine or tobacco, such as cigarettes, e-cigarettes, and chewing tobacco. If you need help quitting, ask your health care provider.  Maintain a healthy weight.  Check your blood pressure regularly. Follow your health care provider's instructions on how to keep your blood pressure within normal limits.  Have your blood sugar (glucose) level and cholesterol levels checked regularly. Follow your health care provider's instructions on how to keep levels within normal limits. Activity   Stay physically active and exercise regularly. Talk with your health care provider about how often you should exercise and ask which types of exercise are best for you.  Avoid heavy lifting and activities that take a lot of effort. Ask your health care provider what activities are safe for you. General instructions  Take over-the-counter and prescription medicines only as told by your health care provider.  Talk with your health care provider about regular screenings to see if the aneurysm is getting bigger.  Keep all follow-up visits as told by your health care provider. This is important. Contact a health care provider if you have:  Unexplained weight loss. Get help right away if you have:  Pain in your upper back, neck, or abdomen. This pain may move into your chest and arms.  Trouble swallowing.  A cough or hoarseness.  Shortness of breath. Summary  A thoracic aortic aneurysm is an aneurysm that occurs in the first part of the aorta, between the heart and the diaphragm.  As a thoracic aortic aneurysm becomes larger, it can burst (rupture), or blood can flow between the layers of the wall of the aorta through a tear (aorticdissection). These conditions can be life-threatening if they are not diagnosed and treated right away.  If you have a thoracic aortic aneurysm, its growth will be closely monitored. Surgical repair may be needed for larger or faster-growing aneurysms.  This information is not intended to replace advice given to you by your health care provider. Make sure you discuss any questions you have with your health care provider. Document Released: 04/04/2005 Document Revised: 11/21/2017 Document Reviewed: 11/22/2017 Elsevier Patient Education  2020 [employer].

## 2018-12-31 NOTE — Progress Notes (Signed)
Subjective:  Patient ID: Nathan [name], male    DOB: 08/25/1940  Age: 78 y.o. MRN: [id number]  CC: Anemia   HPI Nathan [name] presents for f/up - He recently sustained a fall and landed on his left rib cage.  He was seen in the ED and a CT scan revealed two fractured ribs on the left side.  He tells me the left-sided chest wall pain has gotten much better.  There was an incidental finding of 4.5 cm ascending thoracic aortic aneurysm.  He denies any chest pain in this area and does not experience shortness of breath.  His wife is with him today and she wants him to see a thoracic surgeon.  He was also found to be mildly anemic during his ED visit.  Outpatient Medications Prior to Visit  Medication Sig Dispense Refill   acetaminophen (TYLENOL) 650 MG CR tablet Take 650 mg by mouth every 8 (eight) hours as needed for pain.     allopurinol (ZYLOPRIM) 100 MG tablet Take 1 tablet (100 mg total) by mouth daily. 90 tablet 1   Dermatological Products, Misc. [facility]) lotion Apply 1 Act topically 2 (two) times daily with a meal. 100 g 11   diltiazem (CARDIZEM CD) 120 MG 24 hr capsule Take 1 capsule (120 mg total) by mouth daily. Please make yearly appt with Dr. Rayann Heman for November for future refills. 1st attempt 90 capsule 0   finasteride (PROSCAR) 5 MG tablet Take 5 mg by mouth daily. Rx'ed by [name]     furosemide (LASIX) 40 MG tablet TAKE 1 TABLET BY MOUTH ONCE DAILY 90 tablet 3   HYDROcodone-acetaminophen (NORCO/VICODIN) 5-325 MG tablet Take 1 tablet by mouth every 4 (four) hours as needed for moderate pain. 12 tablet 0   lisinopril (PRINIVIL,ZESTRIL) 40 MG tablet Take 1 tablet (40 mg total) by mouth daily. 90 tablet 2   metoprolol tartrate (LOPRESSOR) 100 MG tablet Take 1 tablet (100 mg total) by mouth 2 (two) times daily. Please keep upcoming appt in November for future refills. Thank you 180 tablet 0   warfarin (COUMADIN) 5 MG tablet TAKE 1/2 TABLET BY MOUTH EVERY DAY EXCEPT  TABLET 1 TABLET ON MONDAY, WEDNESDAY AND FRIDAY OR AS DIRECTED BY ANTIOAGULATION CLINIC 30 tablet 3   No facility-administered medications prior to visit.     ROS Review of Systems  Constitutional: Negative for diaphoresis, fatigue and unexpected weight change.  HENT: Negative.   Eyes: Negative for visual disturbance.  Respiratory: Negative for cough, chest tightness, shortness of breath and wheezing.   Cardiovascular: Positive for chest pain (left lateral area). Negative for palpitations and leg swelling.  Gastrointestinal: Negative for abdominal pain, anal bleeding, blood in stool, constipation, diarrhea, nausea and vomiting.  Endocrine: Negative.   Genitourinary: Negative.  Negative for difficulty urinating and hematuria.  Musculoskeletal: Negative.  Negative for arthralgias, back pain, myalgias and neck pain.  Skin: Negative.  Negative for color change and pallor.  Neurological: Negative.  Negative for dizziness and weakness.  Hematological: Negative for adenopathy. Does not bruise/bleed easily.  Psychiatric/Behavioral: Negative.     Objective:  BP 140/82 (BP Location: Left Arm, Patient Position: Sitting, Cuff Size: Normal)    Pulse 78    Temp 98.2 F (36.8 C) (Oral)    Ht 6' (1.829 m)    Wt 199 lb 4 oz (90.4 kg)    SpO2 99%    BMI 27.02 kg/m   BP Readings from Last 3 Encounters:  12/31/18 140/82  12/19/18 128/89  06/18/18 126/64    Wt Readings from Last 3 Encounters:  12/31/18 199 lb 4 oz (90.4 kg)  06/18/18 204 lb (92.5 kg)  06/18/18 204 lb (92.5 kg)    Physical Exam Vitals signs reviewed.  Constitutional:      General: He is not in acute distress.    Appearance: He is not ill-appearing, toxic-appearing or diaphoretic.     Comments: Confused, frail, elderly male who is wheelchair-bound  HENT:     Nose: Nose normal.     Mouth/Throat:     Mouth: Mucous membranes are moist.  Eyes:     General: No scleral icterus.    Conjunctiva/sclera: Conjunctivae normal.    Neck:     Musculoskeletal: Normal range of motion. No neck rigidity or muscular tenderness.  Cardiovascular:     Rate and Rhythm: Normal rate. Rhythm irregularly irregular.     Heart sounds: Murmur present. Systolic murmur present with a grade of 1/6. No diastolic murmur. No friction rub. No gallop.   Pulmonary:     Effort: Pulmonary effort is normal.     Breath sounds: No stridor. No wheezing, rhonchi or rales.  Chest:     Chest wall: No mass, deformity, tenderness, crepitus or edema.  Abdominal:     General: Abdomen is protuberant. Bowel sounds are normal. There is no distension.     Palpations: Abdomen is soft. There is no hepatomegaly, splenomegaly or mass.     Tenderness: There is no abdominal tenderness.  Musculoskeletal:     Right lower leg: No edema.     Left lower leg: No edema.  Skin:    General: Skin is warm and dry.  Neurological:     General: No focal deficit present.     Mental Status: He is alert.  Psychiatric:        Mood and Affect: Mood normal.        Behavior: Behavior normal. Behavior is cooperative.     Lab Results  Component Value Date   WBC 7.2 12/31/2018   HGB 12.5 (L) 12/31/2018   HCT 37.9 (L) 12/31/2018   PLT 265.0 12/31/2018   GLUCOSE 90 12/19/2018   CHOL 148 06/18/2018   TRIG 127.0 06/18/2018   HDL 47.10 06/18/2018   LDLCALC 76 06/18/2018   ALT 11 06/18/2018   AST 12 06/18/2018   NA 142 12/19/2018   K 3.5 12/19/2018   CL 105 12/19/2018   CREATININE 1.71 (H) 12/19/2018   BUN 32 (H) 12/19/2018   CO2 24 12/19/2018   TSH 3.12 06/18/2018   INR 3.8 (H) 12/31/2018   HGBA1C 5.4 01/20/2015    Dg Chest 2 View  Result Date: 12/19/2018 CLINICAL DATA:  Left rib pain following a fall down steps 3 days ago. EXAM: CHEST - 2 VIEW COMPARISON:  01/21/2015. FINDINGS: Stable mildly enlarged cardiac silhouette. Small to moderate-sized hiatal hernia. Minimal patchy density at the left lung base. Mildly prominent pulmonary vasculature. Left shoulder  degenerative changes with superior migration of the humeral head, compatible with a chronic rotator cuff tear. Thoracic spine degenerative changes. No fracture or pneumothorax seen. IMPRESSION: 1. Minimal patchy atelectasis or pneumonia at the left lung base. 2. Stable mild cardiomegaly and mild pulmonary vascular congestion. 3. Small to moderate-sized hiatal hernia. Electronically Signed   By: Claudie Revering M.D.   On: 12/19/2018 11:05   Ct Chest Wo Contrast  Result Date: 12/19/2018 CLINICAL DATA:  78 year old male with history of trauma from a fall complaining of  chest wall pain. Possible rib fracture. EXAM: CT CHEST WITHOUT CONTRAST TECHNIQUE: Multidetector CT imaging of the chest was performed following the standard protocol without IV contrast. COMPARISON:  No priors. FINDINGS: Cardiovascular: Heart size is mildly enlarged with biatrial dilatation. Trace amount of pericardial fluid and/or thickening, unlikely to be of any hemodynamic significance at this time. No associated pericardial calcification. There is aortic atherosclerosis, as well as atherosclerosis of the great vessels of the mediastinum and the coronary arteries, including calcified atherosclerotic plaque in the left main, left anterior descending, left circumflex and right coronary arteries. Calcifications of the aortic valve. Mild aneurysmal dilatation of the ascending thoracic aorta (4.5 cm in diameter). Mediastinum/Nodes: No pathologically enlarged mediastinal or hilar lymph nodes. Moderate-sized hiatal hernia. No axillary lymphadenopathy. Lungs/Pleura: No pneumothorax. No acute consolidative airspace disease. No pleural effusions. Scattered areas of mild septal thickening most evident throughout the mid to lower lungs bilaterally, particularly in the periphery, potentially indicative of very early fibrosis. Upper Abdomen: Multiple low-attenuation lesions throughout the visualized liver, spleen and kidneys, incompletely characterized on today's  noncontrast CT examination, but statistically likely to represent cysts. The largest of these lesions is in the upper pole of the right kidney measuring 9.7 cm in diameter with peripheral calcifications, potentially a complex cyst, in need of further characterization. Musculoskeletal: Nondisplaced fractures of the anterolateral left third and fourth ribs. Multiple old healed bilateral rib fractures. There are no aggressive appearing lytic or blastic lesions noted in the visualized portions of the skeleton. IMPRESSION: 1. Nondisplaced fractures of the anterolateral aspect of the left third and fourth ribs. No pneumothorax. 2. Cardiomegaly with biatrial dilatation. 3. Moderate hiatal hernia. 4. Aortic atherosclerosis, in addition to left main and 3 vessel coronary artery disease. 5. There are calcifications of the aortic valve. Echocardiographic correlation for evaluation of potential valvular dysfunction may be warranted if clinically indicated. 6. 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 7. 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 Nonemergent outpatient referral to Pulmonology for further evaluation is suggested. Aortic Atherosclerosis (ICD10-I70.0). Electronically Signed   By: Vinnie Langton M.D.   On: 12/19/2018 14:14    Assessment & Plan:   Idriss was seen today for anemia.  Diagnoses and all orders for this visit:  Need for influenza vaccination -     Flu Vaccine QUAD High Dose(Fluad)  Essential hypertension- His blood pressure is adequately well controlled.  Kidney disease, chronic, stage III (GFR 30-59 ml/min) ([place])- His renal function is stable.  He was encouraged to avoid nephrotoxic agents.  Deficiency anemia- He has developed a mild normochromic normocytic anemia.  Vitamin levels are normal.  When he returns I will screen him for GI sources of blood loss. -     CBC with Differential/Platelet; Future -     Vitamin B12; Future -      IBC panel; Future -     Folate; Future -     Ferritin; Future -     Reticulocytes; Future  Thoracic aortic aneurysm, without rupture ([place])- The aneurysm only 4.5 cm and is asymptomatic.  I reassured the patient and his wife that I do not think he will need surgical intervention at this time but have referred him to CT surgery for a second opinion. -     Ambulatory referral to Cardiothoracic Surgery   I am having Ansel Bong maintain his finasteride, acetaminophen, EpiCeram, metoprolol tartrate, lisinopril, furosemide, allopurinol, warfarin, diltiazem, and HYDROcodone-acetaminophen.  No orders of the defined  types were placed in this encounter.    Follow-up: Return in about 6 months (around 06/30/2019).  Scarlette Calico, MD

## 2019-01-01 ENCOUNTER — Ambulatory Visit (INDEPENDENT_AMBULATORY_CARE_PROVIDER_SITE_OTHER): Payer: Medicare Other | Admitting: General Practice

## 2019-01-01 DIAGNOSIS — Z7901 Long term (current) use of anticoagulants: Secondary | ICD-10-CM | POA: Diagnosis not present

## 2019-01-01 DIAGNOSIS — I4891 Unspecified atrial fibrillation: Secondary | ICD-10-CM

## 2019-01-01 NOTE — Patient Instructions (Signed)
Pre visit review using our clinic review tool, if applicable. No additional management support is needed unless otherwise documented below in the visit note.  Hold coumadin today (9/15) and take 1/2 tablet tomorrow (9/16).  On Thursday continue to take 1/2 tablet daily except 1 tablet on Mon Wed and Fridays.  Re-check in 3 to 4 weeks.  Patient's INR was drawn in the lab on 9/14. LMOVM with dosing instructions and asked patient to call  C. Luciana Axe, RN to verbalize understanding.

## 2019-01-01 NOTE — Progress Notes (Signed)
I have reviewed and agree.

## 2019-01-04 ENCOUNTER — Other Ambulatory Visit: Payer: Self-pay | Admitting: Internal Medicine

## 2019-01-04 ENCOUNTER — Ambulatory Visit: Payer: Medicare Other

## 2019-01-04 DIAGNOSIS — M1000 Idiopathic gout, unspecified site: Secondary | ICD-10-CM

## 2019-01-04 MED ORDER — ALLOPURINOL 100 MG PO TABS: 100.0000 mg | ORAL_TABLET | Freq: Every day | ORAL | 1 refills | Status: DC

## 2019-01-22 ENCOUNTER — Institutional Professional Consult (permissible substitution): Payer: Medicare Other | Admitting: Thoracic Surgery (Cardiothoracic Vascular Surgery)

## 2019-01-22 ENCOUNTER — Other Ambulatory Visit: Payer: Self-pay

## 2019-01-22 ENCOUNTER — Encounter: Payer: Self-pay | Admitting: Thoracic Surgery (Cardiothoracic Vascular Surgery)

## 2019-01-22 VITALS — BP 144/89 | HR 73 | Temp 97.0°F | Resp 20 | Ht 72.0 in | Wt 200.0 lb

## 2019-01-22 DIAGNOSIS — I712 Thoracic aortic aneurysm, without rupture: Secondary | ICD-10-CM

## 2019-01-22 DIAGNOSIS — I7121 Aneurysm of the ascending aorta, without rupture: Secondary | ICD-10-CM

## 2019-01-22 NOTE — Progress Notes (Signed)
PCP is Janith Lima, MD Referring Provider is Harvie Heck, MD  Chief Complaint  Patient presents with  . Thoracic Aortic Aneurysm    Surgical eval, Chest CT 12/19/18    HPI: Mr. Nathan [name] is a sent for consultation regarding an ascending aneurysm  Nathan [name] is an 78 year old man with a past medical history significant for hypertension, permanent atrial fibrillation, hiatal hernia, degenerative joint disease, and chronic renal impairment.  He recently suffered a fall over a step hitting his left chest.  He did not have any loss of consciousness.  He had some persistent left-sided chest pain and went to the emergency room on 12/19/2018.  He had a CT of the chest which showed nondisplaced fractures of the third and fourth ribs on the left side.  He was also noted to have coronary and thoracic aortic atherosclerosis with calcification.  He had a 4.5 cm ascending aorta.  He does have some pain from the rib fracture still.  He is not having any other chest pain, pressure, or tightness.  He is ambulating with a walker.  He is very hard of hearing.  Past Medical History:  Diagnosis Date  . Cancer ([place])    skin  . Chronic renal impairment   . Diverticulosis   . DJD (degenerative joint disease)   . Elevated PSA    Dr Junious Silk  . H/O hiatal hernia   . Hypertension   . 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 Permanent atrial fibrillation ([place])   . PONV (postoperative nausea and vomiting)    no  problems recently    Past Surgical History:  Procedure Laterality Date  . COLONOSCOPY  2003  . HERNIA REPAIR    . HIATAL HERNIA REPAIR     Dr Kaylyn Lim , laparoscopic  . INGUINAL HERNIA REPAIR Left 03/05/2013   Procedure: HERNIA REPAIR INGUINAL ADULT;  Surgeon: [name], MD;  Location: [facility];  Service: General;  Laterality: Left;  . INSERTION OF MESH Left 03/05/2013   Procedure: INSERTION OF MESH;  Surgeon: [name], MD;   Location: [facility];  Service: General;  Laterality: Left;  . TOTAL HIP ARTHROPLASTY Right 07  . TOTAL KNEE ARTHROPLASTY Right 07    Family History  Problem Relation Age of Onset  . Hypertension Mother   . Skin cancer Mother   . Brain cancer Sister   . Hypertension Brother   . Diabetes Brother   . Stroke Neg Hx   . Heart disease Neg Hx     Social History Social History   Tobacco Use  . Smoking status: Former Smoker    Packs/day: 0.25    Years: 3.00    Pack years: 0.75    Types: Cigarettes    Quit date: 04/17/1991    Years since quitting: 27.7  . Smokeless tobacco: Never Used  Substance Use Topics  . Alcohol use: Yes    Alcohol/week: 2.0 standard drinks    Types: 2 Glasses of wine per week    Comment: occasionally  . Drug use: No    Current Outpatient Medications  Medication Sig Dispense Refill  . acetaminophen (TYLENOL) 650 MG CR tablet Take 650 mg by mouth every 8 (eight) hours as needed for pain.    Marland Kitchen allopurinol (ZYLOPRIM) 100 MG tablet Take 1 tablet (100 mg total) by mouth daily. 90 tablet 1  . diltiazem (CARDIZEM CD) 120 MG 24 hr capsule Take 1  capsule (120 mg total) by mouth daily. Please make yearly appt with Dr. Rayann Heman for November for future refills. 1st attempt 90 capsule 0  . finasteride (PROSCAR) 5 MG tablet Take 5 mg by mouth daily. Rx'ed by [name]    . furosemide (LASIX) 40 MG tablet TAKE 1 TABLET BY MOUTH ONCE DAILY 90 tablet 3  . HYDROcodone-acetaminophen (NORCO/VICODIN) 5-325 MG tablet Take 1 tablet by mouth every 4 (four) hours as needed for moderate pain. 12 tablet 0  . lisinopril (PRINIVIL,ZESTRIL) 40 MG tablet Take 1 tablet (40 mg total) by mouth daily. 90 tablet 2  . metoprolol tartrate (LOPRESSOR) 100 MG tablet Take 1 tablet (100 mg total) by mouth 2 (two) times daily. Please keep upcoming appt in November for future refills. Thank you 180 tablet 0  . warfarin (COUMADIN) 5 MG tablet TAKE 1/2 TABLET BY MOUTH EVERY DAY EXCEPT TABLET 1 TABLET ON  MONDAY, WEDNESDAY AND FRIDAY OR AS DIRECTED BY ANTIOAGULATION CLINIC 30 tablet 3  . Dermatological Products, Misc. [facility]) lotion Apply 1 Act topically 2 (two) times daily with a meal. (Patient not taking: Reported on 01/22/2019) 100 g 11   No current facility-administered medications for this visit.     Allergies  Allergen Reactions  . Tramadol Nausea And Vomiting    Review of Systems  Constitutional: Positive for activity change (Limited). Negative for chills and fever.  HENT: Negative for trouble swallowing and voice change.   Eyes: Negative for visual disturbance.  Respiratory: Negative for shortness of breath.   Cardiovascular: Positive for chest pain (Upper left chest wall) and palpitations. Negative for leg swelling.  Musculoskeletal: Positive for arthralgias and joint swelling.  Neurological: Positive for dizziness. Negative for syncope.    BP (!) 144/89 (BP Location: Right Arm, Patient Position: Sitting)   Pulse 73   Temp (!) 97 F (36.1 C) (Skin)   Resp 20   Ht 6' (1.829 m)   Wt 200 lb (90.7 kg)   SpO2 96% Comment: RA  BMI 27.12 kg/m  Physical Exam Vitals signs reviewed.  Constitutional:      General: He is not in acute distress.    Comments: Elderly  HENT:     Head: Normocephalic and atraumatic.  Eyes:     General: No scleral icterus.    Extraocular Movements: Extraocular movements intact.  Neck:     Vascular: No carotid bruit.  Cardiovascular:     Rate and Rhythm: Normal rate. Rhythm irregular.     Heart sounds: Murmur (Faint systolic right upper sternal border) present.  Pulmonary:     Effort: Pulmonary effort is normal. No respiratory distress.     Breath sounds: No wheezing or rales.  Abdominal:     General: There is no distension.     Palpations: Abdomen is soft.     Tenderness: There is no abdominal tenderness.  Musculoskeletal:        General: No swelling.  Lymphadenopathy:     Cervical: No cervical adenopathy.  Skin:    General: Skin is  warm and dry.  Neurological:     General: No focal deficit present.     Mental Status: He is alert and oriented to person, place, and time.     Cranial Nerves: No cranial nerve deficit.     Motor: No weakness.    Diagnostic Tests: CT CHEST WITHOUT CONTRAST  TECHNIQUE: Multidetector CT imaging of the chest was performed following the standard protocol without IV contrast.  COMPARISON:  No priors.  FINDINGS:  Cardiovascular: Heart size is mildly enlarged with biatrial dilatation. Trace amount of pericardial fluid and/or thickening, unlikely to be of any hemodynamic significance at this time. No associated pericardial calcification. There is aortic atherosclerosis, as well as atherosclerosis of the great vessels of the mediastinum and the coronary arteries, including calcified atherosclerotic plaque in the left main, left anterior descending, left circumflex and right coronary arteries. Calcifications of the aortic valve. Mild aneurysmal dilatation of the ascending thoracic aorta (4.5 cm in diameter).  Mediastinum/Nodes: No pathologically enlarged mediastinal or hilar lymph nodes. Moderate-sized hiatal hernia. No axillary lymphadenopathy.  Lungs/Pleura: No pneumothorax. No acute consolidative airspace disease. No pleural effusions. Scattered areas of mild septal thickening most evident throughout the mid to lower lungs bilaterally, particularly in the periphery, potentially indicative of very early fibrosis.  Upper Abdomen: Multiple low-attenuation lesions throughout the visualized liver, spleen and kidneys, incompletely characterized on today's noncontrast CT examination, but statistically likely to represent cysts. The largest of these lesions is in the upper pole of the right kidney measuring 9.7 cm in diameter with peripheral calcifications, potentially a complex cyst, in need of further characterization.  Musculoskeletal: Nondisplaced fractures of the  anterolateral left third and fourth ribs. Multiple old healed bilateral rib fractures. There are no aggressive appearing lytic or blastic lesions noted in the visualized portions of the skeleton.  IMPRESSION: 1. Nondisplaced fractures of the anterolateral aspect of the left third and fourth ribs. No pneumothorax. 2. Cardiomegaly with biatrial dilatation. 3. Moderate hiatal hernia. 4. Aortic atherosclerosis, in addition to left main and 3 vessel coronary artery disease. 5. There are calcifications of the aortic valve. Echocardiographic correlation for evaluation of potential valvular dysfunction may be warranted if clinically indicated. 6. 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 7. 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 Nonemergent outpatient referral to Pulmonology for further evaluation is suggested.  Aortic Atherosclerosis (ICD10-I70.0).   Electronically Signed   By: Vinnie Langton M.D.   On: 12/19/2018 14:14 I personally reviewed the CT images and concur with the findings noted above  Impression: Nathan [name] is an 78 year old man with a past medical history significant for hypertension, permanent atrial fibrillation, hiatal hernia, degenerative joint disease, and chronic renal impairment.  He recently fell suffering fractures of the third and fourth ribs on the left side.  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  He also was noted to have coronary artery calcifications.  Ascending aneurysm/thoracic aortic atherosclerosis-4.5 cm aneurysm.  There is no indication for surgical intervention.  Surgery would be considered if the aneurysm got into the 5.5-6 range.  I am not sure he would be a candidate, but we could address at that time if needed.  We will plan to follow with annual CT of the chest.  If there is signs of growth  will need to go to semiannual imaging.  Hypertension-blood pressure slightly elevated today.  He is on Lopressor, lisinopril, and diltiazem.  Managed by his primary physician Dr. Ronnald Ramp.  Permanent atrial fibrillation-followed by Dr. Rayann Heman.  On Coumadin.  Plan: Return in 1 year with CT chest, no contrast  Melrose Nakayama, MD [facility] [phone]

## 2019-01-29 ENCOUNTER — Other Ambulatory Visit: Payer: Self-pay

## 2019-01-29 ENCOUNTER — Ambulatory Visit (INDEPENDENT_AMBULATORY_CARE_PROVIDER_SITE_OTHER): Payer: Medicare Other | Admitting: General Practice

## 2019-01-29 DIAGNOSIS — Z7901 Long term (current) use of anticoagulants: Secondary | ICD-10-CM | POA: Diagnosis not present

## 2019-01-29 DIAGNOSIS — I4891 Unspecified atrial fibrillation: Secondary | ICD-10-CM

## 2019-01-29 LAB — POCT INR: INR: 2.4 (ref 2.0–3.0)

## 2019-01-29 NOTE — Progress Notes (Signed)
Agreed -

## 2019-01-29 NOTE — Patient Instructions (Addendum)
Pre visit review using our clinic review tool, if applicable. No additional management support is needed unless otherwise documented below in the visit note.  Continue to take 1/2 tablet daily except 1 tablet on Mon Wed and Fridays.  Re-check in 4 weeks.  

## 2019-01-30 NOTE — Progress Notes (Signed)
Hello,  I can't sign off on this encounter.  It says there is something you need to complete?  Thanks! Jenny Reichmann

## 2019-02-01 NOTE — Progress Notes (Signed)
Dr. Ronnald Ramp,  Could you please finish this note at your convenience.  Thx! Jenny Reichmann

## 2019-02-11 ENCOUNTER — Other Ambulatory Visit: Payer: Self-pay | Admitting: Internal Medicine

## 2019-02-13 NOTE — Telephone Encounter (Signed)
° ° ° °*  STAT* If patient is at the pharmacy, call can be transferred to refill team.   1. Which medications need to be refilled? (please list name of each medication and dose if known) diltiazem (CARDIZEM CD) 120 MG 24 hr capsule 2. Which pharmacy/location (including street and city if local pharmacy) is medication to be sent to?[facility] 3. Do they need a 30 day or 90 day supply? [place]

## 2019-02-17 NOTE — Progress Notes (Deleted)
Cardiology Office Note Date:  02/17/2019  Patient ID:  Nathan, [name] April 28, 1940, MRN [id number] PCP:  Janith Lima, MD  Electrophysiologist: Dr. Rayann Heman   Chief Complaint: annual EP visit  History of Present Illness: Nathan [name] is a 78 y.o. male with history of CKD (III), HTN, gout, HLD, and AFib.  He comes today to be seen for Dr. Rayann Heman, last seen by him, Oct 2018.  At that time doing well with rate controlled AFib, planned for annual APP visits, no changes were made to his therapy.  I saw himi Nov 2019, he was ambulating with a walker, accompanied by a friend.  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  No ear pain, no bleeding, no ringing in his ears.  Cardiac-wise, he was feeling very good.  Reported he had never been aware of his irregular heart beat.  No CP or SOB, no DOE,.  He mentionedarthritis "head to toe" and this limited his ability to do much physically, but was gettinmg around and doing well. His warfarin was being managed by his PMD office, denied any trouble with control, no bleeding or signs of bleeding.  He denied any falls, no near syncope or syncope.  Reported his PMD does labs, is due to see him soon.  No changes were madeto his therapy, instructed to f/u with his PMD regarding hearing loss.  *** symptoms *** syncope *** bleeding, warfarin *** meds   Past Medical History:  Diagnosis Date  . Cancer ([place])    skin  . Chronic renal impairment   . Diverticulosis   . DJD (degenerative joint disease)   . Elevated PSA    Dr Junious Silk  . H/O hiatal hernia   . Hypertension   . 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 Permanent atrial fibrillation ([place])   . PONV (postoperative nausea and vomiting)    no  problems recently    Past Surgical History:  Procedure  Laterality Date  . COLONOSCOPY  2003  . HERNIA REPAIR    . HIATAL HERNIA REPAIR     Dr Kaylyn Lim , laparoscopic  . INGUINAL HERNIA REPAIR Left 03/05/2013   Procedure: HERNIA REPAIR INGUINAL ADULT;  Surgeon: [name], MD;  Location: [facility];  Service: General;  Laterality: Left;  . INSERTION OF MESH Left 03/05/2013   Procedure: INSERTION OF MESH;  Surgeon: [name], MD;  Location: [facility];  Service: General;  Laterality: Left;  . TOTAL HIP ARTHROPLASTY Right 07  . TOTAL KNEE ARTHROPLASTY Right 07    Current Outpatient Medications  Medication Sig Dispense Refill  . acetaminophen (TYLENOL) 650 MG CR tablet Take 650 mg by mouth every 8 (eight) hours as needed for pain.    Marland Kitchen allopurinol (ZYLOPRIM) 100 MG tablet Take 1 tablet (100 mg total) by mouth daily. 90 tablet 1  . Dermatological Products, Misc. [facility]) lotion Apply 1 Act topically 2 (two) times daily with a meal. (Patient not taking: Reported on 01/22/2019) 100 g 11  . diltiazem (CARDIZEM CD) 120 MG 24 hr capsule Take 1 capsule (120 mg total) by mouth daily. Please keep upcoming appt in November before anymore refills. Thank you 90 capsule 0  . finasteride (PROSCAR) 5 MG tablet Take 5 mg by mouth daily. Rx'ed by [name]    .  furosemide (LASIX) 40 MG tablet TAKE 1 TABLET BY MOUTH ONCE DAILY 90 tablet 3  . HYDROcodone-acetaminophen (NORCO/VICODIN) 5-325 MG tablet Take 1 tablet by mouth every 4 (four) hours as needed for moderate pain. 12 tablet 0  . lisinopril (PRINIVIL,ZESTRIL) 40 MG tablet Take 1 tablet (40 mg total) by mouth daily. 90 tablet 2  . metoprolol tartrate (LOPRESSOR) 100 MG tablet Take 1 tablet (100 mg total) by mouth 2 (two) times daily. Please keep upcoming appt in November for future refills. Thank you 180 tablet 0  . warfarin (COUMADIN) 5 MG tablet TAKE 1/2 TABLET BY MOUTH EVERY DAY EXCEPT TABLET 1 TABLET ON MONDAY, WEDNESDAY AND FRIDAY OR AS DIRECTED BY ANTIOAGULATION CLINIC 30 tablet 3   No current  facility-administered medications for this visit.     Allergies:   Tramadol   Social History:  The patient  reports that he quit smoking about 27 years ago. His smoking use included cigarettes. He has a 0.75 pack-year smoking history. He has never used smokeless tobacco. He reports current alcohol use of about 2.0 standard drinks of alcohol per week. He reports that he does not use drugs.   Family History:  The patient's family history includes Brain cancer in his sister; Diabetes in his brother; Hypertension in his brother and mother; Skin cancer in his mother.  ROS:  Please see the history of present illness. All other systems are reviewed and otherwise negative.   PHYSICAL EXAM:  VS:  There were no vitals taken for this visit. BMI: There is no height or weight on file to calculate BMI. Well nourished, well developed, in no acute distress  HEENT: normocephalic, atraumatic  Neck: no JVD, carotid bruits or masses Cardiac:  *** Irreg-irreg; no significant murmurs, no rubs, or gallops Lungs:  *** CTA b/l, no wheezing, rhonchi or rales  Abd: soft, nontender MS: no deformity, *** age appropriate atrophy Ext: *** trace edema  Skin: warm and dry, no rash Neuro:  No gross deficits appreciated Psych: euthymic mood, full affect    EKG:  Done today and reviewed by myself ***  06/03/15: TTE Study Conclusions - Left ventricle: The cavity size was normal. Wall thickness was   increased in a pattern of mild LVH. There was focal basal   hypertrophy. Systolic function was normal. The estimated ejection   fraction was in the range of 50% to 55%. - Aortic valve: There was mild regurgitation. - Mitral valve: There was mild to moderate regurgitation. - Left atrium: The atrium was moderately dilated. - Right atrium: The atrium was mildly dilated. - Pulmonary arteries: Systolic pressure was mildly increased. PA   peak pressure: 32 mm Hg (S).   Recent Labs: 06/18/2018: ALT 11; TSH 3.12 12/19/2018:  BUN 32; Creatinine, Ser 1.71; Potassium 3.5; Sodium 142 12/31/2018: Hemoglobin 12.5; Platelets 265.0  06/18/2018: Cholesterol 148; HDL 47.10; LDL Cholesterol 76; Total CHOL/HDL Ratio 3; Triglycerides 127.0; VLDL 25.4   CrCl cannot be calculated (Patient's most recent lab result is older than the maximum 21 days allowed.).   Wt Readings from Last 3 Encounters:  01/22/19 200 lb (90.7 kg)  12/31/18 199 lb 4 oz (90.4 kg)  06/18/18 204 lb (92.5 kg)     Other studies reviewed: Additional studies/records reviewed today include: summarized above  ASSESSMENT AND PLAN:  1. Permanent AFib     CHA2DS2Vasc is 3, on Warfarin, *** monitored and managed with ***     *** Asymptomatic, rate controlled  2. HTN     ***  Looks OK, no changes  3. Diminished hearing after the shooting range     *** He is instructed to f/u/see his PMD   Disposition: ***  Current medicines are reviewed at length with the patient today.  The patient did not have any concerns regarding medicines.  Venetia Night, PA-C 02/17/2019 9:06 AM     [facility] [address] [place] [address] [phone] (office)  [phone] (fax)

## 2019-02-19 ENCOUNTER — Ambulatory Visit: Payer: Medicare Other | Admitting: Physician Assistant

## 2019-02-22 ENCOUNTER — Other Ambulatory Visit: Payer: Self-pay | Admitting: Internal Medicine

## 2019-02-26 ENCOUNTER — Ambulatory Visit (INDEPENDENT_AMBULATORY_CARE_PROVIDER_SITE_OTHER): Payer: Medicare Other | Admitting: Internal Medicine

## 2019-02-26 ENCOUNTER — Other Ambulatory Visit: Payer: Self-pay

## 2019-02-26 DIAGNOSIS — I4891 Unspecified atrial fibrillation: Secondary | ICD-10-CM

## 2019-02-26 DIAGNOSIS — Z7901 Long term (current) use of anticoagulants: Secondary | ICD-10-CM | POA: Diagnosis not present

## 2019-02-26 LAB — POCT INR: INR: 2.6 (ref 2.0–3.0)

## 2019-02-26 NOTE — Patient Instructions (Addendum)
Pre visit review using our clinic review tool, if applicable. No additional management support is needed unless otherwise documented below in the visit note.  Continue to take 1/2 tablet daily except 1 tablet on Mon Wed and Fridays.  Re-check in 4 weeks.  

## 2019-02-26 NOTE — Progress Notes (Signed)
I have reviewed and agree.

## 2019-03-06 NOTE — Progress Notes (Signed)
Cardiology Office Note Date:  03/06/2019  Patient ID:  Nathan, [name] December 13, 1940, MRN [id number] PCP:  Janith Lima, MD  Electrophysiologist: Dr. Rayann Heman   Chief Complaint:  annual EP visit  History of Present Illness: Nathan [name] is a 78 y.o. male with history of CKD (III), HTN, gout, HLD, and AFib.  He comes today to be seen for Dr. Rayann Heman, last seen by him, Oct 2018.  At that time doing well with rate controlled AFib, planned for annual APP visits, no changes were made to his therapy.  I saw him Nov 2019, he was ambulating with a walker, accompanied by a friend.  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  No ear pain, no bleeding, no ringing in his ears.  Cardiac-wise, he was feeling very good.  Reported he had never been aware of his irregular heart beat.  No CP or SOB, no DOE,.  He mentioned arthritis "head to toe" and this limited his ability to do much physically, but was gettinmg around and doing well. His warfarin was being managed by his PMD office, denied any trouble with control, no bleeding or signs of bleeding.  He denied any falls, no near syncope or syncope.  Reported his PMD does labs, is due to see him soon.  No changes were madeto his therapy, instructed to f/u with his PMD regarding hearing loss.  Aug 2020 ER visit with chest wall pain after fall, + fx ribs, no other injuries by CT.  Incidental findings of possible pulmonary fibrosis and aortic aneurysm. IMPRESSION: 1. Nondisplaced fractures of the anterolateral aspect of the left third and fourth ribs. No pneumothorax. 2. Cardiomegaly with biatrial dilatation. 3. Moderate hiatal hernia. 4. Aortic atherosclerosis, in addition to left main and 3 vessel coronary artery disease. 5. There are calcifications of the aortic valve. Echocardiographic correlation for evaluation of  potential valvular dysfunction may be warranted if clinically indicated. 6. 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 7. 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 Nonemergent outpatient referral to Pulmonology for further evaluation is suggested.  He ias accompanied by his significant other, hard of hearing.  He is doing OK, has multiple orthopedic issues that limit him significantly.  Walks with a walker, but able to do his ADLs.  He reports terrible bak,knee pain and an old L ankle injury as well.  He does not do any formal exercise, but denies any CP, SOB with his level of exertion.  No DOE< no dizzy spells, near syncope or syncope.  He is unaware of his AFib. His fall in August was a loss of balance.  He has already seen his PMD who reviewed hi CT with him, referred to Dr. Roxan Hockey regarding the aneurysm, planned to recheck in a year.    His warfarin is managed by his PMD, he denies any bleeding or signs of bleeding.  He mentions he previously declined alternative to warfarin, feels like it has served him well.  Past Medical History:  Diagnosis Date  . Cancer ([place])    skin  . Chronic renal impairment   . Diverticulosis   . DJD (degenerative joint disease)   . Elevated PSA    Dr Junious Silk  . H/O hiatal hernia   . Hypertension   . 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 Permanent atrial fibrillation ([place])   . PONV (postoperative nausea and vomiting)    no  problems recently    Past Surgical History:  Procedure Laterality Date  . COLONOSCOPY  2003  . HERNIA REPAIR    . HIATAL HERNIA REPAIR     Dr Kaylyn Lim , laparoscopic  . INGUINAL HERNIA REPAIR Left 03/05/2013   Procedure: HERNIA REPAIR INGUINAL ADULT;  Surgeon: [name], MD;  Location: [facility];  Service: General;  Laterality: Left;  . INSERTION OF MESH Left 03/05/2013   Procedure: INSERTION OF MESH;   Surgeon: [name], MD;  Location: [facility];  Service: General;  Laterality: Left;  . TOTAL HIP ARTHROPLASTY Right 07  . TOTAL KNEE ARTHROPLASTY Right 07    Current Outpatient Medications  Medication Sig Dispense Refill  . acetaminophen (TYLENOL) 650 MG CR tablet Take 650 mg by mouth every 8 (eight) hours as needed for pain.    Marland Kitchen allopurinol (ZYLOPRIM) 100 MG tablet Take 1 tablet (100 mg total) by mouth daily. 90 tablet 1  . Dermatological Products, Misc. [facility]) lotion Apply 1 Act topically 2 (two) times daily with a meal. (Patient not taking: Reported on 01/22/2019) 100 g 11  . diltiazem (CARDIZEM CD) 120 MG 24 hr capsule Take 1 capsule (120 mg total) by mouth daily. Please keep upcoming appt in November before anymore refills. Thank you 90 capsule 0  . finasteride (PROSCAR) 5 MG tablet Take 5 mg by mouth daily. Rx'ed by [name]    . furosemide (LASIX) 40 MG tablet Take 1 tablet (40 mg total) by mouth daily. Pt needs to keep upcoming appt in Nov for further refills 30 tablet 0  . HYDROcodone-acetaminophen (NORCO/VICODIN) 5-325 MG tablet Take 1 tablet by mouth every 4 (four) hours as needed for moderate pain. 12 tablet 0  . lisinopril (PRINIVIL,ZESTRIL) 40 MG tablet Take 1 tablet (40 mg total) by mouth daily. 90 tablet 2  . metoprolol tartrate (LOPRESSOR) 100 MG tablet Take 1 tablet (100 mg total) by mouth 2 (two) times daily. Please keep upcoming appt in November for future refills. Thank you 180 tablet 0  . warfarin (COUMADIN) 5 MG tablet TAKE 1/2 TABLET BY MOUTH EVERY DAY EXCEPT TABLET 1 TABLET ON MONDAY, WEDNESDAY AND FRIDAY OR AS DIRECTED BY ANTIOAGULATION CLINIC 30 tablet 3   No current facility-administered medications for this visit.     Allergies:   Tramadol   Social History:  The patient  reports that he quit smoking about 27 years ago. His smoking use included cigarettes. He has a 0.75 pack-year smoking history. He has never used smokeless tobacco. He reports current  alcohol use of about 2.0 standard drinks of alcohol per week. He reports that he does not use drugs.   Family History:  The patient's family history includes Brain cancer in his sister; Diabetes in his brother; Hypertension in his brother and mother; Skin cancer in his mother.  ROS:  Please see the history of present illness. All other systems are reviewed and otherwise negative.   PHYSICAL EXAM:  VS:  There were no vitals taken for this visit. BMI: There is no height or weight on file to calculate BMI. Well nourished, well developed, in no acute distress  HEENT: normocephalic, atraumatic  Neck: no JVD, carotid bruits or masses Cardiac:  Irreg-irreg; no significant murmurs, no rubs, or gallops Lungs:  CTA b/l, no wheezing, rhonchi or rales  Abd: soft, nontender  MS: no deformity, age appropriate atrophy Ext: trace edema L>R, he reports chronic since his ankle injury years ago  Skin: warm and dry, no rash Neuro:  No gross deficits appreciated Psych: euthymic mood, full affect    EKG:  Done today and reviewed by myself AFib 79 bpm, no ST/T changes  06/03/15: TTE Study Conclusions - Left ventricle: The cavity size was normal. Wall thickness was   increased in a pattern of mild LVH. There was focal basal   hypertrophy. Systolic function was normal. The estimated ejection   fraction was in the range of 50% to 55%. - Aortic valve: There was mild regurgitation. - Mitral valve: There was mild to moderate regurgitation. - Left atrium: The atrium was moderately dilated. - Right atrium: The atrium was mildly dilated. - Pulmonary arteries: Systolic pressure was mildly increased. PA   peak pressure: 32 mm Hg (S).   Recent Labs: 06/18/2018: ALT 11; TSH 3.12 12/19/2018: BUN 32; Creatinine, Ser 1.71; Potassium 3.5; Sodium 142 12/31/2018: Hemoglobin 12.5; Platelets 265.0  06/18/2018: Cholesterol 148; HDL 47.10; LDL Cholesterol 76; Total CHOL/HDL Ratio 3; Triglycerides 127.0; VLDL 25.4   CrCl  cannot be calculated (Patient's most recent lab result is older than the maximum 21 days allowed.).   Wt Readings from Last 3 Encounters:  01/22/19 200 lb (90.7 kg)  12/31/18 199 lb 4 oz (90.4 kg)  06/18/18 204 lb (92.5 kg)     Other studies reviewed: Additional studies/records reviewed today include: summarized above  ASSESSMENT AND PLAN:  1. Permanent AFib     CHA2DS2Vasc is 3, on Warfarin,  monitored and managed with his PMD     Asymptomatic, rate controlled  2. HTN     Looks OK, no changes  3. Coronary atherosclerosis on his CT     No symptoms     Discussed with the patien and his significant other, w/u for CAD, stress testing recommended and cath if needed     The pt tells me he does not think he would be inclined to want a cath, he denies any CP SOb, DOE.  They both prefer to hold off on work up, should he develop any kind of symptoms (discussed with them) they will reach out.    Disposition: We will continue annual visits, sooner if needed.    Current medicines are reviewed at length with the patient today.  The patient did not have any concerns regarding medicines.  Venetia Night, PA-C 03/06/2019 6:31 AM     [place] [address] [phone] (office)  [phone] (fax)

## 2019-03-07 ENCOUNTER — Ambulatory Visit: Payer: Medicare Other | Admitting: Physician Assistant

## 2019-03-07 ENCOUNTER — Other Ambulatory Visit: Payer: Self-pay

## 2019-03-07 VITALS — BP 124/82 | HR 79 | Ht 73.0 in | Wt 204.0 lb

## 2019-03-07 DIAGNOSIS — I4821 Permanent atrial fibrillation: Secondary | ICD-10-CM | POA: Diagnosis not present

## 2019-03-07 DIAGNOSIS — I10 Essential (primary) hypertension: Secondary | ICD-10-CM

## 2019-03-07 DIAGNOSIS — I2510 Atherosclerotic heart disease of native coronary artery without angina pectoris: Secondary | ICD-10-CM | POA: Diagnosis not present

## 2019-03-07 NOTE — Patient Instructions (Signed)
Medication Instructions:  Your physician recommends that you continue on your current medications as directed. Please refer to the Current Medication list given to you today.  *If you need a refill on your cardiac medications before your next appointment, please call your pharmacy*  Lab Work: NONE ORDERED  TODAY \  If you have labs (blood work) drawn today and your tests are completely normal, you will receive your results only by: . MyChart Message (if you have MyChart) OR . A paper copy in the mail If you have any lab test that is abnormal or we need to change your treatment, we will call you to review the results.  Testing/Procedures:  NONE ORDERED  TODAY  Follow-Up: At [facility], you and your health needs are our priority.  As part of our continuing mission to provide you with exceptional heart care, we have created designated Provider Care Teams.  These Care Teams include your primary Cardiologist (physician) and Advanced Practice Providers (APPs -  Physician Assistants and Nurse Practitioners) who all work together to provide you with the care you need, when you need it.  Your next appointment:   1 year(s)  The format for your next appointment:   In Person  Provider:   You may see  Dr. Allred or one of the following Advanced Practice Providers on your designated Care Team:    Amber Seiler, NP  Renee Ursuy, PA-C  Michael "Andy" Tillery, PA-C   Other Instructions  

## 2019-03-18 ENCOUNTER — Ambulatory Visit: Payer: Medicare Other | Admitting: Pulmonary Disease

## 2019-03-18 ENCOUNTER — Encounter: Payer: Self-pay | Admitting: Pulmonary Disease

## 2019-03-18 ENCOUNTER — Other Ambulatory Visit: Payer: Self-pay

## 2019-03-18 VITALS — BP 132/88 | HR 76 | Temp 97.1°F | Ht 73.0 in | Wt 201.6 lb

## 2019-03-18 DIAGNOSIS — J8410 Pulmonary fibrosis, unspecified: Secondary | ICD-10-CM

## 2019-03-18 NOTE — Progress Notes (Signed)
[facility]  Chief Complaint  Patient presents with  . Consult    Constitutional:  BP 132/88 (BP Location: Left Arm, Patient Position: Sitting, Cuff Size: Normal)   Pulse 76   Temp (!) 97.1 F (36.2 C)   Ht 6\' 1"  (1.854 m)   Wt 201 lb 9.6 oz (91.4 kg)   SpO2 97% Comment: on room air  BMI 26.60 kg/m   Past Medical History:  A fib, HTN, Gout, HLD, CKD 3, HH, Diverticulosis, DJD  Brief Summary:  Nathan [name] is a 78 y.o. male former smoker for assessment of pulmonary fibrosis.  He had a CT chest after falling in September 2020.  Noted to have changes suggestive of early pulmonary fibrosis.  Advised to have further pulmonary assessment.  He doesn't feel like he has any issues with his breathing.  Not having cough, wheeze, sputum, chest congestion, fever, or hemoptysis.  No history of connective tissue disease, asthma, emphysema, pneumonia, TB, or thromboembolic disease.  Worked as a Engineer, agricultural, but retired years ago.  No history of asbestos exposure.  He is from [place].  Smoked cigarettes sporadically, and quit about 30 years ago.  His son has a Programmer, systems; no other animal exposures.  Followed by cardiology for atrial fibrillation.  Doesn't recall ever being on amiodarone.   Physical Exam:   Appearance - well kempt   ENMT - clear nasal mucosa, midline nasal  septum, no oral exudates, no LAN, trachea midline  Respiratory - normal chest wall, normal respiratory effort, no accessory muscle use, no wheeze/rales  CV - s1s2 regular rate and rhythm, no murmurs, no peripheral edema, radial pulses symmetric  GI - soft, non tender, no masses  Lymph - no adenopathy noted in neck and axillary areas  MSK - sitting in wheelchair  Ext - no cyanosis, clubbing, or joint inflammation noted  Skin - no rashes, lesions, or ulcers  Neuro - normal strength, oriented x 3  Psych - normal mood and affect   Assessment/Plan:    Pulmonary fibrosis. - very mild changes on imaging studies - doesn't have significant symptoms - decision made to monitor clinically and only pursue additional testing if symptoms progress, or other findings develop  Permanent A fib. - followed by Dr. Rayann Heman with cardiology  Ascending thoracic aortic aneurysm. - followed by Dr. Modesto Charon with [facility]   Patient Instructions  Follow up in 6 months    Chesley Mires, MD [place] Pager: [phone] 03/18/2019, 2:29 PM  Flow Sheet     Pulmonary tests:    Chest imaging:  CT chest 12/19/18 >> 4.5 cm ascending thoracic aorta, scattered areas of mild septal thickening, mod HH  Cardiac tests:  Echo 06/03/15 >> mild LVH, EF 50 to 55%, mild AR, mod MR, PAS 32 mmHg  Review of Systems:  Constitutional: Negative.   HENT: Positive for hearing loss.   Eyes: Negative.   Respiratory: Negative.   Cardiovascular: Negative.   Gastrointestinal: Negative.   Endocrine: Positive for cold intolerance.  Genitourinary: Positive for frequency and urgency.  Musculoskeletal: Positive for back pain and gait problem.  Skin: Positive for rash.  Allergic/Immunologic: Negative.   Hematological: Bruises/bleeds easily.  Psychiatric/Behavioral: Negative.    Medications:   Allergies as of 03/18/2019      Reactions   Tramadol Nausea And Vomiting      Medication List       Accurate as of March 18, 2019  2:29 PM. If  you have any questions, ask your nurse or doctor.        acetaminophen 650 MG CR tablet Commonly known as: TYLENOL Take 650 mg by mouth every 8 (eight) hours as needed for pain.   diltiazem 120 MG 24 hr capsule Commonly known as: CARDIZEM CD Take 1 capsule (120 mg total) by mouth daily. Please keep upcoming appt in November before anymore refills. Thank you   finasteride 5 MG tablet Commonly known as: PROSCAR Take 5 mg by mouth daily. Rx'ed by [name]   furosemide 40 MG tablet Commonly  known as: LASIX Take 1 tablet (40 mg total) by mouth daily. Pt needs to keep upcoming appt in Nov for further refills   lisinopril 40 MG tablet Commonly known as: ZESTRIL Take 1 tablet (40 mg total) by mouth daily.   metoprolol tartrate 100 MG tablet Commonly known as: LOPRESSOR Take 1 tablet (100 mg total) by mouth 2 (two) times daily. Please keep upcoming appt in November for future refills. Thank you   warfarin 5 MG tablet Commonly known as: COUMADIN Take as directed by the anticoagulation clinic. If you are unsure how to take this medication, talk to your nurse or doctor. Original instructions: TAKE 1/2 TABLET BY MOUTH EVERY DAY EXCEPT TABLET 1 TABLET ON MONDAY, [place] OR AS DIRECTED BY [facility]       Past Surgical History:  He  has a past surgical history that includes Total knee arthroplasty (Right, 07); Total hip arthroplasty (Right, 07); Colonoscopy (2003); Hiatal hernia repair; Hernia repair; Inguinal hernia repair (Left, 03/05/2013); and Insertion of mesh (Left, 03/05/2013).  Family History:  His family history includes Brain cancer in his sister; Diabetes in his brother; Hypertension in his brother and mother; Skin cancer in his mother.  Social History:  He  reports that he quit smoking about 27 years ago. His smoking use included cigarettes. He has a 0.75 pack-year smoking history. He has never used smokeless tobacco. He reports current alcohol use of about 2.0 standard drinks of alcohol per week. He reports that he does not use drugs.

## 2019-03-18 NOTE — Patient Instructions (Signed)
Follow up in 6 months 

## 2019-03-18 NOTE — Progress Notes (Signed)
  Subjective:     Patient ID: Nathan [name], male   DOB: 1940-10-18, 78 y.o.   MRN: [id number]  HPI   Review of Systems  Constitutional: Negative.   HENT: Positive for hearing loss.   Eyes: Negative.   Respiratory: Negative.   Cardiovascular: Negative.   Gastrointestinal: Negative.   Endocrine: Positive for cold intolerance.  Genitourinary: Positive for frequency and urgency.  Musculoskeletal: Positive for back pain and gait problem.  Skin: Positive for rash.  Allergic/Immunologic: Negative.   Hematological: Bruises/bleeds easily.  Psychiatric/Behavioral: Negative.        Objective:   Physical Exam     Assessment:         Plan:

## 2019-03-19 ENCOUNTER — Other Ambulatory Visit: Payer: Self-pay | Admitting: Internal Medicine

## 2019-03-19 DIAGNOSIS — Z7901 Long term (current) use of anticoagulants: Secondary | ICD-10-CM

## 2019-03-19 MED ORDER — WARFARIN SODIUM 5 MG PO TABS: ORAL_TABLET | ORAL | 3 refills | Status: DC

## 2019-03-25 ENCOUNTER — Other Ambulatory Visit: Payer: Self-pay | Admitting: Internal Medicine

## 2019-03-25 MED ORDER — FUROSEMIDE 40 MG PO TABS: 40.0000 mg | ORAL_TABLET | Freq: Every day | ORAL | 3 refills | Status: DC

## 2019-03-26 ENCOUNTER — Ambulatory Visit (INDEPENDENT_AMBULATORY_CARE_PROVIDER_SITE_OTHER): Payer: Medicare Other | Admitting: General Practice

## 2019-03-26 ENCOUNTER — Other Ambulatory Visit: Payer: Self-pay

## 2019-03-26 DIAGNOSIS — Z7901 Long term (current) use of anticoagulants: Secondary | ICD-10-CM | POA: Diagnosis not present

## 2019-03-26 LAB — POCT INR: INR: 2.3 (ref 2.0–3.0)

## 2019-03-26 MED ORDER — LISINOPRIL 40 MG PO TABS: 40.0000 mg | ORAL_TABLET | Freq: Every day | ORAL | 3 refills | Status: DC

## 2019-03-26 NOTE — Patient Instructions (Addendum)
Pre visit review using our clinic review tool, if applicable. No additional management support is needed unless otherwise documented below in the visit note.  Continue to take 1/2 tablet daily except 1 tablet on Mon Wed and Fridays.  Re-check in 6 weeks.  

## 2019-04-24 NOTE — Progress Notes (Signed)
[facility] Note  05/06/2019     CHIEF COMPLAINT Patient presents for Retina Follow Up   HISTORY OF PRESENT ILLNESS: Nathan [name] is a 79 y.o. male who presents to the clinic today for:   HPI    Retina Follow Up    Patient presents with  Other.  In right eye.  This started 6 months ago.  Severity is moderate.  I, the attending physician,  performed the HPI with the patient and updated documentation appropriately.          Comments    Patient here for 6 months retina follow up for ERM with lamellar hole OD. Patient states vision doing fine. About the same. No eye pain.       Last edited by Bernarda Caffey, MD on 05/06/2019  1:33 PM. (History)    pt states he feels like his vision has not changed, pt is using AT's BID OU  Referring physician: Janith Lima, MD [place],  [place] [address]  HISTORICAL INFORMATION:   Selected notes from the MEDICAL RECORD NUMBER Referred by Dr. Parke Simmers for concern of pseudohole OD and ERM OU LEE- 05.17.19 (K. Hecker) [BCVA OD: 20/30-2 OS: 20/25] Ocular Hx-  Pseudophakia OU, HTN retinopathy OU, non-exudative ARMD OU,  PMH- HTN, permanent A-Fib, chronic renal impairment, former smoker    CURRENT MEDICATIONS: No current outpatient medications on file. (Ophthalmic Drugs)   No current facility-administered medications for this visit. (Ophthalmic Drugs)   Current Outpatient Medications (Other)  Medication Sig  . acetaminophen (TYLENOL) 650 MG CR tablet Take 650 mg by mouth every 8 (eight) hours as needed for pain.  Marland Kitchen diltiazem (CARDIZEM CD) 120 MG 24 hr capsule TAKE 1 CAPSULE BY MOUTH EVERY DAY  . finasteride (PROSCAR) 5 MG tablet Take 5 mg by mouth daily. Rx'ed by [name]  . furosemide (LASIX) 40 MG tablet Take 1 tablet (40 mg total) by mouth daily.  Marland Kitchen lisinopril (ZESTRIL) 40 MG tablet Take 1 tablet (40 mg total) by mouth daily.  . metoprolol tartrate (LOPRESSOR) 100 MG tablet TAKE 1 TABLET  BY MOUTH TWICE DAILY  . warfarin (COUMADIN) 5 MG tablet TAKE 1/2 TABLET BY MOUTH EVERY DAY EXCEPT TABLET 1 TABLET ON MONDAY, WEDNESDAY AND FRIDAY OR AS DIRECTED BY ANTIOAGULATION CLINIC   No current facility-administered medications for this visit. (Other)      REVIEW OF SYSTEMS: ROS    Positive for: Musculoskeletal, Endocrine, Cardiovascular, Eyes, Heme/Lymph   Negative for: Constitutional, Gastrointestinal, Neurological, Skin, Genitourinary, HENT, Respiratory, Psychiatric, Allergic/Imm   Last edited by Theodore Demark, COA on 05/06/2019  1:22 PM. (History)       ALLERGIES Allergies  Allergen Reactions  . Tramadol Nausea And Vomiting    PAST MEDICAL HISTORY Past Medical History:  Diagnosis Date  . Cancer ([place])    skin  . Chronic renal impairment   . Diverticulosis   . DJD (degenerative joint disease)   . Elevated PSA    Dr Junious Silk  . H/O hiatal hernia   . Hypertension   . 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 Permanent atrial fibrillation ([place])   . PONV (postoperative nausea and vomiting)    no  problems recently   Past Surgical History:  Procedure Laterality Date  . COLONOSCOPY  2003  . HERNIA REPAIR    . HIATAL HERNIA REPAIR     Dr Kaylyn Lim ,  laparoscopic  . INGUINAL HERNIA REPAIR Left 03/05/2013   Procedure: HERNIA REPAIR INGUINAL ADULT;  Surgeon: [name], MD;  Location: [facility];  Service: General;  Laterality: Left;  . INSERTION OF MESH Left 03/05/2013   Procedure: INSERTION OF MESH;  Surgeon: [name], MD;  Location: [facility];  Service: General;  Laterality: Left;  . TOTAL HIP ARTHROPLASTY Right 07  . TOTAL KNEE ARTHROPLASTY Right 07    FAMILY HISTORY Family History  Problem Relation Age of Onset  . Hypertension Mother   . Skin cancer Mother   . Brain cancer Sister   . Hypertension Brother   . Diabetes Brother   . Stroke Neg Hx   . Heart disease Neg Hx     SOCIAL  HISTORY Social History   Tobacco Use  . Smoking status: Former Smoker    Packs/day: 0.25    Years: 3.00    Pack years: 0.75    Types: Cigarettes    Quit date: 04/17/1991    Years since quitting: 28.0  . Smokeless tobacco: Never Used  Substance Use Topics  . Alcohol use: Yes    Alcohol/week: 2.0 standard drinks    Types: 2 Glasses of wine per week    Comment: occasionally  . Drug use: No         OPHTHALMIC EXAM:  Base Eye Exam    Visual Acuity (Snellen - Linear)      Right Left   Dist cc 20/30 -2 20/25 -2   Dist ph cc NI 20/20 -1   Correction: Glasses       Tonometry (Tonopen, 1:19 PM)      Right Left   Pressure 15 12       Pupils      Dark Light Shape React APD   Right 3 2 Round Brisk None   Left 3 2 Round Brisk None       Visual Fields (Counting fingers)      Left Right    Full Full       Extraocular Movement      Right Left    Full, Ortho Full, Ortho       Neuro/Psych    Oriented x3: Yes   Mood/Affect: Normal       Dilation    Both eyes: 1.0% Mydriacyl, 2.5% Phenylephrine @ 1:17 PM        Slit Lamp and Fundus Exam    Slit Lamp Exam      Right Left   Lids/Lashes Dermatochalasis - upper lid, Meibomian gland dysfunction Dermatochalasis - upper lid, Meibomian gland dysfunction, Telangiectasia   Conjunctiva/Sclera Temporal and nasal Pinguecula Nasal pterygium, Temporal Pinguecula   Cornea 2+ inferior Punctate epithelial erosions Nasal pterygium comes on 91mm at 0900, iron line at head of pteryigia, 2-3+ diffuse Punctate epithelial erosions, Arcus   Anterior Chamber Deep and quiet Deep and quiet   Iris Round and dilated Round and dilated   Lens Three piece Posterior chamber intraocular lens in good position, open PC Three piece Posterior chamber intraocular lens in good position   Vitreous Vitreous syneresis, Posterior vitreous detachment Vitreous syneresis, Posterior vitreous detachment, vitreous condensations       Fundus Exam      Right Left    Disc mild Pallor, Sharp rim, temporal PPA Mild pallor, mild tilt, temporal PPA, Sharp rim   C/D Ratio 0.3 0.4   Macula Grossly flat, Blunted foveal reflex, lamellar hole, Epiretinal membrane, RPE mottling and clumping, Drusen, No  heme or edema Flat, Blunted foveal reflex, mild Retinal pigment epithelial mottling, Epiretinal membrane, No heme or edema   Vessels Vascular attenuation, mild Tortuousity Vascular attenuation, Tortuous, AV crossing changes   Periphery Attached, laser scars at 0430, operculated hole at 0500 with good surrounding laser, laser scars at 0400, laser scars at 0130, Reticular degeneration Attached, operculated hole at 0600 - no SRF -- excellent laser changes surrounding, Reticular degeneration        Refraction    Wearing Rx      Sphere Cylinder Axis Add   Right -1.50 +1.50 003 +2.75   Left -1.50 +0.75 085 +2.75          IMAGING AND PROCEDURES  Imaging and Procedures for @TODAY @  OCT, Retina - OU - Both Eyes       Right Eye Quality was good. Central Foveal Thickness: 223. Progression has been stable. Findings include abnormal foveal contour, intraretinal fluid, no SRF, epiretinal membrane, retinal drusen , lamellar hole, outer retinal atrophy.   Left Eye Quality was good. Central Foveal Thickness: 253. Progression has been stable. Findings include normal foveal contour, no IRF, no SRF, epiretinal membrane (?Mild interval progression of ERM).   Notes *Images captured and stored on drive  Diagnosis / Impression:  OD: mild ERM with lamellar hole -- stable from prior OS: NFP, No IRF/SRF, ERM -- stable from prior  Clinical management:  See below  Abbreviations: NFP - Normal foveal profile. CME - cystoid macular edema. PED - pigment epithelial detachment. IRF - intraretinal fluid. SRF - subretinal fluid. EZ - ellipsoid zone. ERM - epiretinal membrane. ORA - outer retinal atrophy. ORT - outer retinal tubulation. SRHM - subretinal hyper-reflective  material                  ASSESSMENT/PLAN:    ICD-10-CM   1. Epiretinal membrane (ERM) of both eyes  H35.373   2. Lamellar macular hole of right eye  H35.341   3. Retinal tear of left eye  H33.312   4. Posterior vitreous detachment of both eyes  H43.813   5. History of retinal tear  Z86.69   6. Retinal edema  H35.81 OCT, Retina - OU - Both Eyes  7. Pseudophakia of both eyes  Z96.1     1,2. ERM OU (OD > OS) w/ lamellar hole OD-  - BCVA 20/30; asymptomatic; denies metamorphopsia; happy with vision OD since receiving new glasses  - stable on repeat OCT  - pt wishes to continue monitoring for now  - F/U 12 months, sooner prn  3. Retinal hole OS-    - lightly pigmented operculated hole at 6 oclock ant to equator  - S/P laser retinopexy OS (06.03.19) -- excellent laser in place  - no new RT/RD  4. PVD / vitreous syneresis OU  - Discussed findings and prognosis  - No other RT or RD on 360 scleral depressed exam  - Reviewed s/s of RT/RD  - Strict return precautions for any such RT/RD signs/symptoms  5. History of retinal holes OD-   - multiple laser scars with operculated hole at 0500 with excellent laser surrounding  - done by Dr. Zigmund Daniel   - monitor  6. No retinal edema on exam or OCT  7. Pseudophakia OU  - s/p CE/IOL  - doing well  - monitor   Ophthalmic Meds Ordered this visit:  No orders of the defined types were placed in this encounter.      Return in about 1 year (  around 05/05/2020) for f/u ERM with lamellar hole OU, DFE, OCT.  There are no Patient Instructions on file for this visit.   Explained the diagnoses, plan, and follow up with the patient and they expressed understanding.  Patient expressed understanding of the importance of proper follow up care.   This document serves as a record of services personally performed by Gardiner Sleeper, MD, PhD. It was created on their behalf by Leeann Must, [place], a certified ophthalmic assistant. The  creation of this record is the provider's dictation and/or activities during the visit.    Electronically signed by: Leeann Must, COA @TODAY @ 5:24 PM  Gardiner Sleeper, M.D., Ph.D. Diseases & Surgery of the Retina and Vitreous [facility]  I have reviewed the above documentation for accuracy and completeness, and I agree with the above. Gardiner Sleeper, M.D., Ph.D. 05/11/19 5:24 PM   Abbreviations: M myopia (nearsighted); A astigmatism; H hyperopia (farsighted); P presbyopia; Mrx spectacle prescription;  CTL contact lenses; OD right eye; OS left eye; OU both eyes  XT exotropia; ET esotropia; PEK punctate epithelial keratitis; PEE punctate epithelial erosions; DES dry eye syndrome; MGD meibomian gland dysfunction; ATs artificial tears; PFAT's preservative free artificial tears; [place] nuclear sclerotic cataract; PSC posterior subcapsular cataract; ERM epi-retinal membrane; PVD posterior vitreous detachment; RD retinal detachment; DM diabetes mellitus; DR diabetic retinopathy; NPDR non-proliferative diabetic retinopathy; PDR proliferative diabetic retinopathy; CSME clinically significant macular edema; DME diabetic macular edema; dbh dot blot hemorrhages; CWS cotton wool spot; POAG primary open angle glaucoma; C/D cup-to-disc ratio; HVF humphrey visual field; GVF goldmann visual field; OCT optical coherence tomography; IOP intraocular pressure; BRVO Branch retinal vein occlusion; CRVO central retinal vein occlusion; CRAO central retinal artery occlusion; BRAO branch retinal artery occlusion; RT retinal tear; SB scleral buckle; PPV pars plana vitrectomy; VH Vitreous hemorrhage; PRP panretinal laser photocoagulation; IVK intravitreal kenalog; VMT vitreomacular traction; MH Macular hole;  NVD neovascularization of the disc; NVE neovascularization elsewhere; AREDS age related eye disease study; ARMD age related macular degeneration; POAG primary open angle glaucoma; EBMD  epithelial/anterior basement membrane dystrophy; ACIOL anterior chamber intraocular lens; IOL intraocular lens; PCIOL posterior chamber intraocular lens; Phaco/IOL phacoemulsification with intraocular lens placement; [place] photorefractive keratectomy; LASIK laser assisted in situ keratomileusis; HTN hypertension; DM diabetes mellitus; COPD chronic obstructive pulmonary disease

## 2019-04-30 ENCOUNTER — Other Ambulatory Visit: Payer: Self-pay | Admitting: Internal Medicine

## 2019-05-06 ENCOUNTER — Ambulatory Visit (INDEPENDENT_AMBULATORY_CARE_PROVIDER_SITE_OTHER): Payer: Medicare Other | Admitting: Ophthalmology

## 2019-05-06 ENCOUNTER — Encounter (INDEPENDENT_AMBULATORY_CARE_PROVIDER_SITE_OTHER): Payer: Self-pay | Admitting: Ophthalmology

## 2019-05-06 DIAGNOSIS — H43813 Vitreous degeneration, bilateral: Secondary | ICD-10-CM | POA: Diagnosis not present

## 2019-05-06 DIAGNOSIS — H35373 Puckering of macula, bilateral: Secondary | ICD-10-CM | POA: Diagnosis not present

## 2019-05-06 DIAGNOSIS — H3581 Retinal edema: Secondary | ICD-10-CM

## 2019-05-06 DIAGNOSIS — Z8669 Personal history of other diseases of the nervous system and sense organs: Secondary | ICD-10-CM

## 2019-05-06 DIAGNOSIS — H33312 Horseshoe tear of retina without detachment, left eye: Secondary | ICD-10-CM

## 2019-05-06 DIAGNOSIS — H35341 Macular cyst, hole, or pseudohole, right eye: Secondary | ICD-10-CM | POA: Diagnosis not present

## 2019-05-06 DIAGNOSIS — Z961 Presence of intraocular lens: Secondary | ICD-10-CM

## 2019-05-08 ENCOUNTER — Encounter (INDEPENDENT_AMBULATORY_CARE_PROVIDER_SITE_OTHER): Payer: Medicare Other | Admitting: Ophthalmology

## 2019-05-10 ENCOUNTER — Other Ambulatory Visit: Payer: Self-pay

## 2019-05-10 ENCOUNTER — Ambulatory Visit (INDEPENDENT_AMBULATORY_CARE_PROVIDER_SITE_OTHER): Payer: Medicare Other | Admitting: General Practice

## 2019-05-10 ENCOUNTER — Other Ambulatory Visit: Payer: Self-pay | Admitting: Internal Medicine

## 2019-05-10 DIAGNOSIS — Z7901 Long term (current) use of anticoagulants: Secondary | ICD-10-CM

## 2019-05-10 LAB — POCT INR: INR: 2.2 (ref 2.0–3.0)

## 2019-05-10 NOTE — Patient Instructions (Addendum)
Pre visit review using our clinic review tool, if applicable. No additional management support is needed unless otherwise documented below in the visit note.  Continue to take 1/2 tablet daily except 1 tablet on Mon Wed and Fridays.  Re-check in 6 weeks.  

## 2019-05-10 NOTE — Progress Notes (Signed)
Medical screening examination/treatment/procedure(s) were performed by non-physician practitioner and as supervising physician I was immediately available for consultation/collaboration. I agree with above. Sharra Cayabyab, MD   

## 2019-06-10 ENCOUNTER — Ambulatory Visit (INDEPENDENT_AMBULATORY_CARE_PROVIDER_SITE_OTHER): Payer: Medicare Other | Admitting: Internal Medicine

## 2019-06-10 ENCOUNTER — Other Ambulatory Visit: Payer: Self-pay

## 2019-06-10 ENCOUNTER — Encounter: Payer: Self-pay | Admitting: Internal Medicine

## 2019-06-10 VITALS — BP 150/90 | HR 83 | Temp 98.4°F | Ht 73.0 in | Wt 199.4 lb

## 2019-06-10 DIAGNOSIS — L2084 Intrinsic (allergic) eczema: Secondary | ICD-10-CM | POA: Diagnosis not present

## 2019-06-10 DIAGNOSIS — I4819 Other persistent atrial fibrillation: Secondary | ICD-10-CM | POA: Diagnosis not present

## 2019-06-10 DIAGNOSIS — R600 Localized edema: Secondary | ICD-10-CM | POA: Diagnosis not present

## 2019-06-10 DIAGNOSIS — I10 Essential (primary) hypertension: Secondary | ICD-10-CM | POA: Diagnosis not present

## 2019-06-10 DIAGNOSIS — N1832 Chronic kidney disease, stage 3b: Secondary | ICD-10-CM

## 2019-06-10 DIAGNOSIS — D539 Nutritional anemia, unspecified: Secondary | ICD-10-CM

## 2019-06-10 DIAGNOSIS — I110 Hypertensive heart disease with heart failure: Secondary | ICD-10-CM | POA: Insufficient documentation

## 2019-06-10 LAB — CBC WITH DIFFERENTIAL/PLATELET
Basophils Absolute: 0 10*3/uL (ref 0.0–0.1)
Basophils Relative: 0.3 % (ref 0.0–3.0)
Eosinophils Absolute: 0.2 10*3/uL (ref 0.0–0.7)
Eosinophils Relative: 2.5 % (ref 0.0–5.0)
HCT: 37.4 % — ABNORMAL LOW (ref 39.0–52.0)
Hemoglobin: 11.9 g/dL — ABNORMAL LOW (ref 13.0–17.0)
Lymphocytes Relative: 8.1 % — ABNORMAL LOW (ref 12.0–46.0)
Lymphs Abs: 0.7 10*3/uL (ref 0.7–4.0)
MCHC: 31.8 g/dL (ref 30.0–36.0)
MCV: 93.9 fl (ref 78.0–100.0)
Monocytes Absolute: 0.6 10*3/uL (ref 0.1–1.0)
Monocytes Relative: 7.7 % (ref 3.0–12.0)
Neutro Abs: 6.7 10*3/uL (ref 1.4–7.7)
Neutrophils Relative %: 81.4 % — ABNORMAL HIGH (ref 43.0–77.0)
Platelets: 262 10*3/uL (ref 150.0–400.0)
RBC: 3.98 Mil/uL — ABNORMAL LOW (ref 4.22–5.81)
RDW: 15.7 % — ABNORMAL HIGH (ref 11.5–15.5)
WBC: 8.3 10*3/uL (ref 4.0–10.5)

## 2019-06-10 LAB — TSH: TSH: 4.96 u[IU]/mL — ABNORMAL HIGH (ref 0.35–4.50)

## 2019-06-10 LAB — BASIC METABOLIC PANEL
BUN: 34 mg/dL — ABNORMAL HIGH (ref 6–23)
CO2: 26 mEq/L (ref 19–32)
Calcium: 9 mg/dL (ref 8.4–10.5)
Chloride: 106 mEq/L (ref 96–112)
Creatinine, Ser: 1.58 mg/dL — ABNORMAL HIGH (ref 0.40–1.50)
GFR: 42.29 mL/min — ABNORMAL LOW (ref >60.00)
Glucose, Bld: 91 mg/dL (ref 70–99)
Potassium: 4 mEq/L (ref 3.5–5.1)
Sodium: 142 mEq/L (ref 135–145)

## 2019-06-10 LAB — TROPONIN I (HIGH SENSITIVITY): High Sens Troponin I: 10 ng/L (ref 2–17)

## 2019-06-10 LAB — BRAIN NATRIURETIC PEPTIDE: Pro B Natriuretic peptide (BNP): 535 pg/mL — ABNORMAL HIGH (ref 0.0–100.0)

## 2019-06-10 MED ORDER — FLUOCINONIDE-E 0.05 % EX CREA: 1.0000 "application " | TOPICAL_CREAM | Freq: Two times a day (BID) | CUTANEOUS | 1 refills | Status: DC

## 2019-06-10 MED ORDER — FUROSEMIDE 40 MG PO TABS: 40.0000 mg | ORAL_TABLET | Freq: Two times a day (BID) | ORAL | 0 refills | Status: DC

## 2019-06-10 NOTE — Progress Notes (Signed)
Subjective:  Patient ID: Nathan [name], male    DOB: 1940-05-06  Age: 79 y.o. MRN: [id number]  CC: Rash  This visit occurred during the SARS-CoV-2 public health emergency.  Safety protocols were in place, including screening questions prior to the visit, additional usage of staff PPE, and extensive cleaning of exam room while observing appropriate contact time as indicated for disinfecting solutions.    HPI [name] presents for f/up -  1.  He complains of a 2-week history of painless edema in his lower extremities.  He tells me he is compliant with the loop diuretic, CCB, and beta-blocker.  He denies weight gain, shortness of breath, chest pain, palpitations, fatigue, or diaphoresis.  He has a history of atrial fibrillation and an echocardiogram done 4 years ago showed LVH.  2.  He complains of a several month history of itchy rash on his back.  He lives alone and has not been able to see the rash.  He has not applied anything to the affected area.  Outpatient Medications Prior to Visit  Medication Sig Dispense Refill  . acetaminophen (TYLENOL) 650 MG CR tablet Take 650 mg by mouth every 8 (eight) hours as needed for pain.    Marland Kitchen diltiazem (CARDIZEM CD) 120 MG 24 hr capsule TAKE 1 CAPSULE BY MOUTH EVERY DAY 90 capsule 2  . finasteride (PROSCAR) 5 MG tablet Take 5 mg by mouth daily. Rx'ed by [name]    . lisinopril (ZESTRIL) 40 MG tablet Take 1 tablet (40 mg total) by mouth daily. 90 tablet 3  . metoprolol tartrate (LOPRESSOR) 100 MG tablet TAKE 1 TABLET BY MOUTH TWICE DAILY 180 tablet 3  . warfarin (COUMADIN) 5 MG tablet TAKE 1/2 TABLET BY MOUTH EVERY DAY EXCEPT TABLET 1 TABLET ON MONDAY, WEDNESDAY AND FRIDAY OR AS DIRECTED BY ANTIOAGULATION CLINIC 30 tablet 3  . furosemide (LASIX) 40 MG tablet Take 1 tablet (40 mg total) by mouth daily. 90 tablet 3   No facility-administered medications prior to visit.    ROS Review of Systems  Constitutional: Negative for chills,  diaphoresis, fatigue, fever and unexpected weight change.  HENT: Negative.   Eyes: Negative.   Respiratory: Negative for cough, chest tightness, shortness of breath and wheezing.   Cardiovascular: Positive for leg swelling. Negative for chest pain and palpitations.  Gastrointestinal: Negative for abdominal pain, constipation, diarrhea, nausea and vomiting.  Endocrine: Negative.   Genitourinary: Negative.  Negative for difficulty urinating.  Musculoskeletal: Negative.  Negative for arthralgias and myalgias.  Skin: Positive for rash. Negative for color change and pallor.  Neurological: Negative.  Negative for dizziness, weakness, light-headedness, numbness and headaches.  Hematological: Negative for adenopathy. Does not bruise/bleed easily.  Psychiatric/Behavioral: Negative.     Objective:  BP (!) 150/90 (BP Location: Left Arm, Patient Position: Sitting, Cuff Size: Normal)   Pulse 83   Temp 98.4 F (36.9 C) (Oral)   Ht 6\' 1"  (1.854 m)   Wt 199 lb 6 oz (90.4 kg)   SpO2 94%   BMI 26.30 kg/m   BP Readings from Last 3 Encounters:  06/10/19 (!) 150/90  03/18/19 132/88  03/07/19 124/82    Wt Readings from Last 3 Encounters:  06/10/19 199 lb 6 oz (90.4 kg)  03/18/19 201 lb 9.6 oz (91.4 kg)  03/07/19 204 lb (92.5 kg)    Physical Exam Vitals reviewed.  Constitutional:      Appearance: He is ill-appearing (wheelchair bound). He is not diaphoretic.  HENT:  Mouth/Throat:     Mouth: Mucous membranes are moist.  Eyes:     General: No scleral icterus.    Conjunctiva/sclera: Conjunctivae normal.  Cardiovascular:     Rate and Rhythm: Tachycardia present. Rhythm irregularly irregular.     Heart sounds: No murmur. No friction rub. Gallop present.      Comments: EKG - A fib with ventricular rate of 105 NS ST/T wave changes Pulmonary:     Effort: Pulmonary effort is normal.     Breath sounds: No stridor. No wheezing, rhonchi or rales.  Abdominal:     General: Abdomen is flat.  Bowel sounds are normal. There is no distension.     Palpations: Abdomen is soft. There is no hepatomegaly or splenomegaly.     Tenderness: There is no abdominal tenderness.  Musculoskeletal:        General: Normal range of motion.     Cervical back: Neck supple.     Right lower leg: 2+ Edema present.     Left lower leg: 2+ Edema present.  Lymphadenopathy:     Cervical: No cervical adenopathy.  Skin:    Findings: Rash present. No abscess, ecchymosis or erythema. Rash is papular. Rash is not crusting, macular, nodular, purpuric, pustular, scaling or vesicular.       Neurological:     General: No focal deficit present.     Mental Status: He is alert and oriented to person, place, and time. Mental status is at baseline.     Lab Results  Component Value Date   WBC 8.3 06/10/2019   HGB 11.9 (L) 06/10/2019   HCT 37.4 (L) 06/10/2019   PLT 262.0 06/10/2019   GLUCOSE 91 06/10/2019   CHOL 148 06/18/2018   TRIG 127.0 06/18/2018   HDL 47.10 06/18/2018   LDLCALC 76 06/18/2018   ALT 11 06/18/2018   AST 12 06/18/2018   NA 142 06/10/2019   K 4.0 06/10/2019   CL 106 06/10/2019   CREATININE 1.58 (H) 06/10/2019   BUN 34 (H) 06/10/2019   CO2 26 06/10/2019   TSH 4.96 (H) 06/10/2019   INR 2.2 05/10/2019   HGBA1C 5.4 01/20/2015    DG Chest 2 View  Result Date: 12/19/2018 CLINICAL DATA:  Left rib pain following a fall down steps 3 days ago. EXAM: CHEST - 2 VIEW COMPARISON:  01/21/2015. FINDINGS: Stable mildly enlarged cardiac silhouette. Small to moderate-sized hiatal hernia. Minimal patchy density at the left lung base. Mildly prominent pulmonary vasculature. Left shoulder degenerative changes with superior migration of the humeral head, compatible with a chronic rotator cuff tear. Thoracic spine degenerative changes. No fracture or pneumothorax seen. IMPRESSION: 1. Minimal patchy atelectasis or pneumonia at the left lung base. 2. Stable mild cardiomegaly and mild pulmonary vascular congestion.  3. Small to moderate-sized hiatal hernia. Electronically Signed   By: Claudie Revering M.D.   On: 12/19/2018 11:05   CT Chest Wo Contrast  Result Date: 12/19/2018 CLINICAL DATA:  79 year old male with history of trauma from a fall complaining of chest wall pain. Possible rib fracture. EXAM: CT CHEST WITHOUT CONTRAST TECHNIQUE: Multidetector CT imaging of the chest was performed following the standard protocol without IV contrast. COMPARISON:  No priors. FINDINGS: Cardiovascular: Heart size is mildly enlarged with biatrial dilatation. Trace amount of pericardial fluid and/or thickening, unlikely to be of any hemodynamic significance at this time. No associated pericardial calcification. There is aortic atherosclerosis, as well as atherosclerosis of the great vessels of the mediastinum and the coronary arteries, including  calcified atherosclerotic plaque in the left main, left anterior descending, left circumflex and right coronary arteries. Calcifications of the aortic valve. Mild aneurysmal dilatation of the ascending thoracic aorta (4.5 cm in diameter). Mediastinum/Nodes: No pathologically enlarged mediastinal or hilar lymph nodes. Moderate-sized hiatal hernia. No axillary lymphadenopathy. Lungs/Pleura: No pneumothorax. No acute consolidative airspace disease. No pleural effusions. Scattered areas of mild septal thickening most evident throughout the mid to lower lungs bilaterally, particularly in the periphery, potentially indicative of very early fibrosis. Upper Abdomen: Multiple low-attenuation lesions throughout the visualized liver, spleen and kidneys, incompletely characterized on today's noncontrast CT examination, but statistically likely to represent cysts. The largest of these lesions is in the upper pole of the right kidney measuring 9.7 cm in diameter with peripheral calcifications, potentially a complex cyst, in need of further characterization. Musculoskeletal: Nondisplaced fractures of the  anterolateral left third and fourth ribs. Multiple old healed bilateral rib fractures. There are no aggressive appearing lytic or blastic lesions noted in the visualized portions of the skeleton. IMPRESSION: 1. Nondisplaced fractures of the anterolateral aspect of the left third and fourth ribs. No pneumothorax. 2. Cardiomegaly with biatrial dilatation. 3. Moderate hiatal hernia. 4. Aortic atherosclerosis, in addition to left main and 3 vessel coronary artery disease. 5. There are calcifications of the aortic valve. Echocardiographic correlation for evaluation of potential valvular dysfunction may be warranted if clinically indicated. 6. 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 7. 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 Nonemergent outpatient referral to Pulmonology for further evaluation is suggested. Aortic Atherosclerosis (ICD10-I70.0). Electronically Signed   By: Vinnie Langton M.D.   On: 12/19/2018 14:14    Assessment & Plan:   Phalen was seen today for rash.  Diagnoses and all orders for this visit:  Persistent atrial fibrillation ([place])- He has not achieved rate or rhythm. He complains of fluid overload.  I have asked him to see cardiology as soon as possible.  For now we will continue the current meds. -     EKG 12-Lead -     TSH -     Ambulatory referral to Cardiology  Stage 3b chronic kidney disease- His renal function is stable.  He will avoid nephrotoxic agents. -     Basic metabolic panel  Intrinsic eczema -     fluocinonide-emollient (LIDEX-E) 0.05 % cream; Apply 1 application topically 2 (two) times daily.  Bilateral leg edema- His troponin is negative which is reassuring that he is not experiencing ischemia.  His BNP is elevated at 535.  I recommended that he double the dose of the loop diuretic and to follow-up with cardiology to see if he needs to undergo another echocardiogram. -     Brain natriuretic peptide -      Troponin I (High Sensitivity) -     furosemide (LASIX) 40 MG tablet; Take 1 tablet (40 mg total) by mouth 2 (two) times daily.  Essential hypertension- His blood pressure is not adequately well controlled.  Will increase the dose of furosemide. -     Basic metabolic panel -     CBC with Differential/Platelet -     TSH -     furosemide (LASIX) 40 MG tablet; Take 1 tablet (40 mg total) by mouth 2 (two) times daily.  Deficiency anemia- He continues to be mildly anemic.  Recent screening for vitamin deficiencies was unremarkable but a thiamine level was not checked.  I will monitor his thiamine level. -  Vitamin B1  LVH (left ventricular hypertrophy) due to hypertensive disease, with heart failure ([place])- He has signs of fluid overload with lower extremity edema.  I recommended that he increase the furosemide dose to twice a day. -     furosemide (LASIX) 40 MG tablet; Take 1 tablet (40 mg total) by mouth 2 (two) times daily.   I have changed Marny Lowenstein. Munyan's furosemide. I am also having him start on fluocinonide-emollient. Additionally, I am having him maintain his finasteride, acetaminophen, warfarin, lisinopril, metoprolol tartrate, and diltiazem.  Meds ordered this encounter  Medications  . fluocinonide-emollient (LIDEX-E) 0.05 % cream    Sig: Apply 1 application topically 2 (two) times daily.    Dispense:  120 g    Refill:  1  . furosemide (LASIX) 40 MG tablet    Sig: Take 1 tablet (40 mg total) by mouth 2 (two) times daily.    Dispense:  180 tablet    Refill:  0     Follow-up: Return in about 3 months (around 09/07/2019).  Scarlette Calico, MD

## 2019-06-10 NOTE — Patient Instructions (Signed)

## 2019-06-11 ENCOUNTER — Ambulatory Visit: Payer: Medicare Other

## 2019-06-15 LAB — VITAMIN B1: Vitamin B1 (Thiamine): 12 nmol/L (ref 8–30)

## 2019-06-18 ENCOUNTER — Ambulatory Visit: Payer: Medicare Other | Admitting: Cardiovascular Disease

## 2019-06-18 ENCOUNTER — Other Ambulatory Visit: Payer: Self-pay

## 2019-06-18 ENCOUNTER — Ambulatory Visit (INDEPENDENT_AMBULATORY_CARE_PROVIDER_SITE_OTHER): Payer: Medicare Other | Admitting: General Practice

## 2019-06-18 ENCOUNTER — Ambulatory Visit: Payer: Medicare Other

## 2019-06-18 ENCOUNTER — Encounter: Payer: Self-pay | Admitting: Cardiovascular Disease

## 2019-06-18 VITALS — BP 118/84 | HR 68 | Ht 73.0 in | Wt 194.0 lb

## 2019-06-18 DIAGNOSIS — I5032 Chronic diastolic (congestive) heart failure: Secondary | ICD-10-CM | POA: Diagnosis not present

## 2019-06-18 DIAGNOSIS — I4811 Longstanding persistent atrial fibrillation: Secondary | ICD-10-CM | POA: Diagnosis not present

## 2019-06-18 DIAGNOSIS — I712 Thoracic aortic aneurysm, without rupture, unspecified: Secondary | ICD-10-CM

## 2019-06-18 DIAGNOSIS — Z7901 Long term (current) use of anticoagulants: Secondary | ICD-10-CM

## 2019-06-18 DIAGNOSIS — R600 Localized edema: Secondary | ICD-10-CM

## 2019-06-18 LAB — POCT INR: INR: 2.4 (ref 2.0–3.0)

## 2019-06-18 NOTE — Progress Notes (Signed)
Medical screening examination/treatment/procedure(s) were performed by non-physician practitioner and as supervising physician I was immediately available for consultation/collaboration. I agree with above. [place] Duquette, MD   

## 2019-06-18 NOTE — Patient Instructions (Signed)
Pre visit review using our clinic review tool, if applicable. No additional management support is needed unless otherwise documented below in the visit note.  Continue to take 1/2 tablet daily except 1 tablet on Mon Wed and Fridays.  Re-check in 6 weeks.  

## 2019-06-18 NOTE — Progress Notes (Signed)
Cardiology Office Note:    Date:  06/18/2019   ID:  Nathan [name], DOB 18-Jul-1940, MRN [id number]  PCP:  Janith Lima, MD  Cardiologist: Juliany Daughety  Electrophysiologist:  None   Referring MD: Janith Lima, MD   Chief Complaint  Patient presents with  . Atrial Fibrillation    History of Present Illness:    Nathan [name] is a 79 y.o. male with a hx of atrial fibrillation, hypertension, gout, hyperlipidemia.  He is seen by Dr. Rayann Heman in the electrophysiology team.  He has been having some problems with leg edema.  We are asked to see him by Dr. Ronnald Ramp for further evaluation of his leg edema.  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  This is being followed by Dr. Roxan Hockey.  Has had swelling in his feet.   Saw his primary MD.   Lasix was increased which has helped  Eats fast foods several times a week  His significant other fixes his meals,   His salt intake has been reduced.      Past Medical History:  Diagnosis Date  . Cancer ([place])    skin  . Chronic renal impairment   . Diverticulosis   . DJD (degenerative joint disease)   . Elevated PSA    Dr Junious Silk  . H/O hiatal hernia   . Hypertension   . Infected prosthetic knee joint ([place])    h/o Group B streptococcal prosthetic knee infection  . Permanent atrial fibrillation ([place])   . PONV (postoperative nausea and vomiting)    no  problems recently    Past Surgical History:  Procedure Laterality Date  . COLONOSCOPY  2003  . HERNIA REPAIR    . HIATAL HERNIA REPAIR     Dr Kaylyn Lim , laparoscopic  . INGUINAL HERNIA REPAIR Left 03/05/2013   Procedure: HERNIA REPAIR INGUINAL ADULT;  Surgeon: [name], MD;  Location: [facility];  Service: General;  Laterality: Left;  . INSERTION OF MESH Left 03/05/2013   Procedure: INSERTION OF MESH;  Surgeon: [name], MD;  Location: [facility];  Service: General;  Laterality: Left;  . TOTAL HIP ARTHROPLASTY  Right 07  . TOTAL KNEE ARTHROPLASTY Right 07    Current Medications: Current Meds  Medication Sig  . acetaminophen (TYLENOL) 650 MG CR tablet Take 650 mg by mouth every 8 (eight) hours as needed for pain.  Marland Kitchen diltiazem (CARDIZEM CD) 120 MG 24 hr capsule TAKE 1 CAPSULE BY MOUTH EVERY DAY  . finasteride (PROSCAR) 5 MG tablet Take 5 mg by mouth daily. Rx'ed by [name]  . fluocinonide-emollient (LIDEX-E) 0.05 % cream Apply 1 application topically 2 (two) times daily.  . furosemide (LASIX) 40 MG tablet Take 1 tablet (40 mg total) by mouth 2 (two) times daily.  Marland Kitchen lisinopril (ZESTRIL) 40 MG tablet Take 1 tablet (40 mg total) by mouth daily.  . metoprolol tartrate (LOPRESSOR) 100 MG tablet TAKE 1 TABLET BY MOUTH TWICE DAILY  . warfarin (COUMADIN) 5 MG tablet TAKE 1/2 TABLET BY MOUTH EVERY DAY EXCEPT TABLET 1 TABLET ON MONDAY, WEDNESDAY AND FRIDAY OR AS DIRECTED BY ANTIOAGULATION CLINIC     Allergies:   Tramadol   Social History   Socioeconomic History  . Marital status: Single    Spouse name: Not on file  . Number of children: 5  . Years of education: Not on file  . Highest education level: Not on file  Occupational History  . Not on file  Tobacco Use  . Smoking status: Former Smoker    Packs/day: 0.25    Years: 3.00    Pack years: 0.75    Types: Cigarettes    Quit date: 04/17/1991    Years since quitting: 28.1  . Smokeless tobacco: Never Used  Substance and Sexual Activity  . Alcohol use: Yes    Alcohol/week: 2.0 standard drinks    Types: 2 Glasses of wine per week    Comment: occasionally  . Drug use: No  . Sexual activity: Not Currently  Other Topics Concern  . Not on file  Social History Narrative   Retired Agricultural consultant   Lives with son   Social Determinants of Health   Financial Resource Strain: [place]   . Difficulty of Paying Living Expenses: Not hard at all  Food Insecurity: No Food Insecurity  . Worried About Charity fundraiser in the Last Year: Never true  .  Ran Out of Food in the Last Year: Never true  Transportation Needs: No Transportation Needs  . Lack of Transportation (Medical): No  . Lack of Transportation (Non-Medical): No  Physical Activity: Inactive  . Days of Exercise per Week: 0 days  . Minutes of Exercise per Session: 0 min  Stress: No Stress Concern Present  . Feeling of Stress : Not at all  Social Connections: Unknown  . Frequency of Communication with Friends and Family: More than three times a week  . Frequency of Social Gatherings with Friends and Family: More than three times a week  . Attends Religious Services: 1 to 4 times per year  . Active Member of Clubs or Organizations: Not asked  . Attends Archivist Meetings: Not asked  . Marital Status: Not on file     Family History: The patient's family history includes Brain cancer in his sister; Diabetes in his brother; Hypertension in his brother and mother; Skin cancer in his mother. There is no history of Stroke or Heart disease.  ROS:   Please see the history of present illness.     All other systems reviewed and are negative.  EKGs/Labs/Other Studies Reviewed:    The following studies were reviewed today: Chest CT scan showing asc. Aorta   EKG: June 18, 2019: Atrial fibrillation with a heart rate of 79.  No ST or T wave changes.  Recent Labs: 06/18/2018: ALT 11 06/10/2019: BUN 34; Creatinine, Ser 1.58; Hemoglobin 11.9; Platelets 262.0; Potassium 4.0; Pro B Natriuretic peptide (BNP) 535.0; Sodium 142; TSH 4.96  Recent Lipid Panel    Component Value Date/Time   CHOL 148 06/18/2018 1620   TRIG 127.0 06/18/2018 1620   HDL 47.10 06/18/2018 1620   CHOLHDL 3 06/18/2018 1620   VLDL 25.4 06/18/2018 1620   LDLCALC 76 06/18/2018 1620    Physical Exam:    VS:  BP 118/84   Pulse 68   Ht 6\' 1"  (1.854 m)   Wt 194 lb (88 kg)   SpO2 98%   BMI 25.60 kg/m     Wt Readings from Last 3 Encounters:  06/18/19 194 lb (88 kg)  06/10/19 199 lb 6 oz (90.4 kg)    03/18/19 201 lb 9.6 oz (91.4 kg)     GEN:  Well nourished, well developed in no acute distress HEENT: Normal NECK: No JVD; No carotid bruits LYMPHATICS: No lymphadenopathy CARDIAC: irreg. Irreg.   RESPIRATORY:  Clear to auscultation without rales, wheezing or rhonchi  ABDOMEN: Soft,  non-tender, non-distended MUSCULOSKELETAL:  No edema; No deformity  SKIN: Warm and dry NEUROLOGIC:  Alert and oriented x 3 PSYCHIATRIC:  Normal affect   ASSESSMENT:    1. Longstanding persistent atrial fibrillation ([place])   2. Chronic diastolic heart failure ([place])   3. Bilateral leg edema    PLAN:    In order of problems listed above:  1. Leg edema :    Presents with recent onset of leg edema.  His medical doctor increased his Lasix to 40 mg twice a day which has definitely helped.  He has multiple reasons to have leg edema including chronic atrial fibrillation with some corresponding diastolic dysfunction.  He also does not walk hardly at all and spends most of his time in a chair or wheelchair.  He uses a walker at home so does not get the usual benefit from walking in terms of leg edema.  He also eats some extra salt.  We discussed the importance of salt restriction.  I encouraged him to try to keep his legs moving.  He may consider getting a small bicycle exercise machine that he can put in front of his chair.  We discussed compression hose.  Have also given him information on the lounge doctor leg rest.  I will defer to Dr. Ronnald Ramp as to how long he needs to be on the double dose Lasix.  I have told him that he may only need Lasix once a day if he improves his salt restriction and uses the lounge doctor leg rest.  I will have him see my APP in 6 months.   Medication Adjustments/Labs and Tests Ordered: Current medicines are reviewed at length with the patient today.  Concerns regarding medicines are outlined above.  Orders Placed This Encounter  Procedures  . EKG 12-Lead  . ECHOCARDIOGRAM COMPLETE    No orders of the defined types were placed in this encounter.   Patient Instructions  Medication Instructions:  Your physician recommends that you continue on your current medications as directed. Please refer to the Current Medication list given to you today.  *If you need a refill on your cardiac medications before your next appointment, please call your pharmacy*   Lab Work: None Ordered If you have labs (blood work) drawn today and your tests are completely normal, you will receive your results only by: Marland Kitchen MyChart Message (if you have MyChart) OR . A paper copy in the mail If you have any lab test that is abnormal or we need to change your treatment, we will call you to review the results.   Testing/Procedures: Your physician has requested that you have an echocardiogram. Echocardiography is a painless test that uses sound waves to create images of your heart. It provides your doctor with information about the size and shape of your heart and how well your heart's chambers and valves are working. This procedure takes approximately one hour. There are no restrictions for this procedure.    Follow-Up: At [facility], you and your health needs are our priority.  As part of our continuing mission to provide you with exceptional heart care, we have created designated Provider Care Teams.  These Care Teams include your primary Cardiologist (physician) and Advanced Practice Providers (APPs -  Physician Assistants and Nurse Practitioners) who all work together to provide you with the care you need, when you need it.  We recommend signing up for the patient portal called "MyChart".  Sign up information is provided on this After Visit  Summary.  MyChart is used to connect with patients for Virtual Visits (Telemedicine).  Patients are able to view lab/test results, encounter notes, upcoming appointments, etc.  Non-urgent messages can be sent to your provider as well.   To learn more about  what you can do with MyChart, go to [email or web address].    Your next appointment:   6 month(s)  The format for your next appointment:   In Person  Provider:   Richardson Dopp, PA-C or Robbie Lis, PA-C   Other Instructions  For your  leg edema you  should do  the following 1. Leg elevation - I recommend the Lounge Dr. Leg rest.  See below for details  2. Salt restriction  -  Use potassium chloride instead of regular salt as a salt substitute. 3. Walk regularly 4. Compression hose - [facility] 5. Weight loss    Available on [email or web address] Or  Go to [email or web address]         Signed, Mertie Moores, MD  06/18/2019 3:42 PM    [facility]

## 2019-06-18 NOTE — Patient Instructions (Addendum)
Medication Instructions:  Your physician recommends that you continue on your current medications as directed. Please refer to the Current Medication list given to you today.  *If you need a refill on your cardiac medications before your next appointment, please call your pharmacy*   Lab Work: None Ordered If you have labs (blood work) drawn today and your tests are completely normal, you will receive your results only by: Marland Kitchen MyChart Message (if you have MyChart) OR . A paper copy in the mail If you have any lab test that is abnormal or we need to change your treatment, we will call you to review the results.   Testing/Procedures: Your physician has requested that you have an echocardiogram. Echocardiography is a painless test that uses sound waves to create images of your heart. It provides your doctor with information about the size and shape of your heart and how well your heart's chambers and valves are working. This procedure takes approximately one hour. There are no restrictions for this procedure.    Follow-Up: At [facility], you and your health needs are our priority.  As part of our continuing mission to provide you with exceptional heart care, we have created designated Provider Care Teams.  These Care Teams include your primary Cardiologist (physician) and Advanced Practice Providers (APPs -  Physician Assistants and Nurse Practitioners) who all work together to provide you with the care you need, when you need it.  We recommend signing up for the patient portal called "MyChart".  Sign up information is provided on this After Visit Summary.  MyChart is used to connect with patients for Virtual Visits (Telemedicine).  Patients are able to view lab/test results, encounter notes, upcoming appointments, etc.  Non-urgent messages can be sent to your provider as well.   To learn more about what you can do with MyChart, go to [email or web address].    Your next appointment:   6  month(s)  The format for your next appointment:   In Person  Provider:   Richardson Dopp, PA-C or Robbie Lis, PA-C   Other Instructions  For your  leg edema you  should do  the following 1. Leg elevation - I recommend the Lounge Dr. Leg rest.  See below for details  2. Salt restriction  -  Use potassium chloride instead of regular salt as a salt substitute. 3. Walk regularly 4. Compression hose - [facility] 5. Weight loss    Available on [email or web address] Or  Go to [email or web address]

## 2019-06-30 ENCOUNTER — Other Ambulatory Visit: Payer: Self-pay | Admitting: Internal Medicine

## 2019-06-30 DIAGNOSIS — M1000 Idiopathic gout, unspecified site: Secondary | ICD-10-CM

## 2019-07-01 ENCOUNTER — Ambulatory Visit (INDEPENDENT_AMBULATORY_CARE_PROVIDER_SITE_OTHER): Payer: Medicare Other | Admitting: Internal Medicine

## 2019-07-01 ENCOUNTER — Encounter: Payer: Self-pay | Admitting: Internal Medicine

## 2019-07-01 ENCOUNTER — Other Ambulatory Visit: Payer: Self-pay

## 2019-07-01 VITALS — BP 120/72 | HR 79 | Temp 97.7°F | Ht 73.0 in | Wt 196.0 lb

## 2019-07-01 DIAGNOSIS — L301 Dyshidrosis [pompholyx]: Secondary | ICD-10-CM | POA: Diagnosis not present

## 2019-07-01 DIAGNOSIS — I110 Hypertensive heart disease with heart failure: Secondary | ICD-10-CM

## 2019-07-01 DIAGNOSIS — L2084 Intrinsic (allergic) eczema: Secondary | ICD-10-CM | POA: Diagnosis not present

## 2019-07-01 DIAGNOSIS — I10 Essential (primary) hypertension: Secondary | ICD-10-CM | POA: Diagnosis not present

## 2019-07-01 DIAGNOSIS — I4811 Longstanding persistent atrial fibrillation: Secondary | ICD-10-CM

## 2019-07-01 MED ORDER — FLUOCINONIDE-E 0.05 % EX CREA: 1.0000 "application " | TOPICAL_CREAM | Freq: Two times a day (BID) | CUTANEOUS | 5 refills | Status: DC

## 2019-07-01 MED ORDER — LEVOCETIRIZINE DIHYDROCHLORIDE 5 MG PO TABS: 5.0000 mg | ORAL_TABLET | Freq: Every evening | ORAL | 1 refills | Status: DC

## 2019-07-01 NOTE — Patient Instructions (Signed)

## 2019-07-01 NOTE — Progress Notes (Signed)
Subjective:  Patient ID: HERB EURESTI, male    DOB: March 21, 1941  Age: 79 y.o. MRN: [id number]  CC: Rash and Anemia  This visit occurred during the SARS-CoV-2 public health emergency.  Safety protocols were in place, including screening questions prior to the visit, additional usage of staff PPE, and extensive cleaning of exam room while observing appropriate contact time as indicated for disinfecting solutions.    HPI Lamarius Curro Dutson presents for f/up -he continues to complain of an itchy rash on his back.  He lives alone but has a caretaker that comes to see him once a day.  She applies a cream to his back once a day and he gets some relief but is not getting the second dosage and therefore later in the day the itchy rash returns.  He also returns for follow-up on congestive heart failure and atrial fibrillation.  He has seen cardiology since I last saw him.  He tells me the swelling in his legs has gotten much better.  Outpatient Medications Prior to Visit  Medication Sig Dispense Refill   acetaminophen (TYLENOL) 650 MG CR tablet Take 650 mg by mouth every 8 (eight) hours as needed for pain.     allopurinol (ZYLOPRIM) 100 MG tablet TAKE 1 TABLET(100 MG) BY MOUTH DAILY 90 tablet 1   diltiazem (CARDIZEM CD) 120 MG 24 hr capsule TAKE 1 CAPSULE BY MOUTH EVERY DAY 90 capsule 2   finasteride (PROSCAR) 5 MG tablet Take 5 mg by mouth daily. Rx'ed by [name]     furosemide (LASIX) 40 MG tablet Take 1 tablet (40 mg total) by mouth 2 (two) times daily. 180 tablet 0   lisinopril (ZESTRIL) 40 MG tablet Take 1 tablet (40 mg total) by mouth daily. 90 tablet 3   metoprolol tartrate (LOPRESSOR) 100 MG tablet TAKE 1 TABLET BY MOUTH TWICE DAILY 180 tablet 3   warfarin (COUMADIN) 5 MG tablet TAKE 1/2 TABLET BY MOUTH EVERY DAY EXCEPT TABLET 1 TABLET ON MONDAY, WEDNESDAY AND FRIDAY OR AS DIRECTED BY ANTIOAGULATION CLINIC 30 tablet 3   fluocinonide-emollient (LIDEX-E) 0.05 % cream Apply 1  application topically 2 (two) times daily. 120 g 1   No facility-administered medications prior to visit.    ROS Review of Systems  Constitutional: Negative for diaphoresis, fatigue and unexpected weight change.  HENT: Negative.   Eyes: Negative.   Respiratory: Negative for cough, chest tightness, shortness of breath and wheezing.   Cardiovascular: Positive for leg swelling. Negative for chest pain and palpitations.  Gastrointestinal: Negative for abdominal pain, constipation, diarrhea, nausea and vomiting.  Endocrine: Negative.   Genitourinary: Negative for difficulty urinating.  Musculoskeletal: Negative for arthralgias and myalgias.  Skin: Positive for rash. Negative for color change.  Neurological: Negative.  Negative for dizziness and light-headedness.  Hematological: Negative for adenopathy. Does not bruise/bleed easily.  Psychiatric/Behavioral: Negative.     Objective:  BP 120/72 (BP Location: Left Arm, Patient Position: Sitting, Cuff Size: Normal)    Pulse 79    Temp 97.7 F (36.5 C) (Oral)    Ht 6\' 1"  (1.854 m)    Wt 196 lb (88.9 kg)    SpO2 99%    BMI 25.86 kg/m   BP Readings from Last 3 Encounters:  07/01/19 120/72  06/18/19 118/84  06/10/19 (!) 150/90    Wt Readings from Last 3 Encounters:  07/01/19 196 lb (88.9 kg)  06/18/19 194 lb (88 kg)  06/10/19 199 lb 6 oz (90.4 kg)  Physical Exam Vitals reviewed.  Constitutional:      Appearance: He is ill-appearing (in a wheelchair).  HENT:     Nose: Nose normal.     Mouth/Throat:     Mouth: Mucous membranes are moist.  Eyes:     General: No scleral icterus.    Conjunctiva/sclera: Conjunctivae normal.  Cardiovascular:     Rate and Rhythm: Normal rate. Rhythm irregularly irregular.     Heart sounds: S1 normal and S2 normal. Murmur present. Systolic murmur present with a grade of 1/6. No gallop.   Pulmonary:     Breath sounds: No stridor. No wheezing, rhonchi or rales.  Abdominal:     General: Abdomen is  flat.     Palpations: There is no mass.     Tenderness: There is no abdominal tenderness.  Musculoskeletal:        General: Normal range of motion.     Cervical back: Neck supple.     Right lower leg: Edema (trace pitting) present.     Left lower leg: Edema (trace pitting) present.  Lymphadenopathy:     Cervical: No cervical adenopathy.  Skin:    General: Skin is warm.     Coloration: Skin is not pale.     Findings: Rash present. No lesion.     Comments: Over his right upper and lower back.  There are a large swath of coalesced papules with faint scale and mild erythema.  The edges are very slightly elevated.  See photos.  Neurological:     General: No focal deficit present.     Mental Status: He is alert.     Lab Results  Component Value Date   WBC 8.3 06/10/2019   HGB 11.9 (L) 06/10/2019   HCT 37.4 (L) 06/10/2019   PLT 262.0 06/10/2019   GLUCOSE 91 06/10/2019   CHOL 148 06/18/2018   TRIG 127.0 06/18/2018   HDL 47.10 06/18/2018   LDLCALC 76 06/18/2018   ALT 11 06/18/2018   AST 12 06/18/2018   NA 142 06/10/2019   K 4.0 06/10/2019   CL 106 06/10/2019   CREATININE 1.58 (H) 06/10/2019   BUN 34 (H) 06/10/2019   CO2 26 06/10/2019   TSH 4.96 (H) 06/10/2019   INR 2.4 06/18/2019   HGBA1C 5.4 01/20/2015    DG Chest 2 View  Result Date: 12/19/2018 CLINICAL DATA:  Left rib pain following a fall down steps 3 days ago. EXAM: CHEST - 2 VIEW COMPARISON:  01/21/2015. FINDINGS: Stable mildly enlarged cardiac silhouette. Small to moderate-sized hiatal hernia. Minimal patchy density at the left lung base. Mildly prominent pulmonary vasculature. Left shoulder degenerative changes with superior migration of the humeral head, compatible with a chronic rotator cuff tear. Thoracic spine degenerative changes. No fracture or pneumothorax seen. IMPRESSION: 1. Minimal patchy atelectasis or pneumonia at the left lung base. 2. Stable mild cardiomegaly and mild pulmonary vascular congestion. 3. Small  to moderate-sized hiatal hernia. Electronically Signed   By: Claudie Revering M.D.   On: 12/19/2018 11:05   CT Chest Wo Contrast  Result Date: 12/19/2018 CLINICAL DATA:  79 year old male with history of trauma from a fall complaining of chest wall pain. Possible rib fracture. EXAM: CT CHEST WITHOUT CONTRAST TECHNIQUE: Multidetector CT imaging of the chest was performed following the standard protocol without IV contrast. COMPARISON:  No priors. FINDINGS: Cardiovascular: Heart size is mildly enlarged with biatrial dilatation. Trace amount of pericardial fluid and/or thickening, unlikely to be of any hemodynamic significance at this time.  No associated pericardial calcification. There is aortic atherosclerosis, as well as atherosclerosis of the great vessels of the mediastinum and the coronary arteries, including calcified atherosclerotic plaque in the left main, left anterior descending, left circumflex and right coronary arteries. Calcifications of the aortic valve. Mild aneurysmal dilatation of the ascending thoracic aorta (4.5 cm in diameter). Mediastinum/Nodes: No pathologically enlarged mediastinal or hilar lymph nodes. Moderate-sized hiatal hernia. No axillary lymphadenopathy. Lungs/Pleura: No pneumothorax. No acute consolidative airspace disease. No pleural effusions. Scattered areas of mild septal thickening most evident throughout the mid to lower lungs bilaterally, particularly in the periphery, potentially indicative of very early fibrosis. Upper Abdomen: Multiple low-attenuation lesions throughout the visualized liver, spleen and kidneys, incompletely characterized on today's noncontrast CT examination, but statistically likely to represent cysts. The largest of these lesions is in the upper pole of the right kidney measuring 9.7 cm in diameter with peripheral calcifications, potentially a complex cyst, in need of further characterization. Musculoskeletal: Nondisplaced fractures of the anterolateral left  third and fourth ribs. Multiple old healed bilateral rib fractures. There are no aggressive appearing lytic or blastic lesions noted in the visualized portions of the skeleton. IMPRESSION: 1. Nondisplaced fractures of the anterolateral aspect of the left third and fourth ribs. No pneumothorax. 2. Cardiomegaly with biatrial dilatation. 3. Moderate hiatal hernia. 4. Aortic atherosclerosis, in addition to left main and 3 vessel coronary artery disease. 5. There are calcifications of the aortic valve. Echocardiographic correlation for evaluation of potential valvular dysfunction may be warranted if clinically indicated. 6. 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 7. 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 Nonemergent outpatient referral to Pulmonology for further evaluation is suggested. Aortic Atherosclerosis (ICD10-I70.0). Electronically Signed   By: Vinnie Langton M.D.   On: 12/19/2018 14:14    Assessment & Plan:   Jeren was seen today for rash and anemia.  Diagnoses and all orders for this visit:  Intrinsic eczema- He has had some improvement with the topical steroid but he only gets the treatment once a day.  For the next few weeks his caretaker will apply it twice a day.  I also recommended that he take an oral antihistamine for the itching.  If this does not resolve soon then will consider doing a biopsy to screen for secondary causes like lymphoid infiltration and or tinea. -     fluocinonide-emollient (LIDEX-E) 0.05 % cream; Apply 1 application topically 2 (two) times daily. -     levocetirizine (XYZAL) 5 MG tablet; Take 1 tablet (5 mg total) by mouth every evening.  Eczema, dyshidrotic -     fluocinonide-emollient (LIDEX-E) 0.05 % cream; Apply 1 application topically 2 (two) times daily.  Longstanding persistent atrial fibrillation ([place])- He is maintaining adequate rate control.  Essential hypertension- His blood pressure is adequately  well controlled.  LVH (left ventricular hypertrophy) due to hypertensive disease, with heart failure ([place])- His volume status is improving.  He is tolerating the twice daily loop diuretic.  Will continue at this dose for now.   I am having [place] start on levocetirizine. I am also having him maintain his finasteride, acetaminophen, warfarin, lisinopril, metoprolol tartrate, diltiazem, furosemide, allopurinol, and fluocinonide-emollient.  Meds ordered this encounter  Medications   fluocinonide-emollient (LIDEX-E) 0.05 % cream    Sig: Apply 1 application topically 2 (two) times daily.    Dispense:  120 g    Refill:  5   levocetirizine (XYZAL) 5 MG tablet  Sig: Take 1 tablet (5 mg total) by mouth every evening.    Dispense:  90 tablet    Refill:  1     Follow-up: Return in about 3 months (around 10/01/2019).  Scarlette Calico, MD

## 2019-07-02 ENCOUNTER — Telehealth: Payer: Self-pay | Admitting: Internal Medicine

## 2019-07-02 NOTE — Telephone Encounter (Signed)
New message:   Pt is calling and states that the pharmacy keeps saying they don't have this medication fluocinonide-emollient (LIDEX-E) 0.05 % cream and he would like for [place] to see what is going on. He states every time he calls they give him another date

## 2019-07-03 ENCOUNTER — Other Ambulatory Visit: Payer: Self-pay | Admitting: Internal Medicine

## 2019-07-03 DIAGNOSIS — L301 Dyshidrosis [pompholyx]: Secondary | ICD-10-CM

## 2019-07-03 DIAGNOSIS — L2084 Intrinsic (allergic) eczema: Secondary | ICD-10-CM

## 2019-07-03 MED ORDER — TRIAMCINOLONE ACETONIDE 0.5 % EX CREA: 1.0000 "application " | TOPICAL_CREAM | Freq: Three times a day (TID) | CUTANEOUS | 1 refills | Status: DC

## 2019-07-03 NOTE — Telephone Encounter (Signed)
Pt has had a lot of trouble getting the Lidex cream filled due to [facility] having to order the medication.   Is there an alternative?

## 2019-07-03 NOTE — Telephone Encounter (Signed)
New message:    Pt's wife is calling and states that they would like to know if there is another option for this medication since the pharmacy will not have this medication until the 1st of April. She states if something else will be sent in to please call her when it has been sent to the pharmacy.

## 2019-07-05 ENCOUNTER — Other Ambulatory Visit: Payer: Self-pay

## 2019-07-05 ENCOUNTER — Ambulatory Visit (HOSPITAL_COMMUNITY): Payer: Medicare Other | Attending: Internal Medicine

## 2019-07-05 DIAGNOSIS — I5032 Chronic diastolic (congestive) heart failure: Secondary | ICD-10-CM

## 2019-07-05 DIAGNOSIS — I4811 Longstanding persistent atrial fibrillation: Secondary | ICD-10-CM

## 2019-07-05 DIAGNOSIS — R600 Localized edema: Secondary | ICD-10-CM | POA: Diagnosis not present

## 2019-07-30 ENCOUNTER — Ambulatory Visit: Payer: Medicare Other

## 2019-08-06 ENCOUNTER — Other Ambulatory Visit: Payer: Self-pay

## 2019-08-06 ENCOUNTER — Ambulatory Visit (INDEPENDENT_AMBULATORY_CARE_PROVIDER_SITE_OTHER): Payer: Medicare Other | Admitting: General Practice

## 2019-08-06 DIAGNOSIS — Z7901 Long term (current) use of anticoagulants: Secondary | ICD-10-CM

## 2019-08-06 DIAGNOSIS — I4891 Unspecified atrial fibrillation: Secondary | ICD-10-CM

## 2019-08-06 LAB — POCT INR: INR: 2.6 (ref 2.0–3.0)

## 2019-08-06 NOTE — Patient Instructions (Addendum)
Pre visit review using our clinic review tool, if applicable. No additional management support is needed unless otherwise documented below in the visit note.  Continue to take 1/2 tablet daily except 1 tablet on Mon Wed and Fridays.  Re-check in 6 weeks.  

## 2019-08-06 NOTE — Progress Notes (Signed)
I have reviewed and agree.

## 2019-08-10 ENCOUNTER — Other Ambulatory Visit: Payer: Self-pay | Admitting: Internal Medicine

## 2019-08-10 DIAGNOSIS — Z7901 Long term (current) use of anticoagulants: Secondary | ICD-10-CM

## 2019-09-05 ENCOUNTER — Other Ambulatory Visit: Payer: Self-pay | Admitting: Internal Medicine

## 2019-09-05 DIAGNOSIS — R600 Localized edema: Secondary | ICD-10-CM

## 2019-09-05 DIAGNOSIS — I110 Hypertensive heart disease with heart failure: Secondary | ICD-10-CM

## 2019-09-05 DIAGNOSIS — I10 Essential (primary) hypertension: Secondary | ICD-10-CM

## 2019-09-17 ENCOUNTER — Other Ambulatory Visit: Payer: Self-pay

## 2019-09-17 ENCOUNTER — Ambulatory Visit (INDEPENDENT_AMBULATORY_CARE_PROVIDER_SITE_OTHER): Payer: Medicare Other | Admitting: General Practice

## 2019-09-17 DIAGNOSIS — I4811 Longstanding persistent atrial fibrillation: Secondary | ICD-10-CM

## 2019-09-17 DIAGNOSIS — Z7901 Long term (current) use of anticoagulants: Secondary | ICD-10-CM

## 2019-09-17 LAB — POCT INR: INR: 4 — AB (ref 2.0–3.0)

## 2019-09-17 NOTE — Patient Instructions (Signed)
Pre visit review using our clinic review tool, if applicable. No additional management support is needed unless otherwise documented below in the visit note.  Hold coumadin tomorrow and Wednesday (6/2 and 6/3) and then continue to take 1/2 tablet daily except 1 tablet on Mon Wed and Fridays.  Re-check in 3 weeks.

## 2019-09-17 NOTE — Progress Notes (Signed)
I have reviewed and agree.

## 2019-10-01 ENCOUNTER — Other Ambulatory Visit: Payer: Self-pay | Admitting: Internal Medicine

## 2019-10-01 ENCOUNTER — Other Ambulatory Visit: Payer: Self-pay

## 2019-10-01 ENCOUNTER — Encounter: Payer: Self-pay | Admitting: Internal Medicine

## 2019-10-01 ENCOUNTER — Ambulatory Visit: Payer: Medicare Other | Admitting: Internal Medicine

## 2019-10-01 VITALS — BP 104/64 | HR 84 | Temp 98.3°F | Ht 73.0 in | Wt 199.4 lb

## 2019-10-01 DIAGNOSIS — B354 Tinea corporis: Secondary | ICD-10-CM

## 2019-10-01 DIAGNOSIS — B359 Dermatophytosis, unspecified: Secondary | ICD-10-CM

## 2019-10-01 DIAGNOSIS — R21 Rash and other nonspecific skin eruption: Secondary | ICD-10-CM | POA: Insufficient documentation

## 2019-10-01 NOTE — Progress Notes (Signed)
Subjective:  Patient ID: Nathan [name], male    DOB: 1941-01-12  Age: 78 y.o. MRN: [id number]  CC: Rash  This visit occurred during the SARS-CoV-2 public health emergency.  Safety protocols were in place, including screening questions prior to the visit, additional usage of staff PPE, and extensive cleaning of exam room while observing appropriate contact time as indicated for disinfecting solutions.    HPI Nathan [name] presents for f/up about a rash on his torso.  The rash has not responded well to topical steroids.  He continues to be itchy and spreads around his back.  Outpatient Medications Prior to Visit  Medication Sig Dispense Refill  . acetaminophen (TYLENOL) 650 MG CR tablet Take 650 mg by mouth every 8 (eight) hours as needed for pain.    Marland Kitchen allopurinol (ZYLOPRIM) 100 MG tablet TAKE 1 TABLET(100 MG) BY MOUTH DAILY 90 tablet 1  . diltiazem (CARDIZEM CD) 120 MG 24 hr capsule TAKE 1 CAPSULE BY MOUTH EVERY DAY 90 capsule 2  . finasteride (PROSCAR) 5 MG tablet Take 5 mg by mouth daily. Rx'ed by [name]    . furosemide (LASIX) 40 MG tablet TAKE 1 TABLET(40 MG) BY MOUTH TWICE DAILY 180 tablet 0  . levocetirizine (XYZAL) 5 MG tablet Take 1 tablet (5 mg total) by mouth every evening. 90 tablet 1  . metoprolol tartrate (LOPRESSOR) 100 MG tablet TAKE 1 TABLET BY MOUTH TWICE DAILY 180 tablet 3  . triamcinolone cream (KENALOG) 0.5 % Apply 1 application topically 3 (three) times daily. 454 g 1  . warfarin (COUMADIN) 5 MG tablet TAKE 1/2 TABLET BY MOUTH EVERY DAY EXCEPT TAKE 1 TABLET ON MONDAY, WEDNESDAY AND FRIDAY OR AS DIRECTED BY ANTIOAGULATION CLINIC 30 tablet 3  . lisinopril (ZESTRIL) 40 MG tablet Take 1 tablet (40 mg total) by mouth daily. 90 tablet 3   No facility-administered medications prior to visit.    ROS Review of Systems  Constitutional: Negative.  Negative for chills, diaphoresis, fatigue and fever.  HENT: Negative.   Eyes: Negative.   Respiratory:  Negative for cough and chest tightness.   Cardiovascular: Negative for chest pain, palpitations and leg swelling.  Gastrointestinal: Negative for abdominal pain, nausea and vomiting.  Endocrine: Negative.   Genitourinary: Negative.  Negative for difficulty urinating.  Musculoskeletal: Negative for arthralgias and myalgias.  Skin: Positive for rash. Negative for color change.  Neurological: Negative.   Hematological: Negative for adenopathy. Does not bruise/bleed easily.  Psychiatric/Behavioral: Negative.     Objective:  BP 104/64 (BP Location: Left Arm, Patient Position: Sitting, Cuff Size: Large)   Pulse 84   Temp 98.3 F (36.8 C) (Oral)   Ht 6\' 1"  (1.854 m)   Wt 199 lb 6 oz (90.4 kg)   SpO2 97%   BMI 26.30 kg/m   BP Readings from Last 3 Encounters:  10/01/19 104/64  07/01/19 120/72  06/18/19 118/84    Wt Readings from Last 3 Encounters:  10/01/19 199 lb 6 oz (90.4 kg)  07/01/19 196 lb (88.9 kg)  06/18/19 194 lb (88 kg)    Physical Exam Vitals reviewed.  Constitutional:      Appearance: He is ill-appearing (in a wheelchair).  HENT:     Nose: Nose normal.  Eyes:     General: No scleral icterus.    Conjunctiva/sclera: Conjunctivae normal.  Cardiovascular:     Rate and Rhythm: Normal rate and regular rhythm.     Heart sounds: No murmur heard.  Pulmonary:     Effort: Pulmonary effort is normal.     Breath sounds: No wheezing, rhonchi or rales.  Abdominal:     General: Abdomen is flat.  Musculoskeletal:        General: Normal range of motion.     Cervical back: Neck supple.  Lymphadenopathy:     Cervical: No cervical adenopathy.  Skin:    Findings: Rash present.     Comments: Scattered asymmetrically across his posterior torso are scaly, shiny, coalesced, erythematous macules and papules.  Around the neck there are some raised serpiginous edges.  See photos.  Neurological:     Mental Status: He is alert.     Lab Results  Component Value Date   WBC 8.3  06/10/2019   HGB 11.9 (L) 06/10/2019   HCT 37.4 (L) 06/10/2019   PLT 262.0 06/10/2019   GLUCOSE 91 06/10/2019   CHOL 148 06/18/2018   TRIG 127.0 06/18/2018   HDL 47.10 06/18/2018   LDLCALC 76 06/18/2018   ALT 11 06/18/2018   AST 12 06/18/2018   NA 142 06/10/2019   K 4.0 06/10/2019   CL 106 06/10/2019   CREATININE 1.58 (H) 06/10/2019   BUN 34 (H) 06/10/2019   CO2 26 06/10/2019   TSH 4.96 (H) 06/10/2019   INR 4.0 (A) 09/17/2019   HGBA1C 5.4 01/20/2015    DG Chest 2 View  Result Date: 12/19/2018 CLINICAL DATA:  Left rib pain following a fall down steps 3 days ago. EXAM: CHEST - 2 VIEW COMPARISON:  01/21/2015. FINDINGS: Stable mildly enlarged cardiac silhouette. Small to moderate-sized hiatal hernia. Minimal patchy density at the left lung base. Mildly prominent pulmonary vasculature. Left shoulder degenerative changes with superior migration of the humeral head, compatible with a chronic rotator cuff tear. Thoracic spine degenerative changes. No fracture or pneumothorax seen. IMPRESSION: 1. Minimal patchy atelectasis or pneumonia at the left lung base. 2. Stable mild cardiomegaly and mild pulmonary vascular congestion. 3. Small to moderate-sized hiatal hernia. Electronically Signed   By: Claudie Revering M.D.   On: 12/19/2018 11:05   CT Chest Wo Contrast  Result Date: 12/19/2018 CLINICAL DATA:  79 year old male with history of trauma from a fall complaining of chest wall pain. Possible rib fracture. EXAM: CT CHEST WITHOUT CONTRAST TECHNIQUE: Multidetector CT imaging of the chest was performed following the standard protocol without IV contrast. COMPARISON:  No priors. FINDINGS: Cardiovascular: Heart size is mildly enlarged with biatrial dilatation. Trace amount of pericardial fluid and/or thickening, unlikely to be of any hemodynamic significance at this time. No associated pericardial calcification. There is aortic atherosclerosis, as well as atherosclerosis of the great vessels of the  mediastinum and the coronary arteries, including calcified atherosclerotic plaque in the left main, left anterior descending, left circumflex and right coronary arteries. Calcifications of the aortic valve. Mild aneurysmal dilatation of the ascending thoracic aorta (4.5 cm in diameter). Mediastinum/Nodes: No pathologically enlarged mediastinal or hilar lymph nodes. Moderate-sized hiatal hernia. No axillary lymphadenopathy. Lungs/Pleura: No pneumothorax. No acute consolidative airspace disease. No pleural effusions. Scattered areas of mild septal thickening most evident throughout the mid to lower lungs bilaterally, particularly in the periphery, potentially indicative of very early fibrosis. Upper Abdomen: Multiple low-attenuation lesions throughout the visualized liver, spleen and kidneys, incompletely characterized on today's noncontrast CT examination, but statistically likely to represent cysts. The largest of these lesions is in the upper pole of the right kidney measuring 9.7 cm in diameter with peripheral calcifications, potentially a complex cyst, in need of  further characterization. Musculoskeletal: Nondisplaced fractures of the anterolateral left third and fourth ribs. Multiple old healed bilateral rib fractures. There are no aggressive appearing lytic or blastic lesions noted in the visualized portions of the skeleton. IMPRESSION: 1. Nondisplaced fractures of the anterolateral aspect of the left third and fourth ribs. No pneumothorax. 2. Cardiomegaly with biatrial dilatation. 3. Moderate hiatal hernia. 4. Aortic atherosclerosis, in addition to left main and 3 vessel coronary artery disease. 5. There are calcifications of the aortic valve. Echocardiographic correlation for evaluation of potential valvular dysfunction may be warranted if clinically indicated. 6. 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 7. 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  Nonemergent outpatient referral to Pulmonology for further evaluation is suggested. Aortic Atherosclerosis (ICD10-I70.0). Electronically Signed   By: Vinnie Langton M.D.   On: 12/19/2018 14:14    After informed verbal consent was obtained. Using Betadine for cleansing and 2% Lidocaine with epinephrine for anesthetic. With sterile technique a 4 mm punch biopsy was used to obtain a biopsy specimen of the lesion over the right posterior trapezius. Hemostasis was obtained by pressure and wound was sutured (6.0 nylon, one suture). Specimen sent for pathology. Antibiotic dressing is applied. The procedure was well tolerated without complications.  Assessment & Plan:   Quinlan was seen today for rash.  Diagnoses and all orders for this visit:  Rash of back- Biopsy completed without complications. -     Dermatology pathology  Tinea corporis- The biopsy is positive for hyphae.  Will treat with a 14-day course of oral terbinafine. -     terbinafine (LAMISIL) 250 MG tablet; Take 1 tablet (250 mg total) by mouth daily for 14 days.   I have discontinued Jonerik W. Steckman's lisinopril. I am also having him start on terbinafine. Additionally, I am having him maintain his finasteride, acetaminophen, metoprolol tartrate, diltiazem, allopurinol, levocetirizine, triamcinolone cream, warfarin, and furosemide.  Meds ordered this encounter  Medications  . terbinafine (LAMISIL) 250 MG tablet    Sig: Take 1 tablet (250 mg total) by mouth daily for 14 days.    Dispense:  14 tablet    Refill:  0     Follow-up: Return in about 1 week (around 10/08/2019).  Scarlette Calico, MD

## 2019-10-01 NOTE — Patient Instructions (Signed)
Skin Biopsy A skin biopsy is a procedure to remove a sample of your skin (lesion) so that it can be checked under a microscope. You may need a skin biopsy if you have a skin disease or abnormal changes in your skin. Tell a health care provider about:  Any allergies you have.  All medicines you are taking, including vitamins, herbs, eye drops, creams, and over-the-counter medicines.  Any problems you or family members have had with anesthetic medicines.  Any blood disorders you have.  Any surgeries you have had.  Any medical conditions you have or have had.  Whether you are pregnant or may be pregnant. What are the risks? Generally, this is a safe procedure. However, problems may occur, including:  Infection.  Bleeding.  Allergic reaction to medicines.  Scarring. What happens before the procedure?  Ask your health care provider about: ? Changing or stopping your regular medicines. This is especially important if you are taking blood thinners. ? Taking medicines such as aspirin and ibuprofen. These medicines can thin your blood. Do not take these medicines unless your health care provider tells you to take them. ? Taking over-the-counter medicines, vitamins, herbs, and supplements.  Ask your health care provider if you will need someone to take you home from the hospital or clinic after the procedure.  Ask your health care provider how your biopsy site will be marked or identified.  Ask your health care provider what steps will be taken to help prevent infection. These may include: ? Removing hair at the surgery site. ? Washing skin with a germ-killing soap. ? Taking antibiotic medicine. What happens during the procedure?   You may be given medicine to numb the area (local anesthetic).  Your health care provider will take a sample using one of these steps, depending on the type of skin problem that you have: ? Shave biopsy. Your health care provider will shave away  layers of your skin lesion with a sharp blade. After shaving, a gel or ointment may be used to control bleeding. ? Punch biopsy. Your health care provider will use a tool to remove all or part of the lesion. This leaves a small hole about the width of a pencil eraser. The area may be covered with a gel or ointment. ? Excisional or incisional biopsy. Your health care provider will use a surgical blade to remove all or part of your lesion.  Your skin biopsy site may be closed with stitches (sutures).  A bandage (dressing) will be applied. The procedure may vary among health care providers and hospitals. What happens after the procedure?  Your skin sample will be sent to a lab for tests.  Your skin biopsy site will be watched to make sure that it stops bleeding.  You will be given instructions on how to care for your biopsy site.  It is up to you to get the results of your procedure. Ask your health care provider, or the department that is doing the procedure, when your results will be ready. Summary  A skin biopsy is a procedure to remove a sample of your skin (lesion) so that it can be checked under a microscope.  Tell a health care provider about your medical history and all medicines you are taking, including vitamins, herbs, eye drops, creams, and over-the-counter medicines.  Before the procedure, ask your health care provider about changing or stopping your regular medicines.  During the procedure, your health care provider will take a skin sample from  the area where you have the skin problem.  After the procedure, your skin sample will be sent to a laboratory for testing. This information is not intended to replace advice given to you by your health care provider. Make sure you discuss any questions you have with your health care provider. Document Revised: 10/02/2017 Document Reviewed: 10/02/2017 Elsevier Patient Education  2020 [employer]  

## 2019-10-04 ENCOUNTER — Telehealth: Payer: Self-pay | Admitting: Internal Medicine

## 2019-10-04 NOTE — Telephone Encounter (Signed)
New message:   Pt is calling in reference to his biopsy results. Please advise.

## 2019-10-04 NOTE — Telephone Encounter (Signed)
Pt contacted and informed that the biopsy results were not in yet.

## 2019-10-06 DIAGNOSIS — B354 Tinea corporis: Secondary | ICD-10-CM | POA: Insufficient documentation

## 2019-10-06 MED ORDER — TERBINAFINE HCL 250 MG PO TABS: 250.0000 mg | ORAL_TABLET | Freq: Every day | ORAL | 0 refills | Status: AC

## 2019-10-08 ENCOUNTER — Ambulatory Visit: Payer: Medicare Other

## 2019-10-10 ENCOUNTER — Encounter: Payer: Self-pay | Admitting: Internal Medicine

## 2019-10-10 ENCOUNTER — Ambulatory Visit (INDEPENDENT_AMBULATORY_CARE_PROVIDER_SITE_OTHER): Payer: Medicare Other | Admitting: Internal Medicine

## 2019-10-10 ENCOUNTER — Ambulatory Visit (INDEPENDENT_AMBULATORY_CARE_PROVIDER_SITE_OTHER): Payer: Medicare Other | Admitting: General Practice

## 2019-10-10 ENCOUNTER — Other Ambulatory Visit: Payer: Self-pay

## 2019-10-10 VITALS — BP 136/88 | HR 75 | Temp 97.9°F

## 2019-10-10 DIAGNOSIS — Z7901 Long term (current) use of anticoagulants: Secondary | ICD-10-CM | POA: Diagnosis not present

## 2019-10-10 DIAGNOSIS — B354 Tinea corporis: Secondary | ICD-10-CM

## 2019-10-10 DIAGNOSIS — I4891 Unspecified atrial fibrillation: Secondary | ICD-10-CM

## 2019-10-10 LAB — POCT INR: INR: 3.5 — AB (ref 2.0–3.0)

## 2019-10-10 NOTE — Progress Notes (Signed)
Subjective:  Patient ID: Nathan [name], male    DOB: August 07, 1940  Age: 79 y.o. MRN: [id number]  CC: Rash  This visit occurred during the SARS-CoV-2 public health emergency.  Safety protocols were in place, including screening questions prior to the visit, additional usage of staff PPE, and extensive cleaning of exam room while observing appropriate contact time as indicated for disinfecting solutions.    HPI Nathan [name] presents for f/up - He underwent a biopsy about a week ago. The area over his right posterior trapezius has healed nicely. The biopsy was positive for hyphae. He has responded very well to oral terbinafine. He says the itching and the rash have significantly improved.  Outpatient Medications Prior to Visit  Medication Sig Dispense Refill  . acetaminophen (TYLENOL) 650 MG CR tablet Take 650 mg by mouth every 8 (eight) hours as needed for pain.    Marland Kitchen allopurinol (ZYLOPRIM) 100 MG tablet TAKE 1 TABLET(100 MG) BY MOUTH DAILY 90 tablet 1  . diltiazem (CARDIZEM CD) 120 MG 24 hr capsule TAKE 1 CAPSULE BY MOUTH EVERY DAY 90 capsule 2  . finasteride (PROSCAR) 5 MG tablet Take 5 mg by mouth daily. Rx'ed by [name]    . furosemide (LASIX) 40 MG tablet TAKE 1 TABLET(40 MG) BY MOUTH TWICE DAILY 180 tablet 0  . levocetirizine (XYZAL) 5 MG tablet Take 1 tablet (5 mg total) by mouth every evening. 90 tablet 1  . metoprolol tartrate (LOPRESSOR) 100 MG tablet TAKE 1 TABLET BY MOUTH TWICE DAILY 180 tablet 3  . terbinafine (LAMISIL) 250 MG tablet Take 1 tablet (250 mg total) by mouth daily for 14 days. 14 tablet 0  . triamcinolone cream (KENALOG) 0.5 % Apply 1 application topically 3 (three) times daily. 454 g 1  . warfarin (COUMADIN) 5 MG tablet TAKE 1/2 TABLET BY MOUTH EVERY DAY EXCEPT TAKE 1 TABLET ON MONDAY, WEDNESDAY AND FRIDAY OR AS DIRECTED BY ANTIOAGULATION CLINIC 30 tablet 3   No facility-administered medications prior to visit.    ROS Review of Systems    Constitutional: Negative for chills and fatigue.  HENT: Negative.  Negative for nosebleeds.   Eyes: Negative.   Respiratory: Negative for cough and chest tightness.   Cardiovascular: Negative for leg swelling.  Gastrointestinal: Negative for abdominal pain.  Genitourinary: Negative.  Negative for difficulty urinating.  Musculoskeletal: Negative.   Skin: Positive for rash. Negative for pallor and wound.  Hematological: Negative for adenopathy. Does not bruise/bleed easily.  Psychiatric/Behavioral: Negative.   All other systems reviewed and are negative.   Objective:  BP 136/88 (BP Location: Left Arm, Patient Position: Sitting, Cuff Size: Large)   Pulse 75   Temp 97.9 F (36.6 C) (Oral)   SpO2 97%   BP Readings from Last 3 Encounters:  10/10/19 136/88  10/01/19 104/64  07/01/19 120/72    Wt Readings from Last 3 Encounters:  10/01/19 199 lb 6 oz (90.4 kg)  07/01/19 196 lb (88.9 kg)  06/18/19 194 lb (88 kg)    Physical Exam Skin:    Comments: Sutures removed. The site has healed nicely with scab. There is no erythema, exudate, induration, or fluctuance. The rash on the back has almost completely resolved. See photos.     Lab Results  Component Value Date   WBC 8.3 06/10/2019   HGB 11.9 (L) 06/10/2019   HCT 37.4 (L) 06/10/2019   PLT 262.0 06/10/2019   GLUCOSE 91 06/10/2019   CHOL 148 06/18/2018  TRIG 127.0 06/18/2018   HDL 47.10 06/18/2018   LDLCALC 76 06/18/2018   ALT 11 06/18/2018   AST 12 06/18/2018   NA 142 06/10/2019   K 4.0 06/10/2019   CL 106 06/10/2019   CREATININE 1.58 (H) 06/10/2019   BUN 34 (H) 06/10/2019   CO2 26 06/10/2019   TSH 4.96 (H) 06/10/2019   INR 3.5 (A) 10/10/2019   HGBA1C 5.4 01/20/2015    DG Chest 2 View  Result Date: 12/19/2018 CLINICAL DATA:  Left rib pain following a fall down steps 3 days ago. EXAM: CHEST - 2 VIEW COMPARISON:  01/21/2015. FINDINGS: Stable mildly enlarged cardiac silhouette. Small to moderate-sized hiatal  hernia. Minimal patchy density at the left lung base. Mildly prominent pulmonary vasculature. Left shoulder degenerative changes with superior migration of the humeral head, compatible with a chronic rotator cuff tear. Thoracic spine degenerative changes. No fracture or pneumothorax seen. IMPRESSION: 1. Minimal patchy atelectasis or pneumonia at the left lung base. 2. Stable mild cardiomegaly and mild pulmonary vascular congestion. 3. Small to moderate-sized hiatal hernia. Electronically Signed   By: Claudie Revering M.D.   On: 12/19/2018 11:05   CT Chest Wo Contrast  Result Date: 12/19/2018 CLINICAL DATA:  79 year old male with history of trauma from a fall complaining of chest wall pain. Possible rib fracture. EXAM: CT CHEST WITHOUT CONTRAST TECHNIQUE: Multidetector CT imaging of the chest was performed following the standard protocol without IV contrast. COMPARISON:  No priors. FINDINGS: Cardiovascular: Heart size is mildly enlarged with biatrial dilatation. Trace amount of pericardial fluid and/or thickening, unlikely to be of any hemodynamic significance at this time. No associated pericardial calcification. There is aortic atherosclerosis, as well as atherosclerosis of the great vessels of the mediastinum and the coronary arteries, including calcified atherosclerotic plaque in the left main, left anterior descending, left circumflex and right coronary arteries. Calcifications of the aortic valve. Mild aneurysmal dilatation of the ascending thoracic aorta (4.5 cm in diameter). Mediastinum/Nodes: No pathologically enlarged mediastinal or hilar lymph nodes. Moderate-sized hiatal hernia. No axillary lymphadenopathy. Lungs/Pleura: No pneumothorax. No acute consolidative airspace disease. No pleural effusions. Scattered areas of mild septal thickening most evident throughout the mid to lower lungs bilaterally, particularly in the periphery, potentially indicative of very early fibrosis. Upper Abdomen: Multiple  low-attenuation lesions throughout the visualized liver, spleen and kidneys, incompletely characterized on today's noncontrast CT examination, but statistically likely to represent cysts. The largest of these lesions is in the upper pole of the right kidney measuring 9.7 cm in diameter with peripheral calcifications, potentially a complex cyst, in need of further characterization. Musculoskeletal: Nondisplaced fractures of the anterolateral left third and fourth ribs. Multiple old healed bilateral rib fractures. There are no aggressive appearing lytic or blastic lesions noted in the visualized portions of the skeleton. IMPRESSION: 1. Nondisplaced fractures of the anterolateral aspect of the left third and fourth ribs. No pneumothorax. 2. Cardiomegaly with biatrial dilatation. 3. Moderate hiatal hernia. 4. Aortic atherosclerosis, in addition to left main and 3 vessel coronary artery disease. 5. There are calcifications of the aortic valve. Echocardiographic correlation for evaluation of potential valvular dysfunction may be warranted if clinically indicated. 6. 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 7. 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 Nonemergent outpatient referral to Pulmonology for further evaluation is suggested. Aortic Atherosclerosis (ICD10-I70.0). Electronically Signed   By: Vinnie Langton M.D.   On: 12/19/2018 14:14    Assessment & Plan:   Samy was seen  today for rash.  Diagnoses and all orders for this visit:  Tinea corporis- Marked improvement noted. He is tolerating terbinafine pretty well but his INR is up to 3.5. His Coumadin dose will be adjusted.   I am having Nathan [name] maintain his finasteride, acetaminophen, metoprolol tartrate, diltiazem, allopurinol, levocetirizine, triamcinolone cream, warfarin, furosemide, and terbinafine.  No orders of the defined types were placed in this encounter.    Follow-up: Return  in about 3 months (around 01/10/2020).  Scarlette Calico, MD

## 2019-10-10 NOTE — Patient Instructions (Signed)
Pre visit review using our clinic review tool, if applicable. No additional management support is needed unless otherwise documented below in the visit note.  Hold coumadin tomorrow (6/25) and then change dosage and take 1/2 tablet daily except 1 tablet on Mon and Fridays.  Re-check in 3 weeks.

## 2019-10-10 NOTE — Progress Notes (Signed)
I have reviewed and agree.

## 2019-10-10 NOTE — Patient Instructions (Signed)

## 2019-10-31 ENCOUNTER — Other Ambulatory Visit: Payer: Self-pay

## 2019-10-31 ENCOUNTER — Ambulatory Visit (INDEPENDENT_AMBULATORY_CARE_PROVIDER_SITE_OTHER): Payer: Medicare Other | Admitting: General Practice

## 2019-10-31 DIAGNOSIS — Z7901 Long term (current) use of anticoagulants: Secondary | ICD-10-CM | POA: Diagnosis not present

## 2019-10-31 DIAGNOSIS — I4811 Longstanding persistent atrial fibrillation: Secondary | ICD-10-CM

## 2019-10-31 LAB — POCT INR: INR: 1.8 — AB (ref 2.0–3.0)

## 2019-10-31 NOTE — Progress Notes (Signed)
I have reviewed and agree.

## 2019-10-31 NOTE — Patient Instructions (Addendum)
Pre visit review using our clinic review tool, if applicable. No additional management support is needed unless otherwise documented below in the visit note.  Take 1 tablet today (7/15) and then change dosage and take 1/2 tablet daily except 1 tablet on Mon Wed and Fridays.  Re-check in 3 to 4 weeks.

## 2019-11-11 DIAGNOSIS — N281 Cyst of kidney, acquired: Secondary | ICD-10-CM | POA: Diagnosis not present

## 2019-11-11 DIAGNOSIS — N401 Enlarged prostate with lower urinary tract symptoms: Secondary | ICD-10-CM | POA: Diagnosis not present

## 2019-11-11 DIAGNOSIS — R972 Elevated prostate specific antigen [PSA]: Secondary | ICD-10-CM | POA: Diagnosis not present

## 2019-11-11 DIAGNOSIS — R3914 Feeling of incomplete bladder emptying: Secondary | ICD-10-CM | POA: Diagnosis not present

## 2019-11-28 ENCOUNTER — Ambulatory Visit (INDEPENDENT_AMBULATORY_CARE_PROVIDER_SITE_OTHER): Payer: Medicare Other | Admitting: General Practice

## 2019-11-28 ENCOUNTER — Other Ambulatory Visit: Payer: Self-pay

## 2019-11-28 DIAGNOSIS — Z7901 Long term (current) use of anticoagulants: Secondary | ICD-10-CM | POA: Diagnosis not present

## 2019-11-28 DIAGNOSIS — I4891 Unspecified atrial fibrillation: Secondary | ICD-10-CM | POA: Diagnosis not present

## 2019-11-28 LAB — POCT INR: INR: 3.6 — AB (ref 2.0–3.0)

## 2019-11-28 NOTE — Progress Notes (Signed)
I have reviewed and agree.

## 2019-11-28 NOTE — Patient Instructions (Addendum)
Pre visit review using our clinic review tool, if applicable. No additional management support is needed unless otherwise documented below in the visit note.  Hold coumadin tomorrow (8/13) and then change dosage and take 1/2 tablet daily except take 1 tablet on Monday and Fridays.  Re-check in 3 to 4 weeks.

## 2019-12-16 ENCOUNTER — Ambulatory Visit: Payer: Medicare Other | Admitting: Cardiovascular Disease

## 2019-12-16 ENCOUNTER — Encounter: Payer: Self-pay | Admitting: Cardiovascular Disease

## 2019-12-16 ENCOUNTER — Other Ambulatory Visit: Payer: Self-pay

## 2019-12-16 VITALS — BP 116/66 | HR 52 | Ht 73.0 in | Wt 195.6 lb

## 2019-12-16 DIAGNOSIS — I712 Thoracic aortic aneurysm, without rupture, unspecified: Secondary | ICD-10-CM

## 2019-12-16 DIAGNOSIS — I4811 Longstanding persistent atrial fibrillation: Secondary | ICD-10-CM | POA: Diagnosis not present

## 2019-12-16 MED ORDER — METOPROLOL TARTRATE 50 MG PO TABS
50.0000 mg | ORAL_TABLET | Freq: Two times a day (BID) | ORAL | 3 refills | Status: DC
Start: 2019-12-16 — End: 2020-06-11

## 2019-12-16 NOTE — Progress Notes (Signed)
Cardiology Office Note:    Date:  12/16/2019   ID:  Nathan [name], DOB 1940-08-24, MRN [id number]  PCP:  Janith Lima, MD  Cardiologist: [name]  Electrophysiologist:  None   Referring MD: Janith Lima, MD   Chief Complaint  Patient presents with  . Atrial Fibrillation    March, 2021    Nathan [name] is a 79 y.o. male with a hx of atrial fibrillation, hypertension, gout, hyperlipidemia.  He is seen by Dr. Rayann Heman in the electrophysiology team.  He has been having some problems with leg edema.  We are asked to see him by Dr. Ronnald Ramp for further evaluation of his leg edema.  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  This is being followed by Dr. Roxan Hockey.  Has had swelling in his feet.   Saw his primary MD.   Lasix was increased which has helped  Eats fast foods several times a week  His significant other fixes his meals,   His salt intake has been reduced.     Aug. 30, 2021:  [name] is seen again for follow up of his afib, HTN, HLD, leg eema  Wt. Is 195 lbs  Stays fatigued all the time . He is more fatigued.   Past Medical History:  Diagnosis Date  . Cancer ([place])    skin  . Chronic renal impairment   . Diverticulosis   . DJD (degenerative joint disease)   . Elevated PSA    Dr Junious Silk  . H/O hiatal hernia   . Hypertension   . 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 Permanent atrial fibrillation ([place])   . PONV (postoperative nausea and vomiting)    no  problems recently    Past Surgical History:  Procedure Laterality Date  . COLONOSCOPY  2003  . HERNIA REPAIR    . HIATAL HERNIA REPAIR     Dr Kaylyn Lim , laparoscopic  . INGUINAL HERNIA REPAIR Left 03/05/2013   Procedure: HERNIA REPAIR INGUINAL ADULT;  Surgeon: [name], MD;  Location: [facility];  Service: General;  Laterality: Left;  . INSERTION OF MESH Left 03/05/2013    Procedure: INSERTION OF MESH;  Surgeon: [name], MD;  Location: [facility];  Service: General;  Laterality: Left;  . TOTAL HIP ARTHROPLASTY Right 07  . TOTAL KNEE ARTHROPLASTY Right 07    Current Medications: Current Meds  Medication Sig  . acetaminophen (TYLENOL) 650 MG CR tablet Take 650 mg by mouth every 8 (eight) hours as needed for pain.  Marland Kitchen allopurinol (ZYLOPRIM) 100 MG tablet TAKE 1 TABLET(100 MG) BY MOUTH DAILY  . diltiazem (CARDIZEM CD) 120 MG 24 hr capsule TAKE 1 CAPSULE BY MOUTH EVERY DAY  . finasteride (PROSCAR) 5 MG tablet Take 5 mg by mouth daily. Rx'ed by [name]  . furosemide (LASIX) 40 MG tablet TAKE 1 TABLET(40 MG) BY MOUTH TWICE DAILY  . levocetirizine (XYZAL) 5 MG tablet Take 1 tablet (5 mg total) by mouth every evening.  . triamcinolone cream (KENALOG) 0.5 % Apply 1 application topically 3 (three) times daily.  Marland Kitchen warfarin (COUMADIN) 5 MG tablet TAKE 1/2 TABLET BY MOUTH EVERY DAY EXCEPT TAKE 1 TABLET ON MONDAY, WEDNESDAY AND FRIDAY OR AS DIRECTED BY [facility]  . [DISCONTINUED] metoprolol tartrate (LOPRESSOR) 100 MG tablet TAKE 1 TABLET BY MOUTH TWICE DAILY     Allergies:  Tramadol   Social History   Socioeconomic History  . Marital status: Single    Spouse name: Not on file  . Number of children: 5  . Years of education: Not on file  . Highest education level: Not on file  Occupational History  . Not on file  Tobacco Use  . Smoking status: Former Smoker    Packs/day: 0.25    Years: 3.00    Pack years: 0.75    Types: Cigarettes    Quit date: 04/17/1991    Years since quitting: 28.6  . Smokeless tobacco: Never Used  Vaping Use  . Vaping Use: Never used  Substance and Sexual Activity  . Alcohol use: Yes    Alcohol/week: 2.0 standard drinks    Types: 2 Glasses of wine per week    Comment: occasionally  . Drug use: No  . Sexual activity: Not Currently  Other Topics Concern  . Not on file  Social History Narrative   Retired  Agricultural consultant   Lives with son   Social Determinants of Health   Financial Resource Strain:   . Difficulty of Paying Living Expenses: Not on file  Food Insecurity:   . Worried About Charity fundraiser in the Last Year: Not on file  . Ran Out of Food in the Last Year: Not on file  Transportation Needs:   . Lack of Transportation (Medical): Not on file  . Lack of Transportation (Non-Medical): Not on file  Physical Activity:   . Days of Exercise per Week: Not on file  . Minutes of Exercise per Session: Not on file  Stress:   . Feeling of Stress : Not on file  Social Connections:   . Frequency of Communication with Friends and Family: Not on file  . Frequency of Social Gatherings with Friends and Family: Not on file  . Attends Religious Services: Not on file  . Active Member of Clubs or Organizations: Not on file  . Attends Archivist Meetings: Not on file  . Marital Status: Not on file     Family History: The patient's family history includes Brain cancer in his sister; Diabetes in his brother; Hypertension in his brother and mother; Skin cancer in his mother. There is no history of Stroke or Heart disease.  ROS:   Please see the history of present illness.     All other systems reviewed and are negative.  EKGs/Labs/Other Studies Reviewed:    The following studies were reviewed today: Chest CT scan showing asc. Aorta   EKG:    Recent Labs: 06/10/2019: BUN 34; Creatinine, Ser 1.58; Hemoglobin 11.9; Platelets 262.0; Potassium 4.0; Pro B Natriuretic peptide (BNP) 535.0; Sodium 142; TSH 4.96  Recent Lipid Panel    Component Value Date/Time   CHOL 148 06/18/2018 1620   TRIG 127.0 06/18/2018 1620   HDL 47.10 06/18/2018 1620   CHOLHDL 3 06/18/2018 1620   VLDL 25.4 06/18/2018 1620   LDLCALC 76 06/18/2018 1620    Physical Exam:    Physical Exam: Blood pressure 116/66, pulse (!) 52, height 6\' 1"  (1.854 m), weight 195 lb 9.6 oz (88.7 kg), SpO2 96 %.  GEN:   eldelry  male,  Appears chronically ill  HEENT: Normal NECK: No JVD; No carotid bruits LYMPHATICS: No lymphadenopathy CARDIAC: irreg. Irreg.  RESPIRATORY:  Clear to auscultation without rales, wheezing or rhonchi  ABDOMEN: Soft, non-tender, non-distended MUSCULOSKELETAL:  Mild edema localiced to the left ankle .  No deformity  SKIN: Warm and  dry NEUROLOGIC:  Alert and oriented x 3   ASSESSMENT:    No diagnosis found. PLAN:       Leg edema :      Much better .    2.  Ascending aortic aneurism: Followed by Dr. Roxan Hockey.   He is not a good candidate for aortic repair.   He plans on doing annual screening and does not plan to operate until aneurysm exceeds  5.5 to 6 cm in diameter.  3.  Bradycardia:   Will reduce the dose of his metoprolol to 50 mg PO BID . This may also help with his fatigue   Medication Adjustments/Labs and Tests Ordered: Current medicines are reviewed at length with the patient today.  Concerns regarding medicines are outlined above.  No orders of the defined types were placed in this encounter.  Meds ordered this encounter  Medications  . metoprolol tartrate (LOPRESSOR) 50 MG tablet    Sig: Take 1 tablet (50 mg total) by mouth 2 (two) times daily.    Dispense:  180 tablet    Refill:  3    Patient Instructions  Medication Instructions:  Your physician has recommended you make the following change in your medication:  DECREASE Metoprolol tartrate (Lopressor) to 50 mg twice daily  *If you need a refill on your cardiac medications before your next appointment, please call your pharmacy*   Lab Work: None Ordered If you have labs (blood work) drawn today and your tests are completely normal, you will receive your results only by: Marland Kitchen MyChart Message (if you have MyChart) OR . A paper copy in the mail If you have any lab test that is abnormal or we need to change your treatment, we will call you to review the results.   Testing/Procedures: None Ordered    Follow-Up: At [facility], you and your health needs are our priority.  As part of our continuing mission to provide you with exceptional heart care, we have created designated Provider Care Teams.  These Care Teams include your primary Cardiologist (physician) and Advanced Practice Providers (APPs -  Physician Assistants and Nurse Practitioners) who all work together to provide you with the care you need, when you need it.  We recommend signing up for the patient portal called "MyChart".  Sign up information is provided on this After Visit Summary.  MyChart is used to connect with patients for Virtual Visits (Telemedicine).  Patients are able to view lab/test results, encounter notes, upcoming appointments, etc.  Non-urgent messages can be sent to your provider as well.   To learn more about what you can do with MyChart, go to [email or web address].    Your next appointment:   6 month(s)  The format for your next appointment:   In Person  Provider:   Richardson Dopp, PA-C or Robbie Lis, PA-C        Signed, Mertie Moores, MD  12/16/2019 3:40 PM    [place]

## 2019-12-16 NOTE — Patient Instructions (Addendum)
Medication Instructions:  Your physician has recommended you make the following change in your medication:  DECREASE Metoprolol tartrate (Lopressor) to 50 mg twice daily  *If you need a refill on your cardiac medications before your next appointment, please call your pharmacy*   Lab Work: None Ordered If you have labs (blood work) drawn today and your tests are completely normal, you will receive your results only by: Marland Kitchen MyChart Message (if you have MyChart) OR . A paper copy in the mail If you have any lab test that is abnormal or we need to change your treatment, we will call you to review the results.   Testing/Procedures: None Ordered   Follow-Up: At [facility], you and your health needs are our priority.  As part of our continuing mission to provide you with exceptional heart care, we have created designated Provider Care Teams.  These Care Teams include your primary Cardiologist (physician) and Advanced Practice Providers (APPs -  Physician Assistants and Nurse Practitioners) who all work together to provide you with the care you need, when you need it.  We recommend signing up for the patient portal called "MyChart".  Sign up information is provided on this After Visit Summary.  MyChart is used to connect with patients for Virtual Visits (Telemedicine).  Patients are able to view lab/test results, encounter notes, upcoming appointments, etc.  Non-urgent messages can be sent to your provider as well.   To learn more about what you can do with MyChart, go to [email or web address].    Your next appointment:   6 month(s)  The format for your next appointment:   In Person  Provider:   Richardson Dopp, PA-C or Robbie Lis, PA-C

## 2019-12-19 ENCOUNTER — Other Ambulatory Visit: Payer: Self-pay | Admitting: Internal Medicine

## 2019-12-19 DIAGNOSIS — L2084 Intrinsic (allergic) eczema: Secondary | ICD-10-CM

## 2019-12-23 ENCOUNTER — Other Ambulatory Visit: Payer: Self-pay | Admitting: Internal Medicine

## 2019-12-23 DIAGNOSIS — M1000 Idiopathic gout, unspecified site: Secondary | ICD-10-CM

## 2019-12-26 ENCOUNTER — Other Ambulatory Visit: Payer: Self-pay

## 2019-12-26 ENCOUNTER — Ambulatory Visit (INDEPENDENT_AMBULATORY_CARE_PROVIDER_SITE_OTHER): Payer: Medicare Other | Admitting: General Practice

## 2019-12-26 DIAGNOSIS — I4891 Unspecified atrial fibrillation: Secondary | ICD-10-CM | POA: Diagnosis not present

## 2019-12-26 DIAGNOSIS — Z7901 Long term (current) use of anticoagulants: Secondary | ICD-10-CM | POA: Diagnosis not present

## 2019-12-26 LAB — POCT INR: INR: 2.4 (ref 2.0–3.0)

## 2019-12-26 NOTE — Patient Instructions (Addendum)
Pre visit review using our clinic review tool, if applicable. No additional management support is needed unless otherwise documented below in the visit note.  Continue to take 1/2 tablet daily except take 1 tablet on Monday and Fridays.  Re-check in 6 weeks. 

## 2019-12-27 NOTE — Progress Notes (Signed)
I have reviewed and agree.

## 2020-01-08 ENCOUNTER — Other Ambulatory Visit: Payer: Self-pay | Admitting: Thoracic Surgery (Cardiothoracic Vascular Surgery)

## 2020-01-08 DIAGNOSIS — I712 Thoracic aortic aneurysm, without rupture, unspecified: Secondary | ICD-10-CM

## 2020-02-01 ENCOUNTER — Other Ambulatory Visit: Payer: Self-pay | Admitting: Internal Medicine

## 2020-02-01 DIAGNOSIS — I110 Hypertensive heart disease with heart failure: Secondary | ICD-10-CM

## 2020-02-01 DIAGNOSIS — I10 Essential (primary) hypertension: Secondary | ICD-10-CM

## 2020-02-01 DIAGNOSIS — R600 Localized edema: Secondary | ICD-10-CM

## 2020-02-03 ENCOUNTER — Ambulatory Visit
Admission: RE | Admit: 2020-02-03 | Discharge: 2020-02-03 | Disposition: A | Discharge status: Home / Self Care | Payer: Medicare Other | Source: Ambulatory Visit | Attending: Thoracic Surgery (Cardiothoracic Vascular Surgery) | Admitting: Thoracic Surgery (Cardiothoracic Vascular Surgery)

## 2020-02-03 DIAGNOSIS — I712 Thoracic aortic aneurysm, without rupture, unspecified: Secondary | ICD-10-CM

## 2020-02-03 DIAGNOSIS — S2242XD Multiple fractures of ribs, left side, subsequent encounter for fracture with routine healing: Secondary | ICD-10-CM | POA: Diagnosis not present

## 2020-02-03 DIAGNOSIS — K449 Diaphragmatic hernia without obstruction or gangrene: Secondary | ICD-10-CM | POA: Diagnosis not present

## 2020-02-03 DIAGNOSIS — I700 Atherosclerosis of aorta: Secondary | ICD-10-CM | POA: Diagnosis not present

## 2020-02-05 ENCOUNTER — Other Ambulatory Visit: Payer: Self-pay | Admitting: Internal Medicine

## 2020-02-06 ENCOUNTER — Ambulatory Visit: Payer: Medicare Other

## 2020-02-11 ENCOUNTER — Other Ambulatory Visit: Payer: Self-pay

## 2020-02-11 ENCOUNTER — Encounter: Payer: Self-pay | Admitting: Thoracic Surgery (Cardiothoracic Vascular Surgery)

## 2020-02-11 ENCOUNTER — Ambulatory Visit: Payer: Medicare Other | Admitting: Thoracic Surgery (Cardiothoracic Vascular Surgery)

## 2020-02-11 VITALS — BP 127/76 | HR 54 | Resp 20 | Ht 73.0 in | Wt 195.0 lb

## 2020-02-11 DIAGNOSIS — I712 Thoracic aortic aneurysm, without rupture, unspecified: Secondary | ICD-10-CM

## 2020-02-11 NOTE — Progress Notes (Signed)
[address]       [place] [address]             [phone]     HPI: Nathan [name] returns for follow-up of an ascending aneurysm  Nathan [name] is an 79 year old man with a past history of hypertension, permanent atrial fibrillation, hiatal hernia, degenerative joint disease, chronic renal impairment, rib fractures, coronary calcification on CT and a 4.5 cm ascending aneurysm. He was first noted to have a 4.5 cm aneurysm a year ago.  She is overall he has been feeling well.  He has not been having any chest pain, pressure, or tightness.  He is very hard of hearing.  He ambulates with a walker.  Past Medical History:  Diagnosis Date  . Cancer ([place])    skin  . Chronic renal impairment   . Diverticulosis   . DJD (degenerative joint disease)   . Elevated PSA    Dr Nathan [name]  . H/O hiatal hernia   . Hypertension   . 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 Permanent atrial fibrillation ([place])   . PONV (postoperative nausea and vomiting)    no  problems recently    Current Outpatient Medications  Medication Sig Dispense Refill  . acetaminophen (TYLENOL) 650 MG CR tablet Take 650 mg by mouth every 8 (eight) hours as needed for pain.    Marland Kitchen allopurinol (ZYLOPRIM) 100 MG tablet TAKE 1 TABLET(100 MG) BY MOUTH DAILY 90 tablet 1  . diltiazem (CARDIZEM CD) 120 MG 24 hr capsule TAKE 1 CAPSULE BY MOUTH EVERY DAY 90 capsule 2  . finasteride (PROSCAR) 5 MG tablet Take 5 mg by mouth daily. Rx'ed by Nathan [name]    . furosemide (LASIX) 40 MG tablet TAKE 1 TABLET(40 MG) BY MOUTH TWICE DAILY 180 tablet 1  . levocetirizine (XYZAL) 5 MG tablet TAKE 1 TABLET(5 MG) BY MOUTH EVERY EVENING 90 tablet 1  . metoprolol tartrate (LOPRESSOR) 50 MG tablet Take 1 tablet (50 mg total) by mouth 2 (two) times daily. 180 tablet 3  . warfarin (COUMADIN) 5 MG tablet TAKE 1/2 TABLET BY MOUTH EVERY DAY EXCEPT TAKE 1 TABLET ON MONDAY, WEDNESDAY  AND FRIDAY OR AS DIRECTED BY ANTIOAGULATION CLINIC 30 tablet 3   No current facility-administered medications for this visit.    Physical Exam BP 127/76 (BP Location: Right Arm, Patient Position: Sitting)   Pulse (!) 54   Resp 20   Ht 6\' 1"  (1.854 m)   Wt 195 lb (88.5 kg)   SpO2 95% Comment: RA with mask on  BMI 25.96 kg/m  79 year old man in no acute distress Alert and oriented, hard of hearing No carotid bruits Cardiac irregularly irregular, normal rate, faint systolic murmur Lungs clear with equal breath sounds bilaterally Deformity left ankle, no pretibial edema  Diagnostic Tests: CT CHEST WITHOUT CONTRAST  TECHNIQUE: Multidetector CT imaging of the chest was performed following the standard protocol without IV contrast.  COMPARISON:  12/19/2018  FINDINGS: Cardiovascular: The heart is mildly enlarged but appears stable. No pericardial effusion. There is tortuosity, ectasia and calcification of the thoracic aorta. Stable fusiform aneurysmal dilatation of the ascending thoracic aorta measuring a maximum 44.5 mm.  Stable three-vessel coronary artery calcifications.  Tortuosity and calcification of the major aortic branch vessels. No aneurysm.  Mediastinum/Nodes: Stable scattered mediastinal and hilar lymph nodes but no mass or overt adenopathy. There is a moderate-sized hiatal hernia with surgical  changes noted. Probable slipped Nissen fundoplication.  Lungs/Pleura: No acute pulmonary findings. No worrisome pulmonary lesions or pulmonary nodules. Stable streaky pulmonary scarring changes and peripheral calcifications at the lung bases likely post pneumonic changes. No pleural effusions or pleural lesions.  Upper Abdomen: No significant upper abdominal findings. Stable hepatic cysts, large splenic cyst and large upper pole right renal cyst which has rim like calcifications.  Stable vascular calcifications.  Musculoskeletal: No significant bony  findings. Healed left anterior rib fractures are noted.  IMPRESSION: 1. Stable tortuosity, ectasia and calcification of the thoracic aorta and branch vessels including the coronary arteries. 2. Stable fusiform aneurysmal dilatation of the ascending thoracic aorta measuring a maximum of 44.5 mm. 3. Stable streaky pulmonary scarring changes and peripheral calcifications at the lung bases likely post pneumonic changes. 4. Stable moderate-sized hiatal hernia with surgical changes noted. Probable slipped Nissen fundoplication. 5. Stable hepatic, splenic and right renal cysts. 6. Aortic atherosclerosis.  Aortic Atherosclerosis (ICD10-I70.0).   Electronically Signed   By: Nathan [name] M.D.   On: 02/03/2020 13:42 I personally reviewed the CT chest and concur with the findings noted above  Impression: Nathan [name] is an 79 year old man with a past history of hypertension, permanent atrial fibrillation, hiatal hernia, degenerative joint disease, chronic renal impairment, rib fractures, coronary calcification on CT and a 4.5 cm ascending aneurysm.  Ascending aneurysm/thoracic aortic atherosclerosis-stable at right around 4.4 to 4.5 cm.  Needs continued annual follow-up.  I do not think he would be a candidate for surgical repair, but it will give [place] some information prognostically.  Hiatal hernia-appears unchanged over the past year.  Hypertension-blood pressure well controlled currently.  Persistent atrial fibrillation-rate control, followed by Nathan [name]  Plan: Return in 1 year with CT chest.  We will plan on continuing to do noncontrast scans unless there is a question of a significant increase in size.  Melrose Nakayama, MD [facility] [phone]

## 2020-02-20 ENCOUNTER — Ambulatory Visit (INDEPENDENT_AMBULATORY_CARE_PROVIDER_SITE_OTHER): Payer: Medicare Other | Admitting: General Practice

## 2020-02-20 ENCOUNTER — Other Ambulatory Visit: Payer: Self-pay

## 2020-02-20 DIAGNOSIS — Z7901 Long term (current) use of anticoagulants: Secondary | ICD-10-CM | POA: Diagnosis not present

## 2020-02-20 DIAGNOSIS — I4891 Unspecified atrial fibrillation: Secondary | ICD-10-CM

## 2020-02-20 LAB — POCT INR: INR: 2.4 (ref 2.0–3.0)

## 2020-02-20 NOTE — Patient Instructions (Signed)
Pre visit review using our clinic review tool, if applicable. No additional management support is needed unless otherwise documented below in the visit note.  Continue to take 1/2 tablet daily except take 1 tablet on Monday and Fridays.  Re-check in 6 weeks.

## 2020-02-20 NOTE — Progress Notes (Signed)
I have reviewed and agree.

## 2020-03-01 ENCOUNTER — Other Ambulatory Visit: Payer: Self-pay | Admitting: Internal Medicine

## 2020-03-01 DIAGNOSIS — Z7901 Long term (current) use of anticoagulants: Secondary | ICD-10-CM

## 2020-03-02 ENCOUNTER — Other Ambulatory Visit: Payer: Self-pay | Admitting: Internal Medicine

## 2020-03-02 DIAGNOSIS — Z7901 Long term (current) use of anticoagulants: Secondary | ICD-10-CM

## 2020-03-22 ENCOUNTER — Other Ambulatory Visit: Payer: Self-pay | Admitting: Internal Medicine

## 2020-03-24 ENCOUNTER — Other Ambulatory Visit: Payer: Self-pay | Admitting: Internal Medicine

## 2020-04-02 ENCOUNTER — Ambulatory Visit (INDEPENDENT_AMBULATORY_CARE_PROVIDER_SITE_OTHER): Payer: Medicare Other | Admitting: General Practice

## 2020-04-02 ENCOUNTER — Other Ambulatory Visit: Payer: Self-pay

## 2020-04-02 DIAGNOSIS — I4891 Unspecified atrial fibrillation: Secondary | ICD-10-CM | POA: Diagnosis not present

## 2020-04-02 DIAGNOSIS — Z7901 Long term (current) use of anticoagulants: Secondary | ICD-10-CM

## 2020-04-02 LAB — POCT INR: INR: 2.2 (ref 2.0–3.0)

## 2020-04-02 NOTE — Patient Instructions (Addendum)
Pre visit review using our clinic review tool, if applicable. No additional management support is needed unless otherwise documented below in the visit note.  Continue to take 1/2 tablet daily except take 1 tablet on Monday and Fridays.  Re-check in 6 weeks.

## 2020-04-05 NOTE — Progress Notes (Signed)
I have reviewed and agree.

## 2020-05-05 ENCOUNTER — Encounter: Payer: Self-pay | Admitting: Orthopaedic Surgery

## 2020-05-05 ENCOUNTER — Other Ambulatory Visit: Payer: Self-pay

## 2020-05-05 ENCOUNTER — Ambulatory Visit: Payer: Medicare Other | Admitting: Orthopaedic Surgery

## 2020-05-05 ENCOUNTER — Ambulatory Visit (INDEPENDENT_AMBULATORY_CARE_PROVIDER_SITE_OTHER): Payer: Medicare Other

## 2020-05-05 DIAGNOSIS — M25562 Pain in left knee: Secondary | ICD-10-CM

## 2020-05-05 DIAGNOSIS — M1712 Unilateral primary osteoarthritis, left knee: Secondary | ICD-10-CM | POA: Diagnosis not present

## 2020-05-05 NOTE — Progress Notes (Signed)
Office Visit Note   Patient: Nathan [name]           Date of Birth: 12/15/1940           MRN: [id number] Visit Date: 05/05/2020              Requested by: Janith Lima, MD [address] [place],  [place] [address] PCP: Janith Lima, MD   Assessment & Plan: Visit Diagnoses:  1. Left knee pain, unspecified chronicity   2. Unilateral primary osteoarthritis, left knee     Plan: Nathan [name] is accompanied by his son and here for evaluation of chronic left knee pain.  I saw him many years ago with a diagnosis of osteoarthritis he is presently at the point where he has become almost immobile because of his knee.  Films demonstrate end-stage osteoarthritis.  He does live by himself but family watches him on a daily basis.  They like to proceed with a knee replacement.  After much discussion they still would like to proceed pending clearance from his family physician and cardiologist.  He is on a blood thinner for chronic heart arrhythmias.  He is deconditioned.  Family is on board in terms of what they may expect with a very difficult postoperative course.  He might very well need nursing home placement.  We will have to wait till after the [place] and he is cleared before we consider the surgery. He has had a successful right total knee replacement with revision years ago  Follow-Up Instructions: Return We will proceed with scheduling left total knee replacement.   Orders:  Orders Placed This Encounter  Procedures  . XR KNEE 3 VIEW LEFT   No orders of the defined types were placed in this encounter.     Procedures: No procedures performed   Clinical Data: No additional findings.   Subjective: Chief Complaint  Patient presents with  . Left Knee - Pain  Chronic left knee pain to the point where he is compromised in his activities.  At age 73 he still living by himself but his closely monitored with the family who live nearby.  He has reached a point where he is to having  difficulty maintaining and independence because of his knee pain.  Not had any injuries or trauma.  Does have a chronic cardiac history with arrhythmia and is on a blood thinner.  Has had successful right total knee replacement with a revision years ago  HPI  Review of Systems   Objective: Vital Signs: There were no vitals taken for this visit.  Physical Exam Constitutional:      Appearance: He is well-developed and well-nourished.  HENT:     Mouth/Throat:     Mouth: Oropharynx is clear and moist.  Eyes:     Extraocular Movements: EOM normal.     Pupils: Pupils are equal, round, and reactive to light.  Pulmonary:     Effort: Pulmonary effort is normal.  Skin:    General: Skin is warm and dry.  Neurological:     Mental Status: He is alert and oriented to person, place, and time.  Psychiatric:        Mood and Affect: Mood and affect normal.        Behavior: Behavior normal.     Ortho Exam awake and alert.  Hard of hearing.  Alert and appropriate.  Oriented x3.  Lacked about 20 degrees to full left knee extension.  Some superficial skin skin  abrasions about the knee with a very minimal effusion.  No opening with varus or valgus stress.  Flexed but 103 to 4 degrees without instability.  No calf pain.  Has chronic ankle deformity from an old injury.  The foot was warm.  +1 pulses.  Patient relates he has normal sensation to his foot.  I did not examine his ability to ambulate  Specialty Comments:  No specialty comments available.  Imaging: XR KNEE 3 VIEW LEFT  Result Date: 05/05/2020 Films of the left knee were obtained in several projections standing.  There is diffuse decreased bone density.  There is little if any joint space either medially or laterally with what appears to be normal alignment.  There are considerable osteophytes in both medial lateral compartments and about the patellofemoral joint.  No acute changes.  Films are consistent with advanced, end-stage  osteoarthritis    PMFS History: Patient Active Problem List   Diagnosis Date Noted  . Unilateral primary osteoarthritis, left knee 05/05/2020  . Tinea corporis 10/06/2019  . LVH (left ventricular hypertrophy) due to hypertensive disease, with heart failure ([place]) 06/10/2019  . Thoracic aortic aneurysm, without rupture ([place]) 12/31/2018  . Long term (current) use of anticoagulants 01/06/2017  . Eczema, dyshidrotic 09/07/2016  . Chronic anticoagulation 01/20/2015  . Abnormality of gait due to impairment of balance 10/30/2014  . Hypertriglyceridemia 03/04/2014  . Encounter for therapeutic drug monitoring 05/22/2013  . Vitamin D deficiency 11/16/2012  . Gout 11/16/2012  . Kidney disease, chronic, stage III (GFR 30-59 ml/min) (H[facility]) 03/18/2009  . OSTEOARTHRITIS 03/18/2009  . RENAL CYST 06/14/2007  . Essential hypertension 09/04/2006  . Deficiency anemia 08/31/2006  . Atrial fibrillation ([place]) 08/31/2006   Past Medical History:  Diagnosis Date  . Cancer ([place])    skin  . Chronic renal impairment   . Diverticulosis   . DJD (degenerative joint disease)   . Elevated PSA    Dr Junious Silk  . H/O hiatal hernia   . Hypertension   . 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 Permanent atrial fibrillation ([place])   . PONV (postoperative nausea and vomiting)    no  problems recently    Family History  Problem Relation Age of Onset  . Hypertension Mother   . Skin cancer Mother   . Brain cancer Sister   . Hypertension Brother   . Diabetes Brother   . Stroke Neg Hx   . Heart disease Neg Hx     Past Surgical History:  Procedure Laterality Date  . COLONOSCOPY  2003  . HERNIA REPAIR    . HIATAL HERNIA REPAIR     Dr Kaylyn Lim , laparoscopic  . INGUINAL HERNIA REPAIR Left 03/05/2013   Procedure: HERNIA REPAIR INGUINAL ADULT;  Surgeon: [name], MD;  Location: [facility];  Service: General;  Laterality: Left;  . INSERTION OF MESH  Left 03/05/2013   Procedure: INSERTION OF MESH;  Surgeon: [name], MD;  Location: [facility];  Service: General;  Laterality: Left;  . TOTAL HIP ARTHROPLASTY Right 07  . TOTAL KNEE ARTHROPLASTY Right 07   Social History   Occupational History  . Not on file  Tobacco Use  . Smoking status: Former Smoker    Packs/day: 0.25    Years: 3.00    Pack years: 0.75    Types: Cigarettes    Quit date: 04/17/1991    Years since quitting: 29.0  . Smokeless tobacco:  Never Used  Vaping Use  . Vaping Use: Never used  Substance and Sexual Activity  . Alcohol use: Yes    Alcohol/week: 2.0 standard drinks    Types: 2 Glasses of wine per week    Comment: occasionally  . Drug use: No  . Sexual activity: Not Currently     Garald Balding, MD   Note - This record has been created using [employer].  Chart creation errors have been sought, but may not always  have been located. Such creation errors do not reflect on  the standard of medical care.

## 2020-05-07 ENCOUNTER — Telehealth: Payer: Self-pay | Admitting: *Deleted

## 2020-05-07 NOTE — Telephone Encounter (Signed)
   [facility] Pre-operative Risk Assessment    HEARTCARE STAFF: - Please ensure there is not already an duplicate clearance open for this procedure. - Under Visit Info/Reason for Call, type in Other and utilize the format Clearance MM/DD/YY or Clearance TBD. Do not use dashes or single digits. - If request is for dental extraction, please clarify the # of teeth to be extracted.  Request for surgical clearance:  1. What type of surgery is being performed? LEFT KNEE REPLACEMENT   2. When is this surgery scheduled? TBD   3. What type of clearance is required (medical clearance vs. Pharmacy clearance to hold med vs. Both)? BOTH  4. Are there any medications that need to be held prior to surgery and how long? WARFARIN   5. Practice name and name of physician performing surgery? [facility]; DR. Collier Salina [facility]   6. What is the office phone number? [phone]   7.   What is the office fax number? [phone]  8.   Anesthesia type (None, local, MAC, general) ? NOT LISTED ( CHOICE ?)   Julaine Hua 05/07/2020, 8:15 AM  _________________________________________________________________   (provider comments below)

## 2020-05-07 NOTE — Telephone Encounter (Signed)
Patient with diagnosis of atrial fibrillation on warfarin for anticoagulation.    Procedure: left knee replacement Date of procedure: TBD    CHA2DS2-VASc Score = 3  This indicates a 3.2% annual risk of stroke. The patient's score is based upon: CHF History: No HTN History: Yes Diabetes History: No Stroke History: No Vascular Disease History: No Age Score: 2 Gender Score: 0   CrCl 45.1 Platelet count 202  Per office protocol, patient can hold warfarin for 5 days prior to procedure.    Patient will not need bridging with Lovenox (enoxaparin) around procedure.

## 2020-05-08 NOTE — Telephone Encounter (Signed)
Voicemail box not set up yet.  Unable to leave message 05/08/2020 at 8:19 AM.  Needs call back

## 2020-05-10 ENCOUNTER — Emergency Department (HOSPITAL_COMMUNITY): Payer: Medicare Other

## 2020-05-10 ENCOUNTER — Other Ambulatory Visit: Payer: Self-pay

## 2020-05-10 ENCOUNTER — Inpatient Hospital Stay (HOSPITAL_COMMUNITY)
Admission: EM | Admit: 2020-05-10 | Discharge: 2020-05-15 | DRG: 493 | Disposition: A | Discharge status: Skilled Nursing Facility | Payer: Medicare Other | Attending: Internal Medicine | Admitting: Internal Medicine

## 2020-05-10 ENCOUNTER — Encounter (HOSPITAL_COMMUNITY): Payer: Self-pay | Admitting: Emergency Medicine

## 2020-05-10 ENCOUNTER — Telehealth: Payer: Self-pay | Admitting: Orthopaedic Surgery

## 2020-05-10 DIAGNOSIS — R52 Pain, unspecified: Secondary | ICD-10-CM | POA: Diagnosis not present

## 2020-05-10 DIAGNOSIS — Z8249 Family history of ischemic heart disease and other diseases of the circulatory system: Secondary | ICD-10-CM

## 2020-05-10 DIAGNOSIS — I10 Essential (primary) hypertension: Secondary | ICD-10-CM | POA: Diagnosis present

## 2020-05-10 DIAGNOSIS — Z419 Encounter for procedure for purposes other than remedying health state, unspecified: Secondary | ICD-10-CM

## 2020-05-10 DIAGNOSIS — I4891 Unspecified atrial fibrillation: Secondary | ICD-10-CM | POA: Diagnosis present

## 2020-05-10 DIAGNOSIS — G8918 Other acute postprocedural pain: Secondary | ICD-10-CM | POA: Diagnosis not present

## 2020-05-10 DIAGNOSIS — M25511 Pain in right shoulder: Secondary | ICD-10-CM | POA: Diagnosis not present

## 2020-05-10 DIAGNOSIS — N179 Acute kidney failure, unspecified: Secondary | ICD-10-CM | POA: Diagnosis present

## 2020-05-10 DIAGNOSIS — I482 Chronic atrial fibrillation, unspecified: Secondary | ICD-10-CM | POA: Diagnosis not present

## 2020-05-10 DIAGNOSIS — Y92009 Unspecified place in unspecified non-institutional (private) residence as the place of occurrence of the external cause: Secondary | ICD-10-CM | POA: Diagnosis not present

## 2020-05-10 DIAGNOSIS — S42253A Displaced fracture of greater tuberosity of unspecified humerus, initial encounter for closed fracture: Secondary | ICD-10-CM | POA: Diagnosis present

## 2020-05-10 DIAGNOSIS — Z20822 Contact with and (suspected) exposure to covid-19: Secondary | ICD-10-CM | POA: Diagnosis not present

## 2020-05-10 DIAGNOSIS — R269 Unspecified abnormalities of gait and mobility: Secondary | ICD-10-CM | POA: Diagnosis not present

## 2020-05-10 DIAGNOSIS — Z7901 Long term (current) use of anticoagulants: Secondary | ICD-10-CM | POA: Diagnosis not present

## 2020-05-10 DIAGNOSIS — W19XXXA Unspecified fall, initial encounter: Secondary | ICD-10-CM

## 2020-05-10 DIAGNOSIS — Z6829 Body mass index (BMI) 29.0-29.9, adult: Secondary | ICD-10-CM | POA: Diagnosis not present

## 2020-05-10 DIAGNOSIS — W1830XA Fall on same level, unspecified, initial encounter: Secondary | ICD-10-CM | POA: Diagnosis present

## 2020-05-10 DIAGNOSIS — S42351A Displaced comminuted fracture of shaft of humerus, right arm, initial encounter for closed fracture: Secondary | ICD-10-CM | POA: Diagnosis not present

## 2020-05-10 DIAGNOSIS — Z79899 Other long term (current) drug therapy: Secondary | ICD-10-CM

## 2020-05-10 DIAGNOSIS — S42291A Other displaced fracture of upper end of right humerus, initial encounter for closed fracture: Secondary | ICD-10-CM | POA: Diagnosis not present

## 2020-05-10 DIAGNOSIS — S46011A Strain of muscle(s) and tendon(s) of the rotator cuff of right shoulder, initial encounter: Secondary | ICD-10-CM | POA: Diagnosis not present

## 2020-05-10 DIAGNOSIS — S42211A Unspecified displaced fracture of surgical neck of right humerus, initial encounter for closed fracture: Secondary | ICD-10-CM

## 2020-05-10 DIAGNOSIS — S42401A Unspecified fracture of lower end of right humerus, initial encounter for closed fracture: Secondary | ICD-10-CM | POA: Diagnosis not present

## 2020-05-10 DIAGNOSIS — I517 Cardiomegaly: Secondary | ICD-10-CM | POA: Diagnosis not present

## 2020-05-10 DIAGNOSIS — E441 Mild protein-calorie malnutrition: Secondary | ICD-10-CM | POA: Diagnosis present

## 2020-05-10 DIAGNOSIS — S42294A Other nondisplaced fracture of upper end of right humerus, initial encounter for closed fracture: Secondary | ICD-10-CM | POA: Diagnosis not present

## 2020-05-10 DIAGNOSIS — M25519 Pain in unspecified shoulder: Secondary | ICD-10-CM | POA: Diagnosis not present

## 2020-05-10 DIAGNOSIS — S42251A Displaced fracture of greater tuberosity of right humerus, initial encounter for closed fracture: Secondary | ICD-10-CM | POA: Diagnosis not present

## 2020-05-10 DIAGNOSIS — R7989 Other specified abnormal findings of blood chemistry: Secondary | ICD-10-CM | POA: Diagnosis present

## 2020-05-10 HISTORY — DX: Essential (primary) hypertension: I10

## 2020-05-10 LAB — CBC WITH DIFFERENTIAL/PLATELET
Abs Immature Granulocytes: 0.07 10*3/uL (ref 0.00–0.07)
Basophils Absolute: 0 10*3/uL (ref 0.0–0.1)
Basophils Relative: 0 %
Eosinophils Absolute: 0.1 10*3/uL (ref 0.0–0.5)
Eosinophils Relative: 1 %
HCT: 37.5 % — ABNORMAL LOW (ref 39.0–52.0)
Hemoglobin: 12 g/dL — ABNORMAL LOW (ref 13.0–17.0)
Immature Granulocytes: 1 %
Lymphocytes Relative: 5 %
Lymphs Abs: 0.5 10*3/uL — ABNORMAL LOW (ref 0.7–4.0)
MCH: 31.3 pg (ref 26.0–34.0)
MCHC: 32 g/dL (ref 30.0–36.0)
MCV: 97.9 fL (ref 80.0–100.0)
Monocytes Absolute: 0.9 10*3/uL (ref 0.1–1.0)
Monocytes Relative: 10 %
Neutro Abs: 8 10*3/uL — ABNORMAL HIGH (ref 1.7–7.7)
Neutrophils Relative %: 83 %
Platelets: 244 10*3/uL (ref 150–400)
RBC: 3.83 MIL/uL — ABNORMAL LOW (ref 4.22–5.81)
RDW: 15.4 % (ref 11.5–15.5)
WBC: 9.5 10*3/uL (ref 4.0–10.5)
nRBC: 0 % (ref 0.0–0.2)

## 2020-05-10 LAB — COMPREHENSIVE METABOLIC PANEL
ALT: 18 U/L (ref 0–44)
AST: 25 U/L (ref 15–41)
Albumin: 3.3 g/dL — ABNORMAL LOW (ref 3.5–5.0)
Alkaline Phosphatase: 63 U/L (ref 38–126)
Anion gap: 11 (ref 5–15)
BUN: 45 mg/dL — ABNORMAL HIGH (ref 8–23)
CO2: 25 mmol/L (ref 22–32)
Calcium: 9.2 mg/dL (ref 8.9–10.3)
Chloride: 105 mmol/L (ref 98–111)
Creatinine, Ser: 2.01 mg/dL — ABNORMAL HIGH (ref 0.61–1.24)
GFR, Estimated: 33 mL/min — ABNORMAL LOW (ref >60)
Glucose, Bld: 129 mg/dL — ABNORMAL HIGH (ref 70–99)
Potassium: 4.6 mmol/L (ref 3.5–5.1)
Sodium: 141 mmol/L (ref 135–145)
Total Bilirubin: 0.9 mg/dL (ref 0.3–1.2)
Total Protein: 6.6 g/dL (ref 6.5–8.1)

## 2020-05-10 LAB — PROTIME-INR
INR: 2.9 — ABNORMAL HIGH (ref 0.8–1.2)
Prothrombin Time: 29.6 seconds — ABNORMAL HIGH (ref 11.4–15.2)

## 2020-05-10 LAB — SARS CORONAVIRUS 2 BY RT PCR (HOSPITAL ORDER, PERFORMED IN ~~LOC~~ HOSPITAL LAB): SARS Coronavirus 2: NEGATIVE

## 2020-05-10 MED ORDER — OXYCODONE HCL 5 MG PO TABS
5.0000 mg | ORAL_TABLET | ORAL | Status: DC | PRN
  Administered 2020-05-10 – 2020-05-14 (×8): 5 mg via ORAL
  Filled 2020-05-10 (×7): qty 1

## 2020-05-10 MED ORDER — ACETAMINOPHEN 325 MG PO TABS
650.0000 mg | ORAL_TABLET | Freq: Four times a day (QID) | ORAL | Status: DC | PRN
  Administered 2020-05-10 – 2020-05-15 (×6): 650 mg via ORAL
  Filled 2020-05-10 (×6): qty 2

## 2020-05-10 MED ORDER — METOPROLOL TARTRATE 50 MG PO TABS
50.0000 mg | ORAL_TABLET | Freq: Two times a day (BID) | ORAL | Status: DC
  Administered 2020-05-10 – 2020-05-11 (×3): 50 mg via ORAL
  Filled 2020-05-10: qty 1
  Filled 2020-05-10: qty 2
  Filled 2020-05-10: qty 1

## 2020-05-10 MED ORDER — OXYCODONE-ACETAMINOPHEN 5-325 MG PO TABS
2.0000 | ORAL_TABLET | Freq: Once | ORAL | Status: AC
  Administered 2020-05-10: 2 via ORAL
  Filled 2020-05-10: qty 2

## 2020-05-10 MED ORDER — MORPHINE SULFATE (PF) 2 MG/ML IV SOLN: 2.0000 mg | INTRAVENOUS | Status: DC | PRN

## 2020-05-10 MED ORDER — DILTIAZEM HCL ER COATED BEADS 120 MG PO CP24
120.0000 mg | ORAL_CAPSULE | Freq: Every day | ORAL | Status: DC
  Administered 2020-05-10 – 2020-05-15 (×6): 120 mg via ORAL
  Filled 2020-05-10 (×6): qty 1

## 2020-05-10 MED ORDER — LISINOPRIL 20 MG PO TABS
40.0000 mg | ORAL_TABLET | Freq: Every day | ORAL | Status: DC
  Administered 2020-05-10 – 2020-05-15 (×5): 40 mg via ORAL
  Filled 2020-05-10 (×5): qty 2

## 2020-05-10 MED ORDER — FENTANYL CITRATE (PF) 100 MCG/2ML IJ SOLN
50.0000 ug | Freq: Once | INTRAMUSCULAR | Status: AC
  Administered 2020-05-10: 50 ug via INTRAVENOUS
  Filled 2020-05-10: qty 2

## 2020-05-10 MED ORDER — POLYETHYLENE GLYCOL 3350 17 G PO PACK: 17.0000 g | PACK | Freq: Every day | ORAL | Status: DC | PRN

## 2020-05-10 MED ORDER — FINASTERIDE 5 MG PO TABS
5.0000 mg | ORAL_TABLET | Freq: Every day | ORAL | Status: DC
  Administered 2020-05-10 – 2020-05-15 (×6): 5 mg via ORAL
  Filled 2020-05-10 (×6): qty 1

## 2020-05-10 MED ORDER — ACETAMINOPHEN 650 MG RE SUPP: 650.0000 mg | Freq: Four times a day (QID) | RECTAL | Status: DC | PRN

## 2020-05-10 MED ORDER — ONDANSETRON HCL 4 MG PO TABS: 4.0000 mg | ORAL_TABLET | Freq: Four times a day (QID) | ORAL | Status: DC | PRN

## 2020-05-10 MED ORDER — ONDANSETRON HCL 4 MG/2ML IJ SOLN
4.0000 mg | Freq: Four times a day (QID) | INTRAMUSCULAR | Status: DC | PRN
Start: 2020-05-10 — End: 2020-05-12

## 2020-05-10 NOTE — Progress Notes (Signed)
Nathan [name]  is a 80 y.o. male, with history of atrial fibrillation, hypertension, presents to the ED with a chief complaint of fall.  Patient reports that he was walking down his walker got going too fast hit the brakes on his walker and went over the top of the walker and landed on his right shoulder.  He immediately felt pain and knew something was wrong.  He called his son who called EMS to bring him into the ER.  Patient reports that before he fell he had no preceding symptoms including no chest pain, no shortness of breath, no palpitations, no dizziness.  Patient reports that he did not hit his head.  He has had no change in vision, no change in hearing, no headache, no neck ache.  Patient is on anticoagulation, INR 2.9.  He reports that he has been compliant with his warfarin and that his PCP was going to check his INR again in a couple of weeks.  Patient reports he is otherwise been in his normal state of health, and has no complaints.  Patient is walker dependent and lives alone.  Patient does not smoke, he occasionally drinks a glass of wine, he does not use illicit drugs.  Patient is full code, he is vaccinated for COVID.  05/10/20:  Seen and examined at his bedside in the ED.  R shoulder pain improved after pain medication.  Contacted Dr. Lynann Bologna, ortho for plan from surgical standpoint.  INR 2.9, will likely need to be less than 2.0 prior to surgical intervention.  Pharmacy consulted to assist.  Please refer to H&P dictated by my partner Dr. Clearence Ped on 05/10/20 for further details of the assessment and plan.

## 2020-05-10 NOTE — ED Notes (Signed)
[facility] has been notified.

## 2020-05-10 NOTE — Progress Notes (Signed)
Orthopedic Tech Progress Note Patient Details:  Nathan [name] 11/06/1940 [id number]  Ortho Devices Type of Ortho Device: Sling immobilizer Ortho Device/Splint Location: rue Ortho Device/Splint Interventions: Ordered,Application,Adjustment   Post Interventions Patient Tolerated: Well Instructions Provided: Care of device,Adjustment of device   Karolee Stamps 05/10/2020, 6:29 AM

## 2020-05-10 NOTE — Consult Note (Signed)
ORTHOPAEDIC CONSULTATION  REQUESTING PHYSICIAN: Kayleen Memos, DO  Chief Complaint: right humerus fracture   HPI: Nathan [name] is a 80 y.o. male who complains of  Right shoulder pain after fall.  Imaging shows Displaced and overriding humeral neck fracture.  Orthopedics was consulted for evaluation.     Past Medical History:  Diagnosis Date  . Hypertension    History reviewed. No pertinent surgical history. Social History   Socioeconomic History  . Marital status: Unknown    Spouse name: Not on file  . Number of children: Not on file  . Years of education: Not on file  . Highest education level: Not on file  Occupational History  . Not on file  Tobacco Use  . Smoking status: Never Smoker  . Smokeless tobacco: Never Used  Substance and Sexual Activity  . Alcohol use: Never  . Drug use: Never  . Sexual activity: Not on file  Other Topics Concern  . Not on file  Social History Narrative  . Not on file   Social Determinants of Health   Financial Resource Strain: Not on file  Food Insecurity: Not on file  Transportation Needs: Not on file  Physical Activity: Not on file  Stress: Not on file  Social Connections: Not on file   No family history on file. No Known Allergies Prior to Admission medications   Medication Sig Start Date End Date Taking? Authorizing Provider  acetaminophen (TYLENOL) 325 MG tablet Take 975 mg by mouth daily.   Yes [provider]  allopurinol (ZYLOPRIM) 100 MG tablet Take 100 mg by mouth daily. 03/24/20  Yes [provider]  diltiazem (CARDIZEM CD) 120 MG 24 hr capsule Take 120 mg by mouth daily. 05/06/20  Yes [provider]  finasteride (PROSCAR) 5 MG tablet Take 5 mg by mouth daily. 03/04/20  Yes [provider]  furosemide (LASIX) 40 MG tablet Take 40 mg by mouth 2 (two) times daily. 04/30/20  Yes [provider]  levocetirizine (XYZAL) 5 MG tablet Take 5 mg by mouth at bedtime as  needed for allergies. 03/16/20  Yes [provider]  lisinopril (ZESTRIL) 40 MG tablet Take 40 mg by mouth daily. 12/24/19  Yes [provider]  metoprolol tartrate (LOPRESSOR) 50 MG tablet Take 50 mg by mouth 2 (two) times daily. 03/13/20  Yes [provider]  warfarin (COUMADIN) 5 MG tablet Take 5 mg by mouth daily. 04/07/20  Yes [provider]   CT Shoulder Right Wo Contrast  Result Date: 05/10/2020 CLINICAL DATA:  Right humeral fracture EXAM: CT OF THE UPPER RIGHT EXTREMITY WITHOUT CONTRAST TECHNIQUE: Multidetector CT imaging of the upper right extremity was performed according to the standard protocol. COMPARISON:  None. FINDINGS: Bones/Joint/Cartilage There is a comminuted fracture of the proximal humerus with both transverse and oblique fractures involving the surgical neck of the humerus and proximal diaphysis of the humerus, respectively. The humeral shaft is displaced posterior to the humeral head by 1 shaft with, displaced medial to the humeral head by 1/2 shaft with, demonstrates roughly 3 cm override, and demonstrates roughly 40 degrees anterior and 30 degrees lateral angulation. A minimally displaced fracture is also seen involving the greater tuberosity of the humerus with fracture fragments in anatomic alignment. The articular surface of the humeral head is still seated within the glenoid fossa. The visualized clavicle and the scapula are intact. The acromioclavicular joint space is preserved. The visualized right thoracic cage is intact. Ligaments Suboptimally assessed  by CT. Muscles and Tendons Unremarkable Soft tissues Small left shoulder effusion present. Infiltrative change within the a soft tissues surrounding the proximal humerus and inferior to the right shoulder likely represents small amount of interstitial hemorrhage and/or edema. IMPRESSION: Comminuted fracture of the proximal humeral metadiaphysis with displacement, angulation, and override as  described above. Associated minimally displaced anatomically aligned greater tuberosity fracture. Electronically Signed   By: Fidela Salisbury MD   On: 05/10/2020 05:20   DG Chest Portable 1 View  Result Date: 05/10/2020 CLINICAL DATA:  Fall EXAM: PORTABLE CHEST 1 VIEW COMPARISON:  Chest CT 02/03/2020 FINDINGS: Cardiomegaly and aortic tortuosity. Vascular fullness at the hila. There is no edema, consolidation, effusion, or pneumothorax. Displaced proximal humerus fracture. IMPRESSION: 1. Right proximal humerus fracture. 2. Chronic cardiomegaly. Electronically Signed   By: Monte Fantasia M.D.   On: 05/10/2020 04:15   DG Shoulder Right Portable  Result Date: 05/10/2020 CLINICAL DATA:  Fall. EXAM: PORTABLE RIGHT SHOULDER COMPARISON:  None. FINDINGS: Humeral neck fracture with over 100% displacement and fracture overlapping. There may be continuation along the greater tuberosity, but no measurable displacement at this level on the submitted views. Generalized osteopenia. IMPRESSION: Displaced and overriding humeral neck fracture. Electronically Signed   By: Monte Fantasia M.D.   On: 05/10/2020 04:17    Positive ROS: All other systems have been reviewed and were otherwise negative with the exception of those mentioned in the HPI and as above.  Objective: Labs cbc Recent Labs    05/10/20 0350  WBC 9.5  HGB 12.0*  HCT 37.5*  PLT 244    Labs inflam No results for input(s): CRP in the last 72 hours.  Invalid input(s): ESR  Labs coag Recent Labs    05/10/20 0350  INR 2.9*    Recent Labs    05/10/20 0350  NA 141  K 4.6  CL 105  CO2 25  GLUCOSE 129*  BUN 45*  CREATININE 2.01*  CALCIUM 9.2    Physical Exam: Vitals:   05/10/20 0910 05/10/20 1000  BP: 105/72 127/84  Pulse: 93 86  Resp:  11  Temp:    SpO2:  94%   General: Alert, no acute distress.  Confused at time of exam.  Mental status: Alert and Oriented x3 Neurologic: no gross focal findings or movement disorder  appreciated. Respiratory: No cyanosis, no use of accessory musculature Cardiovascular: No pedal edema GI: Abdomen is soft and non-tender, non-distended. Skin: Warm and dry.  No lesions in the area of chief complaint . Extremities: Warm and well perfused w/o edema Psychiatric: Patient is competent for consent with normal mood and affect  MUSCULOSKELETAL:  Right shoulder without lesions, no obvious deformity noted. TTP over the right shoulder.  Other extremities are atraumatic with painless ROM and NVI.  Assessment / Plan: Principal Problem:   Comminuted fracture of right humerus Active Problems:   Fall at home, initial encounter   A-fib ([place])   Elevated serum creatinine    Weightbearing: NWB RUE Orthopedic device(s): sling to RUE at all times.  VTE prophylaxis: pt on coumadin. Hold coumadin and recommend heparin, hold all anticoagulation Tuesday monring. Pain control: oxycodone, tylenol, try and minimize narcotic use.  Will plan for surgical fixation Tuesday at [facility]. Pt. Will need transfer to [facility] for surgery. Call out to hospitalitis to facilitate this. awaiting call back.  Contact information:  Edmonia Lynch MD, Margy Clarks PA-C  Rachael Fee PA-C 05/10/2020 11:17 AM

## 2020-05-10 NOTE — ED Provider Notes (Signed)
[facility] EMERGENCY DEPARTMENT Provider Note   CSN: [id number] Arrival date & time: 05/10/20  0347     History Chief Complaint  Patient presents with  . Fall  Coumadin    Level2    Nathan [name] is a 80 y.o. male.  Walking with walker down the hall, got going to fast and fell down. May have hit his head a little bit but did not lose consciousness. Does have right shoulder pain however.    Fall This is a new problem. The current episode started 1 to 2 hours ago. The problem occurs constantly. The problem has not changed since onset.Associated symptoms comments: Right shoulder pain. Nothing aggravates the symptoms. Nothing relieves the symptoms. He has tried nothing for the symptoms. The treatment provided no relief.       Past Medical History:  Diagnosis Date  . Hypertension     Patient Active Problem List   Diagnosis Date Noted  . Comminuted fracture of right humerus 05/10/2020  . Fall at home, initial encounter 05/10/2020  . A-fib ([place]) 05/10/2020  . Elevated serum creatinine 05/10/2020    History reviewed. No pertinent surgical history.     History reviewed. No pertinent family history.  Social History   Tobacco Use  . Smoking status: Never Smoker  . Smokeless tobacco: Never Used  Substance Use Topics  . Alcohol use: Never  . Drug use: Never    Home Medications Prior to Admission medications   Medication Sig Start Date End Date Taking? Authorizing Provider  acetaminophen (TYLENOL) 325 MG tablet Take 975 mg by mouth daily.   Yes [provider]  allopurinol (ZYLOPRIM) 100 MG tablet Take 100 mg by mouth daily. 03/24/20  Yes [provider]  diltiazem (CARDIZEM CD) 120 MG 24 hr capsule Take 120 mg by mouth daily. 05/06/20  Yes [provider]  finasteride (PROSCAR) 5 MG tablet Take 5 mg by mouth daily. 03/04/20  Yes [provider]  furosemide (LASIX) 40 MG tablet Take 40 mg by mouth 2 (two) times  daily. 04/30/20  Yes [provider]  levocetirizine (XYZAL) 5 MG tablet Take 5 mg by mouth at bedtime as needed for allergies. 03/16/20  Yes [provider]  lisinopril (ZESTRIL) 40 MG tablet Take 40 mg by mouth daily. 12/24/19  Yes [provider]  metoprolol tartrate (LOPRESSOR) 50 MG tablet Take 50 mg by mouth 2 (two) times daily. 03/13/20  Yes [provider]  warfarin (COUMADIN) 5 MG tablet Take 5 mg by mouth daily. 04/07/20  Yes [provider]    Allergies    Patient has no known allergies.  Review of Systems   Review of Systems  All other systems reviewed and are negative.   Physical Exam Updated Vital Signs BP (!) 134/95 (BP Location: Left Arm)   Pulse 86   Temp 98.9 F (37.2 C) (Oral)   Resp 18   Ht 5\' 9"  (1.753 m)   Wt 92 kg   SpO2 97%   BMI 29.95 kg/m   Physical Exam Vitals and nursing note reviewed.  Constitutional:      Appearance: He is well-developed and well-nourished.  HENT:     Head: Normocephalic and atraumatic.     Nose: No congestion or rhinorrhea.     Mouth/Throat:     Mouth: Mucous membranes are moist.     Pharynx: Oropharynx is clear.  Eyes:     Pupils: Pupils are equal, round, and reactive to light.  Cardiovascular:     Rate and Rhythm: Normal rate.  Pulmonary:     Effort: Pulmonary effort is normal. No respiratory distress.  Abdominal:     General: There is no distension.  Musculoskeletal:        General: Tenderness (right shoulder. no c/t/l spine ttp, no chest ttp, no LE ttp, no LUE ttp, no pelvic ttp) and deformity (right shoulder) present. Normal range of motion.     Cervical back: Normal range of motion.  Skin:    General: Skin is warm and dry.     Coloration: Skin is not jaundiced or pale.  Neurological:     General: No focal deficit present.     Mental Status: He is alert.     ED Results / Procedures / Treatments   Labs (all labs ordered are listed, but only abnormal results are  displayed) Labs Reviewed  CBC WITH DIFFERENTIAL/PLATELET - Abnormal; Notable for the following components:      Result Value   RBC 3.83 (*)    Hemoglobin 12.0 (*)    HCT 37.5 (*)    Neutro Abs 8.0 (*)    Lymphs Abs 0.5 (*)    All other components within normal limits  COMPREHENSIVE METABOLIC PANEL - Abnormal; Notable for the following components:   Glucose, Bld 129 (*)    BUN 45 (*)    Creatinine, Ser 2.01 (*)    Albumin 3.3 (*)    GFR, Estimated 33 (*)    All other components within normal limits  PROTIME-INR - Abnormal; Notable for the following components:   Prothrombin Time 29.6 (*)    INR 2.9 (*)    All other components within normal limits  COMPREHENSIVE METABOLIC PANEL - Abnormal; Notable for the following components:   BUN 42 (*)    Creatinine, Ser 1.86 (*)    Calcium 8.4 (*)    Total Protein 5.5 (*)    Albumin 2.8 (*)    GFR, Estimated 36 (*)    All other components within normal limits  CBC WITH DIFFERENTIAL/PLATELET - Abnormal; Notable for the following components:   RBC 3.35 (*)    Hemoglobin 10.2 (*)    HCT 32.5 (*)    RDW 15.7 (*)    All other components within normal limits  PROTIME-INR - Abnormal; Notable for the following components:   Prothrombin Time 24.7 (*)    INR 2.3 (*)    All other components within normal limits  SARS CORONAVIRUS 2 BY RT PCR (HOSPITAL ORDER, [facility])  MAGNESIUM  URINALYSIS, ROUTINE W REFLEX MICROSCOPIC    EKG None  Radiology CT Shoulder Right Wo Contrast  Result Date: 05/10/2020 CLINICAL DATA:  Right humeral fracture EXAM: CT OF THE UPPER RIGHT EXTREMITY WITHOUT CONTRAST TECHNIQUE: Multidetector CT imaging of the upper right extremity was performed according to the standard protocol. COMPARISON:  None. FINDINGS: Bones/Joint/Cartilage There is a comminuted fracture of the proximal humerus with both transverse and oblique fractures involving the surgical neck of the humerus and proximal diaphysis  of the humerus, respectively. The humeral shaft is displaced posterior to the humeral head by 1 shaft with, displaced medial to the humeral head by 1/2 shaft with, demonstrates roughly 3 cm override, and demonstrates roughly 40 degrees anterior and 30 degrees lateral angulation. A minimally displaced fracture is also seen involving the greater tuberosity of the humerus with fracture fragments in anatomic alignment. The articular surface of the humeral head is still seated within the  glenoid fossa. The visualized clavicle and the scapula are intact. The acromioclavicular joint space is preserved. The visualized right thoracic cage is intact. Ligaments Suboptimally assessed by CT. Muscles and Tendons Unremarkable Soft tissues Small left shoulder effusion present. Infiltrative change within the a soft tissues surrounding the proximal humerus and inferior to the right shoulder likely represents small amount of interstitial hemorrhage and/or edema. IMPRESSION: Comminuted fracture of the proximal humeral metadiaphysis with displacement, angulation, and override as described above. Associated minimally displaced anatomically aligned greater tuberosity fracture. Electronically Signed   By: Fidela Salisbury MD   On: 05/10/2020 05:20   DG Chest Portable 1 View  Result Date: 05/10/2020 CLINICAL DATA:  Fall EXAM: PORTABLE CHEST 1 VIEW COMPARISON:  Chest CT 02/03/2020 FINDINGS: Cardiomegaly and aortic tortuosity. Vascular fullness at the hila. There is no edema, consolidation, effusion, or pneumothorax. Displaced proximal humerus fracture. IMPRESSION: 1. Right proximal humerus fracture. 2. Chronic cardiomegaly. Electronically Signed   By: Monte Fantasia M.D.   On: 05/10/2020 04:15   DG Shoulder Right Portable  Result Date: 05/10/2020 CLINICAL DATA:  Fall. EXAM: PORTABLE RIGHT SHOULDER COMPARISON:  None. FINDINGS: Humeral neck fracture with over 100% displacement and fracture overlapping. There may be continuation along  the greater tuberosity, but no measurable displacement at this level on the submitted views. Generalized osteopenia. IMPRESSION: Displaced and overriding humeral neck fracture. Electronically Signed   By: Monte Fantasia M.D.   On: 05/10/2020 04:17    Procedures Procedures (including critical care time)  Medications Ordered in ED Medications  diltiazem (CARDIZEM CD) 24 hr capsule 120 mg (120 mg Oral Given 05/10/20 0911)  lisinopril (ZESTRIL) tablet 40 mg (40 mg Oral Given 05/10/20 0911)  metoprolol tartrate (LOPRESSOR) tablet 50 mg (50 mg Oral Given 05/10/20 2236)  finasteride (PROSCAR) tablet 5 mg (5 mg Oral Given 05/10/20 0911)  acetaminophen (TYLENOL) tablet 650 mg (650 mg Oral Given 05/10/20 2236)    Or  acetaminophen (TYLENOL) suppository 650 mg ( Rectal See Alternative 05/10/20 2236)  oxyCODONE (Oxy IR/ROXICODONE) immediate release tablet 5 mg (5 mg Oral Given 05/11/20 0209)  morphine 2 MG/ML injection 2 mg (has no administration in time range)  polyethylene glycol (MIRALAX / GLYCOLAX) packet 17 g (has no administration in time range)  ondansetron (ZOFRAN) tablet 4 mg (has no administration in time range)    Or  ondansetron (ZOFRAN) injection 4 mg (has no administration in time range)  oxyCODONE-acetaminophen (PERCOCET/ROXICET) 5-325 MG per tablet 2 tablet (2 tablets Oral Given 05/10/20 0404)  fentaNYL (SUBLIMAZE) injection 50 mcg (50 mcg Intravenous Given 05/10/20 0524)    ED Course  I have reviewed the triage vital signs and the nursing notes.  Pertinent labs & imaging results that were available during my care of the patient were reviewed by me and considered in my medical decision making (see chart for details).  Clinical Course as of 05/11/20 7867  Nancy Fetter May 10, 2020  0402 DG Shoulder Right Portable [JM]    Clinical Course User Index [JM] Wealthy Danielski, Corene Cornea, MD   MDM Rules/Calculators/A&P                         eval for traumatic injury. No indication for head imaging at this  time Patient found to have a right humeral neck fracture.  Discussed with Dr. Percell Miller for surgery as the patient lives at home and is walker dependent for ADLs.  He agreed to see the patient for evaluation and further work-up.  Request admission to the hospitalist for dehydration and elevated INR (secondary to Coumadin use).  Final Clinical Impression(s) / ED Diagnoses Final diagnoses:  Closed displaced fracture of surgical neck of right humerus, unspecified fracture morphology, initial encounter    Rx / DC Orders ED Discharge Orders    None       Aahan Marques, Corene Cornea, MD 05/11/20 [phone]

## 2020-05-10 NOTE — Telephone Encounter (Signed)
Duplicate

## 2020-05-10 NOTE — Progress Notes (Signed)
ANTICOAGULATION CONSULT NOTE - Initial Consult  Pharmacy Consult for warfarin Indication: atrial fibrillation  No Known Allergies  Patient Measurements: Height: 5\' 9"  (175.3 cm) Weight: 92 kg (202 lb 13.2 oz) IBW/kg (Calculated) : 70.7  Vital Signs: Temp: 98.6 F (37 C) (01/23 0747) Temp Source: Oral (01/23 0747) BP: 127/84 (01/23 1000) Pulse Rate: 86 (01/23 1000)  Labs: Recent Labs    05/10/20 0350  HGB 12.0*  HCT 37.5*  PLT 244  LABPROT 29.6*  INR 2.9*  CREATININE 2.01*    Estimated Creatinine Clearance: 31.7 mL/min (A) (by C-G formula based on SCr of 2.01 mg/dL (H)).   Medical History: Past Medical History:  Diagnosis Date  . Hypertension    Assessment: 62 YOM presenting with fall, hx of afib on warfarin PTA.  INR on admission 2.9 (last dose 1/22), no reported head injury however R-humerus fx and to hold warfarin for possible surgical intervention.   PTA dosing: 5mg  daily  Goal of Therapy:  INR 2-3 Monitor platelets by anticoagulation protocol: Yes   Plan:  Hold warfarin today Daily INR, s/s bleeding F/u INR <2 to bridge with heparin, ortho plans surgery Tuesday 1/25  Bertis Ruddy, PharmD Clinical Pharmacist ED Pharmacist Phone # [phone] 05/10/2020 10:27 AM

## 2020-05-10 NOTE — H&P (Signed)
TRH H&P    Patient Demographics:    Nathan [name], is a 80 y.o. male  MRN: [id number]  DOB - [birth year]  Admit Date - 05/10/2020  Referring MD/NP/PA: [place]  Outpatient Primary MD for the patient is Janith Lima, MD  Patient coming from: Home  Chief complaint- Fall   HPI:    Nathan [name]  is a 80 y.o. male, with history of atrial fibrillation, hypertension, presents to the ED with a chief complaint of fall.  Patient reports that he was walking down his walker got going too fast hit the brakes on his walker and went over the top of the walker and landed on his right shoulder.  He immediately felt pain and knew something was wrong.  He called his son who called EMS to bring him into the ER.  Patient reports that before he fell he had no preceding symptoms including no chest pain, no shortness of breath, no palpitations, no dizziness.  Patient reports that he did not hit his head.  He has had no change in vision, no change in hearing, no headache, no neck ache.  Patient is on anticoagulation, INR 2.9.  He reports that he has been compliant with his warfarin and that his PCP was going to check his INR again in a couple of weeks.  Patient reports he is otherwise been in his normal state of health, and has no complaints.  Patient is walker dependent and lives alone.  Patient does not smoke, he occasionally drinks a glass of wine, he does not use illicit drugs.  Patient is full code, he is vaccinated for COVID.  In the ED Temp 98.9, heart rate 68-111, respiratory rate 15-20, blood pressure 155/94 at admission, but had been as high as 183/172 No leukocytosis with a white blood cell count of 9.5, hemoglobin 12.0 Chemistry shows a creatinine of 2.01 and a BUN of 45 -patient thinks he may have been told he has chronic kidney disease in the past, but he is not sure.  Patient reports he knows he had a cyst on his kidney  that was "checked for cancer" and cleared as nonmalignant. Albumin 3.3 INR 2.9 Chest x-ray showed a proximal right humerus fracture, no acute lung process CT right shoulder showed a comminuted fracture of proximal humeral metadiaphysis with displacement, angulation, and override.  Greater tuberosity fracture associated. EKG showed a heart rate of 98, A. fib, QTC of 458, no acute ischemic changes Patient was given pain medicine in the ED, Ortho was consulted and reports they will see him in the a.m. Patient was placed in shoulder immobilizer Admission requested for possible surgical shoulder repair, rehab    Review of systems:    In addition to the HPI above,  No Fever-chills, No Headache, No changes with Vision or hearing, No problems swallowing food or Liquids, No Chest pain, Cough or Shortness of Breath, No Abdominal pain, No Nausea or Vomiting, bowel movements are regular, No Blood in stool or Urine, No dysuria, No new skin rashes or bruises,,  No new  weakness, tingling, numbness in any extremity, No recent weight gain or loss, No polyuria, polydypsia or polyphagia, No significant Mental Stressors.  All other systems reviewed and are negative.    Past History of the following :    Past Medical History:  Diagnosis Date  . Hypertension       History reviewed. No pertinent surgical history.    Social History:      Social History   Tobacco Use  . Smoking status: Never Smoker  . Smokeless tobacco: Never Used  Substance Use Topics  . Alcohol use: Never       Family History :    No family history on file. Family history hypertension   Home Medications:   Prior to Admission medications   Medication Sig Start Date End Date Taking? Authorizing Provider  acetaminophen (TYLENOL) 325 MG tablet Take 975 mg by mouth daily.   Yes [provider]  allopurinol (ZYLOPRIM) 100 MG tablet Take 100 mg by mouth daily. 03/24/20  Yes [provider]   diltiazem (CARDIZEM CD) 120 MG 24 hr capsule Take 120 mg by mouth daily. 05/06/20  Yes [provider]  finasteride (PROSCAR) 5 MG tablet Take 5 mg by mouth daily. 03/04/20  Yes [provider]  furosemide (LASIX) 40 MG tablet Take 40 mg by mouth 2 (two) times daily. 04/30/20  Yes [provider]  levocetirizine (XYZAL) 5 MG tablet Take 5 mg by mouth at bedtime as needed for allergies. 03/16/20  Yes [provider]  lisinopril (ZESTRIL) 40 MG tablet Take 40 mg by mouth daily. 12/24/19  Yes [provider]  metoprolol tartrate (LOPRESSOR) 50 MG tablet Take 50 mg by mouth 2 (two) times daily. 03/13/20  Yes [provider]  warfarin (COUMADIN) 5 MG tablet Take 5 mg by mouth daily. 04/07/20  Yes [provider]     Allergies:    No Known Allergies   Physical Exam:   Vitals  Blood pressure 128/90, pulse 91, temperature 98.9 F (37.2 C), temperature source Oral, resp. rate (!) 21, height 5\' 9"  (1.753 m), weight 92 kg, SpO2 98 %.  1.  General: Patient resting comfortably supine in bed upon my entry into room  2. Psychiatric: Mood and behavior normal for situation, patient is pleasant and cooperative with exam  3. Neurologic: Cranial nerves II through XII are grossly intact equal sensation in the upper and lower extremities bilaterally, speech and language are normal  4. HEENMT:  Head is atraumatic, normocephalic, pupils are reactive to light, neck is supple, trachea is midline, patient is extremely hard of hearing  5. Respiratory : Lungs are clear to auscultation bilaterally without wheeze, crackles, rhonchi  6. Cardiovascular : Heart rate is normal, rhythm is irregularly irregular, no acute peripheral edema  7. Gastrointestinal:  Abdomen is soft, nondistended, nontender to palpation  8. Skin:  Skin is warm dry and intact, venous stasis changes in the lower extremities greater on the left than the  right  9.Musculoskeletal:  Chronic deformity of left ankle from previous accident, no acute deformities, no calf tenderness    Data Review:    CBC Recent Labs  Lab 05/10/20 0350  WBC 9.5  HGB 12.0*  HCT 37.5*  PLT 244  MCV 97.9  MCH 31.3  MCHC 32.0  RDW 15.4  LYMPHSABS 0.5*  MONOABS 0.9  EOSABS 0.1  BASOSABS 0.0   ------------------------------------------------------------------------------------------------------------------  Results for orders placed or performed during the hospital encounter of 05/10/20 (from the past 48  hour(s))  SARS Coronavirus 2 by RT PCR (hospital order, performed in [facility] hospital lab) Nasopharyngeal Nasopharyngeal Swab     Status: None   Collection Time: 05/10/20  3:49 AM   Specimen: Nasopharyngeal Swab  Result Value Ref Range   SARS Coronavirus 2 NEGATIVE NEGATIVE    Comment: (NOTE) SARS-CoV-2 target nucleic acids are NOT DETECTED.  The SARS-CoV-2 RNA is generally detectable in upper and lower respiratory specimens during the acute phase of infection. The lowest concentration of SARS-CoV-2 viral copies this assay can detect is 250 copies / mL. A negative result does not preclude SARS-CoV-2 infection and should not be used as the sole basis for treatment or other patient management decisions.  A negative result may occur with improper specimen collection / handling, submission of specimen other than nasopharyngeal swab, presence of viral mutation(s) within the areas targeted by this assay, and inadequate number of viral copies (<250 copies / mL). A negative result must be combined with clinical observations, patient history, and epidemiological information.  Fact Sheet for Patients:   StrictlyIdeas.no  Fact Sheet for Healthcare Providers: BankingDealers.co.za  This test is not yet approved or  cleared by the [place] FDA and has been authorized for detection and/or diagnosis of  SARS-CoV-2 by FDA under an Emergency Use Authorization (EUA).  This EUA will remain in effect (meaning this test can be used) for the duration of the COVID-19 declaration under Section 564(b)(1) of the Act, 21 U.S.C. section 360bbb-3(b)(1), unless the authorization is terminated or revoked sooner.  Performed at [facility], [place] [address], [place], [place] [address]   CBC with Differential     Status: Abnormal   Collection Time: 05/10/20  3:50 AM  Result Value Ref Range   WBC 9.5 4.0 - 10.5 K/uL   RBC 3.83 (L) 4.22 - 5.81 MIL/uL   Hemoglobin 12.0 (L) 13.0 - 17.0 g/dL   HCT 37.5 (L) 39.0 - 52.0 %   MCV 97.9 80.0 - 100.0 fL   MCH 31.3 26.0 - 34.0 pg   MCHC 32.0 30.0 - 36.0 g/dL   RDW 15.4 11.5 - 15.5 %   Platelets 244 150 - 400 K/uL   nRBC 0.0 0.0 - 0.2 %   Neutrophils Relative % 83 %   Neutro Abs 8.0 (H) 1.7 - 7.7 K/uL   Lymphocytes Relative 5 %   Lymphs Abs 0.5 (L) 0.7 - 4.0 K/uL   Monocytes Relative 10 %   Monocytes Absolute 0.9 0.1 - 1.0 K/uL   Eosinophils Relative 1 %   Eosinophils Absolute 0.1 0.0 - 0.5 K/uL   Basophils Relative 0 %   Basophils Absolute 0.0 0.0 - 0.1 K/uL   Immature Granulocytes 1 %   Abs Immature Granulocytes 0.07 0.00 - 0.07 K/uL    Comment: Performed at [facility], [place] [address], [place], [place] [address]  Comprehensive metabolic panel     Status: Abnormal   Collection Time: 05/10/20  3:50 AM  Result Value Ref Range   Sodium 141 135 - 145 mmol/L   Potassium 4.6 3.5 - 5.1 mmol/L   Chloride 105 98 - 111 mmol/L   CO2 25 22 - 32 mmol/L   Glucose, Bld 129 (H) 70 - 99 mg/dL    Comment: Glucose reference range applies only to samples taken after fasting for at least 8 hours.   BUN 45 (H) 8 - 23 mg/dL   Creatinine, Ser 2.01 (H) 0.61 - 1.24 mg/dL   Calcium  9.2 8.9 - 10.3 mg/dL   Total Protein 6.6 6.5 - 8.1 g/dL   Albumin 3.3 (L) 3.5 - 5.0 g/dL   AST 25 15 - 41 U/L   ALT 18 0 - 44 U/L   Alkaline Phosphatase 63 38 - 126 U/L   Total  Bilirubin 0.9 0.3 - 1.2 mg/dL   GFR, Estimated 33 (L) >60 mL/min    Comment: (NOTE) Calculated using the CKD-EPI Creatinine Equation (2021)    Anion gap 11 5 - 15    Comment: Performed at [place] [address], [place], [place] [address]  Protime-INR     Status: Abnormal   Collection Time: 05/10/20  3:50 AM  Result Value Ref Range   Prothrombin Time 29.6 (H) 11.4 - 15.2 seconds   INR 2.9 (H) 0.8 - 1.2    Comment: (NOTE) INR goal varies based on device and disease states. Performed at [facility], [place] [address] [place], [place] [address]     Chemistries  Recent Labs  Lab 05/10/20 0350  NA 141  K 4.6  CL 105  CO2 25  GLUCOSE 129*  BUN 45*  CREATININE 2.01*  CALCIUM 9.2  AST 25  ALT 18  ALKPHOS 63  BILITOT 0.9   ------------------------------------------------------------------------------------------------------------------  ------------------------------------------------------------------------------------------------------------------ GFR: Estimated Creatinine Clearance: 31.7 mL/min (A) (by C-G formula based on SCr of 2.01 mg/dL (H)). Liver Function Tests: Recent Labs  Lab 05/10/20 0350  AST 25  ALT 18  ALKPHOS 63  BILITOT 0.9  PROT 6.6  ALBUMIN 3.3*   No results for input(s): LIPASE, AMYLASE in the last 168 hours. No results for input(s): AMMONIA in the last 168 hours. Coagulation Profile: Recent Labs  Lab 05/10/20 0350  INR 2.9*   Cardiac Enzymes: No results for input(s): CKTOTAL, CKMB, CKMBINDEX, TROPONINI in the last 168 hours. BNP (last 3 results) No results for input(s): PROBNP in the last 8760 hours. HbA1C: No results for input(s): HGBA1C in the last 72 hours. CBG: No results for input(s): GLUCAP in the last 168 hours. Lipid Profile: No results for input(s): CHOL, HDL, LDLCALC, TRIG, CHOLHDL, LDLDIRECT in the last 72 hours. Thyroid Function Tests: No results for input(s): TSH, T4TOTAL, FREET4, T3FREE, THYROIDAB in  the last 72 hours. Anemia Panel: No results for input(s): VITAMINB12, FOLATE, FERRITIN, TIBC, IRON, RETICCTPCT in the last 72 hours.  --------------------------------------------------------------------------------------------------------------- Urine analysis: No results found for: COLORURINE, APPEARANCEUR, LABSPEC, PHURINE, GLUCOSEU, HGBUR, BILIRUBINUR, KETONESUR, PROTEINUR, UROBILINOGEN, NITRITE, LEUKOCYTESUR    Imaging Results:    CT Shoulder Right Wo Contrast  Result Date: 05/10/2020 CLINICAL DATA:  Right humeral fracture EXAM: CT OF THE UPPER RIGHT EXTREMITY WITHOUT CONTRAST TECHNIQUE: Multidetector CT imaging of the upper right extremity was performed according to the standard protocol. COMPARISON:  None. FINDINGS: Bones/Joint/Cartilage There is a comminuted fracture of the proximal humerus with both transverse and oblique fractures involving the surgical neck of the humerus and proximal diaphysis of the humerus, respectively. The humeral shaft is displaced posterior to the humeral head by 1 shaft with, displaced medial to the humeral head by 1/2 shaft with, demonstrates roughly 3 cm override, and demonstrates roughly 40 degrees anterior and 30 degrees lateral angulation. A minimally displaced fracture is also seen involving the greater tuberosity of the humerus with fracture fragments in anatomic alignment. The articular surface of the humeral head is still seated within the glenoid fossa. The visualized clavicle and the scapula are intact. The acromioclavicular joint space is preserved. The visualized right thoracic  cage is intact. Ligaments Suboptimally assessed by CT. Muscles and Tendons Unremarkable Soft tissues Small left shoulder effusion present. Infiltrative change within the a soft tissues surrounding the proximal humerus and inferior to the right shoulder likely represents small amount of interstitial hemorrhage and/or edema. IMPRESSION: Comminuted fracture of the proximal humeral  metadiaphysis with displacement, angulation, and override as described above. Associated minimally displaced anatomically aligned greater tuberosity fracture. Electronically Signed   By: Fidela Salisbury MD   On: 05/10/2020 05:20   DG Chest Portable 1 View  Result Date: 05/10/2020 CLINICAL DATA:  Fall EXAM: PORTABLE CHEST 1 VIEW COMPARISON:  Chest CT 02/03/2020 FINDINGS: Cardiomegaly and aortic tortuosity. Vascular fullness at the hila. There is no edema, consolidation, effusion, or pneumothorax. Displaced proximal humerus fracture. IMPRESSION: 1. Right proximal humerus fracture. 2. Chronic cardiomegaly. Electronically Signed   By: Monte Fantasia M.D.   On: 05/10/2020 04:15   DG Shoulder Right Portable  Result Date: 05/10/2020 CLINICAL DATA:  Fall. EXAM: PORTABLE RIGHT SHOULDER COMPARISON:  None. FINDINGS: Humeral neck fracture with over 100% displacement and fracture overlapping. There may be continuation along the greater tuberosity, but no measurable displacement at this level on the submitted views. Generalized osteopenia. IMPRESSION: Displaced and overriding humeral neck fracture. Electronically Signed   By: Monte Fantasia M.D.   On: 05/10/2020 04:17    My personal review of EKG: Rhythm Afib, Rate 98 /min, QTc 458 ,no Acute ST changes   Assessment & Plan:    Principal Problem:   Comminuted fracture of right humerus Active Problems:   Fall at home, initial encounter   A-fib ([place])   Elevated serum creatinine   1. Fall 1. No reported head injury, no neuro deficit 2. Mechanical fall over walker  3. Given age, and anticoagulation low threshold for CT head w/o contrast if pt complains of any neuro symptoms 2. Comminuted fracture of right humerus 1. 2/2 to above 2. Ortho consulted rec's shoulder immobilizer and they will see in the AM 3. Pain scale for pain control 4. NPO in case of surgery 5. Therapeutic INR - Ortho did not recommend reversal at this point 6. Patient will likely need  rehab after this admission - lives alone and is walker dependent 7. Pre-op UA pending 8. EKG shows HR 98, Afib, no acute changes 9. CXR = no acute lung disease 3. Afib 1. Holding warfarin in case of surgery 2. Trend INR in the AM 3. Continue diltiazem and metoprolol 4. HTN 1. Continue above medications + ACE 5. Elevated Cr 1. No baseline to compare 2. Continue ACE - given uncontrolled BP in ED up to 183/172 3. Holding lasix 4. Continue to monitor 5. Trend in the AM 6. Mild protein calorie malnutrition 1. Secondary to poor appetite, poor p.o. intake 2. Discussed the importance of nutrient dense food choices   DVT Prophylaxis-    SCDs   AM Labs Ordered, also please review Full Orders  Family Communication: No family at bedside  Code Status: Full  Admission status: Inpatient :The appropriate admission status for this patient is INPATIENT. Inpatient status is judged to be reasonable and necessary in order to provide the required intensity of service to ensure the patient's safety. The patient's presenting symptoms, physical exam findings, and initial radiographic and laboratory data in the context of their chronic comorbidities is felt to place them at high risk for further clinical deterioration. Furthermore, it is not anticipated that the patient will be medically stable for discharge from the hospital within  2 midnights of admission. The following factors support the admission status of inpatient.     The patient's presenting symptoms include shoulder pain The worrisome physical exam findings include swollen shoulder joint The initial radiographic and laboratory data are worrisome because of elevated Cr, right humerus fracture The chronic co-morbidities include Afib, HTN       * I certify that at the point of admission it is my clinical judgment that the patient will require inpatient hospital care spanning beyond 2 midnights from the point of admission due to high intensity  of service, high risk for further deterioration and high frequency of surveillance required.*  Time spent in minutes :[place]

## 2020-05-10 NOTE — ED Notes (Signed)
I obtained verbal consent from pt to update son Nathan [name] on pt's condition and plan

## 2020-05-10 NOTE — ED Notes (Signed)
Patient transported to CT scan.

## 2020-05-10 NOTE — ED Triage Notes (Signed)
Patient arrived with EMS from home , tripped and fell this morning while using his walker , no LOC , reports right shoulder pain . CBG= 187. Level 2 trauma - he is taking Coumadin .

## 2020-05-10 NOTE — ED Notes (Signed)
CT notified that patient is ready for CT scan.

## 2020-05-11 DIAGNOSIS — S42351A Displaced comminuted fracture of shaft of humerus, right arm, initial encounter for closed fracture: Secondary | ICD-10-CM | POA: Diagnosis not present

## 2020-05-11 LAB — CBC WITH DIFFERENTIAL/PLATELET
Abs Immature Granulocytes: 0.03 10*3/uL (ref 0.00–0.07)
Basophils Absolute: 0 10*3/uL (ref 0.0–0.1)
Basophils Relative: 0 %
Eosinophils Absolute: 0 10*3/uL (ref 0.0–0.5)
Eosinophils Relative: 0 %
HCT: 32.5 % — ABNORMAL LOW (ref 39.0–52.0)
Hemoglobin: 10.2 g/dL — ABNORMAL LOW (ref 13.0–17.0)
Immature Granulocytes: 1 %
Lymphocytes Relative: 17 %
Lymphs Abs: 0.8 10*3/uL (ref 0.7–4.0)
MCH: 30.4 pg (ref 26.0–34.0)
MCHC: 31.4 g/dL (ref 30.0–36.0)
MCV: 97 fL (ref 80.0–100.0)
Monocytes Absolute: 1 10*3/uL (ref 0.1–1.0)
Monocytes Relative: 20 %
Neutro Abs: 3 10*3/uL (ref 1.7–7.7)
Neutrophils Relative %: 62 %
Platelets: 209 10*3/uL (ref 150–400)
RBC: 3.35 MIL/uL — ABNORMAL LOW (ref 4.22–5.81)
RDW: 15.7 % — ABNORMAL HIGH (ref 11.5–15.5)
WBC: 4.9 10*3/uL (ref 4.0–10.5)
nRBC: 0 % (ref 0.0–0.2)

## 2020-05-11 LAB — COMPREHENSIVE METABOLIC PANEL
ALT: 18 U/L (ref 0–44)
AST: 38 U/L (ref 15–41)
Albumin: 2.8 g/dL — ABNORMAL LOW (ref 3.5–5.0)
Alkaline Phosphatase: 42 U/L (ref 38–126)
Anion gap: 10 (ref 5–15)
BUN: 42 mg/dL — ABNORMAL HIGH (ref 8–23)
CO2: 23 mmol/L (ref 22–32)
Calcium: 8.4 mg/dL — ABNORMAL LOW (ref 8.9–10.3)
Chloride: 108 mmol/L (ref 98–111)
Creatinine, Ser: 1.86 mg/dL — ABNORMAL HIGH (ref 0.61–1.24)
GFR, Estimated: 36 mL/min — ABNORMAL LOW (ref >60)
Glucose, Bld: 97 mg/dL (ref 70–99)
Potassium: 3.8 mmol/L (ref 3.5–5.1)
Sodium: 141 mmol/L (ref 135–145)
Total Bilirubin: 0.5 mg/dL (ref 0.3–1.2)
Total Protein: 5.5 g/dL — ABNORMAL LOW (ref 6.5–8.1)

## 2020-05-11 LAB — PROTIME-INR
INR: 2.3 — ABNORMAL HIGH (ref 0.8–1.2)
Prothrombin Time: 24.7 seconds — ABNORMAL HIGH (ref 11.4–15.2)

## 2020-05-11 LAB — URINALYSIS, ROUTINE W REFLEX MICROSCOPIC
Bilirubin Urine: NEGATIVE
Glucose, UA: NEGATIVE mg/dL
Hgb urine dipstick: NEGATIVE
Ketones, ur: NEGATIVE mg/dL
Nitrite: NEGATIVE
Protein, ur: 100 mg/dL — AB
Specific Gravity, Urine: 1.016 (ref 1.005–1.030)
pH: 6 (ref 5.0–8.0)

## 2020-05-11 LAB — MAGNESIUM: Magnesium: 2.1 mg/dL (ref 1.7–2.4)

## 2020-05-11 MED ORDER — PANTOPRAZOLE SODIUM 40 MG IV SOLR
40.0000 mg | Freq: Every day | INTRAVENOUS | Status: DC
  Administered 2020-05-11: 40 mg via INTRAVENOUS

## 2020-05-11 MED ORDER — MORPHINE SULFATE (PF) 2 MG/ML IV SOLN: 1.0000 mg | INTRAVENOUS | Status: DC | PRN

## 2020-05-11 MED ORDER — SODIUM CHLORIDE 0.9 % IV SOLN: INTRAVENOUS | Status: DC

## 2020-05-11 MED ORDER — CEFAZOLIN SODIUM-DEXTROSE 2-4 GM/100ML-% IV SOLN
2.0000 g | INTRAVENOUS | Status: AC
  Administered 2020-05-12: 2 g via INTRAVENOUS
  Filled 2020-05-11: qty 100

## 2020-05-11 MED ORDER — POVIDONE-IODINE 10 % EX SWAB
2.0000 "application " | Freq: Once | CUTANEOUS | Status: AC
  Administered 2020-05-12: 2 via TOPICAL

## 2020-05-11 MED ORDER — ENSURE PRE-SURGERY PO LIQD
296.0000 mL | Freq: Once | ORAL | Status: AC
  Administered 2020-05-12: 296 mL via ORAL
  Filled 2020-05-11: qty 296

## 2020-05-11 MED ORDER — CHLORHEXIDINE GLUCONATE 4 % EX LIQD: 60.0000 mL | Freq: Once | CUTANEOUS | Status: AC

## 2020-05-11 MED ORDER — ALUM & MAG HYDROXIDE-SIMETH 200-200-20 MG/5ML PO SUSP
15.0000 mL | Freq: Four times a day (QID) | ORAL | Status: DC | PRN
  Administered 2020-05-11: 15 mL via ORAL
  Filled 2020-05-11: qty 30

## 2020-05-11 MED ORDER — TRANEXAMIC ACID-NACL 1000-0.7 MG/100ML-% IV SOLN
1000.0000 mg | INTRAVENOUS | Status: AC
  Administered 2020-05-12: 1000 mg via INTRAVENOUS
  Filled 2020-05-11: qty 100

## 2020-05-11 MED ORDER — WARFARIN - PHARMACIST DOSING INPATIENT: Freq: Every day | Status: DC

## 2020-05-11 NOTE — H&P (View-Only) (Signed)
    Subjective: Patient reports pain as mild so long as he does not move. .  Tolerating diet.  Urinating.  +Flatus.  No CP, SOB.   OOB   Objective:   VITALS:   Vitals:   05/10/20 2220 05/11/20 0019 05/11/20 0205 05/11/20 0611  BP: (!) 146/92 117/83 135/90 (!) 134/95  Pulse: 85 74 85 86  Resp: 18 18 17 18   Temp: (!) 101.6 F (38.7 C) 98.6 F (37 C) 99.3 F (37.4 C) 98.9 F (37.2 C)  TempSrc: Oral Oral Oral Oral  SpO2: 99% 92% 96% 97%  Weight:      Height:       CBC Latest Ref Rng & Units 05/11/2020 05/10/2020  WBC 4.0 - 10.5 K/uL 4.9 9.5  Hemoglobin 13.0 - 17.0 g/dL 10.2(L) 12.0(L)  Hematocrit 39.0 - 52.0 % 32.5(L) 37.5(L)  Platelets 150 - 400 K/uL 209 244   BMP Latest Ref Rng & Units 05/11/2020 05/10/2020  Glucose 70 - 99 mg/dL 97 129(H)  BUN 8 - 23 mg/dL 42(H) 45(H)  Creatinine 0.61 - 1.24 mg/dL 1.86(H) 2.01(H)  Sodium 135 - 145 mmol/L 141 141  Potassium 3.5 - 5.1 mmol/L 3.8 4.6  Chloride 98 - 111 mmol/L 108 105  CO2 22 - 32 mmol/L 23 25  Calcium 8.9 - 10.3 mg/dL 8.4(L) 9.2   Intake/Output      01/23 0701 01/24 0700 01/24 0701 01/25 0700   P.O. 120    Total Intake(mL/kg) 120 (1.3)    Urine (mL/kg/hr) 150 (0.1)    Stool 0    Total Output 150    Net -30         Urine Occurrence 1 x    Stool Occurrence 1 x       Physical Exam: General: NAD.  Resting in bed Resp: No increased wob Cardio: regular rate and rhythm ABD soft Neurologically intact MSK Neurovascularly intact Sensation intact distally Intact pulses distally Dorsiflexion/Plantar flexion intact Incision: dressing C/D/I Neurovascular intact  Assessment:    Plan for  Procedure(s) (LRB): OPEN REDUCTION INTERNAL FIXATION (ORIF) DISTAL HUMERUS FRACTURE (Right) by Dr. Ernesta Amble. Murphy on 05/12/20  Principal Problem:   Comminuted fracture of right humerus Active Problems:   Fall at home, initial encounter   A-fib [facility])   Elevated serum creatinine       Plan:   Plan for ORIF of the  right proximal humerus tomorrow. Incentive Spirometry Elevate and Apply ice  Weightbearing: NWB RUE Orthopedic device(s): Sling Showering: Keep dressing dry VTE prophylaxis: Lovenox okay for to day if desired. Please hold all chemical anticoagulation tomorrow morning for surgery, SCDs, ambulation Pain control: Tylenol, Oxycodone, IV Morphine Diet: NPO after MN tonight. Contact information:  Edmonia Lynch MD, Margy Clarks PA-C  Dispo: to be determined post-op     Rachael Fee, PA-C 05/11/2020, 7:35 AM

## 2020-05-11 NOTE — Telephone Encounter (Signed)
Voicemail box not yet set up. contacted 1330 on 05/11/2020.  Patient needs call back.

## 2020-05-11 NOTE — Progress Notes (Signed)
PROGRESS NOTE  Nathan [name]  DOB: 07/03/1940  PCP: Nathan Lima, MD UJW:[id number]  DOA: 05/10/2020  LOS: 1 day   Chief Complaint  Patient presents with  . Fall  Coumadin    Level2   Brief narrative: Nathan [name] is a 80 y.o. male with PMH significant for history of atrial fibrillation on anticoagulation, hypertension. Patient presented to the ED on 1/23 after a fall impacting his right shoulder on the ground. X-ray showed proximal right humerus fracture, no acute lung process CT right shoulder showed a comminuted fracture of proximal humeral metadiaphysis with displacement, angulation, and override.  Greater tuberosity fracture associated. Admitted under hospitalist service Orthopedic consultation obtained.  Subjective: Patient was seen and examined this morning.  Pleasant elderly Caucasian male.  Propped up in bed.  Not in distress.  Assessment/Plan: Comminuted fracture of right humerus -After mechanical fall. -Orthopedic consulted.  Pending surgical fixation. -Currently pain controlled on morphine as needed and oxycodone as needed.  Impaired mobility -Uses a walker at home. -PT to evaluate post procedure.  Afib -On long-term anticoagulation with Coumadin.   -INR remains therapeutic.   -Continue diltiazem and metoprolol  HTN -Continue Cardizem, metoprolol.Lasix and lisinopril on hold.   -Continue to monitor blood pressure.  AKI -Creatinine was elevated to 2.01 on admission.  Improving now, 1.86 today. -Lasix and lisinopril on hold. -Continue to monitor. Recent Labs    05/10/20 0350 05/11/20 0521  BUN 45* 42*  CREATININE 2.01* 1.86*   Mild protein calorie malnutrition -Secondary to poor appetite, poor p.o. intake -Discussed the importance of nutrient dense food choices  Mobility: PT eval postprocedure Code Status:   Code Status: Full Code  Nutritional status: Body mass index is 29.95 kg/m.     Diet Order            Diet Heart Room service  appropriate? Yes; Fluid consistency: Thin  Diet effective now                 DVT prophylaxis: SCDs Start: 05/10/20 2209   Antimicrobials:  None Fluid: None Consultants: Orthopedics Family Communication:  None at bedside  Status is: Inpatient  Remains inpatient appropriate because: Pending surgical fixation  Dispo: The patient is from: Home              Anticipated d/c is to: Home versus rehab depending on PT recommendation              Anticipated d/c date is: 3 days              Patient currently is not medically stable to d/c.   Difficult to place patient No       Infusions:    Scheduled Meds: . diltiazem  120 mg Oral Daily  . finasteride  5 mg Oral Daily  . lisinopril  40 mg Oral Daily  . metoprolol tartrate  50 mg Oral BID  . Warfarin - Pharmacist Dosing Inpatient   Does not apply q1600    Antimicrobials: Anti-infectives (From admission, onward)   None      PRN meds: acetaminophen **OR** acetaminophen, morphine injection, ondansetron **OR** ondansetron (ZOFRAN) IV, oxyCODONE, polyethylene glycol   Objective: Vitals:   05/11/20 0930 05/11/20 1041  BP: 121/75 108/72  Pulse: 78 89  Resp: 18 18  Temp: 98.4 F (36.9 C) 97.7 F (36.5 C)  SpO2: 96% 95%    Intake/Output Summary (Last 24 hours) at 05/11/2020 1121 Last data filed at 05/11/2020 1000 Gross per 24 hour  Intake  240 ml  Output 250 ml  Net -10 ml   Filed Weights   05/10/20 0349  Weight: 92 kg   Weight change:  Body mass index is 29.95 kg/m.   Physical Exam: General exam: Pleasant, elderly Caucasian male.  Not in distress Skin: No rashes, lesions or ulcers. HEENT: Atraumatic, normocephalic, no obvious bleeding Lungs: Clear to auscultation bilaterally CVS: Regular rate and rhythm, no murmur GI/Abd soft, nontender, nondistended, bowel sound present CNS: Alert, awake, oriented x3 Psychiatry: Mood appropriate Extremities: Right shoulder in sling.  No pedal edema, no calf  tenderness  Data Review: I have personally reviewed the laboratory data and studies available.  Recent Labs  Lab 05/10/20 0350 05/11/20 0521  WBC 9.5 4.9  NEUTROABS 8.0* 3.0  HGB 12.0* 10.2*  HCT 37.5* 32.5*  MCV 97.9 97.0  PLT 244 209   Recent Labs  Lab 05/10/20 0350 05/11/20 0521  NA 141 141  K 4.6 3.8  CL 105 108  CO2 25 23  GLUCOSE 129* 97  BUN 45* 42*  CREATININE 2.01* 1.86*  CALCIUM 9.2 8.4*  MG  --  2.1    F/u labs ordered  Signed, [name], MD [facility] 05/11/2020

## 2020-05-11 NOTE — Progress Notes (Signed)
ANTICOAGULATION CONSULT NOTE   Pharmacy Consult for warfarin Indication: atrial fibrillation  No Known Allergies  Patient Measurements: Height: 5\' 9"  (175.3 cm) Weight: 92 kg (202 lb 13.2 oz) IBW/kg (Calculated) : 70.7  Vital Signs: Temp: 98.9 F (37.2 C) (01/24 0611) Temp Source: Oral (01/24 0611) BP: 134/95 (01/24 0611) Pulse Rate: 86 (01/24 0611)  Labs: Recent Labs    05/10/20 0350 05/11/20 0521  HGB 12.0* 10.2*  HCT 37.5* 32.5*  PLT 244 209  LABPROT 29.6* 24.7*  INR 2.9* 2.3*  CREATININE 2.01* 1.86*    Estimated Creatinine Clearance: 34.3 mL/min (A) (by C-G formula based on SCr of 1.86 mg/dL (H)).   Medical History: Past Medical History:  Diagnosis Date  . Hypertension    Assessment: 22 YOM presenting with fall, hx of afib on warfarin PTA.  INR on admission 2.9 (last dose 1/22), no reported head injury however R-humerus fx and to hold warfarin for possible surgical intervention.   PTA dosing: 5mg  daily  05/11/2020  INR 2.3 today- therapeutic after held yesterday HG 10.2, PLT WNL, no bleeding reported For ORIF R humerus tomorrow  Goal of Therapy:  INR 2-3 Monitor platelets by anticoagulation protocol: Yes   Plan:  Hold warfarin again today pending OR tomorrow Daily INR, s/s bleeding F/u INR <2 to bridge with heparin ortho plans surgery Tuesday 1/25  Eudelia Bunch, Pharm.D 05/11/2020 8:06 AM

## 2020-05-11 NOTE — TOC Initial Note (Signed)
Transition of Care [facility]) - Initial/Assessment Note    Patient Details  Name: Nathan [name] MRN: [id number] Date of Birth: May 13, 1940  [facility]) CM/SW Contact:    Lia Hopping, [place] Phone Number: 05/11/2020, 11:14 AM  Clinical Narrative:   Patient admitted after a fall and right shoulder pain. He was found to have a right humerus fracture. CSW met with the patient at bedside introduced role and reason for the visit. Patient reports he lives alone but has daily support from his significant other and adult children. Patient is independent with ADL's and receives assistance with meal preparation. Patient uses a walker at baseline.   TOC will follow for disposition needs.     Barriers to Discharge: Continued Medical Work up   Patient Goals and CMS Choice   CMS Medicare.gov Compare Post Acute Care list provided to:: Patient Choice offered to / list presented to : Patient  Expected Discharge Plan and Services   In-house Referral: Clinical Social Work Discharge Planning Services: CM Consult   Living arrangements for the past 2 months: Single Family Home                                      Prior Living Arrangements/Services Living arrangements for the past 2 months: Single Family Home Lives with:: Self Patient language and need for interpreter reviewed:: No Do you feel safe going back to the place where you live?: Yes      Need for Family Participation in Patient Care: Yes (Comment) Care giver support system in place?: Yes (comment) Current home services: DME Criminal Activity/Legal Involvement Pertinent to Current Situation/Hospitalization: No - Comment as needed  Activities of Daily Living Home Assistive Devices/Equipment: Gilford Rile (specify type) ADL Screening (condition at time of admission) Patient's cognitive ability adequate to safely complete daily activities?: Yes Is the patient deaf or have difficulty hearing?: Yes Does the patient have  difficulty seeing, even when wearing glasses/contacts?: No Does the patient have difficulty concentrating, remembering, or making decisions?: No Patient able to express need for assistance with ADLs?: Yes Does the patient have difficulty dressing or bathing?: No Independently performs ADLs?: No Communication: Independent Dressing (OT): Independent Grooming: Independent Feeding: Independent Bathing: Independent with device (comment) Toileting: Independent In/Out Bed: Independent with device (comment) Walks in Home: Independent with device (comment) Does the patient have difficulty walking or climbing stairs?: Yes Weakness of Legs: Left Weakness of Arms/Hands: None  Permission Sought/Granted Permission sought to share information with : Family Supports Permission granted to share information with : Yes, Verbal Permission Granted              Emotional Assessment Appearance:: Appears stated age Attitude/Demeanor/Rapport: Engaged Affect (typically observed): Accepting,Pleasant Orientation: : Oriented to Self,Oriented to Place,Oriented to  Time,Oriented to Situation Alcohol / Substance Use: Not Applicable Psych Involvement: No (comment)  Admission diagnosis:  Comminuted fracture of right humerus [S42.351A] Closed displaced fracture of surgical neck of right humerus, unspecified fracture morphology, initial encounter [P53.614E] Patient Active Problem List   Diagnosis Date Noted  . Comminuted fracture of right humerus 05/10/2020  . Fall at home, initial encounter 05/10/2020  . A-fib ([place]) 05/10/2020  . Elevated serum creatinine 05/10/2020   PCP:  Janith Lima, MD Pharmacy:   [facility] [place] [place] [address] Phone:  [phone] Fax: [phone]     Social Determinants of Health (SDOH) Interventions    Readmission Risk Interventions No flowsheet data  found.

## 2020-05-11 NOTE — Progress Notes (Signed)
    Subjective: Patient reports pain as mild so long as he does not move. .  Tolerating diet.  Urinating.  +Flatus.  No CP, SOB.   OOB   Objective:   VITALS:   Vitals:   05/10/20 2220 05/11/20 0019 05/11/20 0205 05/11/20 0611  BP: (!) 146/92 117/83 135/90 (!) 134/95  Pulse: 85 74 85 86  Resp: 18 18 17 18  Temp: (!) 101.6 F (38.7 C) 98.6 F (37 C) 99.3 F (37.4 C) 98.9 F (37.2 C)  TempSrc: Oral Oral Oral Oral  SpO2: 99% 92% 96% 97%  Weight:      Height:       CBC Latest Ref Rng & Units 05/11/2020 05/10/2020  WBC 4.0 - 10.5 K/uL 4.9 9.5  Hemoglobin 13.0 - 17.0 g/dL 10.2(L) 12.0(L)  Hematocrit 39.0 - 52.0 % 32.5(L) 37.5(L)  Platelets 150 - 400 K/uL 209 244   BMP Latest Ref Rng & Units 05/11/2020 05/10/2020  Glucose 70 - 99 mg/dL 97 129(H)  BUN 8 - 23 mg/dL 42(H) 45(H)  Creatinine 0.61 - 1.24 mg/dL 1.86(H) 2.01(H)  Sodium 135 - 145 mmol/L 141 141  Potassium 3.5 - 5.1 mmol/L 3.8 4.6  Chloride 98 - 111 mmol/L 108 105  CO2 22 - 32 mmol/L 23 25  Calcium 8.9 - 10.3 mg/dL 8.4(L) 9.2   Intake/Output      01/23 0701 01/24 0700 01/24 0701 01/25 0700   P.O. 120    Total Intake(mL/kg) 120 (1.3)    Urine (mL/kg/hr) 150 (0.1)    Stool 0    Total Output 150    Net -30         Urine Occurrence 1 x    Stool Occurrence 1 x       Physical Exam: General: NAD.  Resting in bed Resp: No increased wob Cardio: regular rate and rhythm ABD soft Neurologically intact MSK Neurovascularly intact Sensation intact distally Intact pulses distally Dorsiflexion/Plantar flexion intact Incision: dressing C/D/I Neurovascular intact  Assessment:    Plan for  Procedure(s) (LRB): OPEN REDUCTION INTERNAL FIXATION (ORIF) DISTAL HUMERUS FRACTURE (Right) by Dr. Timothy D. Murphy on 05/12/20  Principal Problem:   Comminuted fracture of right humerus Active Problems:   Fall at home, initial encounter   A-fib ([facility])   Elevated serum creatinine       Plan:   Plan for ORIF of the  right proximal humerus tomorrow. Incentive Spirometry Elevate and Apply ice  Weightbearing: NWB RUE Orthopedic device(s): Sling Showering: Keep dressing dry VTE prophylaxis: Lovenox okay for to day if desired. Please hold all chemical anticoagulation tomorrow morning for surgery, SCDs, ambulation Pain control: Tylenol, Oxycodone, IV Morphine Diet: NPO after MN tonight. Contact information:  Timothy Murphy MD, Jeremy Johnson PA-C  Dispo: to be determined post-op     Jeremy M Johnson, PA-C 05/11/2020, 7:35 AM 

## 2020-05-12 ENCOUNTER — Encounter (HOSPITAL_COMMUNITY): Payer: Self-pay | Admitting: Internal Medicine

## 2020-05-12 ENCOUNTER — Encounter (HOSPITAL_COMMUNITY)
Admission: EM | Disposition: A | Discharge status: Skilled Nursing Facility | Payer: Self-pay | Source: Home / Self Care | Attending: Internal Medicine

## 2020-05-12 ENCOUNTER — Inpatient Hospital Stay (HOSPITAL_COMMUNITY): Payer: Medicare Other | Admitting: Anesthesiology

## 2020-05-12 ENCOUNTER — Ambulatory Visit: Payer: Medicare Other | Admitting: Internal Medicine

## 2020-05-12 ENCOUNTER — Inpatient Hospital Stay (HOSPITAL_COMMUNITY): Payer: Medicare Other

## 2020-05-12 ENCOUNTER — Ambulatory Visit: Payer: Medicare Other

## 2020-05-12 DIAGNOSIS — S42351A Displaced comminuted fracture of shaft of humerus, right arm, initial encounter for closed fracture: Secondary | ICD-10-CM | POA: Diagnosis not present

## 2020-05-12 HISTORY — PX: ORIF HUMERUS FRACTURE: SHX2126

## 2020-05-12 LAB — BASIC METABOLIC PANEL
Anion gap: 10 (ref 5–15)
BUN: 49 mg/dL — ABNORMAL HIGH (ref 8–23)
CO2: 23 mmol/L (ref 22–32)
Calcium: 8 mg/dL — ABNORMAL LOW (ref 8.9–10.3)
Chloride: 108 mmol/L (ref 98–111)
Creatinine, Ser: 2.03 mg/dL — ABNORMAL HIGH (ref 0.61–1.24)
GFR, Estimated: 32 mL/min — ABNORMAL LOW (ref >60)
Glucose, Bld: 82 mg/dL (ref 70–99)
Potassium: 3.7 mmol/L (ref 3.5–5.1)
Sodium: 141 mmol/L (ref 135–145)

## 2020-05-12 LAB — CBC WITH DIFFERENTIAL/PLATELET
Abs Immature Granulocytes: 0.06 10*3/uL (ref 0.00–0.07)
Basophils Absolute: 0 10*3/uL (ref 0.0–0.1)
Basophils Relative: 0 %
Eosinophils Absolute: 0.2 10*3/uL (ref 0.0–0.5)
Eosinophils Relative: 3 %
HCT: 31.1 % — ABNORMAL LOW (ref 39.0–52.0)
Hemoglobin: 9.7 g/dL — ABNORMAL LOW (ref 13.0–17.0)
Immature Granulocytes: 1 %
Lymphocytes Relative: 16 %
Lymphs Abs: 1 10*3/uL (ref 0.7–4.0)
MCH: 30.6 pg (ref 26.0–34.0)
MCHC: 31.2 g/dL (ref 30.0–36.0)
MCV: 98.1 fL (ref 80.0–100.0)
Monocytes Absolute: 0.9 10*3/uL (ref 0.1–1.0)
Monocytes Relative: 14 %
Neutro Abs: 3.9 10*3/uL (ref 1.7–7.7)
Neutrophils Relative %: 66 %
Platelets: 206 10*3/uL (ref 150–400)
RBC: 3.17 MIL/uL — ABNORMAL LOW (ref 4.22–5.81)
RDW: 15.5 % (ref 11.5–15.5)
WBC: 6 10*3/uL (ref 4.0–10.5)
nRBC: 0 % (ref 0.0–0.2)

## 2020-05-12 LAB — SURGICAL PCR SCREEN
MRSA, PCR: NEGATIVE
Staphylococcus aureus: NEGATIVE

## 2020-05-12 LAB — PROTIME-INR
INR: 1.9 — ABNORMAL HIGH (ref 0.8–1.2)
Prothrombin Time: 21 seconds — ABNORMAL HIGH (ref 11.4–15.2)

## 2020-05-12 SURGERY — OPEN REDUCTION INTERNAL FIXATION (ORIF) DISTAL HUMERUS FRACTURE
Anesthesia: Regional | Laterality: Right

## 2020-05-12 MED ORDER — LACTATED RINGERS IV SOLN: INTRAVENOUS | Status: DC

## 2020-05-12 MED ORDER — PROPOFOL 10 MG/ML IV BOLUS
INTRAVENOUS | Status: DC | PRN
  Administered 2020-05-12: 100 mg via INTRAVENOUS

## 2020-05-12 MED ORDER — OXYCODONE HCL 5 MG PO TABS
10.0000 mg | ORAL_TABLET | ORAL | Status: DC | PRN
  Filled 2020-05-12: qty 2

## 2020-05-12 MED ORDER — OXYCODONE HCL 5 MG PO TABS
5.0000 mg | ORAL_TABLET | ORAL | Status: DC | PRN
  Administered 2020-05-14: 10 mg via ORAL
  Filled 2020-05-12: qty 1
  Filled 2020-05-12: qty 2

## 2020-05-12 MED ORDER — ONDANSETRON HCL 4 MG/2ML IJ SOLN
INTRAMUSCULAR | Status: DC | PRN
  Administered 2020-05-12: 4 mg via INTRAVENOUS

## 2020-05-12 MED ORDER — PROPOFOL 10 MG/ML IV BOLUS
INTRAVENOUS | Status: AC
  Filled 2020-05-12: qty 20

## 2020-05-12 MED ORDER — METOPROLOL TARTRATE 5 MG/5ML IV SOLN
INTRAVENOUS | Status: AC
  Filled 2020-05-12: qty 5

## 2020-05-12 MED ORDER — PANTOPRAZOLE SODIUM 40 MG PO TBEC
40.0000 mg | DELAYED_RELEASE_TABLET | Freq: Every day | ORAL | Status: DC
  Administered 2020-05-12 – 2020-05-15 (×4): 40 mg via ORAL
  Filled 2020-05-12 (×4): qty 1

## 2020-05-12 MED ORDER — METOPROLOL TARTRATE 5 MG/5ML IV SOLN
INTRAVENOUS | Status: DC | PRN
  Administered 2020-05-12: 2 mg via INTRAVENOUS
  Administered 2020-05-12: 1 mg via INTRAVENOUS

## 2020-05-12 MED ORDER — CEFAZOLIN SODIUM-DEXTROSE 2-4 GM/100ML-% IV SOLN
2.0000 g | Freq: Four times a day (QID) | INTRAVENOUS | Status: AC
  Administered 2020-05-12 – 2020-05-13 (×3): 2 g via INTRAVENOUS
  Filled 2020-05-12 (×3): qty 100

## 2020-05-12 MED ORDER — METOCLOPRAMIDE HCL 5 MG PO TABS: 5.0000 mg | ORAL_TABLET | Freq: Three times a day (TID) | ORAL | Status: DC | PRN

## 2020-05-12 MED ORDER — MENTHOL 3 MG MT LOZG: 1.0000 | LOZENGE | OROMUCOSAL | Status: DC | PRN

## 2020-05-12 MED ORDER — PHENYLEPHRINE 40 MCG/ML (10ML) SYRINGE FOR IV PUSH (FOR BLOOD PRESSURE SUPPORT)
PREFILLED_SYRINGE | INTRAVENOUS | Status: DC | PRN
  Administered 2020-05-12: 120 ug via INTRAVENOUS
  Administered 2020-05-12: 80 ug via INTRAVENOUS
  Administered 2020-05-12: 160 ug via INTRAVENOUS
  Administered 2020-05-12 (×2): 120 ug via INTRAVENOUS

## 2020-05-12 MED ORDER — LIDOCAINE HCL (PF) 2 % IJ SOLN
INTRAMUSCULAR | Status: AC
  Filled 2020-05-12: qty 5

## 2020-05-12 MED ORDER — FENTANYL CITRATE (PF) 100 MCG/2ML IJ SOLN: 25.0000 ug | INTRAMUSCULAR | Status: DC | PRN

## 2020-05-12 MED ORDER — PHENOL 1.4 % MT LIQD: 1.0000 | OROMUCOSAL | Status: DC | PRN

## 2020-05-12 MED ORDER — ONDANSETRON HCL 4 MG/2ML IJ SOLN: 4.0000 mg | Freq: Four times a day (QID) | INTRAMUSCULAR | Status: DC | PRN

## 2020-05-12 MED ORDER — PROPOFOL 1000 MG/100ML IV EMUL
INTRAVENOUS | Status: AC
  Filled 2020-05-12: qty 100

## 2020-05-12 MED ORDER — SENNOSIDES-DOCUSATE SODIUM 8.6-50 MG PO TABS: 1.0000 | ORAL_TABLET | Freq: Every evening | ORAL | Status: DC | PRN

## 2020-05-12 MED ORDER — BUPIVACAINE HCL (PF) 0.25 % IJ SOLN
INTRAMUSCULAR | Status: AC
  Filled 2020-05-12: qty 30

## 2020-05-12 MED ORDER — BUPIVACAINE LIPOSOME 1.3 % IJ SUSP
INTRAMUSCULAR | Status: DC | PRN
  Administered 2020-05-12: 10 mL via PERINEURAL

## 2020-05-12 MED ORDER — ONDANSETRON HCL 4 MG/2ML IJ SOLN
INTRAMUSCULAR | Status: AC
  Filled 2020-05-12: qty 2

## 2020-05-12 MED ORDER — FENTANYL CITRATE (PF) 100 MCG/2ML IJ SOLN
INTRAMUSCULAR | Status: AC
  Filled 2020-05-12: qty 2

## 2020-05-12 MED ORDER — BISACODYL 10 MG RE SUPP: 10.0000 mg | Freq: Every day | RECTAL | Status: DC | PRN

## 2020-05-12 MED ORDER — PHENYLEPHRINE 40 MCG/ML (10ML) SYRINGE FOR IV PUSH (FOR BLOOD PRESSURE SUPPORT)
PREFILLED_SYRINGE | INTRAVENOUS | Status: AC
  Filled 2020-05-12: qty 20

## 2020-05-12 MED ORDER — FENTANYL CITRATE (PF) 100 MCG/2ML IJ SOLN
INTRAMUSCULAR | Status: AC
  Administered 2020-05-12: 100 ug
  Filled 2020-05-12: qty 2

## 2020-05-12 MED ORDER — FENTANYL CITRATE (PF) 100 MCG/2ML IJ SOLN: 50.0000 ug | INTRAMUSCULAR | Status: DC

## 2020-05-12 MED ORDER — DEXAMETHASONE SODIUM PHOSPHATE 10 MG/ML IJ SOLN
INTRAMUSCULAR | Status: AC
  Filled 2020-05-12: qty 1

## 2020-05-12 MED ORDER — BUPIVACAINE HCL (PF) 0.5 % IJ SOLN
INTRAMUSCULAR | Status: DC | PRN
  Administered 2020-05-12: 15 mL via PERINEURAL

## 2020-05-12 MED ORDER — CHLORHEXIDINE GLUCONATE 0.12 % MT SOLN
15.0000 mL | Freq: Once | OROMUCOSAL | Status: AC
  Administered 2020-05-12: 15 mL via OROMUCOSAL

## 2020-05-12 MED ORDER — DOCUSATE SODIUM 100 MG PO CAPS
100.0000 mg | ORAL_CAPSULE | Freq: Two times a day (BID) | ORAL | Status: DC
  Administered 2020-05-12 – 2020-05-15 (×6): 100 mg via ORAL
  Filled 2020-05-12 (×6): qty 1

## 2020-05-12 MED ORDER — PHENYLEPHRINE HCL-NACL 10-0.9 MG/250ML-% IV SOLN
INTRAVENOUS | Status: DC | PRN
  Administered 2020-05-12: 40 ug/min via INTRAVENOUS

## 2020-05-12 MED ORDER — LIDOCAINE 2% (20 MG/ML) 5 ML SYRINGE
INTRAMUSCULAR | Status: DC | PRN
  Administered 2020-05-12: 20 mg via INTRAVENOUS

## 2020-05-12 MED ORDER — METOCLOPRAMIDE HCL 5 MG/ML IJ SOLN: 5.0000 mg | Freq: Three times a day (TID) | INTRAMUSCULAR | Status: DC | PRN

## 2020-05-12 MED ORDER — ACETAMINOPHEN 500 MG PO TABS
1000.0000 mg | ORAL_TABLET | Freq: Four times a day (QID) | ORAL | Status: AC
  Administered 2020-05-12 – 2020-05-13 (×3): 1000 mg via ORAL
  Filled 2020-05-12 (×3): qty 2

## 2020-05-12 MED ORDER — WARFARIN SODIUM 5 MG PO TABS: 7.5000 mg | ORAL_TABLET | Freq: Once | ORAL | Status: DC

## 2020-05-12 MED ORDER — SUGAMMADEX SODIUM 200 MG/2ML IV SOLN
INTRAVENOUS | Status: DC | PRN
  Administered 2020-05-12: 200 mg via INTRAVENOUS

## 2020-05-12 MED ORDER — ROCURONIUM BROMIDE 10 MG/ML (PF) SYRINGE
PREFILLED_SYRINGE | INTRAVENOUS | Status: DC | PRN
  Administered 2020-05-12: 100 mg via INTRAVENOUS

## 2020-05-12 MED ORDER — DEXAMETHASONE SODIUM PHOSPHATE 10 MG/ML IJ SOLN
INTRAMUSCULAR | Status: DC | PRN
  Administered 2020-05-12: 8 mg via INTRAVENOUS

## 2020-05-12 MED ORDER — PHENYLEPHRINE HCL (PRESSORS) 10 MG/ML IV SOLN
INTRAVENOUS | Status: AC
  Filled 2020-05-12: qty 1

## 2020-05-12 MED ORDER — ACETAMINOPHEN 10 MG/ML IV SOLN
INTRAVENOUS | Status: AC
  Filled 2020-05-12: qty 100

## 2020-05-12 MED ORDER — ACETAMINOPHEN 500 MG PO TABS: 1000.0000 mg | ORAL_TABLET | Freq: Once | ORAL | Status: AC

## 2020-05-12 MED ORDER — FENTANYL CITRATE (PF) 100 MCG/2ML IJ SOLN
INTRAMUSCULAR | Status: DC | PRN
  Administered 2020-05-12: 50 ug via INTRAVENOUS

## 2020-05-12 MED ORDER — ACETAMINOPHEN 10 MG/ML IV SOLN
INTRAVENOUS | Status: DC | PRN
  Administered 2020-05-12: 1000 mg via INTRAVENOUS

## 2020-05-12 MED ORDER — HYDROMORPHONE HCL 1 MG/ML IJ SOLN
0.5000 mg | INTRAMUSCULAR | Status: DC | PRN
  Administered 2020-05-13: 1 mg via INTRAVENOUS
  Filled 2020-05-12: qty 1

## 2020-05-12 MED ORDER — ONDANSETRON HCL 4 MG PO TABS: 4.0000 mg | ORAL_TABLET | Freq: Four times a day (QID) | ORAL | Status: DC | PRN

## 2020-05-12 SURGICAL SUPPLY — 55 items
APL PRP STRL LF DISP 70% ISPRP (MISCELLANEOUS) ×1
BIT DRILL CALIBRATED AFFXS 3.3 (DRILL) IMPLANT
BNDG COHESIVE 4X5 TAN STRL (GAUZE/BANDAGES/DRESSINGS) ×2 IMPLANT
BNDG ELASTIC 4X5.8 VLCR STR LF (GAUZE/BANDAGES/DRESSINGS) ×2 IMPLANT
CHLORAPREP W/TINT 26 (MISCELLANEOUS) ×2 IMPLANT
COVER SURGICAL LIGHT HANDLE (MISCELLANEOUS) ×2 IMPLANT
DRAPE C-ARM 42X120 X-RAY (DRAPES) ×2 IMPLANT
DRAPE IMP U-DRAPE 54X76 (DRAPES) ×4 IMPLANT
DRAPE U-SHAPE 47X51 STRL (DRAPES) ×2 IMPLANT
DRILL CALIBRATED AFFIXUS 3.3 (DRILL) ×2
DRSG ADAPTIC 3X8 NADH LF (GAUZE/BANDAGES/DRESSINGS) ×1 IMPLANT
DRSG MEPILEX BORDER 4X8 (GAUZE/BANDAGES/DRESSINGS) ×1 IMPLANT
DRSG TEGADERM 2-3/8X2-3/4 SM (GAUZE/BANDAGES/DRESSINGS) ×1 IMPLANT
ELECT REM PT RETURN 15FT ADLT (MISCELLANEOUS) ×2 IMPLANT
GAUZE SPONGE 2X2 8PLY STRL LF (GAUZE/BANDAGES/DRESSINGS) IMPLANT
GAUZE SPONGE 4X4 12PLY STRL (GAUZE/BANDAGES/DRESSINGS) ×2 IMPLANT
GAUZE XEROFORM 5X9 LF (GAUZE/BANDAGES/DRESSINGS) ×2 IMPLANT
GLOVE BIO SURGEON STRL SZ7.5 (GLOVE) ×4 IMPLANT
GLOVE SRG 8 PF TXTR STRL LF DI (GLOVE) ×1 IMPLANT
GLOVE SURG UNDER POLY LF SZ7.5 (GLOVE) ×2 IMPLANT
GLOVE SURG UNDER POLY LF SZ8 (GLOVE) ×2
GOWN STRL REUS W/ TWL LRG LVL3 (GOWN DISPOSABLE) ×2 IMPLANT
GOWN STRL REUS W/ TWL XL LVL3 (GOWN DISPOSABLE) ×1 IMPLANT
GOWN STRL REUS W/TWL LRG LVL3 (GOWN DISPOSABLE) ×4
GOWN STRL REUS W/TWL XL LVL3 (GOWN DISPOSABLE) ×2
GUIDEWIRE BALL NOSE AFFIXUS (WIRE) ×2 IMPLANT
K-WIRE W/TROCAR TIP 2.5 (WIRE) ×2
KIT BASIN OR (CUSTOM PROCEDURE TRAY) ×2 IMPLANT
KIT TURNOVER KIT A (KITS) IMPLANT
KWIRE W/TROCAR TIP 2.5 (WIRE) IMPLANT
MANIFOLD NEPTUNE II (INSTRUMENTS) ×2 IMPLANT
NAIL PROX HUM AFFIXUS 9X160 RT (Nail) ×1 IMPLANT
NS IRRIG 1000ML POUR BTL (IV SOLUTION) ×2 IMPLANT
PACK ORTHO EXTREMITY (CUSTOM PROCEDURE TRAY) ×1 IMPLANT
PAD ARMBOARD 7.5X6 YLW CONV (MISCELLANEOUS) ×4 IMPLANT
SCREW AFFIXUS BLUNT TIP 4X44 (Screw) ×1 IMPLANT
SCREW BLUNT TIP AFFIXUS 4X40 (Screw) ×1 IMPLANT
SCREW BLUNT TIP AFFIXUS 4X46 (Screw) ×1 IMPLANT
SCREW CORT ANN 4X32 (Screw) ×2 IMPLANT
SPONGE GAUZE 2X2 STER 10/PKG (GAUZE/BANDAGES/DRESSINGS) ×1
SPONGE LAP 18X18 RF (DISPOSABLE) IMPLANT
STAPLER VISISTAT 35W (STAPLE) ×2 IMPLANT
SUCTION FRAZIER HANDLE 10FR (MISCELLANEOUS) ×2
SUCTION TUBE FRAZIER 10FR DISP (MISCELLANEOUS) ×1 IMPLANT
SUT ETHILON 2 0 PS N (SUTURE) ×1 IMPLANT
SUT MAXBRAID #2 CVD NDL (SUTURE) ×1 IMPLANT
SUT MNCRL AB 4-0 PS2 18 (SUTURE) ×2 IMPLANT
SUT MON AB 2-0 CT1 36 (SUTURE) ×2 IMPLANT
SUT VIC AB 0 CT1 27 (SUTURE) ×2
SUT VIC AB 0 CT1 27XBRD ANBCTR (SUTURE) ×1 IMPLANT
TOWEL OR 17X26 10 PK STRL BLUE (TOWEL DISPOSABLE) ×4 IMPLANT
TUBING CONNECTING 10 (TUBING) ×2 IMPLANT
UNDERPAD 30X36 HEAVY ABSORB (UNDERPADS AND DIAPERS) ×2 IMPLANT
WATER STERILE IRR 1000ML POUR (IV SOLUTION) ×2 IMPLANT
YANKAUER SUCT BULB TIP NO VENT (SUCTIONS) ×2 IMPLANT

## 2020-05-12 NOTE — Anesthesia Postprocedure Evaluation (Signed)
Anesthesia Post Note  Patient: Nathan [name]  Procedure(s) Performed: OPEN REDUCTION INTERNAL FIXATION (ORIF) DISTAL HUMERUS FRACTURE (Right )     Patient location during evaluation: PACU Anesthesia Type: Regional and General Level of consciousness: awake and alert Pain management: pain level controlled Vital Signs Assessment: post-procedure vital signs reviewed and stable Respiratory status: spontaneous breathing, nonlabored ventilation and respiratory function stable Cardiovascular status: blood pressure returned to baseline and stable Postop Assessment: no apparent nausea or vomiting Anesthetic complications: no   No complications documented.  Last Vitals:  Vitals:   05/12/20 1300 05/12/20 1313  BP: (!) 116/59 121/69  Pulse: 93 85  Resp: 17 17  Temp: 36.4 C   SpO2: 95% 97%    Last Pain:  Vitals:   05/12/20 1517  TempSrc:   PainSc: 7                  Candra R Rayann Jolley

## 2020-05-12 NOTE — Anesthesia Procedure Notes (Signed)
Anesthesia Regional Block: Interscalene brachial plexus block   Pre-Anesthetic Checklist: ,, timeout performed, Correct Patient, Correct Site, Correct Laterality, Correct Procedure, Correct Position, site marked, Risks and benefits discussed,  Surgical consent,  Pre-op evaluation,  At surgeon's request and post-op pain management  Laterality: Right  Prep: Maximum Sterile Barrier Precautions used, chloraprep       Needles:  Injection technique: Single-shot  Needle Type: Echogenic Stimulator Needle     Needle Length: 9cm  Needle Gauge: 22     Additional Needles:   Procedures:,,,, ultrasound used (permanent image in chart),,,,  Narrative:  Start time: 05/12/2020 9:45 AM End time: 05/12/2020 9:55 AM Injection made incrementally with aspirations every 5 mL.  Performed by: Personally  Anesthesiologist: Freddrick March, MD  Additional Notes: Monitors applied. No increased pain on injection. No increased resistance to injection. Injection made in 5cc increments. Good needle visualization. Patient tolerated procedure well.

## 2020-05-12 NOTE — Discharge Instructions (Signed)
Maintain sling until follow up. May remove when seated to allow for moving the ELBOW.   Diet: As you were doing prior to hospitalization   Dressing:  Keep dressings on and dry.  You may remove dressings in 3 days and shower over incisions.  No Bath / submerging incisions.  Cover with clean Band-Aid.  Activity:  Increase activity slowly as tolerated, but follow the weight bearing instructions below.  The rules on driving is that you can not be taking narcotics while you drive, and you must feel in control of the vehicle.    Weight Bearing:  Do not lift up to 5 lbs (cup of coffee for example) to the wrist. Do not lift anything with the shoulder.  You may straighten and bend arm at the elbow.  Pain:  For severe pain, you may increase breakthrough pain medication (oxycodone) for the first few days post op to 2 tablets every 4 hours.  Stop this medication as soon as you are able.  Constipation: Narcotic pain medications cause constipation.  Reduce use or stop taking if you become constipated.  Drink plenty of fluids (prune juice may be helpful) and high fiber foods.  You may use a stool softener such as -  Colace (over the counter) 100 mg by mouth twice a day  And/or Miralax (over the counter) for constipation as needed.    Itching:  If you experience itching with your medications, try taking only a single pain pill, or even half a pain pill at a time.  You can also use benadryl over the counter for itching or also to help with sleep.   Precautions:  If you experience chest pain or shortness of breath - call 911 immediately for transfer to the hospital emergency department!!  If you develop a fever greater that 101 F, purulent drainage from wound, increased redness or drainage from wound, or calf pain -- Call the office at [phone]                                                 Follow- Up Appointment:  Please call for an appointment to be seen in 2 weeks [place] - [phone]

## 2020-05-12 NOTE — Interval H&P Note (Signed)
I participated in the care of this patient and agree with the above history, physical and evaluation. I performed a review of the history and a physical exam as detailed   Mairely Foxworth Daniel Paxten Appelt MD  

## 2020-05-12 NOTE — Transfer of Care (Signed)
Immediate Anesthesia Transfer of Care Note  Patient: Nathan [name]  Procedure(s) Performed: OPEN REDUCTION INTERNAL FIXATION (ORIF) DISTAL HUMERUS FRACTURE (Right )  Patient Location: PACU  Anesthesia Type:General  Level of Consciousness: awake, alert  and oriented  Airway & Oxygen Therapy: Patient Spontanous Breathing and Patient connected to face mask oxygen  Post-op Assessment: Report given to RN and Post -op Vital signs reviewed and stable  Post vital signs: Reviewed and stable  Last Vitals:  Vitals Value Taken Time  BP    Temp    Pulse    Resp    SpO2      Last Pain:  Vitals:   05/12/20 0934  TempSrc:   PainSc: 0-No pain      Patients Stated Pain Goal: 3 (49/70/26 3785)  Complications: No complications documented.

## 2020-05-12 NOTE — Progress Notes (Signed)
PROGRESS NOTE  Nathan [name]  DOB: May 27, 1940  PCP: Janith Lima, MD KGY:[id number]  DOA: 05/10/2020  LOS: 2 days   Chief Complaint  Patient presents with   Fall  Coumadin    Level2   Brief narrative: Nathan [name] is a 80 y.o. male with PMH significant for history of atrial fibrillation on anticoagulation, hypertension. Patient presented to the ED on 1/23 after a fall impacting his right shoulder on the ground. X-ray showed proximal right humerus fracture, no acute lung process CT right shoulder showed a comminuted fracture of proximal humeral metadiaphysis with displacement, angulation, and override.  Greater tuberosity fracture associated. Admitted under hospitalist service Orthopedic consultation obtained.  Subjective: Patient was seen and examined this afternoon.  Underwent ORIF of right humerus this morning. Pain controlled.  Not in distress. Remains in A. fib, rate controlled.  Blood pressure better.  Assessment/Plan: Comminuted fracture of right humerus -After mechanical fall. -Orthopedic consulted.  Underwent ORIF of right humerus this morning. -Currently pain controlled on morphine as needed and oxycodone as needed.  Impaired mobility -Uses a walker at home. -PT to evaluate post procedure.  Afib -On long-term anticoagulation with Coumadin.   -INR was subtherapeutic this morning.  Per orthopedics, Coumadin to resume tomorrow. -Continue diltiazem.  Metoprolol held because of low blood pressure yesterday.  HTN -Currently blood pressure controlled on Cardizem.   -On hold on metoprolol, Lasix and lisinopril.  Continue to monitor.    AKI -Creatinine was elevated to 2.01 on admission.  Elevated but remained stable for now. -Lasix and lisinopril on hold. -Continue to monitor. Recent Labs    05/10/20 0350 05/11/20 0521 05/12/20 0518  BUN 45* 42* 49*  CREATININE 2.01* 1.86* 2.03*   Mild protein calorie malnutrition -Secondary to poor appetite, poor p.o.  intake -Discussed the importance of nutrient dense food choices  Mobility: PT eval postprocedure Code Status:   Code Status: Full Code  Nutritional status: Body mass index is 29.95 kg/m.     Diet Order            Diet Heart Room service appropriate? Yes; Fluid consistency: Thin  Diet effective now                 DVT prophylaxis: SCDs Start: 05/10/20 2209   Antimicrobials:  None Fluid: None Consultants: Orthopedics Family Communication:  None at bedside  Status is: Inpatient  Remains inpatient appropriate because: Pending surgical fixation  Dispo: The patient is from: Home              Anticipated d/c is to: Home versus rehab depending on PT recommendation              Anticipated d/c date is: 3 days              Patient currently is not medically stable to d/c.   Difficult to place patient No       Infusions:   sodium chloride 100 mL/hr at 05/12/20 1347    Scheduled Meds:  diltiazem  120 mg Oral Daily   finasteride  5 mg Oral Daily   lisinopril  40 mg Oral Daily   pantoprazole  40 mg Oral Daily   Warfarin - Pharmacist Dosing Inpatient   Does not apply q1600    Antimicrobials: Anti-infectives (From admission, onward)   Start     Dose/Rate Route Frequency Ordered Stop   05/12/20 0600  ceFAZolin (ANCEF) IVPB 2g/100 mL premix        2 g 200  mL/hr over 30 Minutes Intravenous On call to O.R. 05/11/20 2325 05/12/20 1039      PRN meds: acetaminophen **OR** acetaminophen, alum & mag hydroxide-simeth, morphine injection, oxyCODONE, polyethylene glycol   Objective: Vitals:   05/12/20 1300 05/12/20 1313  BP: (!) 116/59 121/69  Pulse: 93 85  Resp: 17 17  Temp: 97.6 F (36.4 C)   SpO2: 95% 97%    Intake/Output Summary (Last 24 hours) at 05/12/2020 1406 Last data filed at 05/12/2020 1215 Gross per 24 hour  Intake 3313.72 ml  Output 1110 ml  Net 2203.72 ml   Filed Weights   05/10/20 0349  Weight: 92 kg   Weight change:  Body mass index is  29.95 kg/m.   Physical Exam: General exam: Pleasant, elderly Caucasian male.  Not in distress Skin: No rashes, lesions or ulcers. HEENT: Atraumatic, normocephalic, no obvious bleeding Lungs: Clear to auscultation bilaterally CVS: Regular rate and rhythm, no murmur GI/Abd soft, nontender, nondistended, bowel sound present CNS: Alert, awake, oriented x3 Psychiatry: Mood appropriate Extremities: Right shoulder in sling.  No pedal edema, no calf tenderness  Data Review: I have personally reviewed the laboratory data and studies available.  Recent Labs  Lab 05/10/20 0350 05/11/20 0521 05/12/20 0518  WBC 9.5 4.9 6.0  NEUTROABS 8.0* 3.0 3.9  HGB 12.0* 10.2* 9.7*  HCT 37.5* 32.5* 31.1*  MCV 97.9 97.0 98.1  PLT 244 209 206   Recent Labs  Lab 05/10/20 0350 05/11/20 0521 05/12/20 0518  NA 141 141 141  K 4.6 3.8 3.7  CL 105 108 108  CO2 25 23 23   GLUCOSE 129* 97 82  BUN 45* 42* 49*  CREATININE 2.01* 1.86* 2.03*  CALCIUM 9.2 8.4* 8.0*  MG  --  2.1  --     F/u labs ordered  Signed, [name], MD [facility] 05/12/2020

## 2020-05-12 NOTE — Progress Notes (Signed)
Assisted Dr. Chelsey Woodrum with right, ultrasound guided, interscalene  block. Side rails up, monitors on throughout procedure. See vital signs in flow sheet. Tolerated Procedure well.  

## 2020-05-12 NOTE — Anesthesia Procedure Notes (Signed)
Procedure Name: Intubation Date/Time: 05/12/2020 10:09 AM Performed by: Niel Hummer, CRNA Pre-anesthesia Checklist: Patient identified, Emergency Drugs available, Suction available and Patient being monitored Patient Re-evaluated:Patient Re-evaluated prior to induction Oxygen Delivery Method: Circle system utilized Preoxygenation: Pre-oxygenation with 100% oxygen Induction Type: IV induction Ventilation: Mask ventilation without difficulty Laryngoscope Size: Mac and 4 Grade View: Grade I Tube type: Oral Tube size: 7.5 mm Number of attempts: 1 Airway Equipment and Method: Stylet Placement Confirmation: ETT inserted through vocal cords under direct vision,  positive ETCO2 and breath sounds checked- equal and bilateral Secured at: 22 cm Tube secured with: Tape Dental Injury: Teeth and Oropharynx as per pre-operative assessment

## 2020-05-12 NOTE — Op Note (Signed)
05/10/2020 - 05/12/2020  9:59 AM  PATIENT:  Nathan [name]    PRE-OPERATIVE DIAGNOSIS:  right humerus fracture  POST-OPERATIVE DIAGNOSIS:  Same  PROCEDURE:  OPEN REDUCTION INTERNAL FIXATION (ORIF) DISTAL HUMERUS FRACTURE  SURGEON:  [name], MD  PHYSICIAN ASSISTANT: Margy Clarks, PA-C, he was present and scrubbed throughout the case, critical for completion in a timely fashion, and for retraction, instrumentation, and closure.   ANESTHESIA:   General  PREOPERATIVE INDICATIONS:  Nathan [name] is a  80 y.o. male with a diagnosis of right humerus fracture who elected for surgical management.    The risks benefits and alternatives were discussed with the patient including but not limited to the risks of nonoperative treatment, versus surgical intervention including infection, bleeding, nerve injury, malunion, nonunion, the need for revision surgery, hardware prominence, hardware failure, the need for hardware removal, blood clots, cardiopulmonary complications, conversion to arthroplasty, morbidity, mortality, among others, and they were willing to proceed.  Predicted outcome is good, although there will be at least a six to nine month expected recovery.    OPERATIVE IMPLANTS: Biomet Humeral Nail [id number]  OPERATIVE FINDINGS: Displaced humerus fracture  OPERATIVE PROCEDURE: The patient was brought to the operating room and placed in the supine position. General anesthesia was administered. IV antibiotics were given. He was placed in the beach chair position. All bony prominences were padded. The upper extremity was prepped and draped in usual sterile fashion.    I made an oblique incision off the anterior corner of the acromion dissected down to the deltoid fascia.  I then split the deltoid fibers in line.  Next I identified the rotator cuff and split this in line with its fibers.  I then used a guidepin to identify the starting spot for the nail this was passed into the  proximal humerus multiple x-rays of the shoulder were performed I reviewed the guidepin location was happy with this.  Next I opened up the entry spot with an entry reamer.  I then reduced the fracture and passed either the nail or ball-tipped guidewire as needed.  Nail was then seated well under fluoroscopic images.  I took fluoroscopic x-rays of the humerus and was happy with the reduction and nail placement I then placed proximal and distal interlock screws I took x-rays of the shoulder as well as the humerus and was happy with the fracture reduction and placement of all hardware.  I then thoroughly irrigated the incisions these the cuff was repaired using a FiberWire stitch and all incisions were closed in layers.   He was placed in a sling. He tolerated the procedure well with no complications.   POST OPERATIVE PLAN: Sling full time, DVT px: Ambulation and foot pumps

## 2020-05-12 NOTE — Anesthesia Preprocedure Evaluation (Addendum)
Anesthesia Evaluation  Patient identified by MRN, date of birth, ID band Patient awake    Reviewed: Allergy & Precautions, NPO status , Patient's Chart, lab work & pertinent test results, reviewed documented beta blocker date and time   Airway Mallampati: II  TM Distance: >3 FB Neck ROM: Full    Dental  (+) Poor Dentition, Missing, Dental Advisory Given, Chipped,    Pulmonary neg pulmonary ROS,    Pulmonary exam normal breath sounds clear to auscultation       Cardiovascular hypertension, Pt. on home beta blockers and Pt. on medications Normal cardiovascular exam+ dysrhythmias Atrial Fibrillation  Rhythm:Regular Rate:Normal     Neuro/Psych negative neurological ROS  negative psych ROS   GI/Hepatic negative GI ROS, Neg liver ROS,   Endo/Other  negative endocrine ROS  Renal/GU Renal disease (Cr 2.03, K 3.7)  negative genitourinary   Musculoskeletal negative musculoskeletal ROS (+)   Abdominal   Peds  Hematology  (+) Blood dyscrasia (Hgb 9.7, on coumadin), anemia ,   Anesthesia Other Findings   Reproductive/Obstetrics                            Anesthesia Physical Anesthesia Plan  ASA: III  Anesthesia Plan: Regional and General   Post-op Pain Management:  Regional for Post-op pain   Induction: Intravenous  PONV Risk Score and Plan: 2 and Propofol infusion, Treatment may vary due to age or medical condition, Ondansetron and Dexamethasone  Airway Management Planned: Oral ETT  Additional Equipment:   Intra-op Plan:   Post-operative Plan: Extubation in OR  Informed Consent: I have reviewed the patients History and Physical, chart, labs and discussed the procedure including the risks, benefits and alternatives for the proposed anesthesia with the patient or authorized representative who has indicated his/her understanding and acceptance.     Dental advisory given  Plan Discussed  with: CRNA  Anesthesia Plan Comments:       Anesthesia Quick Evaluation

## 2020-05-12 NOTE — Progress Notes (Addendum)
ANTICOAGULATION CONSULT NOTE   Pharmacy Consult for warfarin Indication: atrial fibrillation  No Known Allergies  Patient Measurements: Height: 5\' 9"  (175.3 cm) Weight: 92 kg (202 lb 13.2 oz) IBW/kg (Calculated) : 70.7  Vital Signs: Temp: 98.2 F (36.8 C) (01/25 0553) Temp Source: Oral (01/25 0553) BP: 144/81 (01/25 0553) Pulse Rate: 88 (01/25 0553)  Labs: Recent Labs    05/10/20 0350 05/11/20 0521 05/12/20 0518  HGB 12.0* 10.2* 9.7*  HCT 37.5* 32.5* 31.1*  PLT 244 209 206  LABPROT 29.6* 24.7* 21.0*  INR 2.9* 2.3* 1.9*  CREATININE 2.01* 1.86* 2.03*    Estimated Creatinine Clearance: 31.4 mL/min (A) (by C-G formula based on SCr of 2.03 mg/dL (H)).   Medical History: Past Medical History:  Diagnosis Date  . Hypertension    Assessment: 56 YOM presenting with fall, hx of afib on warfarin PTA.  INR on admission 2.9 (last dose 1/22), no reported head injury however R-humerus fx and to hold warfarin for possible surgical intervention.   PTA dosing: 5mg  daily  05/12/2020  INR 1.9 today- slightly sub-therapeutic after held x 2 days for OR Hg 9.7 (down trend) , PLT WNL, no bleeding reported  ORIF R humerus today @ 1030 Warfarin to resume tomorrow morning per ortho PA, Margy Clarks  Goal of Therapy:  INR 2-3 Monitor platelets by anticoagulation protocol: Yes   Plan:  No coumadin today per Ortho request> will start tomorrow Daily INR F/u CBC IV PPI> PO per protocol  Eudelia Bunch, Pharm.D 05/12/2020 8:34 AM

## 2020-05-13 ENCOUNTER — Encounter (HOSPITAL_COMMUNITY): Payer: Self-pay | Admitting: Orthopedic Surgery

## 2020-05-13 DIAGNOSIS — S42351A Displaced comminuted fracture of shaft of humerus, right arm, initial encounter for closed fracture: Secondary | ICD-10-CM | POA: Diagnosis not present

## 2020-05-13 LAB — CBC WITH DIFFERENTIAL/PLATELET
Abs Immature Granulocytes: 0.05 10*3/uL (ref 0.00–0.07)
Basophils Absolute: 0 10*3/uL (ref 0.0–0.1)
Basophils Relative: 0 %
Eosinophils Absolute: 0 10*3/uL (ref 0.0–0.5)
Eosinophils Relative: 0 %
HCT: 34.6 % — ABNORMAL LOW (ref 39.0–52.0)
Hemoglobin: 10.7 g/dL — ABNORMAL LOW (ref 13.0–17.0)
Immature Granulocytes: 1 %
Lymphocytes Relative: 5 %
Lymphs Abs: 0.4 10*3/uL — ABNORMAL LOW (ref 0.7–4.0)
MCH: 30.9 pg (ref 26.0–34.0)
MCHC: 30.9 g/dL (ref 30.0–36.0)
MCV: 100 fL (ref 80.0–100.0)
Monocytes Absolute: 0.5 10*3/uL (ref 0.1–1.0)
Monocytes Relative: 6 %
Neutro Abs: 7.2 10*3/uL (ref 1.7–7.7)
Neutrophils Relative %: 88 %
Platelets: 238 10*3/uL (ref 150–400)
RBC: 3.46 MIL/uL — ABNORMAL LOW (ref 4.22–5.81)
RDW: 15.4 % (ref 11.5–15.5)
WBC: 8.2 10*3/uL (ref 4.0–10.5)
nRBC: 0 % (ref 0.0–0.2)

## 2020-05-13 LAB — BASIC METABOLIC PANEL
Anion gap: 12 (ref 5–15)
BUN: 42 mg/dL — ABNORMAL HIGH (ref 8–23)
CO2: 20 mmol/L — ABNORMAL LOW (ref 22–32)
Calcium: 7.9 mg/dL — ABNORMAL LOW (ref 8.9–10.3)
Chloride: 111 mmol/L (ref 98–111)
Creatinine, Ser: 1.92 mg/dL — ABNORMAL HIGH (ref 0.61–1.24)
GFR, Estimated: 34 mL/min — ABNORMAL LOW (ref >60)
Glucose, Bld: 108 mg/dL — ABNORMAL HIGH (ref 70–99)
Potassium: 4.1 mmol/L (ref 3.5–5.1)
Sodium: 143 mmol/L (ref 135–145)

## 2020-05-13 LAB — PROTIME-INR
INR: 1.6 — ABNORMAL HIGH (ref 0.8–1.2)
Prothrombin Time: 18.8 seconds — ABNORMAL HIGH (ref 11.4–15.2)

## 2020-05-13 MED ORDER — WARFARIN SODIUM 5 MG PO TABS
7.5000 mg | ORAL_TABLET | Freq: Once | ORAL | Status: AC
  Administered 2020-05-13: 7.5 mg via ORAL
  Filled 2020-05-13: qty 1

## 2020-05-13 MED ORDER — INFLUENZA VAC A&B SA ADJ QUAD 0.5 ML IM PRSY
0.5000 mL | PREFILLED_SYRINGE | INTRAMUSCULAR | Status: DC
  Filled 2020-05-13: qty 0.5

## 2020-05-13 MED ORDER — METOPROLOL TARTRATE 25 MG PO TABS
25.0000 mg | ORAL_TABLET | Freq: Two times a day (BID) | ORAL | Status: DC
  Administered 2020-05-13 – 2020-05-14 (×4): 25 mg via ORAL
  Filled 2020-05-13 (×4): qty 1

## 2020-05-13 MED ORDER — ENOXAPARIN SODIUM 40 MG/0.4ML ~~LOC~~ SOLN
40.0000 mg | SUBCUTANEOUS | Status: DC
  Administered 2020-05-13 – 2020-05-15 (×3): 40 mg via SUBCUTANEOUS
  Filled 2020-05-13 (×3): qty 0.4

## 2020-05-13 MED ORDER — WARFARIN SODIUM 5 MG PO TABS
5.0000 mg | ORAL_TABLET | Freq: Every day | ORAL | Status: DC
Start: 2020-05-13 — End: 2020-05-13

## 2020-05-13 MED ORDER — HYDRALAZINE HCL 20 MG/ML IJ SOLN
5.0000 mg | Freq: Once | INTRAMUSCULAR | Status: AC
  Administered 2020-05-13: 5 mg via INTRAVENOUS
  Filled 2020-05-13: qty 1

## 2020-05-13 NOTE — NC FL2 (Addendum)
[place] LEVEL OF CARE SCREENING TOOL     IDENTIFICATION  Patient Name: Nathan [name] Birthdate: Aug 31, 1940 Sex: male Admission Date (Current Location): 05/10/2020  [facility] and [place] Number:  Herbalist and Address:  [facility],  [place] [address], [place]      Provider Number: [id number]  Attending Physician Name and Address:  [name], MD  Relative Name and Phone Number:    DINA, MOBLEY Son [phone]   RANVEER, WAHLSTROM [phone]        Current Level of Care: Hospital Recommended Level of Care: [facility] Prior Approval Number:    Date Approved/Denied:   PASRR Number:   [id number]  Discharge Plan: SNF    Current Diagnoses: Patient Active Problem List   Diagnosis Date Noted  . Comminuted fracture of right humerus 05/10/2020  . Fall at home, initial encounter 05/10/2020  . A-fib ([place]) 05/10/2020  . Elevated serum creatinine 05/10/2020    Orientation RESPIRATION BLADDER Height & Weight     Self,Place  Normal Continent Weight: 202 lb 13.2 oz (92 kg) Height:  5\' 9"  (175.3 cm)  BEHAVIORAL SYMPTOMS/MOOD NEUROLOGICAL BOWEL NUTRITION STATUS      Continent Diet Low sodium Heart Healthy   AMBULATORY STATUS COMMUNICATION OF NEEDS Skin   Extensive Assist Verbally Surgical wounds  Incision Right Arm                     Personal Care Assistance Level of Assistance  Bathing,Feeding,Dressing Bathing Assistance: Maximum assistance Feeding assistance:  Limited Independent Dressing Assistance: Maximum assistance     Functional Limitations Info  Sight,Hearing,Speech Sight Info: Impaired Hearing Info: Impaired Speech Info: Adequate    SPECIAL CARE FACTORS FREQUENCY  PT (By licensed PT),OT (By licensed OT)     PT Frequency: 5x/week OT Frequency: 5x/week            Contractures Contractures Info: Not present    Additional Factors Info  Code Status,Allergies Code Status Info:  Fullcode Allergies Info: Allergies: No Known Allergies           Current Medications (05/13/2020):  This is the current hospital active medication list Current Facility-Administered Medications  Medication Dose Route Frequency Provider Last Rate Last Admin  . acetaminophen (TYLENOL) tablet 650 mg  650 mg Oral Q6H PRN Zierle-Ghosh, Asia B, DO   650 mg at 05/13/20 0402   Or  . acetaminophen (TYLENOL) suppository 650 mg  650 mg Rectal Q6H PRN Zierle-Ghosh, Asia B, DO      . acetaminophen (TYLENOL) tablet 1,000 mg  1,000 mg Oral Q6H Margy Clarks M, PA-C   1,000 mg at 05/13/20 1120  . alum & mag hydroxide-simeth (MAALOX/MYLANTA) 200-200-20 MG/5ML suspension 15 mL  15 mL Oral Q6H PRN Dahal, Marlowe Aschoff, MD   15 mL at 05/11/20 1458  . bisacodyl (DULCOLAX) suppository 10 mg  10 mg Rectal Daily PRN Margy Clarks M, PA-C      . diltiazem (CARDIZEM CD) 24 hr capsule 120 mg  120 mg Oral Daily Zierle-Ghosh, Asia B, DO   120 mg at 05/13/20 0857  . docusate sodium (COLACE) capsule 100 mg  100 mg Oral BID Margy Clarks M, PA-C   100 mg at 05/13/20 3474  . enoxaparin (LOVENOX) injection 40 mg  40 mg Subcutaneous Q24H Margy Clarks M, PA-C   40 mg at 05/13/20 1040  . fentaNYL (SUBLIMAZE) injection 50-100 mcg  50-100 mcg Intravenous UD Woodrum, Carlynn Herald, MD      .  finasteride (PROSCAR) tablet 5 mg  5 mg Oral Daily Zierle-Ghosh, Asia B, DO   5 mg at 05/13/20 0858  . HYDROmorphone (DILAUDID) injection 0.5-1 mg  0.5-1 mg Intravenous Q4H PRN Margy Clarks M, PA-C      . [START ON 05/14/2020] influenza vaccine adjuvanted (FLUAD) injection 0.5 mL  0.5 mL Intramuscular Tomorrow-1000 Dahal, Binaya, MD      . lisinopril (ZESTRIL) tablet 40 mg  40 mg Oral Daily Zierle-Ghosh, Asia B, DO   40 mg at 05/13/20 0857  . menthol-cetylpyridinium (CEPACOL) lozenge 3 mg  1 lozenge Oral PRN Margy Clarks M, PA-C       Or  . phenol (CHLORASEPTIC) mouth spray 1 spray  1 spray Mouth/Throat PRN Margy Clarks M, PA-C      .  metoCLOPramide (REGLAN) tablet 5-10 mg  5-10 mg Oral Q8H PRN Margy Clarks M, PA-C       Or  . metoCLOPramide (REGLAN) injection 5-10 mg  5-10 mg Intravenous Q8H PRN Margy Clarks M, PA-C      . metoprolol tartrate (LOPRESSOR) tablet 25 mg  25 mg Oral BID [name], MD   25 mg at 05/13/20 0858  . morphine 2 MG/ML injection 1 mg  1 mg Intravenous Q4H PRN Dahal, Marlowe Aschoff, MD      . ondansetron (ZOFRAN) tablet 4 mg  4 mg Oral Q6H PRN Margy Clarks M, PA-C       Or  . ondansetron [facility]) injection 4 mg  4 mg Intravenous Q6H PRN Margy Clarks M, PA-C      . oxyCODONE (Oxy IR/ROXICODONE) immediate release tablet 10-15 mg  10-15 mg Oral Q4H PRN Margy Clarks M, PA-C      . oxyCODONE (Oxy IR/ROXICODONE) immediate release tablet 5 mg  5 mg Oral Q4H PRN Zierle-Ghosh, Asia B, DO   5 mg at 05/12/20 1517  . oxyCODONE (Oxy IR/ROXICODONE) immediate release tablet 5-10 mg  5-10 mg Oral Q4H PRN Margy Clarks M, PA-C      . pantoprazole (PROTONIX) EC tablet 40 mg  40 mg Oral Daily Leodis Sias T, RPH   40 mg at 05/13/20 0858  . polyethylene glycol (MIRALAX / GLYCOLAX) packet 17 g  17 g Oral Daily PRN Zierle-Ghosh, Asia B, DO      . senna-docusate (Senokot-S) tablet 1 tablet  1 tablet Oral QHS PRN Rachael Fee, PA-C      . Warfarin - Pharmacist Dosing Inpatient   Does not apply q1600 Eudelia Bunch, [facility]         Discharge Medications: Please see discharge summary for a list of discharge medications.  Relevant Imaging Results:  Relevant Lab Results:   Additional Information CMK:[id number]  Lia Hopping, LCSW

## 2020-05-13 NOTE — Evaluation (Signed)
Occupational Therapy Evaluation Patient Details Name: Nathan [name] MRN: [id number] DOB: 1940/09/13 Today's Date: 05/13/2020    History of Present Illness Nathan [name] is a 80 y.o. male with PMH significant for history of atrial fibrillation on anticoagulation, hypertension.  Patient presented to the ED on 1/23 after a fall impacting his right shoulder on the ground. Patient now s/p ORIF right humerus 1/25.   Clinical Impression   Nathan [name] is an 80 year old male s/p ORIF of right humerus supine in bed and in sling. Patient NWB in RUE and AROM/PROM of shoulder not allowed. Patient normally lives alone and independent with BADLs and uses a walker for ambulation. Today patient max assist for supine to sit and mod assist for squat pivot transfer to recliner after significant difficulty standing and inability to take a step. Patient's balance impaired in sitting and standing. Patient soaked in urine due to failure of external catheter device and needed mod -max assist for UB and LB bathing, max assist for UB dressing and total assist for LB dressing. Patient alert and mostly oriented but [facility] but exhibits some difficulty following directions, continues to attempt to use RUE despite immobilization and verbal cues and short term memory deficits. Patient will benefit from skilled OT services while in hospital to improve deficits and learn compensatory strategies as needed in order to return to PLOF as well as instruction and education to maintain shoulder precautions. Recommend short term rehab at discharge.      Follow Up Recommendations  SNF    Equipment Recommendations  3 in 1 bedside commode;Tub/shower seat    Recommendations for Other Services       Precautions / Restrictions Precautions Precautions: Shoulder Type of Shoulder Precautions: No AROM, No PROM Shoulder Interventions: Shoulder sling/immobilizer;At all times;Off for dressing/bathing/exercises Precaution Booklet Issued: Yes  (comment) (handout provided) Restrictions Weight Bearing Restrictions: Yes RUE Weight Bearing: Non weight bearing Other Position/Activity Restrictions: May be bear 5 lbs to elbow      Mobility Bed Mobility Overal bed mobility: Needs Assistance Bed Mobility: Supine to Sit     Supine to sit: Max assist;HOB elevated     General bed mobility comments: Assistance for LEs and heavy max assist for trunk negotiation and use of bed pad to transfer to side of bed. 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    Transfers Overall transfer level: Needs assistance Equipment used: None Transfers: Sit to/from [employer] Sit to Stand: Max assist;From elevated surface         General transfer comment: Max assist to stand from elevated surface with one hand hold at side of bed. Patient unable to come into full erect standing. Patient unable to take a step and attempting to use RUE with attempt to pivot towards the right. Returned to seated position. Performed squat pivot with mod assist to the recliner positioned on his left.    Balance Overall balance assessment: Needs assistance Sitting-balance support: Single extremity supported Sitting balance-Leahy Scale: Poor Sitting balance - Comments: leaning to the left or supporting himself anteriorly on his knees   Standing balance support: Single extremity supported Standing balance-Leahy Scale: Poor                             ADL either performed or assessed with clinical judgement   ADL Overall ADL's : Needs assistance/impaired Eating/Feeding: Set up;Sitting   Grooming: Set up;Sitting  Upper Body Bathing: Moderate assistance;Sitting Upper Body Bathing Details (indicate cue type and reason): partial bathing at side of bed. Lower Body Bathing: Maximal assistance;Sit to/from stand;Set up Lower Body Bathing Details (indicate cue type and reason): needed assistance with lower  legs, posterior side of body and washing buttocks. Upper Body Dressing : Maximal assistance;Sitting;Cueing for UE precautions Upper Body Dressing Details (indicate cue type and reason): needed assistance to don hospital gown and sling. Lower Body Dressing: Total assistance;Sit to/from stand Lower Body Dressing Details (indicate cue type and reason): Total assistance to don socks. Toilet Transfer: Squat-pivot;BSC;Moderate Print production planner Details (indicate cue type and reason): mod assist to pivot Toileting- Clothing Manipulation and Hygiene: Maximal assistance;Sitting/lateral lean Toileting - Clothing Manipulation Details (indicate cue type and reason): able to assist with hygiene in seated position.             Vision Patient Visual Report: No change from baseline       Perception     Praxis      Pertinent Vitals/Pain Pain Assessment: Faces Faces Pain Scale: Hurts little more Pain Location: low back (with transfers) Pain Descriptors / Indicators: Grimacing;Moaning Pain Intervention(s): Limited activity within patient's tolerance;Monitored during session     Hand Dominance Right   Extremity/Trunk Assessment Upper Extremity Assessment Upper Extremity Assessment: RUE deficits/detail;LUE deficits/detail RUE Deficits / Details: R shoulder not assessed. Elbow, wrist, and fingers WFL ROM. LUE Deficits / Details: WFL ROM and strength. Crepitus noted in left shoulder. Arthritic changes in hand.   Lower Extremity Assessment Lower Extremity Assessment: Defer to PT evaluation   Cervical / Trunk Assessment Cervical / Trunk Assessment: Kyphotic   Communication Communication Communication: HOH   Cognition Arousal/Alertness: Awake/alert Behavior During Therapy: WFL for tasks assessed/performed Overall Cognitive Status: Within Functional Limits for tasks assessed                                 General Comments: Alert to self, situation, month and  hospital. Incorrect date and year. Answers most questions appropriately - limited by [facility]. Needs constant reminders to not use RUE.   General Comments       Exercises     Shoulder Instructions      Home Living Family/patient expects to be discharged to:: Private residence Living Arrangements: Alone Available Help at Discharge: Family;Available PRN/intermittently Type of Home: House Home Access: Stairs to enter [employer] of Steps: 4-5 Entrance Stairs-Rails: Right;Left Home Layout: One level               Home Equipment: Walker - 2 wheels;Cane - single point          Prior Functioning/Environment Level of Independence: Independent                 OT Problem List: Decreased strength;Decreased range of motion;Decreased activity tolerance;Impaired balance (sitting and/or standing);Decreased cognition;Decreased safety awareness;Decreased knowledge of use of DME or AE;Decreased knowledge of precautions;Pain;Impaired UE functional use      OT Treatment/Interventions: Self-care/ADL training;Therapeutic exercise;DME and/or AE instruction;Therapeutic activities;Balance training;Patient/family education    OT Goals(Current goals can be found in the care plan section) Acute Rehab OT Goals Patient Stated Goal: To get moving better OT Goal Formulation: With patient Time For Goal Achievement: 05/27/20 Potential to Achieve Goals: Good  OT Frequency: Min 2X/week   Barriers to D/C:            Co-evaluation  AM-PAC OT "6 Clicks" Daily Activity     Outcome Measure Help from another person eating meals?: A Little Help from another person taking care of personal grooming?: A Little Help from another person toileting, which includes using toliet, bedpan, or urinal?: A Lot Help from another person bathing (including washing, rinsing, drying)?: A Lot Help from another person to put on and taking off regular upper body clothing?: A Lot Help from  another person to put on and taking off regular lower body clothing?: Total 6 Click Score: 13   End of Session Equipment Utilized During Treatment: Gait belt Nurse Communication: Mobility status  Activity Tolerance: Patient limited by fatigue Patient left: in chair;with call bell/phone within reach;with chair alarm set  OT Visit Diagnosis: Unsteadiness on feet (R26.81);Other abnormalities of gait and mobility (R26.89);History of falling (Z91.81);Pain                Time: 4944-9675 OT Time Calculation (min): 44 min Charges:  OT General Charges $OT Visit: 1 Visit OT Evaluation $OT Eval Moderate Complexity: 1 Mod OT Treatments $Self Care/Home Management : 23-37 mins  Jerzy Roepke, OTR/L [place]  Office [phone] Pager: [place] 05/13/2020, 9:26 AM

## 2020-05-13 NOTE — Progress Notes (Signed)
ANTICOAGULATION CONSULT NOTE   Pharmacy Consult for warfarin Indication: atrial fibrillation  No Known Allergies  Patient Measurements: Height: 5\' 9"  (175.3 cm) Weight: 92 kg (202 lb 13.2 oz) IBW/kg (Calculated) : 70.7  Vital Signs: Temp: 97.8 F (36.6 C) (01/26 0628) Temp Source: Oral (01/26 0628) BP: 156/91 (01/26 0628) Pulse Rate: 82 (01/26 0628)  Labs: Recent Labs    05/11/20 0521 05/12/20 0518 05/13/20 0528  HGB 10.2* 9.7*  --   HCT 32.5* 31.1*  --   PLT 209 206  --   LABPROT 24.7* 21.0* 18.8*  INR 2.3* 1.9* 1.6*  CREATININE 1.86* 2.03*  --     Estimated Creatinine Clearance: 31.4 mL/min (A) (by C-G formula based on SCr of 2.03 mg/dL (H)).   Medical History: Past Medical History:  Diagnosis Date  . Hypertension    Assessment: 76 YOM presenting with fall, hx of afib on warfarin PTA.  INR on admission 2.9 (last dose 1/22), no reported head injury however pt underwent ORIF R humerus surgery on 1/25.   PTA dosing: 5mg  daily  05/13/2020  INR 1.6 today- sub-therapeutic after held x 3 days for OR 1 day s/p ORIF R humerus  1/25: Hgb 9.7 (down trend) , PLT WNL, no bleeding reported Warfarin to resume today per ortho PA, Margy Clarks  Goal of Therapy:  INR 2-3 Monitor platelets by anticoagulation protocol: Yes   Plan:  Start warfarin 7.5mg  x1  Start bridge with enoxaparin 40mg  q24h until INR >2    Daily INR F/u CBC   Durward Fortes, Pharm.D Student 05/13/2020 8:55 AM

## 2020-05-13 NOTE — Care Management Important Message (Signed)
Important Message  Patient Details IM Letter given to the Patient. Name: Zyrion Coey MRN: [id number] Date of Birth: 1940/10/16   Medicare Important Message Given:  Yes     Kerin Salen 05/13/2020, 2:10 PM

## 2020-05-13 NOTE — Evaluation (Signed)
Physical Therapy Evaluation Patient Details Name: Nathan [name] MRN: [id number] DOB: 04/10/1941 Today's Date: 05/13/2020   History of Present Illness  Nathan [name] is a 80 y.o. male with PMH significant for history of atrial fibrillation on anticoagulation, hypertension.  Patient presented to the ED on 1/23 after a fall impacting his right shoulder on the ground. Patient now s/p ORIF right humerus 05/12/20.  Clinical Impression  Patient is s/p above surgery resulting in functional limitations due to the deficits listed below (see PT Problem List).  Patient will benefit from skilled PT to increase their independence and safety with mobility to allow discharge to the venue listed below.  Pt requiring significant assist to stand and transfer today.  Pt reports difficulty and weakness with Left LE prior to this admission.  Pt lives at home alone and would benefit from d/c to SNF.       Follow Up Recommendations SNF    Equipment Recommendations  Wheelchair cushion (measurements PT);Wheelchair (measurements PT)    Recommendations for Other Services       Precautions / Restrictions Precautions Precautions: Shoulder Type of Shoulder Precautions: No AROM, No PROM Shoulder Interventions: Shoulder sling/immobilizer;At all times;Off for dressing/bathing/exercises Restrictions Weight Bearing Restrictions: Yes RUE Weight Bearing: Non weight bearing Other Position/Activity Restrictions: May be bear 5 lbs to elbow      Mobility  Bed Mobility Overal bed mobility: Needs Assistance Bed Mobility: Sit to Supine       Sit to supine: Max assist;+2 for physical assistance   General bed mobility comments: assist for controlling trunk descent and bringing LEs onto bed    Transfers Overall transfer level: Needs assistance Equipment used: None Transfers: Sit to/from [employer] Transfers Sit to Stand: Max assist;From elevated surface;+2 physical assistance Stand pivot transfers: Mod  assist;+2 physical assistance       General transfer comment: assist to rise and steady, pt with difficulty weight shifting, poor LE strength to assist, keeps knees flexed despite extension cues; provided L forearm support to pt to weight bear (pt attempting to use R UE per morning visit with OT so had pt old socks in his hand to limit his use prior to transferring); pt performed another sit to stand from taller bed surface and still requiring significant assist  Ambulation/Gait                Stairs            Wheelchair Mobility    Modified Rankin (Stroke Patients Only)       Balance           Standing balance support: Single extremity supported Standing balance-Leahy Scale: Zero                               Pertinent Vitals/Pain Pain Assessment: Faces Faces Pain Scale: Hurts even more Pain Location: low back (with transfers) Pain Descriptors / Indicators: Grimacing;Moaning Pain Intervention(s): Repositioned;Monitored during session    Home Living Family/patient expects to be discharged to:: Private residence Living Arrangements: Alone Available Help at Discharge: Family;Available PRN/intermittently Type of Home: House Home Access: Stairs to enter Entrance Stairs-Rails: Psychiatric nurse of Steps: 4-5 Home Layout: One level Home Equipment: Walker - 2 wheels;Cane - single point      Prior Function Level of Independence: Independent               Hand Dominance   Dominant Hand: Right    Extremity/Trunk  Assessment        Lower Extremity Assessment Lower Extremity Assessment: Generalized weakness;LLE deficits/detail LLE Deficits / Details: pt reports weak L LE prior to admission, has been delaying TKA       Communication   Communication: HOH  Cognition Arousal/Alertness: Awake/alert Behavior During Therapy: WFL for tasks assessed/performed Overall Cognitive Status: Within Functional Limits for tasks  assessed                                 General Comments: mostly appropriate, very HOH      General Comments      Exercises     Assessment/Plan    PT Assessment Patient needs continued PT services  PT Problem List Decreased strength;Decreased mobility;Decreased balance;Decreased knowledge of use of DME;Pain;Decreased activity tolerance;Decreased knowledge of precautions       PT Treatment Interventions DME instruction;Gait training;Balance training;Therapeutic exercise;Functional mobility training;Therapeutic activities;Patient/family education;Wheelchair mobility training    PT Goals (Current goals can be found in the Care Plan section)  Acute Rehab PT Goals PT Goal Formulation: With patient Time For Goal Achievement: 05/27/20 Potential to Achieve Goals: Fair    Frequency Min 2X/week   Barriers to discharge        Co-evaluation PT/OT/SLP Co-Evaluation/Treatment: Yes Reason for Co-Treatment: For patient/therapist safety;To address functional/ADL transfers PT goals addressed during session: Mobility/safety with mobility OT goals addressed during session: ADL's and self-care       AM-PAC PT "6 Clicks" Mobility  Outcome Measure Help needed turning from your back to your side while in a flat bed without using bedrails?: A Lot Help needed moving from lying on your back to sitting on the side of a flat bed without using bedrails?: A Lot Help needed moving to and from a bed to a chair (including a wheelchair)?: A Lot Help needed standing up from a chair using your arms (e.g., wheelchair or bedside chair)?: A Lot Help needed to walk in hospital room?: Total Help needed climbing 3-5 steps with a railing? : Total 6 Click Score: 10    End of Session Equipment Utilized During Treatment: Gait belt Activity Tolerance: Patient tolerated treatment well Patient left: in bed;with call bell/phone within reach;with bed alarm set   PT Visit Diagnosis: Unsteadiness on  feet (R26.81);Other abnormalities of gait and mobility (R26.89);Muscle weakness (generalized) (M62.81)    Time: 7062-3762 PT Time Calculation (min) (ACUTE ONLY): 19 min   Charges:   PT Evaluation $PT Eval Moderate Complexity: 1 Mod         Kati PT, DPT Acute Rehabilitation Services Pager: [phone] Office: [phone]  York Ram E 05/13/2020, 1:41 PM

## 2020-05-13 NOTE — Progress Notes (Addendum)
    Subjective: Patient reports pain as mild this morning}.  Tolerating diet.  Urinating.  +Flatus.  No CP, SOB.  Objective:   VITALS:   Vitals:   05/12/20 2211 05/13/20 0228 05/13/20 0332 05/13/20 0628  BP: (!) 136/94 (!) 168/99 (!) 155/108 (!) 156/91  Pulse: (!) 103 89 90 82  Resp: 18 18  18   Temp: 98.5 F (36.9 C) (!) 97.3 F (36.3 C)  97.8 F (36.6 C)  TempSrc: Oral Oral  Oral  SpO2: 95% 100% 96% (!) 88%  Weight:      Height:       CBC Latest Ref Rng & Units 05/12/2020 05/11/2020 05/10/2020  WBC 4.0 - 10.5 K/uL 6.0 4.9 9.5  Hemoglobin 13.0 - 17.0 g/dL 9.7(L) 10.2(L) 12.0(L)  Hematocrit 39.0 - 52.0 % 31.1(L) 32.5(L) 37.5(L)  Platelets 150 - 400 K/uL 206 209 244   BMP Latest Ref Rng & Units 05/12/2020 05/11/2020 05/10/2020  Glucose 70 - 99 mg/dL 82 97 129(H)  BUN 8 - 23 mg/dL 49(H) 42(H) 45(H)  Creatinine 0.61 - 1.24 mg/dL 2.03(H) 1.86(H) 2.01(H)  Sodium 135 - 145 mmol/L 141 141 141  Potassium 3.5 - 5.1 mmol/L 3.7 3.8 4.6  Chloride 98 - 111 mmol/L 108 108 105  CO2 22 - 32 mmol/L 23 23 25   Calcium 8.9 - 10.3 mg/dL 8.0(L) 8.4(L) 9.2   Intake/Output      01/25 0701 01/26 0700 01/26 0701 01/27 0700   P.O. 600    I.V. (mL/kg) 1313.5 (14.3)    IV Piggyback 600.1    Total Intake(mL/kg) 2513.6 (27.3)    Urine (mL/kg/hr) 100 (0)    Blood 10    Total Output 110    Net +2403.6         Urine Occurrence 3 x       Physical Exam: General: NAD.  Resting in bed, aleart Resp: No increased wob Cardio: regular rate and rhythm ABD soft Neurologically intact MSK Neurovascularly intact Sensation intact distally Intact pulses distally Incision: dressing C/D/I Compartment soft  Assessment: 1 Day Post-Op  S/P Procedure(s) (LRB): OPEN REDUCTION INTERNAL FIXATION (ORIF) DISTAL HUMERUS FRACTURE (Right) by Dr. Ernesta Amble. Murphy on 05/12/20  Principal Problem:   Comminuted fracture of right humerus Active Problems:   Fall at home, initial encounter   A-fib ([place])   Elevated  serum creatinine  Plan:  Up with therapy Incentive Spirometry Elevate and Apply ice  Weightbearing: NWB RUE to the shoulder. Minimal WB to the right hand through the elbow.  Insicional and dressing care: Dressings left intact until follow-up Orthopedic device(s): sling Showering: Keep dressing dry VTE prophylaxis: Resume coumadin , Per pharmacy recs will do lovenox and coumadin until INR =>2 SCDs, ambulation Pain control: Oxycodone, tylenol. Try and avoid IV meds if possible. Follow - up plan: 2 weeks Contact information:  Edmonia Lynch MD, Margy Clarks PA-C  Dispo: Would like for pt. To go home if possible.     Rachael Fee, PA-C 05/13/2020, 8:24 AM

## 2020-05-13 NOTE — Progress Notes (Signed)
PROGRESS NOTE  Nathan [name]  DOB: 28-May-1940  PCP: Janith Lima, MD CXK:[id number]  DOA: 05/10/2020  LOS: 3 days   Chief Complaint  Patient presents with   Fall  Coumadin    Level2   Brief narrative: Nathan [name] is a 80 y.o. male with PMH significant for history of atrial fibrillation on anticoagulation, hypertension. Patient presented to the ED on 1/23 after a fall impacting his right shoulder on the ground. X-ray showed proximal right humerus fracture, no acute lung process CT right shoulder showed a comminuted fracture of proximal humeral metadiaphysis with displacement, angulation, and override.  Greater tuberosity fracture associated. Admitted under hospitalist service Orthopedic consultation obtained.  Subjective: Patient was seen and examined this morning. Lying down in bed.  Not in distress.  Pain controlled. Heart rate mostly over 100.  Blood pressure elevated as well.    Assessment/Plan: Comminuted fracture of right humerus -After mechanical fall. -1/25, underwent ORIF of right humerus by Dr. Percell Miller. -Continue pain control with oral oxycodone.  Minimize IV pain medicines.   -Orthopedics recommended NWB RUE to the shoulder, minimal WB to the right hand through the elbow.  Impaired mobility -Uses a walker at home. -PT evaluation pending  Afib -On long-term anticoagulation with Coumadin.   -INR was subtherapeutic prior to surgery.  Coumadin resumed.  Lovenox bridge until INR is more than 2.   -Continue diltiazem.  Resume metoprolol this morning because of elevated heart rate and blood pressure.   HTN -Currently blood pressure controlled on Cardizem.   -On hold on metoprolol, Lasix and lisinopril.  Continue to monitor.    AKI -May have underlying CKD as well.  Unclear baseline. -Creatinine was elevated to 2.01 on admission.  Elevated but remained stable for now. -Lasix and lisinopril on hold. -Continue to monitor. Recent Labs    05/10/20 0350  05/11/20 0521 05/12/20 0518 05/13/20 0906  BUN 45* 42* 49* 42*  CREATININE 2.01* 1.86* 2.03* 1.92*   Mild protein calorie malnutrition -Secondary to poor appetite, poor p.o. intake -Discussed the importance of nutrient dense food choices  Mobility: PT eval postprocedure Code Status:   Code Status: Full Code  Nutritional status: Body mass index is 29.95 kg/m.     Diet Order            Diet regular Room service appropriate? Yes; Fluid consistency: Thin  Diet effective now                 DVT prophylaxis: enoxaparin (LOVENOX) injection 40 mg Start: 05/13/20 1000 SCD's Start: 05/12/20 1602 SCDs Start: 05/10/20 2209   Antimicrobials:  None Fluid: None Consultants: Orthopedics Family Communication:  None at bedside  Status is: Inpatient  Remains inpatient appropriate because: Pending PT evaluation  Dispo: The patient is from: Home              Anticipated d/c is to: Home versus rehab depending on PT recommendation              Anticipated d/c date is: 3 days              Patient currently is not medically stable to d/c.   Difficult to place patient No       Infusions:    Scheduled Meds:  acetaminophen  1,000 mg Oral Q6H   diltiazem  120 mg Oral Daily   docusate sodium  100 mg Oral BID   enoxaparin (LOVENOX) injection  40 mg Subcutaneous Q24H   fentaNYL  50-100 mcg Intravenous  UD   finasteride  5 mg Oral Daily   lisinopril  40 mg Oral Daily   metoprolol tartrate  25 mg Oral BID   pantoprazole  40 mg Oral Daily   warfarin  7.5 mg Oral ONCE-1600   Warfarin - Pharmacist Dosing Inpatient   Does not apply q1600    Antimicrobials: Anti-infectives (From admission, onward)   Start     Dose/Rate Route Frequency Ordered Stop   05/12/20 1700  ceFAZolin (ANCEF) IVPB 2g/100 mL premix        2 g 200 mL/hr over 30 Minutes Intravenous Every 6 hours 05/12/20 1601 05/13/20 0448   05/12/20 0600  ceFAZolin (ANCEF) IVPB 2g/100 mL premix        2 g 200 mL/hr  over 30 Minutes Intravenous On call to O.R. 05/11/20 2325 05/12/20 1039      PRN meds: acetaminophen **OR** acetaminophen, alum & mag hydroxide-simeth, bisacodyl, HYDROmorphone (DILAUDID) injection, menthol-cetylpyridinium **OR** phenol, metoCLOPramide **OR** metoCLOPramide (REGLAN) injection, morphine injection, ondansetron **OR** ondansetron (ZOFRAN) IV, oxyCODONE, oxyCODONE, oxyCODONE, polyethylene glycol, senna-docusate   Objective: Vitals:   05/13/20 0628 05/13/20 0959  BP: (!) 156/91 136/78  Pulse: 82 98  Resp: 18 16  Temp: 97.8 F (36.6 C) (!) 97.5 F (36.4 C)  SpO2: (!) 88% 99%    Intake/Output Summary (Last 24 hours) at 05/13/2020 1247 Last data filed at 05/13/2020 1000 Gross per 24 hour  Intake 2157.66 ml  Output 300 ml  Net 1857.66 ml   Filed Weights   05/10/20 0349  Weight: 92 kg   Weight change:  Body mass index is 29.95 kg/m.   Physical Exam: General exam: Pleasant, elderly Caucasian male.  Not in distress Skin: No rashes, lesions or ulcers. HEENT: Atraumatic, normocephalic, no obvious bleeding Lungs: Clear to auscultation bilaterally CVS: Regular rate and rhythm, no murmur GI/Abd soft, nontender, nondistended, bowel sound present CNS: Alert, awake, oriented x3, hard of hearing at baseline Psychiatry: Mood appropriate Extremities: Right shoulder in sling.  No pedal edema, no calf tenderness  Data Review: I have personally reviewed the laboratory data and studies available.  Recent Labs  Lab 05/10/20 0350 05/11/20 0521 05/12/20 0518 05/13/20 0906  WBC 9.5 4.9 6.0 8.2  NEUTROABS 8.0* 3.0 3.9 7.2  HGB 12.0* 10.2* 9.7* 10.7*  HCT 37.5* 32.5* 31.1* 34.6*  MCV 97.9 97.0 98.1 100.0  PLT 244 209 206 238   Recent Labs  Lab 05/10/20 0350 05/11/20 0521 05/12/20 0518 05/13/20 0906  NA 141 141 141 143  K 4.6 3.8 3.7 4.1  CL 105 108 108 111  CO2 25 23 23  20*  GLUCOSE 129* 97 82 108*  BUN 45* 42* 49* 42*  CREATININE 2.01* 1.86* 2.03* 1.92*   CALCIUM 9.2 8.4* 8.0* 7.9*  MG  --  2.1  --   --     F/u labs ordered  Signed, [name], MD [facility] 05/13/2020

## 2020-05-13 NOTE — TOC Progression Note (Signed)
Transition of Care [facility]) - Progression Note    Patient Details  Name: Nathan [name] MRN: [id number] Date of Birth: 1940/08/28  Transition of Care [facility] CM/SW [place], [place] Phone Number: 05/13/2020, 5:34 PM  Clinical Narrative:    CSW met with the patient and his son Nathan [name] at bedside to discuss discharge plan to rehab. Patient has been to SNF in the past and is familiar with the process. CSW will follow up with bed offers in the am.   Expected Discharge Plan: [place] Barriers to Discharge: Continued Medical Work up  Expected Discharge Plan and Services Expected Discharge Plan: [place] In-house Referral: Clinical Social Work Discharge Planning Services: CM Consult   Living arrangements for the past 2 months: Single Family Home                                       Social Determinants of Health (SDOH) Interventions    Readmission Risk Interventions No flowsheet data found.

## 2020-05-14 ENCOUNTER — Ambulatory Visit: Payer: Medicare Other

## 2020-05-14 DIAGNOSIS — S42351A Displaced comminuted fracture of shaft of humerus, right arm, initial encounter for closed fracture: Secondary | ICD-10-CM | POA: Diagnosis not present

## 2020-05-14 LAB — BASIC METABOLIC PANEL
Anion gap: 14 (ref 5–15)
BUN: 49 mg/dL — ABNORMAL HIGH (ref 8–23)
CO2: 19 mmol/L — ABNORMAL LOW (ref 22–32)
Calcium: 8.5 mg/dL — ABNORMAL LOW (ref 8.9–10.3)
Chloride: 109 mmol/L (ref 98–111)
Creatinine, Ser: 1.96 mg/dL — ABNORMAL HIGH (ref 0.61–1.24)
GFR, Estimated: 34 mL/min — ABNORMAL LOW (ref >60)
Glucose, Bld: 98 mg/dL (ref 70–99)
Potassium: 4.1 mmol/L (ref 3.5–5.1)
Sodium: 142 mmol/L (ref 135–145)

## 2020-05-14 LAB — CBC WITH DIFFERENTIAL/PLATELET
Abs Immature Granulocytes: 0.24 10*3/uL — ABNORMAL HIGH (ref 0.00–0.07)
Basophils Absolute: 0 10*3/uL (ref 0.0–0.1)
Basophils Relative: 0 %
Eosinophils Absolute: 0 10*3/uL (ref 0.0–0.5)
Eosinophils Relative: 0 %
HCT: 35.3 % — ABNORMAL LOW (ref 39.0–52.0)
Hemoglobin: 10.9 g/dL — ABNORMAL LOW (ref 13.0–17.0)
Immature Granulocytes: 2 %
Lymphocytes Relative: 4 %
Lymphs Abs: 0.4 10*3/uL — ABNORMAL LOW (ref 0.7–4.0)
MCH: 30.7 pg (ref 26.0–34.0)
MCHC: 30.9 g/dL (ref 30.0–36.0)
MCV: 99.4 fL (ref 80.0–100.0)
Monocytes Absolute: 0.8 10*3/uL (ref 0.1–1.0)
Monocytes Relative: 7 %
Neutro Abs: 9.4 10*3/uL — ABNORMAL HIGH (ref 1.7–7.7)
Neutrophils Relative %: 87 %
Platelets: 248 10*3/uL (ref 150–400)
RBC: 3.55 MIL/uL — ABNORMAL LOW (ref 4.22–5.81)
RDW: 15.4 % (ref 11.5–15.5)
WBC: 10.9 10*3/uL — ABNORMAL HIGH (ref 4.0–10.5)
nRBC: 0 % (ref 0.0–0.2)

## 2020-05-14 LAB — SARS CORONAVIRUS 2 BY RT PCR (HOSPITAL ORDER, PERFORMED IN ~~LOC~~ HOSPITAL LAB): SARS Coronavirus 2: POSITIVE — AB

## 2020-05-14 LAB — PROTIME-INR
INR: 1.5 — ABNORMAL HIGH (ref 0.8–1.2)
Prothrombin Time: 17.5 seconds — ABNORMAL HIGH (ref 11.4–15.2)

## 2020-05-14 LAB — SARS CORONAVIRUS 2 (TAT 6-24 HRS): SARS Coronavirus 2: POSITIVE — AB

## 2020-05-14 MED ORDER — SENNOSIDES-DOCUSATE SODIUM 8.6-50 MG PO TABS: 1.0000 | ORAL_TABLET | Freq: Every evening | ORAL | Status: DC | PRN

## 2020-05-14 MED ORDER — ALUM & MAG HYDROXIDE-SIMETH 200-200-20 MG/5ML PO SUSP: 15.0000 mL | Freq: Four times a day (QID) | ORAL | 0 refills | Status: DC | PRN

## 2020-05-14 MED ORDER — WARFARIN SODIUM 5 MG PO TABS
7.5000 mg | ORAL_TABLET | Freq: Once | ORAL | Status: AC
  Administered 2020-05-14: 7.5 mg via ORAL
  Filled 2020-05-14: qty 1

## 2020-05-14 MED ORDER — POLYETHYLENE GLYCOL 3350 17 G PO PACK: 17.0000 g | PACK | Freq: Every day | ORAL | 0 refills | Status: DC | PRN

## 2020-05-14 MED ORDER — FUROSEMIDE 40 MG PO TABS: 40.0000 mg | ORAL_TABLET | Freq: Every day | ORAL | Status: DC

## 2020-05-14 MED ORDER — FUROSEMIDE 40 MG PO TABS
40.0000 mg | ORAL_TABLET | Freq: Every day | ORAL | Status: DC
  Administered 2020-05-14 – 2020-05-15 (×2): 40 mg via ORAL
  Filled 2020-05-14 (×2): qty 1

## 2020-05-14 MED ORDER — HYDRALAZINE HCL 20 MG/ML IJ SOLN
5.0000 mg | Freq: Once | INTRAMUSCULAR | Status: AC
  Administered 2020-05-14: 5 mg via INTRAVENOUS
  Filled 2020-05-14: qty 1

## 2020-05-14 MED ORDER — MELATONIN 3 MG PO CAPS: 3.0000 mg | ORAL_CAPSULE | Freq: Every evening | ORAL | 0 refills | Status: DC | PRN

## 2020-05-14 MED ORDER — OXYCODONE-ACETAMINOPHEN 5-325 MG PO TABS: 1.0000 | ORAL_TABLET | Freq: Four times a day (QID) | ORAL | 0 refills | Status: DC | PRN

## 2020-05-14 MED ORDER — BISACODYL 10 MG RE SUPP: 10.0000 mg | Freq: Every day | RECTAL | 0 refills | Status: DC | PRN

## 2020-05-14 NOTE — Discharge Summary (Addendum)
Physician Discharge Summary  Nathan [name] DOB: 03-29-1941 DOA: 05/10/2020  PCP: Janith Lima, MD  Admit date: 05/10/2020 Discharge date: 05/15/2020  Admitted From: Home Discharge disposition: SNF   Code Status: Full Code  Diet Recommendation: Cardiac diet  Discharge Diagnosis:   Principal Problem:   Comminuted fracture of right humerus Active Problems:   Fall at home, initial encounter   A-fib [facility])   Elevated serum creatinine   Chief Complaint  Patient presents with  . Fall  Coumadin    Level2   Brief narrative: Nathan [name] is a 80 y.o. male with PMH significant for history of atrial fibrillation on anticoagulation, hypertension. Patient presented to the ED on 1/23 after a fall impacting his right shoulder on the ground. X-ray showed proximal right humerus fracture, no acute lung process CT right shoulder showed a comminuted fracture of proximal humeral metadiaphysis with displacement, angulation, and override.  Greater tuberosity fracture associated. Admitted under hospitalist service Orthopedic consultation obtained. 1/25, underwent ORIF of right humerus by Dr. Percell Miller.  Subjective: Patient was seen and examined this morning. Lying down in bed.  Not in distress.  Pain controlled. Heart rate and blood pressure improved.  Hospital course: Comminuted fracture of right humerus -After mechanical fall. -1/25, underwent ORIF of right humerus by Dr. Percell Miller. -Continue pain control with oral oxycodone. -Orthopedics recommended NWB RUE to the shoulder, minimal WB to the right hand through the elbow.  Impaired mobility -Prior to admission, patient was using a walker at home. -PT evaluation appreciated.  SNF recommended.  Afib -On long-term anticoagulation with Coumadin.   -INR was subtherapeutic prior to surgery.  Coumadin have been resumed.  INR 1.9 today, close to target (2-3). -I discussed with patient's son Mr. Dusty Windholz.  Patient has been  on Coumadin for last several years.  Lately he is having frequent falls and hence at high risk of significant bleeding with continuation of Coumadin.  He however also states that patient at this time is too weak to stand and fall really weak and may end up being bed or wheelchair-bound while in the SNF.  Son would like to continue Coumadin at this time and revisit the decision later based on his functional improvement. -Continue diltiazem.  Resume metoprolol this morning because of elevated heart rate and blood pressure.   HTN -Home medicines include Cardizem, metoprolol, Lasix and lisinopril. -Currently on Cardizem, metoprolol and lisinopril.  Blood pressure remains elevated this morning.  Resume Lasix at discharge.  He was using 40 mg twice daily at home.  I would resume at 40 mg daily because of risk of dehydration associated with her advanced age.  Does not look hypervolemic to me.  AKI -May have underlying CKD as well.  Unclear baseline. -Creatinine was elevated to 2.01 on admission.  Elevated but remained stable for now. Recent Labs    05/10/20 0350 05/11/20 0521 05/12/20 0518 05/13/20 0906 05/14/20 0812 05/15/20 0318  BUN 45* 42* 49* 42* 49* 50*  CREATININE 2.01* 1.86* 2.03* 1.92* 1.96* 1.89*   Mild protein calorie malnutrition -Secondary to poor appetite, poor p.o. intake -Discussed the importance of nutrient dense food choices  Acute delirium -Patient is somewhat confused and disoriented yesterday.  Improved later in the day.  Not restless or agitated.  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  Longer stay in the hospital most likely will make it worse only.  We will discharge him with pain medicines as well as melatonin at bedtime.  Discussed with patient and son.  Incidental Covid test positive -Completely asymptomatic.  Patient states he got vaccinated twice less than 6 months ago.  Does not qualify for booster yet.  Stable for discharge to  SNF today.  Wound care: Incision (Closed) 05/12/20 Arm Right (Active)  Date First Assessed/Time First Assessed: 05/12/20 1138   Location: Arm  Location Orientation: Right    Assessments 05/12/2020 12:15 PM 05/14/2020 11:30 PM  Dressing Type Silicone dressing Silicone dressing  Dressing Clean;Dry;Intact Clean;Dry;Intact;Old drainage (marked)  Site / Wound Assessment Dressing in place / Unable to assess Dressing in place / Unable to assess  Drainage Amount None Scant  Treatment Ice applied Ice applied;Off loading     No Linked orders to display    Discharge Exam:   Vitals:   05/14/20 2335 05/14/20 2335 05/15/20 0520 05/15/20 0852  BP:  (!) 177/90 (!) 164/89 116/71  Pulse:  100 98 97  Resp:  18 17   Temp:  98.2 F (36.8 C) 98.2 F (36.8 C) (!) 97.3 F (36.3 C)  TempSrc:    Oral  SpO2:  99% 97% 98%  Weight: 94.8 kg     Height: 5\' 9"  (1.753 m)       Body mass index is 30.86 kg/m.  General exam: Pleasant, elderly Caucasian male.  Not in distress. Skin: No rashes, lesions or ulcers. HEENT: Atraumatic, normocephalic, no obvious bleeding Lungs: Clear to auscultation bilaterally CVS: Regular rate and rhythm, no murmur GI/Abd soft, nontender, nondistended, bowel sound present CNS: Alert, awake, knows he is in the hospital, not restless or agitated Psychiatry: Depressed look Extremities: Right arm in sling.  Follow ups:   Discharge Instructions    Diet - low sodium heart healthy   Complete by: As directed    Discharge wound care:   Complete by: As directed    Reinforce dressing  As needed   Discharge wound care:   Complete by: As directed    Reinforce dressing  As needed   Increase activity slowly   Complete by: As directed    Increase activity slowly   Complete by: As directed       Contact information for follow-up providers    [name], MD In 2 weeks.   Specialty: Orthopedic Surgery Contact information: [address] [place]  [address] [phone]        Janith Lima, MD Follow up.   Specialty: Internal Medicine Contact information: [place] [address] [phone]            Contact information for after-discharge care    Destination    [facility] Preferred SNF .   Service: Skilled Nursing Contact information: [address] [place] [phone]                  Recommendations for Outpatient Follow-Up:   1. Follow-up with PCP as an outpatient 2. Follow-up with orthopedics as an outpatient  Discharge Instructions:  Follow with Primary MD Janith Lima, MD in 7 days   Get CBC/BMP checked in next visit within 1 week by PCP or SNF MD ( we routinely change or add medications that can affect your baseline labs and fluid status, therefore we recommend that you get the mentioned basic workup next visit with your PCP, your PCP may decide not to get them or add new tests based on their clinical decision)  On your next visit with your PCP,  please Get Medicines reviewed and adjusted.  Please request your PCP  to go over all Hospital Tests and Procedure/Radiological results at the follow up, please get all Hospital records sent to your Prim MD by signing hospital release before you go home.  Activity: As tolerated with Full fall precautions use walker/cane & assistance as needed  For Heart failure patients - Check your Weight same time everyday, if you gain over 2 pounds, or you develop in leg swelling, experience more shortness of breath or chest pain, call your Primary MD immediately. Follow Cardiac Low Salt Diet and 1.5 lit/day fluid restriction.  If you have smoked or chewed Tobacco in the last 2 yrs please stop smoking, stop any regular Alcohol  and or any Recreational drug use.  If you experience worsening of your admission symptoms, develop shortness of breath, life threatening emergency, suicidal or homicidal thoughts  you must seek medical attention immediately by calling 911 or calling your MD immediately  if symptoms less severe.  You Must read complete instructions/literature along with all the possible adverse reactions/side effects for all the Medicines you take and that have been prescribed to you. Take any new Medicines after you have completely understood and accpet all the possible adverse reactions/side effects.   Do not drive, operate heavy machinery, perform activities at heights, swimming or participation in water activities or provide baby sitting services if your were admitted for syncope or siezures until you have seen by Primary MD or a Neurologist and advised to do so again.  Do not drive when taking Pain medications.  Do not take more than prescribed Pain, Sleep and Anxiety Medications  Wear Seat belts while driving.   Please note You were cared for by a hospitalist during your hospital stay. If you have any questions about your discharge medications or the care you received while you were in the hospital after you are discharged, you can call the unit and asked to speak with the hospitalist on call if the hospitalist that took care of you is not available. Once you are discharged, your primary care physician will handle any further medical issues. Please note that NO REFILLS for any discharge medications will be authorized once you are discharged, as it is imperative that you return to your primary care physician (or establish a relationship with a primary care physician if you do not have one) for your aftercare needs so that they can reassess your need for medications and monitor your lab values.    Allergies as of 05/15/2020   No Known Allergies     Medication List    TAKE these medications   acetaminophen 325 MG tablet Commonly known as: TYLENOL Take 975 mg by mouth daily.   allopurinol 100 MG tablet Commonly known as: ZYLOPRIM Take 100 mg by mouth daily.   alum & mag  hydroxide-simeth 200-200-20 MG/5ML suspension Commonly known as: MAALOX/MYLANTA Take 15 mLs by mouth every 6 (six) hours as needed for indigestion or heartburn.   bisacodyl 10 MG suppository Commonly known as: DULCOLAX Place 1 suppository (10 mg total) rectally daily as needed for moderate constipation.   diltiazem 120 MG 24 hr capsule Commonly known as: CARDIZEM CD Take 120 mg by mouth daily.   finasteride 5 MG tablet Commonly known as: PROSCAR Take 5 mg by mouth daily.   furosemide 40 MG tablet Commonly known as: LASIX Take 1 tablet (40 mg total) by mouth daily. What changed: when to take this   levocetirizine 5 MG  tablet Commonly known as: XYZAL Take 5 mg by mouth at bedtime as needed for allergies.   lisinopril 40 MG tablet Commonly known as: ZESTRIL Take 40 mg by mouth daily.   Melatonin 3 MG Caps Take 1 capsule (3 mg total) by mouth at bedtime as needed.   metoprolol tartrate 50 MG tablet Commonly known as: LOPRESSOR Take 50 mg by mouth 2 (two) times daily.   oxyCODONE-acetaminophen 5-325 MG tablet Commonly known as: Percocet Take 1 tablet by mouth every 6 (six) hours as needed for up to 20 days for severe pain.   polyethylene glycol 17 g packet Commonly known as: MIRALAX / GLYCOLAX Take 17 g by mouth daily as needed for mild constipation.   senna-docusate 8.6-50 MG tablet Commonly known as: Senokot-S Take 1 tablet by mouth at bedtime as needed for mild constipation.   warfarin 5 MG tablet Commonly known as: COUMADIN Take 5 mg by mouth daily.            Discharge Care Instructions  (From admission, onward)         Start     Ordered   05/15/20 0000  Discharge wound care:       Comments: Reinforce dressing  As needed   05/15/20 1028   05/14/20 0000  Discharge wound care:       Comments: Reinforce dressing  As needed   05/14/20 1119          Time coordinating discharge: 35 minutes  The results of significant diagnostics from this  hospitalization (including imaging, microbiology, ancillary and laboratory) are listed below for reference.    Procedures and Diagnostic Studies:   CT Shoulder Right Wo Contrast  Result Date: 05/10/2020 CLINICAL DATA:  Right humeral fracture EXAM: CT OF THE UPPER RIGHT EXTREMITY WITHOUT CONTRAST TECHNIQUE: Multidetector CT imaging of the upper right extremity was performed according to the standard protocol. COMPARISON:  None. FINDINGS: Bones/Joint/Cartilage There is a comminuted fracture of the proximal humerus with both transverse and oblique fractures involving the surgical neck of the humerus and proximal diaphysis of the humerus, respectively. The humeral shaft is displaced posterior to the humeral head by 1 shaft with, displaced medial to the humeral head by 1/2 shaft with, demonstrates roughly 3 cm override, and demonstrates roughly 40 degrees anterior and 30 degrees lateral angulation. A minimally displaced fracture is also seen involving the greater tuberosity of the humerus with fracture fragments in anatomic alignment. The articular surface of the humeral head is still seated within the glenoid fossa. The visualized clavicle and the scapula are intact. The acromioclavicular joint space is preserved. The visualized right thoracic cage is intact. Ligaments Suboptimally assessed by CT. Muscles and Tendons Unremarkable Soft tissues Small left shoulder effusion present. Infiltrative change within the a soft tissues surrounding the proximal humerus and inferior to the right shoulder likely represents small amount of interstitial hemorrhage and/or edema. IMPRESSION: Comminuted fracture of the proximal humeral metadiaphysis with displacement, angulation, and override as described above. Associated minimally displaced anatomically aligned greater tuberosity fracture. Electronically Signed   By: Fidela Salisbury MD   On: 05/10/2020 05:20   DG Chest Portable 1 View  Result Date: 05/10/2020 CLINICAL DATA:   Fall EXAM: PORTABLE CHEST 1 VIEW COMPARISON:  Chest CT 02/03/2020 FINDINGS: Cardiomegaly and aortic tortuosity. Vascular fullness at the hila. There is no edema, consolidation, effusion, or pneumothorax. Displaced proximal humerus fracture. IMPRESSION: 1. Right proximal humerus fracture. 2. Chronic cardiomegaly. Electronically Signed   By: Neva Seat.D.  On: 05/10/2020 04:15   DG Shoulder Right Portable  Result Date: 05/10/2020 CLINICAL DATA:  Fall. EXAM: PORTABLE RIGHT SHOULDER COMPARISON:  None. FINDINGS: Humeral neck fracture with over 100% displacement and fracture overlapping. There may be continuation along the greater tuberosity, but no measurable displacement at this level on the submitted views. Generalized osteopenia. IMPRESSION: Displaced and overriding humeral neck fracture. Electronically Signed   By: Monte Fantasia M.D.   On: 05/10/2020 04:17     Labs:   Basic Metabolic Panel: Recent Labs  Lab 05/11/20 0521 05/12/20 0518 05/13/20 0906 05/14/20 0812 05/15/20 0318  NA 141 141 143 142 141  K 3.8 3.7 4.1 4.1 3.9  CL 108 108 111 109 107  CO2 23 23 20* 19* 22  GLUCOSE 97 82 108* 98 87  BUN 42* 49* 42* 49* 50*  CREATININE 1.86* 2.03* 1.92* 1.96* 1.89*  CALCIUM 8.4* 8.0* 7.9* 8.5* 8.2*  MG 2.1  --   --   --   --    GFR Estimated Creatinine Clearance: 34.2 mL/min (A) (by C-G formula based on SCr of 1.89 mg/dL (H)). Liver Function Tests: Recent Labs  Lab 05/10/20 0350 05/11/20 0521  AST 25 38  ALT 18 18  ALKPHOS 63 42  BILITOT 0.9 0.5  PROT 6.6 5.5*  ALBUMIN 3.3* 2.8*   No results for input(s): LIPASE, AMYLASE in the last 168 hours. No results for input(s): AMMONIA in the last 168 hours. Coagulation profile Recent Labs  Lab 05/11/20 0521 05/12/20 0518 05/13/20 0528 05/14/20 0812 05/15/20 0318  INR 2.3* 1.9* 1.6* 1.5* 1.9*    CBC: Recent Labs  Lab 05/11/20 0521 05/12/20 0518 05/13/20 0906 05/14/20 0812 05/15/20 0318  WBC 4.9 6.0 8.2 10.9* 7.5   NEUTROABS 3.0 3.9 7.2 9.4* 6.1  HGB 10.2* 9.7* 10.7* 10.9* 10.0*  HCT 32.5* 31.1* 34.6* 35.3* 31.8*  MCV 97.0 98.1 100.0 99.4 97.0  PLT 209 206 238 248 216   Cardiac Enzymes: No results for input(s): CKTOTAL, CKMB, CKMBINDEX, TROPONINI in the last 168 hours. BNP: Invalid input(s): POCBNP CBG: No results for input(s): GLUCAP in the last 168 hours. D-Dimer No results for input(s): DDIMER in the last 72 hours. Hgb A1c No results for input(s): HGBA1C in the last 72 hours. Lipid Profile No results for input(s): CHOL, HDL, LDLCALC, TRIG, CHOLHDL, LDLDIRECT in the last 72 hours. Thyroid function studies No results for input(s): TSH, T4TOTAL, T3FREE, THYROIDAB in the last 72 hours.  Invalid input(s): FREET3 Anemia work up No results for input(s): VITAMINB12, FOLATE, FERRITIN, TIBC, IRON, RETICCTPCT in the last 72 hours. Microbiology Recent Results (from the past 240 hour(s))  SARS Coronavirus 2 by RT PCR (hospital order, performed in [facility] hospital lab) Nasopharyngeal Nasopharyngeal Swab     Status: None   Collection Time: 05/10/20  3:49 AM   Specimen: Nasopharyngeal Swab  Result Value Ref Range Status   SARS Coronavirus 2 NEGATIVE NEGATIVE Final    Comment: (NOTE) SARS-CoV-2 target nucleic acids are NOT DETECTED.  The SARS-CoV-2 RNA is generally detectable in upper and lower respiratory specimens during the acute phase of infection. The lowest concentration of SARS-CoV-2 viral copies this assay can detect is 250 copies / mL. A negative result does not preclude SARS-CoV-2 infection and should not be used as the sole basis for treatment or other patient management decisions.  A negative result may occur with improper specimen collection / handling, submission of specimen other than nasopharyngeal swab, presence of viral mutation(s) within the areas targeted  by this assay, and inadequate number of viral copies (<250 copies / mL). A negative result must be combined with  clinical observations, patient history, and epidemiological information.  Fact Sheet for Patients:   StrictlyIdeas.no  Fact Sheet for Healthcare Providers: BankingDealers.co.za  This test is not yet approved or  cleared by the [place] FDA and has been authorized for detection and/or diagnosis of SARS-CoV-2 by FDA under an Emergency Use Authorization (EUA).  This EUA will remain in effect (meaning this test can be used) for the duration of the COVID-19 declaration under Section 564(b)(1) of the Act, 21 U.S.C. section 360bbb-3(b)(1), unless the authorization is terminated or revoked sooner.  Performed at [facility], [place] [address] [place], [place] [address]   Surgical pcr screen     Status: None   Collection Time: 05/12/20 12:17 AM   Specimen: Nasal Mucosa; Nasal Swab  Result Value Ref Range Status   MRSA, PCR NEGATIVE NEGATIVE Final   Staphylococcus aureus NEGATIVE NEGATIVE Final    Comment: (NOTE) The Xpert SA Assay (FDA approved for NASAL specimens in patients 26 years of age and older), is one component of a comprehensive surveillance program. It is not intended to diagnose infection nor to guide or monitor treatment. Performed at [facility], [place] [address], [place], [place] [address]   SARS CORONAVIRUS 2 (TAT 6-24 HRS) Nasopharyngeal Nasopharyngeal Swab     Status: Abnormal   Collection Time: 05/14/20 11:47 AM   Specimen: Nasopharyngeal Swab  Result Value Ref Range Status   SARS Coronavirus 2 POSITIVE (A) NEGATIVE Final    Comment: (NOTE) SARS-CoV-2 target nucleic acids are DETECTED.  The SARS-CoV-2 RNA is generally detectable in upper and lower respiratory specimens during the acute phase of infection. Positive results are indicative of the presence of SARS-CoV-2 RNA. Clinical correlation with patient history and other diagnostic information is  necessary to determine patient infection  status. Positive results do not rule out bacterial infection or co-infection with other viruses.  The expected result is Negative.  Fact Sheet for Patients: SugarRoll.be  Fact Sheet for Healthcare Providers: https://www.woods-mathews.com/  This test is not yet approved or cleared by the [place] FDA and  has been authorized for detection and/or diagnosis of SARS-CoV-2 by FDA under an Emergency Use Authorization (EUA). This EUA will remain  in effect (meaning this test can be used) for the duration of the COVID-19 declaration under Section 564(b)(1) of the Act, 21 U. S.C. section 360bbb-3(b)(1), unless the authorization is terminated or revoked sooner.   Performed at [facility], [place] [address], [place], [place] [address]   SARS Coronavirus 2 by RT PCR (hospital order, performed in [facility] hospital lab) Nasopharyngeal Nasopharyngeal Swab     Status: Abnormal   Collection Time: 05/14/20  5:53 PM   Specimen: Nasopharyngeal Swab  Result Value Ref Range Status   SARS Coronavirus 2 POSITIVE (A) NEGATIVE Final    Comment: RESULT CALLED TO, READ BACK BY AND VERIFIED WITH: SMITH A. 1.27.22 @ 2055 BY MECIAL J. (NOTE) SARS-CoV-2 target nucleic acids are DETECTED  SARS-CoV-2 RNA is generally detectable in upper respiratory specimens  during the acute phase of infection.  Positive results are indicative  of the presence of the identified virus, but do not rule out bacterial infection or co-infection with other pathogens not detected by the test.  Clinical correlation with patient history and  other diagnostic information is necessary to determine patient infection status.  The expected result  is negative.  Fact Sheet for Patients:   StrictlyIdeas.no   Fact Sheet for Healthcare Providers:   BankingDealers.co.za    This test is not yet approved or cleared by the [place] FDA and   has been authorized for detection and/or diagnosis of SARS-CoV-2 by FDA under an Emergency Use Authorization (EUA).  This EUA will remain in effect (meaning this  test can be used) for the duration of  the COVID-19 declaration under Section 564(b)(1) of the Act, 21 U.S.C. section 360-bbb-3(b)(1), unless the authorization is terminated or revoked sooner.  Performed at [facility], [place] [address], [place], [place] [address]      Signed: Marlowe Aschoff Laiana Fratus  [facility] 05/15/2020, 10:28 AM

## 2020-05-14 NOTE — Progress Notes (Signed)
ANTICOAGULATION CONSULT NOTE   Pharmacy Consult for warfarin Indication: atrial fibrillation  No Known Allergies  Patient Measurements: Height: 5\' 9"  (175.3 cm) Weight: 92 kg (202 lb 13.2 oz) IBW/kg (Calculated) : 70.7  Vital Signs: Temp: 98.3 F (36.8 C) (01/27 0414) Temp Source: Oral (01/27 0414) BP: 162/91 (01/27 0616) Pulse Rate: 84 (01/27 0616)  Labs: Recent Labs    05/12/20 0518 05/13/20 0528 05/13/20 0906 05/14/20 0812  HGB 9.7*  --  10.7* 10.9*  HCT 31.1*  --  34.6* 35.3*  PLT 206  --  238 248  LABPROT 21.0* 18.8*  --  17.5*  INR 1.9* 1.6*  --  1.5*  CREATININE 2.03*  --  1.92*  --     Estimated Creatinine Clearance: 33.2 mL/min (A) (by C-G formula based on SCr of 1.92 mg/dL (H)).   Medical History: Past Medical History:  Diagnosis Date  . Hypertension    Assessment: 68 YOM presenting with fall, hx of afib on warfarin PTA.  INR on admission 2.9 (last dose 1/22), no reported head injury however pt underwent ORIF R humerus surgery on 1/25.   PTA dosing: 5mg  daily  05/14/2020  INR 1.5 today- sub-therapeutic after held 3 days and resumed yesterday  Post op day 2 ORIF R humerus  Hgb 10.9 (trending up) , PLT WNL, no bleeding reported  Goal of Therapy:  INR 2-3 Monitor platelets by anticoagulation protocol: Yes   Plan:  Repeat warfarin 7.5mg  x1  Continue bridge with enoxaparin 40mg  q24h until INR >2    Daily INR F/u CBC   Durward Fortes, PharmD Student 05/14/2020 9:09 AM

## 2020-05-14 NOTE — TOC Transition Note (Addendum)
Transition of Care [facility]) - CM/SW Discharge Note   Patient Details  Name: Nathan [name] MRN: [id number] Date of Birth: [birth year]  Transition of Care [facility]) CM/SW Contact:  Lia Hopping, [place] Phone Number: 05/14/2020, 1:47 PM   Clinical Narrative:    [facility] Medicare approved authorization 1/27-1/31 #[id number] Details provided to SNF staff Claiborne Billings.  Covid test pending.   Addendum-Patient covid test resulted positive. Patient unable to transfer to SNF.    Final next level of care: Skilled Nursing Facility Barriers to Discharge: Other (comment) (covid test)   Patient Goals and CMS Choice   CMS Medicare.gov Compare Post Acute Care list provided to:: Patient Choice offered to / list presented to : Patient  Discharge Placement                       Discharge Plan and Services In-house Referral: Clinical Social Work Discharge Planning Services: CM Consult                                 Social Determinants of Health (SDOH) Interventions     Readmission Risk Interventions No flowsheet data found.

## 2020-05-14 NOTE — TOC Transition Note (Signed)
Transition of Care [facility]) - CM/SW Discharge Note   Patient Details  Name: Nathan [name] MRN: [id number] Date of Birth: May 12, 1940  Transition of Care [facility]) CM/SW Contact:  Lia Hopping, [place] Phone Number: 05/14/2020, 11:47 AM   Clinical Narrative:    CSW provided bed offers. Patient and son chose [facility].  [employer] Medicare[employer] Reference#[id number] Covid test ordered yesterday, still pending. Facility will accept today pending negative covid test.    Final next level of care: Skilled Nursing Facility Barriers to Discharge: Other (comment),Insurance Authorization   Patient Goals and CMS Choice   CMS Medicare.gov Compare Post Acute Care list provided to:: Patient Choice offered to / list presented to : Patient  Discharge Placement                       Discharge Plan and Services In-house Referral: Clinical Social Work Discharge Planning Services: CM Consult                                 Social Determinants of Health (SDOH) Interventions     Readmission Risk Interventions No flowsheet data found.

## 2020-05-14 NOTE — Progress Notes (Incomplete)
Patient is refusing to get cleaned up after urinating on bed and he also refused am lab.  Will report to am nurse.Hatient has been confused.  It was reported he sun downs and he's been confused tonight. He was asking about getting his closed and leaving finding

## 2020-05-15 ENCOUNTER — Encounter: Payer: Self-pay | Admitting: Orthopaedic Surgery

## 2020-05-15 LAB — CBC WITH DIFFERENTIAL/PLATELET
Abs Immature Granulocytes: 0.06 10*3/uL (ref 0.00–0.07)
Basophils Absolute: 0 10*3/uL (ref 0.0–0.1)
Basophils Relative: 0 %
Eosinophils Absolute: 0.1 10*3/uL (ref 0.0–0.5)
Eosinophils Relative: 2 %
HCT: 31.8 % — ABNORMAL LOW (ref 39.0–52.0)
Hemoglobin: 10 g/dL — ABNORMAL LOW (ref 13.0–17.0)
Immature Granulocytes: 1 %
Lymphocytes Relative: 8 %
Lymphs Abs: 0.6 10*3/uL — ABNORMAL LOW (ref 0.7–4.0)
MCH: 30.5 pg (ref 26.0–34.0)
MCHC: 31.4 g/dL (ref 30.0–36.0)
MCV: 97 fL (ref 80.0–100.0)
Monocytes Absolute: 0.7 10*3/uL (ref 0.1–1.0)
Monocytes Relative: 9 %
Neutro Abs: 6.1 10*3/uL (ref 1.7–7.7)
Neutrophils Relative %: 80 %
Platelets: 216 10*3/uL (ref 150–400)
RBC: 3.28 MIL/uL — ABNORMAL LOW (ref 4.22–5.81)
RDW: 15.2 % (ref 11.5–15.5)
WBC: 7.5 10*3/uL (ref 4.0–10.5)
nRBC: 0 % (ref 0.0–0.2)

## 2020-05-15 LAB — BASIC METABOLIC PANEL
Anion gap: 12 (ref 5–15)
BUN: 50 mg/dL — ABNORMAL HIGH (ref 8–23)
CO2: 22 mmol/L (ref 22–32)
Calcium: 8.2 mg/dL — ABNORMAL LOW (ref 8.9–10.3)
Chloride: 107 mmol/L (ref 98–111)
Creatinine, Ser: 1.89 mg/dL — ABNORMAL HIGH (ref 0.61–1.24)
GFR, Estimated: 35 mL/min — ABNORMAL LOW (ref >60)
Glucose, Bld: 87 mg/dL (ref 70–99)
Potassium: 3.9 mmol/L (ref 3.5–5.1)
Sodium: 141 mmol/L (ref 135–145)

## 2020-05-15 LAB — PROTIME-INR
INR: 1.9 — ABNORMAL HIGH (ref 0.8–1.2)
Prothrombin Time: 21.4 seconds — ABNORMAL HIGH (ref 11.4–15.2)

## 2020-05-15 MED ORDER — WARFARIN SODIUM 5 MG PO TABS: 5.0000 mg | ORAL_TABLET | Freq: Every day | ORAL | Status: DC

## 2020-05-15 MED ORDER — METOPROLOL TARTRATE 50 MG PO TABS
50.0000 mg | ORAL_TABLET | Freq: Two times a day (BID) | ORAL | Status: DC
  Administered 2020-05-15: 50 mg via ORAL
  Filled 2020-05-15: qty 1

## 2020-05-15 NOTE — Progress Notes (Signed)
    Subjective: Patient reports pain as mild this morning.  Tolerating diet.  Urinating.  +Flatus.  No CP, SOB. Some confusion this morning.  Objective:   VITALS:   Vitals:   05/14/20 2054 05/14/20 2335 05/14/20 2335 05/15/20 0520  BP: (!) 150/100  (!) 177/90 (!) 164/89  Pulse: 91  100 98  Resp: 18  18 17   Temp: 98.3 F (36.8 C)  98.2 F (36.8 C) 98.2 F (36.8 C)  TempSrc: Oral     SpO2: 99%  99% 97%  Weight:  94.8 kg    Height:  5\' 9"  (1.753 m)     CBC Latest Ref Rng & Units 05/15/2020 05/14/2020 05/13/2020  WBC 4.0 - 10.5 K/uL 7.5 10.9(H) 8.2  Hemoglobin 13.0 - 17.0 g/dL 10.0(L) 10.9(L) 10.7(L)  Hematocrit 39.0 - 52.0 % 31.8(L) 35.3(L) 34.6(L)  Platelets 150 - 400 K/uL 216 248 238   BMP Latest Ref Rng & Units 05/15/2020 05/14/2020 05/13/2020  Glucose 70 - 99 mg/dL 87 98 108(H)  BUN 8 - 23 mg/dL 50(H) 49(H) 42(H)  Creatinine 0.61 - 1.24 mg/dL 1.89(H) 1.96(H) 1.92(H)  Sodium 135 - 145 mmol/L 141 142 143  Potassium 3.5 - 5.1 mmol/L 3.9 4.1 4.1  Chloride 98 - 111 mmol/L 107 109 111  CO2 22 - 32 mmol/L 22 19(L) 20(L)  Calcium 8.9 - 10.3 mg/dL 8.2(L) 8.5(L) 7.9(L)   Intake/Output      01/27 0701 01/28 0700 01/28 0701 01/29 0700   P.O. 660    I.V. (mL/kg) 0 (0)    IV Piggyback     Total Intake(mL/kg) 660 (7)    Urine (mL/kg/hr) 900 (0.4)    Stool 0    Total Output 900    Net -240         Urine Occurrence 4 x    Stool Occurrence 0 x       Physical Exam: General: NAD.  Resting in bed, alert. Somewhat confused. Resp: No increased wob Cardio: regular rate and rhythm ABD soft Neurologically intact MSK Neurovascularly intact Sensation intact distally Intact pulses distally Incision: dressing C/D/I Compartment soft  Assessment: 3 Days Post-Op  S/P Procedure(s) (LRB): OPEN REDUCTION INTERNAL FIXATION (ORIF) DISTAL HUMERUS FRACTURE (Right) by Dr. Ernesta Amble. Murphy on 05/12/20  Principal Problem:   Comminuted fracture of right humerus Active Problems:   Fall  at home, initial encounter   A-fib ([place])   Elevated serum creatinine  Plan:  Up with therapy Incentive Spirometry Elevate and Apply ice  Weightbearing: NWB RUE to the shoulder. Minimal WB to the right hand through the elbow.  Insicional and dressing care: Dressings left intact until follow-up Orthopedic device(s): sling Showering: Keep dressing dry VTE prophylaxis: Resume coumadin , Per pharmacy recs will do lovenox and coumadin until INR =>2 (1.9 this AM) SCDs, ambulation Pain control: Oxycodone, tylenol. Try and avoid IV meds if possible. Follow - up plan: 2 weeks Contact information:  Edmonia Lynch MD, Margy Clarks PA-C  Dispo: TBD. Pt. Tested COVID positive.  Will sign off for now. Please call with any questions.      Rachael Fee, PA-C 05/15/2020, 7:37 AM

## 2020-05-15 NOTE — Progress Notes (Signed)
ANTICOAGULATION CONSULT NOTE   Pharmacy Consult for warfarin Indication: atrial fibrillation  No Known Allergies  Patient Measurements: Height: 5\' 9"  (175.3 cm) Weight: 94.8 kg (208 lb 15.9 oz) IBW/kg (Calculated) : 70.7  Vital Signs: Temp: 97.3 F (36.3 C) (01/28 0852) Temp Source: Oral (01/28 0852) BP: 116/71 (01/28 0852) Pulse Rate: 97 (01/28 0852)  Labs: Recent Labs    05/13/20 0528 05/13/20 0906 05/13/20 0906 05/14/20 0812 05/15/20 0318  HGB  --  10.7*   < > 10.9* 10.0*  HCT  --  34.6*  --  35.3* 31.8*  PLT  --  238  --  248 216  LABPROT 18.8*  --   --  17.5* 21.4*  INR 1.6*  --   --  1.5* 1.9*  CREATININE  --  1.92*  --  1.96* 1.89*   < > = values in this interval not displayed.    Estimated Creatinine Clearance: 34.2 mL/min (A) (by C-G formula based on SCr of 1.89 mg/dL (H)).   Medical History: Past Medical History:  Diagnosis Date  . Hypertension    Assessment: [name] presenting with fall, hx of afib on warfarin PTA.  INR on admission 2.9 (last dose 1/22), no reported head injury however pt underwent ORIF R humerus surgery on 1/25.   PTA dosing: 5mg  daily  05/15/2020  INR 1.9 today- sub-therapeutic after held 3 days and resumed 1/26  Post op day 3 ORIF R humerus  Hgb 10.0, PLT WNL, no bleeding reported  Goal of Therapy:  INR 2-3 Monitor platelets by anticoagulation protocol: Yes   Plan:  Resume home warfarin 5mg  qd  Continue bridge with enoxaparin 40mg  q24h until INR >2    Daily INR F/u CBC    Nathan [name], PharmD Student 05/15/2020 10:25 AM

## 2020-05-15 NOTE — Progress Notes (Signed)
Attempted to call report to [facility] twice but have been unable to get anyone to answer. Number left on packet for them to call me to get report.

## 2020-05-15 NOTE — Progress Notes (Signed)
Patient was planned for discharge to SNF yesterday. It got canceled because he is COVID test came back positive. Briefly seen and examined this morning. Completely asymptomatic. Does not need inpatient stay for that. Per Education officer, museum, SNF able to take him with Covid positive status today. We will discharge him today. We will not bill for today's visit.

## 2020-05-15 NOTE — Progress Notes (Signed)
Physical Therapy Treatment Patient Details Name: Nathan [name] MRN: [id number] DOB: Feb 16, 1941 Today's Date: 05/15/2020    History of Present Illness Jetson Pickrel is a 80 y.o. male with PMH significant for history of atrial fibrillation on anticoagulation, hypertension.  Patient presented to the ED on 1/23 after a fall impacting his right shoulder on the ground. Patient now s/p ORIF right humerus 05/12/20.    PT Comments    Pt assisted with attempting sit to stands however unable to fully stand erect and required at least max assist.  Pt's LE strength grossly at best 2+/5 throughout.  Pt assisted with performing LE exercises.  Continue to recommend SNF upon d/c.   Follow Up Recommendations  SNF     Equipment Recommendations  Wheelchair cushion (measurements PT);Wheelchair (measurements PT)    Recommendations for Other Services       Precautions / Restrictions Precautions Precautions: Shoulder Type of Shoulder Precautions: No AROM, No PROM Shoulder Interventions: Shoulder sling/immobilizer;At all times;Off for dressing/bathing/exercises Precaution Booklet Issued: Yes (comment) Restrictions Weight Bearing Restrictions: Yes RUE Weight Bearing: Non weight bearing Other Position/Activity Restrictions: May bear 5 lbs to elbow    Mobility  Bed Mobility Overal bed mobility: Needs Assistance Bed Mobility: Supine to Sit     Supine to sit: Max assist;HOB elevated     General bed mobility comments: up in recliner on arrival  Transfers Overall transfer level: Needs assistance Equipment used: Hemi-walker Transfers: Sit to/from Stand Sit to Stand: Max assist;+2 physical assistance         General transfer comment: cues for holding hemiwalker, pt with weak LEs so required assist from lifting bed pad to raise hips and gait belt; pt unable to completely stand erect; attempted again with use of St[place] however pt requiring significant assist; was able to place lift pad under pt (to  assist with transfer back to bed with nursing)  Ambulation/Gait                 Stairs             Wheelchair Mobility    Modified Rankin (Stroke Patients Only)       Balance Overall balance assessment: Needs assistance Sitting-balance support: Single extremity supported;Feet supported Sitting balance-Leahy Scale: Poor Sitting balance - Comments: Leaning to the left - propping himself with left arm.   Standing balance support: Single extremity supported Standing balance-Leahy Scale: Zero                              Cognition Arousal/Alertness: Awake/alert Behavior During Therapy: WFL for tasks assessed/performed Overall Cognitive Status: Within Functional Limits for tasks assessed                                 General Comments: mostly appropriate, very [facility]      Exercises General Exercises - Lower Extremity Ankle Circles/Pumps: AROM;Both;10 reps Quad Sets: AROM;Both;10 reps Long Arc Quad: AAROM;Both;10 reps;Seated Hip Flexion/Marching: AAROM;Both;10 reps;Seated Other Exercises Other Exercises: Hip Hikes in chair x 5 each leg. Max Assist needed for RLE hikes due to weakness. Active assist for LLE.    General Comments        Pertinent Vitals/Pain Pain Assessment: Faces Faces Pain Scale: Hurts little more Pain Location: low back (with transfers) Pain Descriptors / Indicators: Grimacing Pain Intervention(s): Repositioned;Monitored during session    Home Living  Prior Function            PT Goals (current goals can now be found in the care plan section) Acute Rehab PT Goals Patient Stated Goal: To get moving better Progress towards PT goals: Progressing toward goals    Frequency    Min 2X/week      PT Plan Current plan remains appropriate    Co-evaluation              AM-PAC PT "6 Clicks" Mobility   Outcome Measure  Help needed turning from your back to your side while  in a flat bed without using bedrails?: A Lot Help needed moving from lying on your back to sitting on the side of a flat bed without using bedrails?: A Lot Help needed moving to and from a bed to a chair (including a wheelchair)?: A Lot Help needed standing up from a chair using your arms (e.g., wheelchair or bedside chair)?: A Lot Help needed to walk in hospital room?: Total Help needed climbing 3-5 steps with a railing? : Total 6 Click Score: 10    End of Session Equipment Utilized During Treatment: Gait belt Activity Tolerance: Patient tolerated treatment well Patient left: with call bell/phone within reach;in chair;with chair alarm set Nurse Communication: Mobility status;Need for lift equipment PT Visit Diagnosis: Unsteadiness on feet (R26.81);Other abnormalities of gait and mobility (R26.89);Muscle weakness (generalized) (M62.81)     Time: 6812-7517 PT Time Calculation (min) (ACUTE ONLY): 27 min  Charges:  $Therapeutic Exercise: 8-22 mins $Therapeutic Activity: 8-22 mins                     Jannette Spanner PT, DPT Acute Rehabilitation Services Pager: [phone] Office: [phone]  Simrat Kendrick,KATHrine E 05/15/2020, 1:15 PM

## 2020-05-15 NOTE — Telephone Encounter (Signed)
Patient did not pick up the phone. Voicemail box not set up. Note, patient recently had a fracture and underwent ORIF of the distal humerus 3 days ago on 05/12/2020. He was discharged earlier today.

## 2020-05-15 NOTE — TOC Transition Note (Addendum)
Transition of Care [facility]) - CM/SW Discharge Note   Patient Details  Name: Nathan [name] MRN: [id number] Date of Birth: 12-06-1940  Transition of Care [facility]) CM/SW Contact:  Lia Hopping, [place] Phone Number: 05/15/2020, 9:50 AM   Clinical Narrative:    [facility] has made accommodations to accept the the patient today.  CSW notified son Daniyal. Physician to update D/C Summary  Nurse call report to: [phone] Room 119  PTAR called   Final next level of care: Skilled Nursing Facility Barriers to Discharge: Barriers Resolved   Patient Goals and CMS Choice   CMS Medicare.gov Compare Post Acute Care list provided to:: Patient Choice offered to / list presented to : Patient  Discharge Placement PASRR number recieved: 05/13/20            Patient chooses bed at: [facility] Patient to be transferred to facility by: [place] Name of family member notified: [place] Patient and family notified of of transfer: 05/15/20  Discharge Plan and Services In-house Referral: Clinical Social Work Discharge Planning Services: CM Consult                                 Social Determinants of Health (SDOH) Interventions     Readmission Risk Interventions No flowsheet data found.

## 2020-05-15 NOTE — Progress Notes (Signed)
Occupational Therapy Treatment Patient Details Name: Nathan [name] MRN: [id number] DOB: 1940-07-04 Today's Date: 05/15/2020    History of present illness Nathan [name] is a 80 y.o. male with PMH significant for history of atrial fibrillation on anticoagulation, hypertension.  Patient presented to the ED on 1/23 after a fall impacting his right shoulder on the ground. Patient now s/p ORIF right humerus 05/12/20.   OT comments  Treatment focused on improving functional mobility and balance. Patient max assist to transfer to edge of bed. Patient leaning to the left at edge of bed needing to prop himself with his left hand or required external assistance from therapist. Despite cue to lean to the right and extended time at edge of bed - patient still leaning to the let. Attempted stand with hemiwalker x 2. Required significantly elevated bed height - close to high perch. Patient max assist to lift buttocks off the bed but could not extend trunk or take a step either time. Patient not able to significantly help with LUE. Patient required max pivot to recliner placed on his left and total assist to scoot back in chair. Therapist assisted patient with hip hikes while seated in recliner. Patient's hip strength very poor and needed assistance from therapist to lift. Patient motivated and very pleasant but significantly impaired by generalized weakness and NWB status of RUE. Continue to recommend short term rehab.   Follow Up Recommendations  SNF    Equipment Recommendations  3 in 1 bedside commode;Tub/shower seat    Recommendations for Other Services      Precautions / Restrictions Precautions Precautions: Shoulder Type of Shoulder Precautions: No AROM, No PROM Shoulder Interventions: Shoulder sling/immobilizer;At all times;Off for dressing/bathing/exercises Precaution Booklet Issued: Yes (comment) Restrictions Weight Bearing Restrictions: Yes RUE Weight Bearing: Non weight bearing Other  Position/Activity Restrictions: May bear 5 lbs to elbow       Mobility Bed Mobility Overal bed mobility: Needs Assistance Bed Mobility: Supine to Sit     Supine to sit: Max assist;HOB elevated     General bed mobility comments: Assistance for LEs and trunk. Patient unable to pull himself up with LUE and/or assist with trunk negotiation.  Transfers Overall transfer level: Needs assistance Equipment used: Hemi-walker Transfers: Sit to/from Nathan [name] Sit to Stand: From elevated surface;Max assist         General transfer comment: Attempted sit to stand x 2 with use of hemiwalker on left to stand. Required significantly elevated bed height to even initiate stand. Patient able to lift buttocks off bed with max assist but unable to extend trunk or take a step or use left upper extremity effectively. Performed max assist squat pivot to recliner placed on the left. Total assist to scoot back in chair.    Balance Overall balance assessment: Needs assistance Sitting-balance support: Single extremity supported;Feet supported Sitting balance-Nathan [name] Scale: Poor Sitting balance - Comments: Leaning to the left - propping himself with left arm.   Standing balance support: Single extremity supported Standing balance-Nathan [name] Scale: Zero                             ADL either performed or assessed with clinical judgement   ADL  Vision Patient Visual Report: No change from baseline     Perception     Praxis      Cognition Arousal/Alertness: Awake/alert Behavior During Therapy: WFL for tasks assessed/performed Overall Cognitive Status: Within Functional Limits for tasks assessed                                 General Comments: mostly appropriate, very [facility]        Exercises Other Exercises Other Exercises: Hip Hikes in chair x 5 each leg. Max Assist needed for RLE hikes due  to weakness. Active assist for LLE.   Shoulder Instructions       General Comments      Pertinent Vitals/ Pain       Pain Assessment: Faces Faces Pain Scale: Hurts little more Pain Location: low back (with transfers) Pain Descriptors / Indicators: Grimacing Pain Intervention(s): Limited activity within patient's tolerance  Home Living                                          Prior Functioning/Environment              Frequency  Min 2X/week        Progress Toward Goals  OT Goals(current goals can now be found in the care plan section)  Progress towards OT goals: OT to reassess next treatment  Acute Rehab OT Goals Patient Stated Goal: To get moving better OT Goal Formulation: With patient Time For Goal Achievement: 05/27/20 Potential to Achieve Goals: [place] Discharge plan remains appropriate    Co-evaluation                 AM-PAC OT "6 Clicks" Daily Activity     Outcome Measure   Help from another person eating meals?: A Little Help from another person taking care of personal grooming?: A Little Help from another person toileting, which includes using toliet, bedpan, or urinal?: A Lot Help from another person bathing (including washing, rinsing, drying)?: A Lot Help from another person to put on and taking off regular upper body clothing?: A Lot Help from another person to put on and taking off regular lower body clothing?: Total 6 Click Score: 13    End of Session Equipment Utilized During Treatment: Gait belt;Other (comment) (hemiwalker)  OT Visit Diagnosis: Unsteadiness on feet (R26.81);Other abnormalities of gait and mobility (R26.89);History of falling (Z91.81);Pain   Activity Tolerance Patient limited by fatigue   Patient Left in chair;with call bell/phone within reach;with chair alarm set   Nurse Communication Mobility status        Time: 9379-0240 OT Time Calculation (min): 34 min  Charges: OT General  Charges $OT Visit: 1 Visit OT Treatments $Self Care/Home Management : 23-37 mins  Julizza Sassone, OTR/L [place]  Office [phone] Pager: [place] 05/15/2020, 11:05 AM

## 2020-05-22 ENCOUNTER — Telehealth: Payer: Self-pay

## 2020-05-22 NOTE — Telephone Encounter (Signed)
Called and spoke with Baxter Flattery. She said that patient would need a cardiac clearance appt in the office to be cleared. When she called to set this up she was informed by patient's son that he recently fell and had to undergo surgery ORIF of right humerus on 05/12/2020 and was in recovery. When he has healed, he will set up clearance appointment.

## 2020-05-22 NOTE — Telephone Encounter (Signed)
thanks

## 2020-05-22 NOTE — Telephone Encounter (Signed)
Called the number listed for the patient Mr. Nathan [name] and his son Nathan [name] answered. I explained the reason for my call and Mr. Nathan [name] explained that his father had a bad fall and is recovering and that the knee surgery will have to wait due to his recovery time. He thanked me for calling.

## 2020-05-22 NOTE — Telephone Encounter (Addendum)
Laurin returned my called and I informed her that the patient would need an appointment for cardiac clearance. I also informed her when calling to get patient scheduled his son informed me that they are waiting to have the knee surgery due to recent fall and his father would need recovery time to heal from that. She thanked me for calling and will pass along the information.

## 2020-05-22 NOTE — Telephone Encounter (Signed)
Called the requesting office of [facility] and left a voice message on the triage line for someone to give me a call back at the office. I will also fax the notes to their office.

## 2020-05-22 NOTE — Telephone Encounter (Signed)
Baxter Flattery with [place] would like a call back concerning cardiac clearance form.  CB# [phone].  Please advise.  Thank you.

## 2020-05-22 NOTE — Telephone Encounter (Signed)
   Primary Cardiologist: Mertie Moores, MD  Chart reviewed as part of pre-operative protocol coverage. Multiple prior attempts have been made to contact the patient. I attempted to contact both patient and son listed on [facility] 2//22. No answer on either numbers and no option to leave a voicemail.   At this point he is due for a 6 month follow-up. Favor an office visit to assess his preop status at this time.  Pre-op covering staff: - Please schedule appointment and call patient and/or send a letter in the mail to inform them. Please add "pre-op clearance" to the appointment notes so provider is aware. - Please contact requesting surgeon's office via preferred method (i.e, phone, fax) to inform them of need for appointment prior to surgery.   Abigail Butts, PA-C  05/22/2020, 11:40 AM

## 2020-05-29 ENCOUNTER — Emergency Department (HOSPITAL_COMMUNITY): Payer: Medicare Other

## 2020-05-29 ENCOUNTER — Encounter (HOSPITAL_COMMUNITY): Payer: Self-pay

## 2020-05-29 ENCOUNTER — Inpatient Hospital Stay (HOSPITAL_COMMUNITY)
Admission: EM | Admit: 2020-05-29 | Discharge: 2020-06-11 | DRG: 177 | Disposition: A | Discharge status: Hospice | Payer: Medicare Other | Source: Skilled Nursing Facility | Attending: Family Medicine | Admitting: Family Medicine

## 2020-05-29 DIAGNOSIS — Z7189 Other specified counseling: Secondary | ICD-10-CM | POA: Diagnosis not present

## 2020-05-29 DIAGNOSIS — D6832 Hemorrhagic disorder due to extrinsic circulating anticoagulants: Secondary | ICD-10-CM

## 2020-05-29 DIAGNOSIS — R791 Abnormal coagulation profile: Secondary | ICD-10-CM

## 2020-05-29 DIAGNOSIS — R159 Full incontinence of feces: Secondary | ICD-10-CM | POA: Diagnosis present

## 2020-05-29 DIAGNOSIS — N189 Chronic kidney disease, unspecified: Secondary | ICD-10-CM | POA: Diagnosis not present

## 2020-05-29 DIAGNOSIS — R0902 Hypoxemia: Secondary | ICD-10-CM | POA: Diagnosis not present

## 2020-05-29 DIAGNOSIS — J189 Pneumonia, unspecified organism: Secondary | ICD-10-CM | POA: Diagnosis not present

## 2020-05-29 DIAGNOSIS — I4821 Permanent atrial fibrillation: Secondary | ICD-10-CM | POA: Diagnosis present

## 2020-05-29 DIAGNOSIS — E872 Acidosis, unspecified: Secondary | ICD-10-CM | POA: Diagnosis present

## 2020-05-29 DIAGNOSIS — J1282 Pneumonia due to coronavirus disease 2019: Secondary | ICD-10-CM | POA: Diagnosis present

## 2020-05-29 DIAGNOSIS — R54 Age-related physical debility: Secondary | ICD-10-CM | POA: Diagnosis present

## 2020-05-29 DIAGNOSIS — I10 Essential (primary) hypertension: Secondary | ICD-10-CM | POA: Diagnosis not present

## 2020-05-29 DIAGNOSIS — I959 Hypotension, unspecified: Secondary | ICD-10-CM | POA: Diagnosis present

## 2020-05-29 DIAGNOSIS — E870 Hyperosmolality and hypernatremia: Secondary | ICD-10-CM | POA: Diagnosis not present

## 2020-05-29 DIAGNOSIS — Z96641 Presence of right artificial hip joint: Secondary | ICD-10-CM | POA: Diagnosis present

## 2020-05-29 DIAGNOSIS — R4182 Altered mental status, unspecified: Secondary | ICD-10-CM | POA: Diagnosis not present

## 2020-05-29 DIAGNOSIS — L89613 Pressure ulcer of right heel, stage 3: Secondary | ICD-10-CM | POA: Diagnosis present

## 2020-05-29 DIAGNOSIS — Z515 Encounter for palliative care: Secondary | ICD-10-CM

## 2020-05-29 DIAGNOSIS — N390 Urinary tract infection, site not specified: Secondary | ICD-10-CM | POA: Diagnosis not present

## 2020-05-29 DIAGNOSIS — N1832 Chronic kidney disease, stage 3b: Secondary | ICD-10-CM | POA: Diagnosis not present

## 2020-05-29 DIAGNOSIS — Z66 Do not resuscitate: Secondary | ICD-10-CM | POA: Diagnosis not present

## 2020-05-29 DIAGNOSIS — E441 Mild protein-calorie malnutrition: Secondary | ICD-10-CM | POA: Diagnosis present

## 2020-05-29 DIAGNOSIS — I4891 Unspecified atrial fibrillation: Secondary | ICD-10-CM | POA: Diagnosis not present

## 2020-05-29 DIAGNOSIS — I482 Chronic atrial fibrillation, unspecified: Secondary | ICD-10-CM | POA: Diagnosis not present

## 2020-05-29 DIAGNOSIS — G9341 Metabolic encephalopathy: Secondary | ICD-10-CM | POA: Diagnosis not present

## 2020-05-29 DIAGNOSIS — Z79899 Other long term (current) drug therapy: Secondary | ICD-10-CM

## 2020-05-29 DIAGNOSIS — E871 Hypo-osmolality and hyponatremia: Secondary | ICD-10-CM | POA: Diagnosis present

## 2020-05-29 DIAGNOSIS — Z6828 Body mass index (BMI) 28.0-28.9, adult: Secondary | ICD-10-CM | POA: Diagnosis not present

## 2020-05-29 DIAGNOSIS — N400 Enlarged prostate without lower urinary tract symptoms: Secondary | ICD-10-CM | POA: Diagnosis present

## 2020-05-29 DIAGNOSIS — U071 COVID-19: Principal | ICD-10-CM

## 2020-05-29 DIAGNOSIS — R410 Disorientation, unspecified: Secondary | ICD-10-CM | POA: Diagnosis not present

## 2020-05-29 DIAGNOSIS — L8990 Pressure ulcer of unspecified site, unspecified stage: Secondary | ICD-10-CM | POA: Insufficient documentation

## 2020-05-29 DIAGNOSIS — E869 Volume depletion, unspecified: Secondary | ICD-10-CM | POA: Diagnosis present

## 2020-05-29 DIAGNOSIS — T45515A Adverse effect of anticoagulants, initial encounter: Secondary | ICD-10-CM | POA: Diagnosis not present

## 2020-05-29 DIAGNOSIS — E8809 Other disorders of plasma-protein metabolism, not elsewhere classified: Secondary | ICD-10-CM | POA: Diagnosis present

## 2020-05-29 DIAGNOSIS — Z885 Allergy status to narcotic agent status: Secondary | ICD-10-CM

## 2020-05-29 DIAGNOSIS — J9601 Acute respiratory failure with hypoxia: Secondary | ICD-10-CM | POA: Diagnosis not present

## 2020-05-29 DIAGNOSIS — R64 Cachexia: Secondary | ICD-10-CM | POA: Diagnosis present

## 2020-05-29 DIAGNOSIS — M109 Gout, unspecified: Secondary | ICD-10-CM | POA: Diagnosis present

## 2020-05-29 DIAGNOSIS — Z96651 Presence of right artificial knee joint: Secondary | ICD-10-CM | POA: Diagnosis present

## 2020-05-29 DIAGNOSIS — Z833 Family history of diabetes mellitus: Secondary | ICD-10-CM

## 2020-05-29 DIAGNOSIS — D689 Coagulation defect, unspecified: Secondary | ICD-10-CM | POA: Diagnosis present

## 2020-05-29 DIAGNOSIS — Z7901 Long term (current) use of anticoagulants: Secondary | ICD-10-CM

## 2020-05-29 DIAGNOSIS — Y929 Unspecified place or not applicable: Secondary | ICD-10-CM

## 2020-05-29 DIAGNOSIS — N179 Acute kidney failure, unspecified: Secondary | ICD-10-CM | POA: Diagnosis present

## 2020-05-29 DIAGNOSIS — Z8249 Family history of ischemic heart disease and other diseases of the circulatory system: Secondary | ICD-10-CM

## 2020-05-29 DIAGNOSIS — R630 Anorexia: Secondary | ICD-10-CM | POA: Diagnosis present

## 2020-05-29 DIAGNOSIS — T148XXA Other injury of unspecified body region, initial encounter: Secondary | ICD-10-CM

## 2020-05-29 LAB — BLOOD GAS, ARTERIAL
Acid-base deficit: 3.4 mmol/L — ABNORMAL HIGH (ref 0.0–2.0)
Bicarbonate: 18.8 mmol/L — ABNORMAL LOW (ref 20.0–28.0)
Collection site: 331471
FIO2: 36
O2 Saturation: 89.2 %
Patient temperature: 98.6
pCO2 arterial: 26.7 mmHg — ABNORMAL LOW (ref 32.0–48.0)
pH, Arterial: 7.463 — ABNORMAL HIGH (ref 7.350–7.450)
pO2, Arterial: 56.6 mmHg — ABNORMAL LOW (ref 83.0–108.0)

## 2020-05-29 LAB — CBC WITH DIFFERENTIAL/PLATELET
Abs Immature Granulocytes: 0.07 10*3/uL (ref 0.00–0.07)
Basophils Absolute: 0 10*3/uL (ref 0.0–0.1)
Basophils Relative: 0 %
Eosinophils Absolute: 0.1 10*3/uL (ref 0.0–0.5)
Eosinophils Relative: 1 %
HCT: 34.2 % — ABNORMAL LOW (ref 39.0–52.0)
Hemoglobin: 10 g/dL — ABNORMAL LOW (ref 13.0–17.0)
Immature Granulocytes: 1 %
Lymphocytes Relative: 8 %
Lymphs Abs: 0.7 10*3/uL (ref 0.7–4.0)
MCH: 29 pg (ref 26.0–34.0)
MCHC: 29.2 g/dL — ABNORMAL LOW (ref 30.0–36.0)
MCV: 99.1 fL (ref 80.0–100.0)
Monocytes Absolute: 0.4 10*3/uL (ref 0.1–1.0)
Monocytes Relative: 5 %
Neutro Abs: 7.4 10*3/uL (ref 1.7–7.7)
Neutrophils Relative %: 85 %
Platelets: 363 10*3/uL (ref 150–400)
RBC: 3.45 MIL/uL — ABNORMAL LOW (ref 4.22–5.81)
RDW: 15.5 % (ref 11.5–15.5)
WBC: 8.7 10*3/uL (ref 4.0–10.5)
nRBC: 0 % (ref 0.0–0.2)

## 2020-05-29 LAB — COMPREHENSIVE METABOLIC PANEL
ALT: 14 U/L (ref 0–44)
AST: 23 U/L (ref 15–41)
Albumin: 2.3 g/dL — ABNORMAL LOW (ref 3.5–5.0)
Alkaline Phosphatase: 91 U/L (ref 38–126)
Anion gap: 14 (ref 5–15)
BUN: 72 mg/dL — ABNORMAL HIGH (ref 8–23)
CO2: 21 mmol/L — ABNORMAL LOW (ref 22–32)
Calcium: 8.1 mg/dL — ABNORMAL LOW (ref 8.9–10.3)
Chloride: 118 mmol/L — ABNORMAL HIGH (ref 98–111)
Creatinine, Ser: 2.29 mg/dL — ABNORMAL HIGH (ref 0.61–1.24)
GFR, Estimated: 28 mL/min — ABNORMAL LOW (ref >60)
Glucose, Bld: 115 mg/dL — ABNORMAL HIGH (ref 70–99)
Potassium: 4 mmol/L (ref 3.5–5.1)
Sodium: 153 mmol/L — ABNORMAL HIGH (ref 135–145)
Total Bilirubin: 1.3 mg/dL — ABNORMAL HIGH (ref 0.3–1.2)
Total Protein: 6.7 g/dL (ref 6.5–8.1)

## 2020-05-29 LAB — PROTIME-INR
INR: 8.8 (ref 0.8–1.2)
Prothrombin Time: 69.9 seconds — ABNORMAL HIGH (ref 11.4–15.2)

## 2020-05-29 LAB — URINALYSIS, ROUTINE W REFLEX MICROSCOPIC
Bilirubin Urine: NEGATIVE
Glucose, UA: NEGATIVE mg/dL
Ketones, ur: NEGATIVE mg/dL
Nitrite: NEGATIVE
Protein, ur: 100 mg/dL — AB
Specific Gravity, Urine: 1.015 (ref 1.005–1.030)
WBC, UA: >50 WBC/hpf — ABNORMAL HIGH (ref 0–5)
pH: 7 (ref 5.0–8.0)

## 2020-05-29 LAB — LACTIC ACID, PLASMA
Lactic Acid, Venous: 1.6 mmol/L (ref 0.5–1.9)
Lactic Acid, Venous: 2.5 mmol/L (ref 0.5–1.9)
Lactic Acid, Venous: 1.7 mmol/L (ref 0.5–1.9)

## 2020-05-29 LAB — D-DIMER, QUANTITATIVE: D-Dimer, Quant: 2.14 ug/mL-FEU — ABNORMAL HIGH (ref 0.00–0.50)

## 2020-05-29 LAB — APTT: aPTT: 93 seconds — ABNORMAL HIGH (ref 24–36)

## 2020-05-29 LAB — TROPONIN I (HIGH SENSITIVITY)
Troponin I (High Sensitivity): 16 ng/L (ref <18)
Troponin I (High Sensitivity): 16 ng/L (ref <18)

## 2020-05-29 LAB — C-REACTIVE PROTEIN: CRP: 13.5 mg/dL — ABNORMAL HIGH (ref <1.0)

## 2020-05-29 LAB — CBG MONITORING, ED: Glucose-Capillary: 95 mg/dL (ref 70–99)

## 2020-05-29 LAB — PROCALCITONIN: Procalcitonin: 0.4 ng/mL

## 2020-05-29 LAB — BRAIN NATRIURETIC PEPTIDE: B Natriuretic Peptide: 136 pg/mL — ABNORMAL HIGH (ref 0.0–100.0)

## 2020-05-29 MED ORDER — SODIUM CHLORIDE 0.9 % IV SOLN
200.0000 mg | Freq: Once | INTRAVENOUS | Status: AC
  Administered 2020-05-29: 200 mg via INTRAVENOUS
  Filled 2020-05-29: qty 200

## 2020-05-29 MED ORDER — METHYLPREDNISOLONE SODIUM SUCC 125 MG IJ SOLR
0.5000 mg/kg | Freq: Two times a day (BID) | INTRAMUSCULAR | Status: DC
  Administered 2020-05-29: 47.5 mg via INTRAVENOUS
  Filled 2020-05-29: qty 2

## 2020-05-29 MED ORDER — METOPROLOL TARTRATE 50 MG PO TABS
50.0000 mg | ORAL_TABLET | Freq: Two times a day (BID) | ORAL | Status: DC
  Administered 2020-05-30 – 2020-06-09 (×18): 50 mg via ORAL
  Filled 2020-05-29 (×21): qty 1

## 2020-05-29 MED ORDER — ASCORBIC ACID 500 MG PO TABS
500.0000 mg | ORAL_TABLET | Freq: Every day | ORAL | Status: DC
  Administered 2020-05-30 – 2020-06-10 (×12): 500 mg via ORAL
  Filled 2020-05-29 (×12): qty 1

## 2020-05-29 MED ORDER — ALBUTEROL SULFATE HFA 108 (90 BASE) MCG/ACT IN AERS
2.0000 | INHALATION_SPRAY | RESPIRATORY_TRACT | Status: DC | PRN
  Filled 2020-05-29: qty 6.7

## 2020-05-29 MED ORDER — LACTATED RINGERS IV SOLN: INTRAVENOUS | Status: DC

## 2020-05-29 MED ORDER — DILTIAZEM HCL ER COATED BEADS 120 MG PO CP24
120.0000 mg | ORAL_CAPSULE | Freq: Every day | ORAL | Status: DC
  Administered 2020-05-30 – 2020-06-09 (×11): 120 mg via ORAL
  Filled 2020-05-29 (×12): qty 1

## 2020-05-29 MED ORDER — ZINC SULFATE 220 (50 ZN) MG PO CAPS
220.0000 mg | ORAL_CAPSULE | Freq: Every day | ORAL | Status: DC
  Administered 2020-05-30 – 2020-06-10 (×12): 220 mg via ORAL
  Filled 2020-05-29 (×12): qty 1

## 2020-05-29 MED ORDER — PREDNISONE 50 MG PO TABS: 50.0000 mg | ORAL_TABLET | Freq: Every day | ORAL | Status: DC

## 2020-05-29 MED ORDER — VANCOMYCIN HCL 1750 MG/350ML IV SOLN
1750.0000 mg | Freq: Once | INTRAVENOUS | Status: AC
  Administered 2020-05-29: 1750 mg via INTRAVENOUS
  Filled 2020-05-29: qty 350

## 2020-05-29 MED ORDER — BARICITINIB 1 MG PO TABS
1.0000 mg | ORAL_TABLET | Freq: Every day | ORAL | Status: DC
  Administered 2020-05-30: 1 mg via ORAL
  Filled 2020-05-29: qty 1

## 2020-05-29 MED ORDER — SODIUM CHLORIDE 0.9 % IV SOLN
2.0000 g | Freq: Once | INTRAVENOUS | Status: AC
  Administered 2020-05-29: 2 g via INTRAVENOUS
  Filled 2020-05-29: qty 2

## 2020-05-29 MED ORDER — SODIUM CHLORIDE 0.9 % IV BOLUS
1000.0000 mL | Freq: Once | INTRAVENOUS | Status: AC
  Administered 2020-05-29: 1000 mL via INTRAVENOUS

## 2020-05-29 MED ORDER — LACTATED RINGERS IV BOLUS
500.0000 mL | Freq: Once | INTRAVENOUS | Status: AC
  Administered 2020-05-30: 500 mL via INTRAVENOUS

## 2020-05-29 MED ORDER — SODIUM CHLORIDE 0.9 % IV SOLN
100.0000 mg | Freq: Every day | INTRAVENOUS | Status: AC
  Administered 2020-05-30 – 2020-06-02 (×4): 100 mg via INTRAVENOUS
  Filled 2020-05-29 (×4): qty 20

## 2020-05-29 MED ORDER — SODIUM CHLORIDE 0.45 % IV SOLN: INTRAVENOUS | Status: AC

## 2020-05-29 MED ORDER — VITAMIN K1 10 MG/ML IJ SOLN
1.0000 mg | INTRAVENOUS | Status: AC
  Administered 2020-05-29: 1 mg via INTRAVENOUS
  Filled 2020-05-29: qty 0.1

## 2020-05-29 MED ORDER — BARICITINIB 2 MG PO TABS: 2.0000 mg | ORAL_TABLET | Freq: Every day | ORAL | Status: DC

## 2020-05-29 MED ORDER — FINASTERIDE 5 MG PO TABS
5.0000 mg | ORAL_TABLET | Freq: Every day | ORAL | Status: DC
  Administered 2020-05-30 – 2020-06-10 (×12): 5 mg via ORAL
  Filled 2020-05-29 (×12): qty 1

## 2020-05-29 MED ORDER — SODIUM CHLORIDE 0.9 % IV SOLN
1.0000 g | INTRAVENOUS | Status: DC
  Administered 2020-05-30 – 2020-06-01 (×3): 1 g via INTRAVENOUS
  Filled 2020-05-29 (×3): qty 10

## 2020-05-29 MED ORDER — MELATONIN 3 MG PO TABS
3.0000 mg | ORAL_TABLET | Freq: Every evening | ORAL | Status: DC | PRN
  Administered 2020-05-30 – 2020-06-09 (×7): 3 mg via ORAL
  Filled 2020-05-29 (×7): qty 1

## 2020-05-29 MED ORDER — ONDANSETRON HCL 4 MG/2ML IJ SOLN
4.0000 mg | Freq: Four times a day (QID) | INTRAMUSCULAR | Status: DC | PRN
Start: 2020-05-29 — End: 2020-06-10

## 2020-05-29 MED ORDER — GUAIFENESIN-DM 100-10 MG/5ML PO SYRP
10.0000 mL | ORAL_SOLUTION | ORAL | Status: DC | PRN
  Administered 2020-05-31: 10 mL via ORAL
  Filled 2020-05-29 (×2): qty 10

## 2020-05-29 MED ORDER — ONDANSETRON HCL 4 MG PO TABS: 4.0000 mg | ORAL_TABLET | Freq: Four times a day (QID) | ORAL | Status: DC | PRN

## 2020-05-29 MED ORDER — ALLOPURINOL 100 MG PO TABS
100.0000 mg | ORAL_TABLET | Freq: Every day | ORAL | Status: DC
  Administered 2020-05-30 – 2020-06-10 (×12): 100 mg via ORAL
  Filled 2020-05-29 (×12): qty 1

## 2020-05-29 MED ORDER — ACETAMINOPHEN 325 MG PO TABS
650.0000 mg | ORAL_TABLET | Freq: Four times a day (QID) | ORAL | Status: DC | PRN
  Administered 2020-06-02 – 2020-06-09 (×4): 650 mg via ORAL
  Filled 2020-05-29 (×5): qty 2

## 2020-05-29 MED ORDER — VANCOMYCIN HCL IN DEXTROSE 1-5 GM/200ML-% IV SOLN: 1000.0000 mg | Freq: Once | INTRAVENOUS | Status: DC

## 2020-05-29 MED ORDER — POLYETHYLENE GLYCOL 3350 17 G PO PACK: 17.0000 g | PACK | Freq: Every day | ORAL | Status: DC | PRN

## 2020-05-29 NOTE — ED Provider Notes (Addendum)
[facility] DEPT Provider Note   CSN: [id number] Arrival date & time: 05/29/20  1653  LEVEL 5 CAVEAT - ALTERED MENTAL STATUS  History Chief Complaint  Patient presents with  . Altered Mental Status    Nathan [name] is a 80 y.o. male.  HPI 80 year old male presents via EMS with altered mental status.  History is primarily from EMS.  The patient has been altered for 2 days according to the facility but much worse today.  EMS also found him to be hypoxic to 80% and put him on 4 L.  He had hypotension and most recent blood pressure was 90 after being given 500 cc IV fluids.  Patient was just admitted and discharged after a fall and humerus fracture that was repaired.   Past Medical History:  Diagnosis Date  . Cancer ([place])    skin  . Chronic renal impairment   . Diverticulosis   . DJD (degenerative joint disease)   . Elevated PSA    Dr Junious Silk  . H/O hiatal hernia   . Hypertension   . 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 Permanent atrial fibrillation ([place])   . PONV (postoperative nausea and vomiting)    no  problems recently    Patient Active Problem List   Diagnosis Date Noted  . Acute hypoxemic respiratory failure due to COVID-19 ([place]) 05/29/2020  . Hypernatremia 05/29/2020  . Urinary tract infection 05/29/2020  . Lactic acidosis 05/29/2020  . Comminuted fracture of right humerus 05/10/2020  . Fall at home, initial encounter 05/10/2020  . Atrial fibrillation, chronic ([place]) 05/10/2020  . Elevated serum creatinine 05/10/2020  . Unilateral primary osteoarthritis, left knee 05/05/2020  . Tinea corporis 10/06/2019  . LVH (left ventricular hypertrophy) due to hypertensive disease, with heart failure ([place]) 06/10/2019  . Thoracic aortic aneurysm, without rupture ([place]) 12/31/2018  . Long term (current) use of anticoagulants 01/06/2017  . Eczema, dyshidrotic 09/07/2016  .  Warfarin-induced coagulopathy ([place]) 01/20/2015  . Acute renal failure superimposed on stage 3b chronic kidney disease ([place]) 01/20/2015  . Abnormality of gait due to impairment of balance 10/30/2014  . Hypertriglyceridemia 03/04/2014  . Encounter for therapeutic drug monitoring 05/22/2013  . Vitamin D deficiency 11/16/2012  . Gout 11/16/2012  . Chronic kidney disease, stage 3b ([place]) 03/18/2009  . OSTEOARTHRITIS 03/18/2009  . RENAL CYST 06/14/2007  . Essential hypertension 09/04/2006  . Deficiency anemia 08/31/2006    Past Surgical History:  Procedure Laterality Date  . COLONOSCOPY  2003  . HERNIA REPAIR    . HIATAL HERNIA REPAIR     Dr Kaylyn Lim , laparoscopic  . INGUINAL HERNIA REPAIR Left 03/05/2013   Procedure: HERNIA REPAIR INGUINAL ADULT;  Surgeon: [name], MD;  Location: [facility];  Service: General;  Laterality: Left;  . INSERTION OF MESH Left 03/05/2013   Procedure: INSERTION OF MESH;  Surgeon: [name], MD;  Location: [facility];  Service: General;  Laterality: Left;  . ORIF HUMERUS FRACTURE Right 05/12/2020   Procedure: OPEN REDUCTION INTERNAL FIXATION (ORIF) DISTAL HUMERUS FRACTURE;  Surgeon: [name], MD;  Location: [facility];  Service: Orthopedics;  Laterality: Right;  . TOTAL HIP ARTHROPLASTY Right 07  . TOTAL KNEE ARTHROPLASTY Right 07       Family History  Problem Relation Age of Onset  . Hypertension Mother   . Skin cancer Mother   . Brain cancer Sister   .  Hypertension Brother   . Diabetes Brother   . Stroke Neg Hx   . Heart disease Neg Hx     Social History   Tobacco Use  . Smoking status: Never Smoker  . Smokeless tobacco: Never Used  Vaping Use  . Vaping Use: Never used  Substance Use Topics  . Alcohol use: Never    Comment: occasionally  . Drug use: Never    Home Medications Prior to Admission medications   Medication Sig Start Date End Date Taking? Authorizing Provider  acetaminophen (TYLENOL) 325 MG tablet Take 975 mg  by mouth daily.    [provider]  acetaminophen (TYLENOL) 650 MG CR tablet Take 650 mg by mouth every 8 (eight) hours as needed for pain.    [provider]  allopurinol (ZYLOPRIM) 100 MG tablet TAKE 1 TABLET(100 MG) BY MOUTH DAILY 12/23/19   Janith Lima, MD  allopurinol (ZYLOPRIM) 100 MG tablet Take 100 mg by mouth daily. 03/24/20   [provider]  alum & mag hydroxide-simeth (MAALOX/MYLANTA) 200-200-20 MG/5ML suspension Take 15 mLs by mouth every 6 (six) hours as needed for indigestion or heartburn. 05/14/20   [name], MD  bisacodyl (DULCOLAX) 10 MG suppository Place 1 suppository (10 mg total) rectally daily as needed for moderate constipation. 05/14/20   [name], MD  diltiazem (CARDIZEM CD) 120 MG 24 hr capsule TAKE 1 CAPSULE BY MOUTH EVERY DAY 02/06/20   [name], [name], MD  diltiazem (CARDIZEM CD) 120 MG 24 hr capsule Take 120 mg by mouth daily. 05/06/20   [provider]  finasteride (PROSCAR) 5 MG tablet Take 5 mg by mouth daily. Rx'ed by [name]    [provider]  finasteride (PROSCAR) 5 MG tablet Take 5 mg by mouth daily. 03/04/20   [provider]  furosemide (LASIX) 40 MG tablet TAKE 1 TABLET(40 MG) BY MOUTH TWICE DAILY 02/01/20   Janith Lima, MD  furosemide (LASIX) 40 MG tablet Take 1 tablet (40 mg total) by mouth daily. 05/15/20   [name], MD  levocetirizine (XYZAL) 5 MG tablet TAKE 1 TABLET(5 MG) BY MOUTH EVERY EVENING 12/19/19   Janith Lima, MD  levocetirizine (XYZAL) 5 MG tablet Take 5 mg by mouth at bedtime as needed for allergies. 03/16/20   [provider]  lisinopril (ZESTRIL) 40 MG tablet Take 40 mg by mouth daily. 12/24/19   [provider]  Melatonin 3 MG CAPS Take 1 capsule (3 mg total) by mouth at bedtime as needed. 05/14/20   [name], MD  metoprolol tartrate (LOPRESSOR) 50 MG tablet Take 1 tablet (50 mg total) by mouth 2 (two) times daily. 12/16/19   [name], [name], MD   metoprolol tartrate (LOPRESSOR) 50 MG tablet Take 50 mg by mouth 2 (two) times daily. 03/13/20   [provider]  oxyCODONE-acetaminophen (PERCOCET) 5-325 MG tablet Take 1 tablet by mouth every 6 (six) hours as needed for up to 20 days for severe pain. 05/14/20 06/03/20  [name], MD  polyethylene glycol (MIRALAX / GLYCOLAX) 17 g packet Take 17 g by mouth daily as needed for mild constipation. 05/14/20   Dahal, Marlowe Aschoff, MD  senna-docusate (SENOKOT-S) 8.6-50 MG tablet Take 1 tablet by mouth at bedtime as needed for mild constipation. 05/14/20   [name], MD  warfarin (COUMADIN) 5 MG tablet TAKE 1/2 TABLET BY MOUTH EVERY DAY EXCEPT TAKE 1 TABLET ON MONDAY, [facility] AND FRIDAY OR AS DIRECTED BY ANTIOAGULATION CLINIC 03/02/20   Ronnald Ramp,  Arvid Right, MD  warfarin (COUMADIN) 5 MG tablet Take 5 mg by mouth daily. 04/07/20   [provider]    Allergies    Tramadol  Review of Systems   Review of Systems  Unable to perform ROS: Mental status change    Physical Exam Updated Vital Signs BP 108/66   Pulse 79   Temp 99.4 F (37.4 C) (Rectal)   Resp (!) 26   Ht 5\' 9"  (1.753 m)   Wt 94.8 kg   SpO2 95%   BMI 30.86 kg/m   Physical Exam Vitals and nursing note reviewed.  Constitutional:      Appearance: He is well-developed and well-nourished.  HENT:     Head: Normocephalic and atraumatic.     Right Ear: External ear normal.     Left Ear: External ear normal.     Nose: Nose normal.  Eyes:     General:        Right eye: No discharge.        Left eye: No discharge.  Cardiovascular:     Rate and Rhythm: Normal rate and regular rhythm.     Heart sounds: Normal heart sounds.  Pulmonary:     Effort: Pulmonary effort is normal.     Breath sounds: Normal breath sounds.  Abdominal:     General: There is no distension.     Palpations: Abdomen is soft.     Tenderness: There is no abdominal tenderness.  Musculoskeletal:        General: No edema.     Cervical back: Neck  supple.  Skin:    General: Skin is warm and dry.     Comments: Right arm surgical wounds are C/D/I  Neurological:     Mental Status: He is alert.     Comments: Awake, alert, mildly disoriented. Some slurred speech. Equal strength in BUE, 4/5 strength in BLE.   Psychiatric:        Mood and Affect: Mood is not anxious.     ED Results / Procedures / Treatments   Labs (all labs ordered are listed, but only abnormal results are displayed) Labs Reviewed  LACTIC ACID, PLASMA - Abnormal; Notable for the following components:      Result Value   Lactic Acid, Venous 2.5 (*)    All other components within normal limits  COMPREHENSIVE METABOLIC PANEL - Abnormal; Notable for the following components:   Sodium 153 (*)    Chloride 118 (*)    CO2 21 (*)    Glucose, Bld 115 (*)    BUN 72 (*)    Creatinine, Ser 2.29 (*)    Calcium 8.1 (*)    Albumin 2.3 (*)    Total Bilirubin 1.3 (*)    GFR, Estimated 28 (*)    All other components within normal limits  CBC WITH DIFFERENTIAL/PLATELET - Abnormal; Notable for the following components:   RBC 3.45 (*)    Hemoglobin 10.0 (*)    HCT 34.2 (*)    MCHC 29.2 (*)    All other components within normal limits  PROTIME-INR - Abnormal; Notable for the following components:   Prothrombin Time 69.9 (*)    INR 8.8 (*)    All other components within normal limits  APTT - Abnormal; Notable for the following components:   aPTT 93 (*)    All other components within normal limits  URINALYSIS, ROUTINE W REFLEX MICROSCOPIC - Abnormal; Notable for the following components:   APPearance HAZY (*)  Hgb urine dipstick SMALL (*)    Protein, ur 100 (*)    Leukocytes,Ua LARGE (*)    WBC, UA >50 (*)    Bacteria, UA RARE (*)    All other components within normal limits  BRAIN NATRIURETIC PEPTIDE - Abnormal; Notable for the following components:   B Natriuretic Peptide 136.0 (*)    All other components within normal limits  BLOOD GAS, ARTERIAL - Abnormal;  Notable for the following components:   pH, Arterial 7.463 (*)    pCO2 arterial 26.7 (*)    pO2, Arterial 56.6 (*)    Bicarbonate 18.8 (*)    Acid-base deficit 3.4 (*)    All other components within normal limits  C-REACTIVE PROTEIN - Abnormal; Notable for the following components:   CRP 13.5 (*)    All other components within normal limits  URINE CULTURE  CULTURE, BLOOD (ROUTINE X 2)  CULTURE, BLOOD (ROUTINE X 2)  LACTIC ACID, PLASMA  PROCALCITONIN  D-DIMER, QUANTITATIVE (NOT AT [facility])  LACTIC ACID, PLASMA  LACTIC ACID, PLASMA  CBC WITH DIFFERENTIAL/PLATELET  COMPREHENSIVE METABOLIC PANEL  C-REACTIVE PROTEIN  D-DIMER, QUANTITATIVE (NOT AT [facility])  MAGNESIUM  CBG MONITORING, ED  TROPONIN I (HIGH SENSITIVITY)  TROPONIN I (HIGH SENSITIVITY)    EKG EKG Interpretation  Date/Time:  Friday May 29 2020 17:41:08 EST Ventricular Rate:  86 PR Interval:    QRS Duration: 100 QT Interval:  390 QTC Calculation: 467 R Axis:   43 Text Interpretation: Atrial fibrillation Low voltage, extremity leads Probable anteroseptal infarct, old Artifact in lead(s) I III aVL V1 V2 V3 Interpretation limited secondary to artifact Confirmed by Sherwood Gambler [phone]) on 05/29/2020 5:43:51 PM   Radiology CT Head Wo Contrast  Result Date: 05/29/2020 CLINICAL DATA:  Mental status change.  Short of breath EXAM: CT HEAD WITHOUT CONTRAST TECHNIQUE: Contiguous axial images were obtained from the base of the skull through the vertex without intravenous contrast. COMPARISON:  None. FINDINGS: Brain: No evidence of acute infarction, hemorrhage, hydrocephalus, extra-axial collection or mass lesion/mass effect. Ventricular and sulcal enlargement reflecting moderate diffuse atrophy. Small area of old infarction involving the lateral anterior right frontal lobe. Patchy areas of white matter hypoattenuation noted bilaterally consistent with mild to moderate chronic microvascular ischemic change. Prominent cisterna  magna. Vascular: No hyperdense vessel or unexpected calcification. Skull: Normal. Negative for fracture or focal lesion. Sinuses/Orbits: Globes and orbits are unremarkable. Sinuses are clear. Other: None. IMPRESSION: 1. No acute intracranial abnormalities. 2. Moderate atrophy and mild to moderate chronic microvascular ischemic change. Small right anterolateral frontal lobe infarct. Electronically Signed   By: Lajean Manes M.D.   On: 05/29/2020 18:38   DG Chest Port 1 View  Result Date: 05/29/2020 CLINICAL DATA:  Pt was altered with right arm fracture on last admission 05/10/20. He has been AOx4 since returning to [facility] after 05/10/20 admission. Starting 2/9 pt has become increasingly confused. Today, staff report hypotension 70/40. On scene, EMS reported BP 68/40 and 80% O2 on room air. EXAM: PORTABLE CHEST 1 VIEW COMPARISON:  05/10/2020 FINDINGS: There are bilateral hazy airspace lung opacities, most evident in the right mid lung and throughout most of the left lung. No convincing pleural effusion.  No pneumothorax. Cardiac silhouette is normal in size. ORIF of the proximal right humerus fracture has been performed since the prior study. Fracture appears well aligned and orthopedic hardware well-seated. IMPRESSION: 1. New bilateral airspace lung opacities consistent with multifocal pneumonia. Pattern is compatible with COVID-19 infection. Electronically Signed  By: Lajean Manes M.D.   On: 05/29/2020 18:42    Procedures .Critical Care Performed by: Sherwood Gambler, MD Authorized by: Sherwood Gambler, MD   Critical care provider statement:    Critical care time (minutes):  35   Critical care time was exclusive of:  Separately billable procedures and treating other patients   Critical care was necessary to treat or prevent imminent or life-threatening deterioration of the following conditions:  Respiratory failure and sepsis   Critical care was time spent personally by me on the following  activities:  Discussions with consultants, evaluation of patient's response to treatment, examination of patient, ordering and performing treatments and interventions, ordering and review of laboratory studies, ordering and review of radiographic studies, pulse oximetry, re-evaluation of patient's condition, obtaining history from patient or surrogate and review of old charts     Medications Ordered in ED Medications  vancomycin (VANCOREADY) IVPB 1750 mg/350 mL (1,750 mg Intravenous New Bag/Given 05/29/20 2000)  phytonadione (VITAMIN K) 1 mg in dextrose 5 % 50 mL IVPB (1 mg Intravenous New Bag/Given 05/29/20 2005)  allopurinol (ZYLOPRIM) tablet 100 mg (has no administration in time range)  diltiazem (CARDIZEM CD) 24 hr capsule 120 mg (has no administration in time range)  metoprolol tartrate (LOPRESSOR) tablet 50 mg (has no administration in time range)  finasteride (PROSCAR) tablet 5 mg (has no administration in time range)  Melatonin CAPS 3 mg (has no administration in time range)  albuterol (VENTOLIN HFA) 108 (90 Base) MCG/ACT inhaler 2 puff (has no administration in time range)  guaiFENesin-dextromethorphan (ROBITUSSIN DM) 100-10 MG/5ML syrup 10 mL (has no administration in time range)  ascorbic acid (VITAMIN C) tablet 500 mg (has no administration in time range)  zinc sulfate capsule 220 mg (has no administration in time range)  acetaminophen (TYLENOL) tablet 650 mg (has no administration in time range)  polyethylene glycol (MIRALAX / GLYCOLAX) packet 17 g (has no administration in time range)  ondansetron (ZOFRAN) tablet 4 mg (has no administration in time range)    Or  ondansetron (ZOFRAN) injection 4 mg (has no administration in time range)  remdesivir 200 mg in sodium chloride 0.9% 250 mL IVPB (has no administration in time range)    Followed by  remdesivir 100 mg in sodium chloride 0.9 % 100 mL IVPB (has no administration in time range)  methylPREDNISolone sodium succinate  (SOLU-MEDROL) 125 mg/2 mL injection 47.5 mg (has no administration in time range)    Followed by  predniSONE (DELTASONE) tablet 50 mg (has no administration in time range)  lactated ringers infusion (has no administration in time range)  baricitinib (OLUMIANT) tablet 1 mg (has no administration in time range)  sodium chloride 0.9 % bolus 1,000 mL (0 mLs Intravenous Stopped 05/29/20 1921)  ceFEPIme (MAXIPIME) 2 g in sodium chloride 0.9 % 100 mL IVPB (0 g Intravenous Stopped 05/29/20 1959)    ED Course  I have reviewed the triage vital signs and the nursing notes.  Pertinent labs & imaging results that were available during my care of the patient were reviewed by me and considered in my medical decision making (see chart for details).    MDM Rules/Calculators/A&P                          Patient is currently mildly altered. He is definitely hypoxic, originally requiring 4L O2 and eventually desaturating more and requiring high flow [place]. Chest xray with impressive bilateral pneumonia. Given his original  positive Covid test was 15 days ago, he was given broad HCAP antibiotics, but this could be covid. Urine concerning for UTI. CT head personally reviewed, is benign. supra therapeutic INR is probably from recent poor PO intake while on warfarin. Mild AKI, given fluids. No hypotension in ED, and so while he has elevated lactate, he doesn't meet septic shock criteria. He is in no respiratory distress. Will admit.  VEDANSH KERSTETTER was evaluated in Emergency Department on 05/29/2020 for the symptoms described in the history of present illness. He was evaluated in the context of the global COVID-19 pandemic, which necessitated consideration that the patient might be at risk for infection with the SARS-CoV-2 virus that causes COVID-19. Institutional protocols and algorithms that pertain to the evaluation of patients at risk for COVID-19 are in a state of rapid change based on information released by regulatory  bodies including the CDC and federal and state organizations. These policies and algorithms were followed during the patient's care in the ED.  Final Clinical Impression(s) / ED Diagnoses Final diagnoses:  Acute respiratory failure with hypoxia (HCC)  Pneumonia of both lungs due to infectious organism, unspecified part of lung  Acute kidney injury superimposed on chronic kidney disease ([place])  Supratherapeutic INR    Rx / DC Orders ED Discharge Orders    None       Sherwood Gambler, MD 05/29/20 2029    Sherwood Gambler, MD 05/29/20 2042

## 2020-05-29 NOTE — ED Notes (Signed)
Date and time results received: 05/29/20 6:36 PM  Test: INR Critical Value: 8.8  Name of Provider Notified: Regenia Skeeter MD  Orders Received? Or Actions Taken?: None at this time

## 2020-05-29 NOTE — ED Triage Notes (Addendum)
[facility] transported pt from [facility] and reports the following:  Pt was altered with right arm fracture on last admission 05/10/20. He has been AOx4 since returning to [facility] after 05/10/20 admission. Starting 2/9 pt has  become increasingly confused. Today, staff report hypotension 70/40. On scene, EMS reported BP 68/40 and 80% O2 on room air. O2 increased to 96% on 4 L [place]. BP increased to 90/58 with 500 mL NS. CO2 20. Family on the way to hospital. They report possible dementia.

## 2020-05-29 NOTE — ED Notes (Signed)
Date and time results received: 05/29/20 6:51 PM (use smartphrase ".now" to insert current time)  Test: Lactic Acid Critical Value: 2.5  Name of Provider Notified: Regenia Skeeter

## 2020-05-29 NOTE — H&P (Addendum)
History and Physical    [name] BZJ:[id number] DOB: 09/18/1940 DOA: 05/29/2020  PCP: Janith Lima, MD  Patient coming from: [facility] via EMS   Chief Complaint:  Chief Complaint  Patient presents with  . Altered Mental Status     HPI:    80 year old male with past medical history of chronic kidney disease stage IIIb, left ventricular hypertrophy, chronic atrial fibrillation on Coumadin, hypertension, gout, benign prostatic hyperplasia, and thoracic aortic aneurysm who presents to [facility] hospital emergency department [name] due to progressively worsening confusion hypotension and hypoxia.  Of note, patient was recently hospitalized at [name] from 1/23 until 1/28. Patient suffered a fall at home resulting in a fracture of the right humerus status post ORIF on 1/25 by Dr. Percell Miller. Arrangements were then made for the patient to be discharged to a skilled nursing facility but upon screening Covid testing on 1/27 the patient was incidentally found to be positive.  Despite this, patient was still discharged to [name] on 1/28.  Patient is notably confused during the interview today and is unable to provide a history. Majority of the history has been obtained from discussion with the emergency department staff, discussion with the son via phone conversation and review of triage notes.  For approximately the past 3 days patient has become increasingly confused at his skilled nursing facility. Patient is also been exhibiting poor oral intake and evidence of shortness of breath.  As the patient's symptoms worsened, patient began to exhibit hypoxia and evidence of hypotension. SNF staff report patient's blood pressure on 2/11 as low as 70/40.  EMS was then contacted.  On EMS arrival, they report blood pressure of 68/40 and oxygen saturation of 80% on room air.  Patient placed on 4 L of oxygen via nasal  cannula and provided with 500 cc of normal saline.  Patient was promptly brought to [facility] emergency department for evaluation.  Upon evaluation in the emergency department patient was found to be hypoxic and requiring increasing levels of oxygen.  Formed on 4 L of oxygen via nasal cannula revealed a pH of 7.46 with PCO2 of 26.7 and PaO2 of 56.6.  Patient was eventually transitioned over to high flow salter oxygen delivery.  Chest x-ray revealed bilateral patchy infiltrates consistent with Covid pneumonia.  Procalcitonin was found to be low at 0.40.  Patient was additionally found to have a mild lactic acidosis of 2.5 as well as substantial Coumadin associated coagulopathy with INR of 8.8 in the absence of bleeding.  Patient was provided with 1 mg of IV vitamin K.  Patient was provided with initial doses of intravenous cefepime and vancomycin.  The hospitalist group was then called to assist the patient remains in the hospital.  Review of Systems:   Review of Systems  Unable to perform ROS: Mental status change    Past Medical History:  Diagnosis Date  . Cancer ([place])    skin  . Chronic renal impairment   . Diverticulosis   . DJD (degenerative joint disease)   . Elevated PSA    Dr Junious Silk  . H/O hiatal hernia   . Hypertension   . 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 Permanent atrial fibrillation ([name])   . PONV (postoperative nausea and vomiting)    no  problems recently    Past Surgical History:  Procedure Laterality Date  .  COLONOSCOPY  2003  . HERNIA REPAIR    . HIATAL HERNIA REPAIR     Dr Kaylyn Lim , laparoscopic  . INGUINAL HERNIA REPAIR Left 03/05/2013   Procedure: HERNIA REPAIR INGUINAL ADULT;  Surgeon: Nathan Faster. Cornett, MD;  Location: [facility];  Service: General;  Laterality: Left;  . INSERTION OF MESH Left 03/05/2013   Procedure: INSERTION OF MESH;  Surgeon: Nathan Faster. Cornett, MD;  Location: [place];   Service: General;  Laterality: Left;  . ORIF HUMERUS FRACTURE Right 05/12/2020   Procedure: OPEN REDUCTION INTERNAL FIXATION (ORIF) DISTAL HUMERUS FRACTURE;  Surgeon: Nathan Butters, MD;  Location: [facility];  Service: Orthopedics;  Laterality: Right;  . TOTAL HIP ARTHROPLASTY Right 07  . TOTAL KNEE ARTHROPLASTY Right 07     reports that he has never smoked. He has never used smokeless tobacco. He reports that he does not drink alcohol and does not use drugs.  Allergies  Allergen Reactions  . Tramadol Nausea And Vomiting    Family History  Problem Relation Age of Onset  . Hypertension Mother   . Skin cancer Mother   . Brain cancer Sister   . Hypertension Brother   . Diabetes Brother   . Stroke Neg Hx   . Heart disease Neg Hx      Prior to Admission medications   Medication Sig Start Date End Date Taking? Authorizing Provider  acetaminophen (TYLENOL) 325 MG tablet Take 975 mg by mouth daily.    [provider]  acetaminophen (TYLENOL) 650 MG CR tablet Take 650 mg by mouth every 8 (eight) hours as needed for pain.    [provider]  allopurinol (ZYLOPRIM) 100 MG tablet TAKE 1 TABLET(100 MG) BY MOUTH DAILY 12/23/19   Janith Lima, MD  allopurinol (ZYLOPRIM) 100 MG tablet Take 100 mg by mouth daily. 03/24/20   [provider]  alum & mag hydroxide-simeth (MAALOX/MYLANTA) 200-200-20 MG/5ML suspension Take 15 mLs by mouth every 6 (six) hours as needed for indigestion or heartburn. 05/14/20   [name], MD  bisacodyl (DULCOLAX) 10 MG suppository Place 1 suppository (10 mg total) rectally daily as needed for moderate constipation. 05/14/20   [name], MD  diltiazem (CARDIZEM CD) 120 MG 24 hr capsule TAKE 1 CAPSULE BY MOUTH EVERY DAY 02/06/20   [name], [name], MD  diltiazem (CARDIZEM CD) 120 MG 24 hr capsule Take 120 mg by mouth daily. 05/06/20   [provider]  finasteride (PROSCAR) 5 MG tablet Take 5 mg by mouth daily. Rx'ed by [name]     [provider]  finasteride (PROSCAR) 5 MG tablet Take 5 mg by mouth daily. 03/04/20   [provider]  furosemide (LASIX) 40 MG tablet TAKE 1 TABLET(40 MG) BY MOUTH TWICE DAILY 02/01/20   Janith Lima, MD  furosemide (LASIX) 40 MG tablet Take 1 tablet (40 mg total) by mouth daily. 05/15/20   [name], MD  levocetirizine (XYZAL) 5 MG tablet TAKE 1 TABLET(5 MG) BY MOUTH EVERY EVENING 12/19/19   Janith Lima, MD  levocetirizine (XYZAL) 5 MG tablet Take 5 mg by mouth at bedtime as needed for allergies. 03/16/20   [provider]  lisinopril (ZESTRIL) 40 MG tablet Take 40 mg by mouth daily. 12/24/19   [provider]  Melatonin 3 MG CAPS Take 1 capsule (3 mg total) by mouth at bedtime as needed. 05/14/20   [name], MD  metoprolol tartrate (LOPRESSOR) 50 MG tablet Take 1 tablet (50  mg total) by mouth 2 (two) times daily. 12/16/19   [name], [name], MD  metoprolol tartrate (LOPRESSOR) 50 MG tablet Take 50 mg by mouth 2 (two) times daily. 03/13/20   [provider]  oxyCODONE-acetaminophen (PERCOCET) 5-325 MG tablet Take 1 tablet by mouth every 6 (six) hours as needed for up to 20 days for severe pain. 05/14/20 06/03/20  [name], MD  polyethylene glycol (MIRALAX / GLYCOLAX) 17 g packet Take 17 g by mouth daily as needed for mild constipation. 05/14/20   Dahal, Marlowe Aschoff, MD  senna-docusate (SENOKOT-S) 8.6-50 MG tablet Take 1 tablet by mouth at bedtime as needed for mild constipation. 05/14/20   [name], MD  warfarin (COUMADIN) 5 MG tablet TAKE 1/2 TABLET BY MOUTH EVERY DAY EXCEPT TAKE 1 TABLET ON MONDAY, [facility] AND FRIDAY OR AS DIRECTED BY [place] 03/02/20   Janith Lima, MD  warfarin (COUMADIN) 5 MG tablet Take 5 mg by mouth daily. 04/07/20   [provider]    Physical Exam: Vitals:   05/29/20 1715 05/29/20 1802 05/29/20 1804 05/29/20 1830  BP: 121/83  103/79 108/66  Pulse: 65  (!) 56 79  Resp: (!) 29  19 (!)  26  Temp: 99.4 F (37.4 C)     TempSrc: Rectal     SpO2: 98%  93% 95%  Weight:  94.8 kg    Height:  5\' 9"  (1.753 m)       Constitutional: Patient is lethargic but arousable, oriented x1.  Patient is in mild respiratory distress. Skin: no rashes, no lesions, notable poor skin turgor.  Well-healed incisions of the right shoulder. Eyes: Pupils are equally reactive to light.  No evidence of scleral icterus or conjunctival pallor.  ENMT: Dry mucous membranes noted.  Posterior pharynx clear of any exudate or lesions.   Neck: normal, supple, no masses, no thyromegaly.  No evidence of jugular venous distension.   Respiratory: Somewhat coarse breath sounds bilaterally with bibasilar mid field rales.  No evidence of wheezing.  Increased respiratory effort without evidence of accessory muscle use.  Cardiovascular: Irregularly irregular rate and rhythm, no murmurs / rubs / gallops. No extremity edema. 2+ pedal pulses. No carotid bruits.  Chest:   Nontender without crepitus or deformity.   Back:   Nontender without crepitus or deformity. Abdomen: Abdomen is soft and nontender.  No evidence of intra-abdominal masses.  Positive bowel sounds noted in all quadrants.   Musculoskeletal: Notable pain in the right shoulder with both passive and active range of motion.  No evidence of crepitus.  No contractures. Normal muscle tone.  Neurologic: Patient exhibiting substantial lethargy.  Patient is arousable however and oriented x1.  CN 2-12 grossly intact. Sensation intact.  Patient moving all 4 extremities spontaneously.  Patient is following all commands.  Patient is responsive to verbal stimuli.   Psychiatric: Unable to fully assess due to confusion.  Is slightly agitated.  Patient currently does not seem to possess insight as to his current situation.      Labs on Admission: I have personally reviewed following labs and imaging studies -   CBC: Recent Labs  Lab 05/29/20 1734  WBC 8.7  NEUTROABS 7.4   HGB 10.0*  HCT 34.2*  MCV 99.1  PLT 034   Basic Metabolic Panel: Recent Labs  Lab 05/29/20 1734  NA 153*  K 4.0  CL 118*  CO2 21*  GLUCOSE 115*  BUN 72*  CREATININE 2.29*  CALCIUM 8.1*   GFR: Estimated Creatinine  Clearance: 28.2 mL/min (A) (by C-G formula based on SCr of 2.29 mg/dL (H)). Liver Function Tests: Recent Labs  Lab 05/29/20 1734  AST 23  ALT 14  ALKPHOS 91  BILITOT 1.3*  PROT 6.7  ALBUMIN 2.3*   No results for input(s): LIPASE, AMYLASE in the last 168 hours. No results for input(s): AMMONIA in the last 168 hours. Coagulation Profile: Recent Labs  Lab 05/29/20 1734  INR 8.8*   Cardiac Enzymes: No results for input(s): CKTOTAL, CKMB, CKMBINDEX, TROPONINI in the last 168 hours. BNP (last 3 results) Recent Labs    06/10/19 1055  PROBNP 535.0*   HbA1C: No results for input(s): HGBA1C in the last 72 hours. CBG: Recent Labs  Lab 05/29/20 1746  GLUCAP 95   Lipid Profile: No results for input(s): CHOL, HDL, LDLCALC, TRIG, CHOLHDL, LDLDIRECT in the last 72 hours. Thyroid Function Tests: No results for input(s): TSH, T4TOTAL, FREET4, T3FREE, THYROIDAB in the last 72 hours. Anemia Panel: No results for input(s): VITAMINB12, FOLATE, FERRITIN, TIBC, IRON, RETICCTPCT in the last 72 hours. Urine analysis:    Component Value Date/Time   COLORURINE YELLOW 05/29/2020 1734   APPEARANCEUR HAZY (A) 05/29/2020 1734   LABSPEC 1.015 05/29/2020 1734   PHURINE 7.0 05/29/2020 1734   GLUCOSEU NEGATIVE 05/29/2020 1734   HGBUR SMALL (A) 05/29/2020 1734   BILIRUBINUR NEGATIVE 05/29/2020 1734   KETONESUR NEGATIVE 05/29/2020 1734   PROTEINUR 100 (A) 05/29/2020 1734   UROBILINOGEN 1.0 01/20/2015 1748   NITRITE NEGATIVE 05/29/2020 1734   LEUKOCYTESUR LARGE (A) 05/29/2020 1734    Radiological Exams on Admission - Personally Reviewed: CT Head Wo Contrast  Result Date: 05/29/2020 CLINICAL DATA:  Mental status change.  Short of breath EXAM: CT HEAD WITHOUT  CONTRAST TECHNIQUE: Contiguous axial images were obtained from the base of the skull through the vertex without intravenous contrast. COMPARISON:  None. FINDINGS: Brain: No evidence of acute infarction, hemorrhage, hydrocephalus, extra-axial collection or mass lesion/mass effect. Ventricular and sulcal enlargement reflecting moderate diffuse atrophy. Small area of old infarction involving the lateral anterior right frontal lobe. Patchy areas of white matter hypoattenuation noted bilaterally consistent with mild to moderate chronic microvascular ischemic change. Prominent cisterna magna. Vascular: No hyperdense vessel or unexpected calcification. Skull: Normal. Negative for fracture or focal lesion. Sinuses/Orbits: Globes and orbits are unremarkable. Sinuses are clear. Other: None. IMPRESSION: 1. No acute intracranial abnormalities. 2. Moderate atrophy and mild to moderate chronic microvascular ischemic change. Small right anterolateral frontal lobe infarct. Electronically Signed   By: Lajean Manes M.D.   On: 05/29/2020 18:38   DG Chest Port 1 View  Result Date: 05/29/2020 CLINICAL DATA:  Pt was altered with right arm fracture on last admission 05/10/20. He has been AOx4 since returning to [facility] after 05/10/20 admission. Starting 2/9 pt has become increasingly confused. Today, staff report hypotension 70/40. On scene, EMS reported BP 68/40 and 80% O2 on room air. EXAM: PORTABLE CHEST 1 VIEW COMPARISON:  05/10/2020 FINDINGS: There are bilateral hazy airspace lung opacities, most evident in the right mid lung and throughout most of the left lung. No convincing pleural effusion.  No pneumothorax. Cardiac silhouette is normal in size. ORIF of the proximal right humerus fracture has been performed since the prior study. Fracture appears well aligned and orthopedic hardware well-seated. IMPRESSION: 1. New bilateral airspace lung opacities consistent with multifocal pneumonia. Pattern is compatible with  COVID-19 infection. Electronically Signed   By: Lajean Manes M.D.   On: 05/29/2020 18:42  EKG: Personally reviewed.  Rhythm is atrial fibrillation with heart rate of 86 bpm.  No dynamic ST segment changes appreciated.  Assessment/Plan Principal Problem:   Acute hypoxemic respiratory failure due to COVID-19 [facility])   Patient initially found to be positive for Covid on 1/27 during SNF entrance screening.  Patient was asymptomatic at the time.  In the past several days patient has become progressively more confused short of breath and weak, all secondary to progression of Covid infection.  Patient now exhibiting substantial acute hypoxic respiratory failure now requiring high flow oxygen delivery with presentation complicated by lactic acidosis and acute metabolic encephalopathy.  While patient is presenting approximately 2 weeks after initial diagnosis value of intravenous remdesivir is uncertain however considering severity of illness we will go ahead and initiate this regimen.  Patiently, considering need for high flow oxygen delivery will initiate baricitinib, renally dosed.  We have also initiated intravenous Solu-Medrol.  Providing patient with as needed bronchodilator therapy  As needed antitussives  Providing patient with zinc and vitamin C supplementation  Placing patient in progressive unit  Airborne and contact isolation, due to ongoing respiratory illness patient is using the 21-day isolation protocol (dx on 1/27)  Active Problems:   Hypernatremia   Patient is exhibiting a notable hyponatremia with sodium of 153  This is likely secondary to significant volume depletion that has developed over the past several days due to ongoing Covid illness  Patient is already been provided a 500 cc lactated Ringer bolus.  We will repeat another bolus of lactated Ringer solution followed by placement of the patient on half-normal saline  Monitoring sodium levels with serial  chemistries.    Urinary tract infection   Patient exhibiting markedly abnormal urinalysis   While it is unclear as to whether or not this urinalysis represents a true urinary tract infection or is simply asymptomatic bacteriuria  -considering severity of illness with ongoing lactic acidosis and encephalopathy will go ahead and treat with several days of intravenous ceftriaxone.      Urine culture pending   Blood cultures pending   Lactic acidosis   Notable lactic acidosis on arrival  Likely secondary to volume depletion and underlying infection  Hydrating patient with intravenous fluids and treating underlying illness  Forming serial lactic acid levels to ensure downtrending and resolution.    Acute metabolic encephalopathy   Patient presenting with several days of lethargy and confusion with poor oral intake  Patient does not have a confirmed diagnosis of dementia as far as I can tell  Treating underlying illness and hydrating patient with intravenous fluids  Monitoring for improvement   If encephalopathy fails to resolve will expand work-up  Considering markedly elevated INR on arrival of 8.8, CT imaging of the head has been performed without contrast revealing no evidence of acute intracranial disease.    Essential hypertension  Continuing home regimen of metoprolol and diltiazem primarily due to concurrent atrial fibrillation  Monitoring closely for recurrence of hypotension that patient experienced back at his skilled nursing facility.    Parameters have been placed for administration of these antihypertensive/rate controlling agents.    Warfarin-induced coagulopathy ([place])   Patient presenting with markedly elevated INR of 8.8  Holding warfarin  ER provider has already administered 1 mg of intravenous vitamin K  No clinical evidence of bleeding  CT head reveals no evidence of intracranial hemorrhage  Monitoring with serial PT/INR levels  Chronic  kidney disease stage IIIb   While BUN and creatinine have risen  somewhat compared to discharge, it does not quite meet the threshold to be acute kidney injury  Hydrating patient with intravenous isotonic fluids as mentioned above  Strict input and output monitoring  Minimizing nephrotoxic agents  Monitoring renal function and electrolytes with serial chemistries    Atrial fibrillation, chronic ([facility])   Holding Coumadin due to coagulopathy  Continuing home regimen of diltiazem and metoprolol while monitoring for episodic hypotension  Monitoring on telemetry    Gout  Continuing home regimen of allopurinol    Code Status:  Full code Family Communication: Discussed patient's condition with this at length via phone conversation.  Status is: Inpatient  Remains inpatient appropriate because:Ongoing diagnostic testing needed not appropriate for outpatient work up, IV treatments appropriate due to intensity of illness or inability to take PO and Inpatient level of care appropriate due to severity of illness   Dispo: The patient is from: SNF              Anticipated d/c is to: SNF              Anticipated d/c date is: > 3 days              Patient currently is not medically stable to d/c.   Difficult to place patient No        Vernelle Emerald MD [facility] Pager [phone]  If 7PM-7AM, please contact night-coverage [email or web address] Use universal [place] password for that web site. If you do not have the password, please call the hospital operator.  05/29/2020, 9:08 PM

## 2020-05-29 NOTE — Progress Notes (Signed)
Code Sepsis initiated @ 0932 PM, Elink following

## 2020-05-29 NOTE — Progress Notes (Signed)
A consult was received from an ED physician for vancomycin and cefepime per pharmacy dosing.  The patient's profile has been reviewed for ht/wt/allergies/indication/available labs.    Wt = 94.8 kg   A one time order has been placed for vancomycin 1750 mg IV once + cefepime 2 g IV once.    Further antibiotics/pharmacy consults should be ordered by admitting physician if indicated.                       Thank you, Lenis Noon, PharmD 05/29/2020  6:36 PM

## 2020-05-30 ENCOUNTER — Other Ambulatory Visit: Payer: Self-pay

## 2020-05-30 DIAGNOSIS — L8990 Pressure ulcer of unspecified site, unspecified stage: Secondary | ICD-10-CM | POA: Insufficient documentation

## 2020-05-30 DIAGNOSIS — J9601 Acute respiratory failure with hypoxia: Secondary | ICD-10-CM | POA: Diagnosis not present

## 2020-05-30 DIAGNOSIS — U071 COVID-19: Secondary | ICD-10-CM | POA: Diagnosis not present

## 2020-05-30 LAB — COMPREHENSIVE METABOLIC PANEL
ALT: 12 U/L (ref 0–44)
AST: 17 U/L (ref 15–41)
Albumin: 1.9 g/dL — ABNORMAL LOW (ref 3.5–5.0)
Alkaline Phosphatase: 80 U/L (ref 38–126)
Anion gap: 12 (ref 5–15)
BUN: 69 mg/dL — ABNORMAL HIGH (ref 8–23)
CO2: 18 mmol/L — ABNORMAL LOW (ref 22–32)
Calcium: 7.8 mg/dL — ABNORMAL LOW (ref 8.9–10.3)
Chloride: 124 mmol/L — ABNORMAL HIGH (ref 98–111)
Creatinine, Ser: 2.06 mg/dL — ABNORMAL HIGH (ref 0.61–1.24)
GFR, Estimated: 32 mL/min — ABNORMAL LOW (ref >60)
Glucose, Bld: 138 mg/dL — ABNORMAL HIGH (ref 70–99)
Potassium: 4.2 mmol/L (ref 3.5–5.1)
Sodium: 154 mmol/L — ABNORMAL HIGH (ref 135–145)
Total Bilirubin: 0.9 mg/dL (ref 0.3–1.2)
Total Protein: 5.7 g/dL — ABNORMAL LOW (ref 6.5–8.1)

## 2020-05-30 LAB — CBC WITH DIFFERENTIAL/PLATELET
Abs Immature Granulocytes: 0.04 10*3/uL (ref 0.00–0.07)
Basophils Absolute: 0 10*3/uL (ref 0.0–0.1)
Basophils Relative: 0 %
Eosinophils Absolute: 0 10*3/uL (ref 0.0–0.5)
Eosinophils Relative: 0 %
HCT: 30.4 % — ABNORMAL LOW (ref 39.0–52.0)
Hemoglobin: 8.9 g/dL — ABNORMAL LOW (ref 13.0–17.0)
Immature Granulocytes: 1 %
Lymphocytes Relative: 4 %
Lymphs Abs: 0.3 10*3/uL — ABNORMAL LOW (ref 0.7–4.0)
MCH: 29.4 pg (ref 26.0–34.0)
MCHC: 29.3 g/dL — ABNORMAL LOW (ref 30.0–36.0)
MCV: 100.3 fL — ABNORMAL HIGH (ref 80.0–100.0)
Monocytes Absolute: 0.1 10*3/uL (ref 0.1–1.0)
Monocytes Relative: 1 %
Neutro Abs: 5.6 10*3/uL (ref 1.7–7.7)
Neutrophils Relative %: 94 %
Platelets: 268 10*3/uL (ref 150–400)
RBC: 3.03 MIL/uL — ABNORMAL LOW (ref 4.22–5.81)
RDW: 15.5 % (ref 11.5–15.5)
WBC: 6 10*3/uL (ref 4.0–10.5)
nRBC: 0 % (ref 0.0–0.2)

## 2020-05-30 LAB — C-REACTIVE PROTEIN: CRP: 12.2 mg/dL — ABNORMAL HIGH (ref <1.0)

## 2020-05-30 LAB — PROTIME-INR
INR: 9.4 (ref 0.8–1.2)
Prothrombin Time: 73.9 seconds — ABNORMAL HIGH (ref 11.4–15.2)

## 2020-05-30 LAB — D-DIMER, QUANTITATIVE: D-Dimer, Quant: 1.93 ug/mL-FEU — ABNORMAL HIGH (ref 0.00–0.50)

## 2020-05-30 LAB — MAGNESIUM: Magnesium: 2 mg/dL (ref 1.7–2.4)

## 2020-05-30 MED ORDER — SODIUM CHLORIDE 0.45 % IV SOLN: INTRAVENOUS | Status: DC

## 2020-05-30 MED ORDER — DIPHENHYDRAMINE-ZINC ACETATE 2-0.1 % EX CREA
TOPICAL_CREAM | Freq: Three times a day (TID) | CUTANEOUS | Status: DC | PRN
  Filled 2020-05-30: qty 28

## 2020-05-30 MED ORDER — DIPHENHYDRAMINE HCL 25 MG PO CAPS
25.0000 mg | ORAL_CAPSULE | Freq: Three times a day (TID) | ORAL | Status: AC | PRN
Start: 2020-05-30 — End: 2020-05-30
  Administered 2020-05-30 (×2): 25 mg via ORAL
  Filled 2020-05-30 (×2): qty 1

## 2020-05-30 MED ORDER — METHYLPREDNISOLONE SODIUM SUCC 125 MG IJ SOLR
60.0000 mg | Freq: Two times a day (BID) | INTRAMUSCULAR | Status: DC
  Administered 2020-05-30 – 2020-06-02 (×6): 60 mg via INTRAVENOUS
  Filled 2020-05-30 (×7): qty 2

## 2020-05-30 MED ORDER — BARICITINIB 2 MG PO TABS
2.0000 mg | ORAL_TABLET | Freq: Every day | ORAL | Status: DC
  Administered 2020-05-30 – 2020-06-03 (×5): 2 mg via ORAL
  Filled 2020-05-30 (×6): qty 1

## 2020-05-30 NOTE — Progress Notes (Signed)
PROGRESS NOTE  [name] [id number]  DOB: Jul 18, 1940  PCP: Janith Lima, MD RCV:[id number]  DOA: 05/29/2020  LOS: 1 day   Chief Complaint  Patient presents with  . Altered Mental Status   Brief narrative: Nathan [name] is a 80 y.o. male with PMH significant for HTN, CKD 3B, chronic A. fib on Coumadin, left ventricle hypertrophy, thoracic aortic aneurysm, BPH, gout who lives at [facility] skilled nursing facility. Patient was brought to the ED on 2/11 for progressively worsening confusion, hypotension and hypoxia. Patient was recently hospitalized from 1/23-1/28 after a fall resulting in a fracture of the right humerus for which he underwent ORIF on 1/25 by Dr. Percell Miller. Of note, patient tested positive for Covid when checked prior to discharge to SNF. Per report, for last 2 to 3 days, patient has low oral intake, progressively worsening confusion and also shortness of breath. SNF staff noted his blood pressure to be low at 70/40 and hence EMS was called. EMS noted blood pressure low at 68/40, O2 sat was 80% on room air. He was given 500 cc of normal saline, placed on 4 L oxygen by nasal cannula and brought to the ED.  In the ED, patient was afebrile, tachypneic to 29. He initially was able to maintain saturation on 4 L oxygen but later required up to 12 L high flow by nasal cannula. Blood gas at that time showed a normal pH of 7.46, PCO2 low at 26.7 and a low PO2 at 56.6. Chest x-ray revealed bilateral patchy infiltrates consistent with Covid pneumonia.   Labs showed CRP elevated to 13.5, procalcitonin normal, WBC count normal, sodium level elevated 253, creatinine elevated 2.29, INR elevated to 8.8, with no evidence of bleeding. Patient received 1 mg of IV vitamin K, IV bolus or antibiotics. Admitted to hospitalist service  Subjective: Patient was seen and examined this morning.  Pleasant elderly Caucasian male.  Propped up in bed.  Not in distress.  On 10 L oxygen by nasal  cannula. Oriented to place and person. Chart reviewed Temperature 99, blood pressure normal Last set of labs from this morning shows sodium level elevated to 154, PCO2 low at 18, BUN/creatinine 69/2.06, albumin low at 1.9, CRP slightly low at 12.2  Assessment/Plan: COVID pneumonia Acute respiratory failure with hypoxia  -Presented with worsening shortness of breath, lethargy -COVID test: PCR positive on 1/27 -Chest imaging: Chest x-ray on admission showed new bilateral airspace lung opacities consistent with multifocal pneumonia  -Treatment: Currently on a 5-day course of IV remdesivir and Solu-Medrol IV 60 mg twice daily.  He was also started on baricitinib 1 mg daily on admission. -Protonix while on steroids. -Currently also on broad-spectrum antibiotics.  Continue to trend procalcitonin. -Oxygen - SpO2: 100 % O2 Flow Rate (L/min): 12 L/min -Supportive care: Vitamin C, Zinc, PRN inhalers, Tylenol, Antitussives (benzonatate/ Mucinex/Tussionex).   -Encouraged incentive spirometry, prone position, out of bed and early mobilization as much as possible -Continue airborne/contact isolation precautions for duration of 3 weeks from the day of diagnosis. -WBC and inflammatory markers trend as below. Recent Labs  Lab 05/29/20 1734 05/29/20 1929 05/29/20 1954 05/30/20 0508  WBC 8.7  --   --  6.0  LATICACIDVEN 1.6  2.5*  --  1.7  --   PROCALCITON  --  0.40  --   --   DDIMER  --  2.14*  --  1.93*  CRP  --  13.5*  --  12.2*  ALT 14  --   --  12   The treatment plan and use of medications and known side effects were discussed with patient/family. Some of the medications used are based on case reports/anecdotal data.  All other medications being used in the management of COVID-19 based on limited study data.  Complete risks and long-term side effects are unknown, however in the best clinical judgment they seem to be of some benefit.  Patient wanted to proceed with treatment options  provided.  Abnormal urinalysis -Urinalysis with hazy urine, leukocytes and bacteria probably because of dehydration.  AKI on CKD 3B -Creatinine was 1.89 on last discharge, presented with a creatinine elevated 2.29 number.  Expect improvement with hydration. Recent Labs    06/10/19 1055 05/10/20 0350 05/11/20 0521 05/12/20 0518 05/13/20 0906 05/14/20 0812 05/15/20 0318 05/29/20 1734 05/30/20 0508  BUN 34* 45* 42* 49* 42* 49* 50* 72* 69*  CREATININE 1.58* 2.01* 1.86* 2.03* 1.92* 1.96* 1.89* 2.29* 2.06*   Acute hypernatremia -Due to poor oral intake.   -Since patient remains volume depleted as well we will continue half normal saline at this time.  We will recheck sodium level tomorrow.  May need to switch to dextrose once volume is resuscitated Recent Labs  Lab 05/29/20 1734 05/30/20 0508  NA 153* 154*   Lactic acidosis Non-anion gap metabolic acidosis -Probably secondary to decreased tissue perfusion from dehydration and also anaerobic respiration due to COVID pneumonia.  Acute metabolic encephalopathy Mild chronic cognitive dysfunction -Acute change in mental status because of dehydration, infection. -Continue to monitor.  Essential hypertension -Continue to monitor on Cardizem, metoprolol.   -On last hospitalization, lisinopril was stopped and Lasix dose was reduced.    Elevated INR -INR elevated to 8.8, further elevated to 9.4 this morning.  1 dose of vitamin K IV was given in the ED.  Patient not actively wheezing at this time.  Will let the INR drift down on its own Recent Labs  Lab 05/29/20 1734 05/30/20 0508  INR 8.8* 9.4*   Afib -On metoprolol and Cardizem.  Since blood pressure is improved, okay to continue both. -On long-term anticoagulation with Coumadin.   Supratherapeutic at this time as mentioned above. -During last hospitalization, I had a discussion with patient's son about continuation of Coumadin.  At that time, patient's son wanted to continue  Coumadin and revisit the decision later.  Acute on chronic anemia -Baseline hemoglobin above 10.  Hemoglobin today at 8.9. -Continue to monitor.  Obtain anemia panel Recent Labs    05/13/20 0906 05/14/20 0812 05/15/20 0318 05/29/20 1734 05/30/20 0508  HGB 10.7* 10.9* 10.0* 10.0* 8.9*  MCV 100.0 99.4 97.0 99.1 100.3*   Recent comminuted fracture of right humerus -After mechanical fall. -1/25, underwent ORIF of right humerus by Dr. Percell Miller. -Continue pain control with oral oxycodone. -Orthopedics recommended NWBRUEto the shoulder, minimal WB to the right hand through the elbow.  Impaired mobility -Prior to last admission, patient was using a walker at home. -Discharged to SNF on last admission  Mild protein calorie malnutrition Hypoalbuminemia -Albumin level low at 1.9.  Secondary to poor appetite, poor p.o. intake -Nutrition consult  Gout -Continuing home regimen of allopurinol  Mobility: PT eval ordered Code Status:   Code Status: Full Code  Nutritional status: Body mass index is 27.25 kg/m.     Diet Order            Diet Heart Room service appropriate? Yes; Fluid consistency: Thin  Diet effective now  DVT prophylaxis: SCDs Start: 05/29/20 2011   Antimicrobials:  Currently on IV Rocephin Fluid: Half-normal saline at 75 mill per hour Consultants: None Family Communication:  None at bedside  Status is: Inpatient  Remains inpatient appropriate because: On IV treatment for Covid pneumonia, dehydration  Dispo: The patient is from: SNF              Anticipated d/c is to: SNF              Anticipated d/c date is: 3 days              Patient currently is not medically stable to d/c.   Difficult to place patient No       Infusions:  . sodium chloride 75 mL/hr at 05/30/20 1244  . cefTRIAXone (ROCEPHIN)  IV 1 g (05/30/20 0350)  . remdesivir 100 mg in NS 100 mL 100 mg (05/30/20 1046)    Scheduled Meds: . allopurinol  100 mg Oral  Daily  . vitamin C  500 mg Oral Daily  . baricitinib  2 mg Oral Daily  . diltiazem  120 mg Oral Daily  . finasteride  5 mg Oral Daily  . methylPREDNISolone (SOLU-MEDROL) injection  60 mg Intravenous Q12H  . metoprolol tartrate  50 mg Oral BID  . zinc sulfate  220 mg Oral Daily    Antimicrobials: Anti-infectives (From admission, onward)   Start     Dose/Rate Route Frequency Ordered Stop   05/30/20 1000  remdesivir 100 mg in sodium chloride 0.9 % 100 mL IVPB       "Followed by" Linked Group Details   100 mg 200 mL/hr over 30 Minutes Intravenous Daily 05/29/20 2010 06/03/20 0959   05/30/20 0200  cefTRIAXone (ROCEPHIN) 1 g in sodium chloride 0.9 % 100 mL IVPB        1 g 200 mL/hr over 30 Minutes Intravenous Every 24 hours 05/29/20 2108     05/29/20 2200  remdesivir 200 mg in sodium chloride 0.9% 250 mL IVPB       "Followed by" Linked Group Details   200 mg 580 mL/hr over 30 Minutes Intravenous Once 05/29/20 2010 05/29/20 2317   05/29/20 1845  vancomycin (VANCOREADY) IVPB 1750 mg/350 mL        1,750 mg 175 mL/hr over 120 Minutes Intravenous  Once 05/29/20 1835 05/29/20 2200   05/29/20 1830  vancomycin (VANCOCIN) IVPB 1000 mg/200 mL premix  Status:  Discontinued        1,000 mg 200 mL/hr over 60 Minutes Intravenous  Once 05/29/20 1826 05/29/20 1835   05/29/20 1830  ceFEPIme (MAXIPIME) 2 g in sodium chloride 0.9 % 100 mL IVPB        2 g 200 mL/hr over 30 Minutes Intravenous  Once 05/29/20 1826 05/29/20 1959      PRN meds: acetaminophen, albuterol, diphenhydrAMINE-zinc acetate, guaiFENesin-dextromethorphan, melatonin, ondansetron **OR** ondansetron (ZOFRAN) IV, polyethylene glycol   Objective: Vitals:   05/30/20 0224 05/30/20 0639  BP: (!) 158/94 (!) 127/95  Pulse: (!) 103 77  Resp: 20 20  Temp: 97.7 F (36.5 C) 99 F (37.2 C)  SpO2: (!) 86% 100%    Intake/Output Summary (Last 24 hours) at 05/30/2020 1446 Last data filed at 05/30/2020 0400 Gross per 24 hour  Intake 3221.67  ml  Output -  Net 3221.67 ml   Filed Weights   05/29/20 1802 05/30/20 0224  Weight: 94.8 kg 83.7 kg   Weight change:  Body mass index is 27.25 kg/m.  Physical Exam: General exam: Pleasant elderly Caucasian male.  Propped up in bed.  Not in physical distress Skin: No rashes, lesions or ulcers. HEENT: Atraumatic, normocephalic, no obvious bleeding Lungs: Diminished air entry in bases. CVS: Regular rate and rhythm, no murmur GI/Abd soft, nontender, nondistended, bowel sound present CNS: Alert, awake, oriented to place and person, Psychiatry: Depressed look Extremities: No pedal edema, no calf tenderness  Data Review: I have personally reviewed the laboratory data and studies available.  Recent Labs  Lab 05/29/20 1734 05/30/20 0508  WBC 8.7 6.0  NEUTROABS 7.4 5.6  HGB 10.0* 8.9*  HCT 34.2* 30.4*  MCV 99.1 100.3*  PLT 363 268   Recent Labs  Lab 05/29/20 1734 05/30/20 0508  NA 153* 154*  K 4.0 4.2  CL 118* 124*  CO2 21* 18*  GLUCOSE 115* 138*  BUN 72* 69*  CREATININE 2.29* 2.06*  CALCIUM 8.1* 7.8*  MG  --  2.0    F/u labs ordered Unresulted Labs (From admission, onward)          Start     Ordered   05/31/20 0500  Vitamin B12  (Anemia Panel (PNL))  Tomorrow morning,   R        05/30/20 0859   05/31/20 0500  Folate  (Anemia Panel (PNL))  Tomorrow morning,   R        05/30/20 0859   05/31/20 0500  Iron and TIBC  (Anemia Panel (PNL))  Tomorrow morning,   R        05/30/20 0859   05/31/20 0500  Ferritin  (Anemia Panel (PNL))  Tomorrow morning,   R        05/30/20 0859   05/31/20 0500  Reticulocytes  (Anemia Panel (PNL))  Tomorrow morning,   R        05/30/20 0859   05/31/20 0500  Procalcitonin  Daily,   R      05/30/20 1443   05/30/20 0500  CBC with Differential/Platelet  Daily,   R      05/29/20 2010   05/30/20 0500  Comprehensive metabolic panel  Daily,   R      05/29/20 2010   05/30/20 0500  C-reactive protein  Daily,   R      05/29/20 2010    05/30/20 0500  D-dimer, quantitative (not at [facility])  Daily,   R      05/29/20 2010   05/30/20 0500  Magnesium  Daily,   R      05/29/20 2010   05/29/20 1715  Urine culture  (Undifferentiated presentation (screening labs and basic nursing orders))  ONCE - STAT,   STAT        05/29/20 1714   05/29/20 1715  Blood Culture (routine x 2)  (Undifferentiated presentation (screening labs and basic nursing orders))  BLOOD CULTURE X 2,   STAT      05/29/20 1714         Signed, [name], MD [facility] 05/30/2020

## 2020-05-30 NOTE — Progress Notes (Signed)
Code Sepsis completion note:  COVID +, did not get fluid per weight. LA 1.7. Being admitted to Progressive unit.

## 2020-05-30 NOTE — Progress Notes (Signed)
Night provider notified INR 9.4. Note pt INR was 8.8 in ED and pt received vit K. Pt also received remdesivir

## 2020-05-30 NOTE — Evaluation (Signed)
Clinical/Bedside Swallow Evaluation Patient Details  Name: Nathan [name] MRN: [id number] Date of Birth: [birth year]  Today's Date: 05/30/2020 Time: SLP Start Time (ACUTE ONLY): 1200 SLP Stop Time (ACUTE ONLY): 1215 SLP Time Calculation (min) (ACUTE ONLY): 15 min  Past Medical History:  Past Medical History:  Diagnosis Date  . Cancer ([place])    skin  . Chronic renal impairment   . Diverticulosis   . DJD (degenerative joint disease)   . Elevated PSA    Dr Nathan [name]  . H/O hiatal hernia   . Hypertension   . 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 Permanent atrial fibrillation ([place])   . PONV (postoperative nausea and vomiting)    no  problems recently   Past Surgical History:  Past Surgical History:  Procedure Laterality Date  . COLONOSCOPY  2003  . HERNIA REPAIR    . HIATAL HERNIA REPAIR     Dr Nathan [name] , laparoscopic  . INGUINAL HERNIA REPAIR Left 03/05/2013   Procedure: HERNIA REPAIR INGUINAL ADULT;  Surgeon: Nathan Faster. Cornett, MD;  Location: [facility];  Service: General;  Laterality: Left;  . INSERTION OF MESH Left 03/05/2013   Procedure: INSERTION OF MESH;  Surgeon: Nathan Faster. Cornett, MD;  Location: [facility];  Service: General;  Laterality: Left;  . ORIF HUMERUS FRACTURE Right 05/12/2020   Procedure: OPEN REDUCTION INTERNAL FIXATION (ORIF) DISTAL HUMERUS FRACTURE;  Surgeon: Nathan Butters, MD;  Location: [facility];  Service: Orthopedics;  Laterality: Right;  . TOTAL HIP ARTHROPLASTY Right 07  . TOTAL KNEE ARTHROPLASTY Right 55   HPI:  80 year old male with past medical history of chronic kidney disease stage IIIb, left ventricular hypertrophy, chronic atrial fibrillation on Coumadin, hypertension, gout, benign prostatic hyperplasia, and thoracic aortic aneurysm who presents to [facility] long hospital emergency department from [place] skilled nursing facility due to progressively worsening confusion hypotension and hypoxia.  Of note, patient was recently hospitalized at [facility] from 1/23 until 1/28. Patient suffered a fall at home resulting in a fracture of the right humerus status post ORIF on 1/25 by Dr. Percell [name]. Arrangements were then made for the patient to be discharged to a skilled nursing facility but upon screening Covid testing on 1/27 the patient was incidentally found to be positive.  Despite this, patient was still discharged to [place] facility on 1/28.   Assessment / Plan / Recommendation Clinical Impression  Pt demonstrates no signs of aspiration or dysphagia. He is eager to eat and drink today. RN also confirms no concerns. Recommend pt continue current diet. Will sign off. SLP Visit Diagnosis: Dysphagia, unspecified (R13.10)    Aspiration Risk  Mild aspiration risk    Diet Recommendation Regular;Thin liquid   Liquid Administration via: Cup;Straw Medication Administration: Whole meds with liquid Supervision: Patient able to self feed Postural Changes: Seated upright at 90 degrees    Other  Recommendations Oral Care Recommendations: Oral care BID   Follow up Recommendations None      Frequency and Duration            Prognosis        Swallow Study   General HPI: 80 year old male with past medical history of chronic kidney disease stage IIIb, left ventricular hypertrophy, chronic atrial fibrillation on Coumadin, hypertension, gout, benign prostatic hyperplasia, and thoracic aortic aneurysm who presents to [facility] long hospital emergency department from [place] skilled nursing facility due to progressively worsening  confusion hypotension and hypoxia. Of note, patient was recently hospitalized at [facility] from 1/23 until 1/28. Patient suffered a fall at home resulting in a fracture of the right humerus status post ORIF on 1/25 by Dr. Percell [name]. Arrangements were then made for the patient to be discharged to a skilled nursing facility but upon  screening Covid testing on 1/27 the patient was incidentally found to be positive.  Despite this, patient was still discharged to [place] facility on 1/28. Type of Study: Bedside Swallow Evaluation Diet Prior to this Study: Regular;Thin liquids Temperature Spikes Noted: No Respiratory Status: Room air History of Recent Intubation: No Behavior/Cognition: Alert;Cooperative Oral Cavity Assessment: Within Functional Limits Oral Care Completed by SLP: No Oral Cavity - Dentition: Missing dentition Vision: Functional for self-feeding Self-Feeding Abilities: Able to feed self Patient Positioning: Upright in bed Baseline Vocal Quality: Normal Volitional Cough: Strong Volitional Swallow: Able to elicit    Oral/Motor/Sensory Function Overall Oral Motor/Sensory Function: Within functional limits   Ice Chips     Thin Liquid Thin Liquid: Within functional limits Presentation: Straw;Self Fed    Nectar Thick Nectar Thick Liquid: Not tested   Honey Thick Honey Thick Liquid: Not tested   Puree Puree: Within functional limits Presentation: Spoon;Self Fed   Solid     Solid: Within functional limits     Nathan Baltimore, MA CCC-SLP  Acute Rehabilitation Services Pager [phone] Office [phone]  Nathan [name] 05/30/2020,12:43 PM

## 2020-05-30 NOTE — ED Notes (Signed)
Report called to [facility]

## 2020-05-30 NOTE — Progress Notes (Signed)
Night provider notified of diffuse welt liek rash on trunk, groin and slight on legs and back.

## 2020-05-31 DIAGNOSIS — U071 COVID-19: Secondary | ICD-10-CM | POA: Diagnosis not present

## 2020-05-31 DIAGNOSIS — J9601 Acute respiratory failure with hypoxia: Secondary | ICD-10-CM | POA: Diagnosis not present

## 2020-05-31 LAB — IRON AND TIBC
Iron: 51 ug/dL (ref 45–182)
Saturation Ratios: 36 % (ref 17.9–39.5)
TIBC: 142 ug/dL — ABNORMAL LOW (ref 250–450)
UIBC: 91 ug/dL

## 2020-05-31 LAB — CBC WITH DIFFERENTIAL/PLATELET
Abs Immature Granulocytes: 0.06 10*3/uL (ref 0.00–0.07)
Basophils Absolute: 0 10*3/uL (ref 0.0–0.1)
Basophils Relative: 0 %
Eosinophils Absolute: 0 10*3/uL (ref 0.0–0.5)
Eosinophils Relative: 0 %
HCT: 32.5 % — ABNORMAL LOW (ref 39.0–52.0)
Hemoglobin: 9.6 g/dL — ABNORMAL LOW (ref 13.0–17.0)
Immature Granulocytes: 1 %
Lymphocytes Relative: 4 %
Lymphs Abs: 0.4 10*3/uL — ABNORMAL LOW (ref 0.7–4.0)
MCH: 29.4 pg (ref 26.0–34.0)
MCHC: 29.5 g/dL — ABNORMAL LOW (ref 30.0–36.0)
MCV: 99.4 fL (ref 80.0–100.0)
Monocytes Absolute: 0.1 10*3/uL (ref 0.1–1.0)
Monocytes Relative: 1 %
Neutro Abs: 7.4 10*3/uL (ref 1.7–7.7)
Neutrophils Relative %: 94 %
Platelets: 329 10*3/uL (ref 150–400)
RBC: 3.27 MIL/uL — ABNORMAL LOW (ref 4.22–5.81)
RDW: 15.2 % (ref 11.5–15.5)
WBC: 7.9 10*3/uL (ref 4.0–10.5)
nRBC: 0.5 % — ABNORMAL HIGH (ref 0.0–0.2)

## 2020-05-31 LAB — COMPREHENSIVE METABOLIC PANEL
ALT: 15 U/L (ref 0–44)
AST: 20 U/L (ref 15–41)
Albumin: 2.1 g/dL — ABNORMAL LOW (ref 3.5–5.0)
Alkaline Phosphatase: 94 U/L (ref 38–126)
Anion gap: 13 (ref 5–15)
BUN: 75 mg/dL — ABNORMAL HIGH (ref 8–23)
CO2: 17 mmol/L — ABNORMAL LOW (ref 22–32)
Calcium: 8 mg/dL — ABNORMAL LOW (ref 8.9–10.3)
Chloride: 117 mmol/L — ABNORMAL HIGH (ref 98–111)
Creatinine, Ser: 1.74 mg/dL — ABNORMAL HIGH (ref 0.61–1.24)
GFR, Estimated: 39 mL/min — ABNORMAL LOW (ref >60)
Glucose, Bld: 125 mg/dL — ABNORMAL HIGH (ref 70–99)
Potassium: 3.8 mmol/L (ref 3.5–5.1)
Sodium: 147 mmol/L — ABNORMAL HIGH (ref 135–145)
Total Bilirubin: 0.9 mg/dL (ref 0.3–1.2)
Total Protein: 6 g/dL — ABNORMAL LOW (ref 6.5–8.1)

## 2020-05-31 LAB — MAGNESIUM: Magnesium: 2.2 mg/dL (ref 1.7–2.4)

## 2020-05-31 LAB — RETICULOCYTES
Immature Retic Fract: 27.2 % — ABNORMAL HIGH (ref 2.3–15.9)
RBC.: 3.28 MIL/uL — ABNORMAL LOW (ref 4.22–5.81)
Retic Count, Absolute: 54.1 10*3/uL (ref 19.0–186.0)
Retic Ct Pct: 1.7 % (ref 0.4–3.1)

## 2020-05-31 LAB — PROCALCITONIN: Procalcitonin: 0.21 ng/mL

## 2020-05-31 LAB — FERRITIN: Ferritin: 348 ng/mL — ABNORMAL HIGH (ref 24–336)

## 2020-05-31 LAB — C-REACTIVE PROTEIN: CRP: 9.3 mg/dL — ABNORMAL HIGH (ref <1.0)

## 2020-05-31 LAB — VITAMIN B12: Vitamin B-12: 492 pg/mL (ref 180–914)

## 2020-05-31 LAB — D-DIMER, QUANTITATIVE: D-Dimer, Quant: 1.67 ug/mL-FEU — ABNORMAL HIGH (ref 0.00–0.50)

## 2020-05-31 LAB — FOLATE: Folate: 20 ng/mL (ref >5.9)

## 2020-05-31 MED ORDER — SODIUM CHLORIDE 0.9% FLUSH
10.0000 mL | Freq: Two times a day (BID) | INTRAVENOUS | Status: DC
  Administered 2020-05-31 – 2020-06-04 (×7): 10 mL
  Administered 2020-06-04: 20 mL
  Administered 2020-06-05 – 2020-06-06 (×3): 10 mL
  Administered 2020-06-08: 20 mL
  Administered 2020-06-08 – 2020-06-11 (×5): 10 mL

## 2020-05-31 MED ORDER — SODIUM CHLORIDE 0.9% FLUSH
10.0000 mL | INTRAVENOUS | Status: DC | PRN
Start: 2020-05-31 — End: 2020-06-11

## 2020-05-31 NOTE — Evaluation (Signed)
Physical Therapy Evaluation Patient Details Name: Nathan [name] MRN: [id number] DOB: 11/21/1940 Today's Date: 05/31/2020   History of Present Illness  Nathan [name] is a 80 y.o. male with PMH significant for history of atrial fibrillation on anticoagulation, hypertension and R humerus fx s/p ORIF 05/12/20.  Pt with positive COVID test at time of dc to rehab and now readmitted 2* increased confusion, hypotension and SOB.  Clinical Impression  Pt admitted as above and presenting with functional mobility limitations 2* generalized weakness, balance deficits, non-use of R LE 2* recent humeral fx and limited endurance.  Pt currently requiring significant assist of 2 for performance of all basic mobility tasks and will require further rehab at SNF level to maximize IND and safety.  Of note, at dc from [facility] 05/15/20, pt was NWB on R UE (allowed 5 lbs on elbow) with orders no ROM to R shoulder and for sling to support R UE.  No sling noted in pt room.  Pt not allowed to use R LE with this session and UE supported with pillows on pt return to bed.  RN made aware.    Follow Up Recommendations SNF    Equipment Recommendations  Wheelchair cushion (measurements PT);Wheelchair (measurements PT)    Recommendations for Other Services       Precautions / Restrictions Precautions Precautions: Shoulder Type of Shoulder Precautions: No AROM, No PROM Shoulder Interventions: Shoulder sling/immobilizer;At all times;Off for dressing/bathing/exercises Restrictions Weight Bearing Restrictions: Yes RUE Weight Bearing: Non weight bearing Other Position/Activity Restrictions: may bear 5 lbs to elbow.  Restrictions based on notes from 05/15/20 when pt dc to SNF.      Mobility  Bed Mobility Overal bed mobility: Needs Assistance Bed Mobility: Supine to Sit;Sit to Supine     Supine to sit: Mod assist;Max assist;+2 for physical assistance;+2 for safety/equipment Sit to supine: Mod assist;Max assist;+2 for physical  assistance;+2 for safety/equipment   General bed mobility comments: cues for sequence and non-use of R UE; physical assist to manage LEs and to control trunk    Transfers                 General transfer comment: Sit<>stand deferred 2* pt fatigue and desat in sitting to 79% on 6L  Ambulation/Gait                Stairs            Wheelchair Mobility    Modified Rankin (Stroke Patients Only)       Balance Overall balance assessment: Needs assistance Sitting-balance support: Single extremity supported;Feet supported Sitting balance-Leahy Scale: Poor Sitting balance - Comments: Leaning to the left - propping himself with left arm.                                     Pertinent Vitals/Pain Pain Assessment: No/denies pain    Home Living Family/patient expects to be discharged to:: Skilled nursing facility                      Prior Function Level of Independence: Needs assistance   Gait / Transfers Assistance Needed: Pt reports bed<>chair largely at SNF     Comments: Pt at SNF for rehab following R humeral fx     Hand Dominance   Dominant Hand: Right    Extremity/Trunk Assessment   Upper Extremity Assessment Upper Extremity Assessment: RUE deficits/detail RUE Deficits /  Details: R shoulder not assessed. Elbow, wrist, and fingers WFL ROM. LUE Deficits / Details: WFL ROM and strength. Crepitus noted in left shoulder. Arthritic changes in hand.    Lower Extremity Assessment Lower Extremity Assessment: Generalized weakness;RLE deficits/detail;LLE deficits/detail RLE Deficits / Details: AAROM WFL but strength ltd to 2+/5 at hip and 3+/5 at knee LLE Deficits / Details: AAROM WFL but strength limted 2+ at hip and 3+/5 at knee    Cervical / Trunk Assessment Cervical / Trunk Assessment: Kyphotic  Communication   Communication: HOH  Cognition Arousal/Alertness: Awake/alert Behavior During Therapy: WFL for tasks  assessed/performed Overall Cognitive Status: No family/caregiver present to determine baseline cognitive functioning                                 General Comments: mostly appropriate but confused to day and asking about rehab staff from SNF that worked with hime here.  Confusion vs HOH      General Comments      Exercises General Exercises - Lower Extremity Ankle Circles/Pumps: AROM;15 reps;Both;Supine   Assessment/Plan    PT Assessment Patient needs continued PT services  PT Problem List Decreased strength;Decreased range of motion;Decreased activity tolerance;Decreased balance;Decreased mobility;Decreased cognition;Decreased knowledge of use of DME       PT Treatment Interventions DME instruction;Gait training;Balance training;Therapeutic exercise;Functional mobility training;Therapeutic activities;Patient/family education;Wheelchair mobility training    PT Goals (Current goals can be found in the Care Plan section)  Acute Rehab PT Goals Patient Stated Goal: To get moving better PT Goal Formulation: With patient Time For Goal Achievement: 05/27/20 Potential to Achieve Goals: Fair    Frequency Min 2X/week   Barriers to discharge        Co-evaluation               AM-PAC PT "6 Clicks" Mobility  Outcome Measure Help needed turning from your back to your side while in a flat bed without using bedrails?: A Lot Help needed moving from lying on your back to sitting on the side of a flat bed without using bedrails?: A Lot Help needed moving to and from a bed to a chair (including a wheelchair)?: A Lot Help needed standing up from a chair using your arms (e.g., wheelchair or bedside chair)?: A Lot Help needed to walk in hospital room?: Total Help needed climbing 3-5 steps with a railing? : Total 6 Click Score: 10    End of Session Equipment Utilized During Treatment: Gait belt Activity Tolerance: Patient limited by fatigue Patient left: in bed;with  call bell/phone within reach;with bed alarm set Nurse Communication: Mobility status;Need for lift equipment PT Visit Diagnosis: Unsteadiness on feet (R26.81);Other abnormalities of gait and mobility (R26.89);Muscle weakness (generalized) (M62.81)    Time: 1418-1500 PT Time Calculation (min) (ACUTE ONLY): 42 min   Charges:   PT Evaluation $PT Eval Low Complexity: 1 Low PT Treatments $Therapeutic Activity: 23-37 mins        Debe Coder PT Acute Rehabilitation Services Pager [phone] Office [phone]   Delecia Vastine 05/31/2020, 4:02 PM

## 2020-05-31 NOTE — Progress Notes (Signed)
PROGRESS NOTE  Nathan [name] [id number]  DOB: Feb 01, 1941  PCP: Janith Lima, MD GLO:[id number]  DOA: 05/29/2020  LOS: 2 days   Chief Complaint  Patient presents with  . Altered Mental Status   Brief narrative: Nathan [name] is a 80 y.o. male with PMH significant for HTN, CKD 3B, chronic A. fib on Coumadin, left ventricle hypertrophy, thoracic aortic aneurysm, BPH, gout who lives at [facility]. Patient was brought to the ED on 2/11 for progressively worsening confusion, hypotension and hypoxia. Patient was recently hospitalized from 1/23-1/28 after a fall resulting in a fracture of the right humerus for which he underwent ORIF on 1/25 by Dr. Percell Miller. Of note, patient tested positive for Covid when checked prior to discharge to SNF. Per report, for last 2 to 3 days, patient has low oral intake, progressively worsening confusion and also shortness of breath. SNF staff noted his blood pressure to be low at 70/40 and hence EMS was called. EMS noted blood pressure low at 68/40, O2 sat was 80% on room air. He was given 500 cc of normal saline, placed on 4 L oxygen by nasal cannula and brought to the ED.  In the ED, patient was afebrile, tachypneic to 29. He initially was able to maintain saturation on 4 L oxygen but later required up to 12 L high flow by nasal cannula. Blood gas at that time showed a normal pH of 7.46, PCO2 low at 26.7 and a low PO2 at 56.6. Chest x-ray revealed bilateral patchy infiltrates consistent with Covid pneumonia.   Labs showed CRP elevated to 13.5, procalcitonin normal, WBC count normal, sodium level elevated 253, creatinine elevated 2.29, INR elevated to 8.8, with no evidence of bleeding. Patient received 1 mg of IV vitamin K, IV bolus or antibiotics. Admitted to hospitalist service  Subjective: Patient was seen and examined this morning. Propped up in bed.  Not in distress.  On 6 L oxygen by nasal bilaterally. Oriented to place and  person. Chart reviewed.  Assessment/Plan: COVID pneumonia Acute respiratory failure with hypoxia  -Presented with worsening shortness of breath, lethargy -COVID test: PCR positive on 1/27 -Chest imaging: Chest x-ray on admission showed new bilateral airspace lung opacities consistent with multifocal pneumonia  -Treatment: Currently on a 5-day course of IV remdesivir and Solu-Medrol IV 60 mg twice daily.  He was also started on baricitinib 1 mg daily on admission.  His oxygen requirement is improving.  He was on 10 L yesterday and on 6 L today.  Continue to wean down. -Currently also on broad-spectrum antibiotics.  Procalcitonin level trending down. -Oxygen - SpO2: 98 % O2 Flow Rate (L/min): 6 L/min -Supportive care: Vitamin C, Zinc, PRN inhalers, Tylenol, Antitussives (benzonatate/ Mucinex/Tussionex).   -Encouraged incentive spirometry, prone position, out of bed and early mobilization as much as possible -Continue airborne/contact isolation precautions for duration of 3 weeks from the day of diagnosis. -WBC and inflammatory markers trend as below. Recent Labs  Lab 05/29/20 1734 05/29/20 1929 05/29/20 1954 05/30/20 0508 05/31/20 0500 05/31/20 0737  WBC 8.7  --   --  6.0 7.9  --   LATICACIDVEN 1.6  2.5*  --  1.7  --   --   --   PROCALCITON  --  0.40  --   --  0.21  --   DDIMER  --  2.14*  --  1.93* 1.67*  --   FERRITIN  --   --   --   --   --  348*  CRP  --  13.5*  --  12.2*  --  9.3*  ALT 14  --   --  12 15  --    The treatment plan and use of medications and known side effects were discussed with patient/family. Some of the medications used are based on case reports/anecdotal data.  All other medications being used in the management of COVID-19 based on limited study data.  Complete risks and long-term side effects are unknown, however in the best clinical judgment they seem to be of some benefit.  Patient wanted to proceed with treatment options provided.  Abnormal  urinalysis -Urinalysis with hazy urine, leukocytes and bacteria probably because of dehydration.  AKI on CKD 3B -Creatinine was 1.89 on last discharge, presented with a creatinine elevated 2.29.  Improving with IV fluid, 1.74 today.  Continue IV fluid. Recent Labs    06/10/19 1055 05/10/20 0350 05/11/20 0521 05/12/20 0518 05/13/20 0906 05/14/20 0812 05/15/20 0318 05/29/20 1734 05/30/20 0508 05/31/20 0500  BUN 34* 45* 42* 49* 42* 49* 50* 72* 69* 75*  CREATININE 1.58* 2.01* 1.86* 2.03* 1.92* 1.96* 1.89* 2.29* 2.06* 1.74*   Acute hypernatremia -Due to poor oral intake.   -Currently on half-normal saline at 75 mill per hour.  Sodium level shows significant improvement.  Continue the same fluid at 50 mill per hour Recent Labs  Lab 05/29/20 1734 05/30/20 0508 05/31/20 0500  NA 153* 154* 147*   Lactic acidosis Non-anion gap metabolic acidosis -Probably secondary to decreased tissue perfusion from dehydration and also anaerobic respiration due to COVID pneumonia.  Acute metabolic encephalopathy Mild chronic cognitive dysfunction -Acute change in mental status because of dehydration, infection. -Mental status improving.  Continue to monitor.  Essential hypertension -Continue to monitor on Cardizem, metoprolol.   -On last hospitalization, lisinopril was stopped and Lasix dose was reduced.   -Pressure stable at this time.  Elevated INR -INR elevated to 8.8, further elevated to 9.4 this morning.  1 dose of vitamin K IV was given in the ED.  Patient not actively wheezing at this time.  Will let the INR drift down on its own.  Repeat INR tomorrow. Recent Labs  Lab 05/29/20 1734 05/30/20 0508  INR 8.8* 9.4*   Afib -On metoprolol and Cardizem.  Since blood pressure is improved, okay to continue both. -On long-term anticoagulation with Coumadin.  Supratherapeutic at this time as mentioned above. -During last hospitalization, I had a discussion with patient's son about  continuation of Coumadin.  At that time, patient's son wanted to continue Coumadin and revisit the decision later.  Acute on chronic anemia -Baseline hemoglobin above 10.  Hemoglobin 9.6 this morning.  Vitamin B12 and iron level seem adequate. -Continue to monitor.  Obtain anemia panel Recent Labs    05/14/20 0812 05/15/20 0318 05/29/20 1734 05/30/20 0508 05/31/20 0500 05/31/20 0737  HGB 10.9* 10.0* 10.0* 8.9* 9.6*  --   MCV 99.4 97.0 99.1 100.3* 99.4  --   VITAMINB12  --   --   --   --   --  492  FOLATE  --   --   --   --   --  20.0  FERRITIN  --   --   --   --   --  348*  TIBC  --   --   --   --   --  142*  IRON  --   --   --   --   --  51  RETICCTPCT  --   --   --   --  1.7  --    Recent comminuted fracture of right humerus -After mechanical fall. -1/25, underwent ORIF of right humerus by Dr. Percell Miller. -Continue pain control with oral oxycodone. -Orthopedics recommended NWBRUEto the shoulder, minimal WB to the right hand through the elbow.  Impaired mobility -Prior to last admission, patient was using a walker at home. -Discharged to SNF on last admission  Mild protein calorie malnutrition Hypoalbuminemia -Albumin level low at 1.9.  Secondary to poor appetite, poor p.o. intake -Nutrition consult  Gout -Continuing home regimen of allopurinol  Mobility: PT eval ordered Code Status:   Code Status: Full Code  Nutritional status: Body mass index is 27.25 kg/m. Nutrition Problem: Inadequate oral intake Etiology: poor appetite Signs/Symptoms: meal completion < 25%,per patient/family report Diet Order            Diet Heart Room service appropriate? Yes; Fluid consistency: Thin  Diet effective now                 DVT prophylaxis: SCDs Start: 05/29/20 2011   Antimicrobials:  Currently on IV Rocephin Fluid: Half-normal saline at 50 mill per hour Consultants: None Family Communication:  None at bedside  Status is: Inpatient  Remains inpatient appropriate  because: On IV treatment for Covid pneumonia, dehydration, AKI.  Dispo: The patient is from: SNF              Anticipated d/c is to: SNF              Anticipated d/c date is: 3 days              Patient currently is not medically stable to d/c.   Difficult to place patient No  Infusions:  . sodium chloride 75 mL/hr at 05/31/20 0955  . cefTRIAXone (ROCEPHIN)  IV 1 g (05/31/20 0233)  . remdesivir 100 mg in NS 100 mL 100 mg (05/31/20 0948)    Scheduled Meds: . allopurinol  100 mg Oral Daily  . vitamin C  500 mg Oral Daily  . baricitinib  2 mg Oral Daily  . diltiazem  120 mg Oral Daily  . finasteride  5 mg Oral Daily  . methylPREDNISolone (SOLU-MEDROL) injection  60 mg Intravenous Q12H  . metoprolol tartrate  50 mg Oral BID  . zinc sulfate  220 mg Oral Daily    Antimicrobials: Anti-infectives (From admission, onward)   Start     Dose/Rate Route Frequency Ordered Stop   05/30/20 1000  remdesivir 100 mg in sodium chloride 0.9 % 100 mL IVPB       "Followed by" Linked Group Details   100 mg 200 mL/hr over 30 Minutes Intravenous Daily 05/29/20 2010 06/03/20 0959   05/30/20 0200  cefTRIAXone (ROCEPHIN) 1 g in sodium chloride 0.9 % 100 mL IVPB        1 g 200 mL/hr over 30 Minutes Intravenous Every 24 hours 05/29/20 2108     05/29/20 2200  remdesivir 200 mg in sodium chloride 0.9% 250 mL IVPB       "Followed by" Linked Group Details   200 mg 580 mL/hr over 30 Minutes Intravenous Once 05/29/20 2010 05/29/20 2317   05/29/20 1845  vancomycin (VANCOREADY) IVPB 1750 mg/350 mL        1,750 mg 175 mL/hr over 120 Minutes Intravenous  Once 05/29/20 1835 05/29/20 2200   05/29/20 1830  vancomycin (VANCOCIN) IVPB 1000 mg/200 mL premix  Status:  Discontinued        1,000 mg 200 mL/hr over 60 Minutes Intravenous  Once 05/29/20 1826 05/29/20 1835   05/29/20 1830  ceFEPIme (MAXIPIME) 2 g in sodium chloride 0.9 % 100 mL IVPB        2 g 200 mL/hr over 30 Minutes Intravenous  Once 05/29/20 1826  05/29/20 1959      PRN meds: acetaminophen, albuterol, diphenhydrAMINE-zinc acetate, guaiFENesin-dextromethorphan, melatonin, ondansetron **OR** ondansetron (ZOFRAN) IV, polyethylene glycol   Objective: Vitals:   05/31/20 0410 05/31/20 1205  BP: (!) 127/94 116/82  Pulse: 69 64  Resp: (!) 21 19  Temp: 97.9 F (36.6 C) 97.7 F (36.5 C)  SpO2: 93% 98%    Intake/Output Summary (Last 24 hours) at 05/31/2020 1302 Last data filed at 05/31/2020 0400 Gross per 24 hour  Intake 1396.69 ml  Output 303 ml  Net 1093.69 ml   Filed Weights   05/29/20 1802 05/30/20 0224  Weight: 94.8 kg 83.7 kg   Weight change:  Body mass index is 27.25 kg/m.   Physical Exam: General exam: Pleasant elderly Caucasian male.  Propped up in bed.  Not in physical distress Skin: No rashes, lesions or ulcers. HEENT: Atraumatic, normocephalic, no obvious bleeding Lungs: Improving aeration bilaterally.  No crackles or wheezing CVS: Regular rate and rhythm, no murmur GI/Abd soft, nontender, nondistended, bowel sound present CNS: Alert, awake, oriented to place and person, Psychiatry: Depressed look Extremities: No pedal edema, no calf tenderness  Data Review: I have personally reviewed the laboratory data and studies available.  Recent Labs  Lab 05/29/20 1734 05/30/20 0508 05/31/20 0500  WBC 8.7 6.0 7.9  NEUTROABS 7.4 5.6 7.4  HGB 10.0* 8.9* 9.6*  HCT 34.2* 30.4* 32.5*  MCV 99.1 100.3* 99.4  PLT 363 268 329   Recent Labs  Lab 05/29/20 1734 05/30/20 0508 05/31/20 0500  NA 153* 154* 147*  K 4.0 4.2 3.8  CL 118* 124* 117*  CO2 21* 18* 17*  GLUCOSE 115* 138* 125*  BUN 72* 69* 75*  CREATININE 2.29* 2.06* 1.74*  CALCIUM 8.1* 7.8* 8.0*  MG  --  2.0 2.2    F/u labs ordered Unresulted Labs (From admission, onward)          Start     Ordered   06/01/20 0500  Protime-INR  Daily,   R      05/31/20 1300   05/31/20 0500  Procalcitonin  Daily,   R      05/30/20 1443   05/30/20 0500  CBC with  Differential/Platelet  Daily,   R      05/29/20 2010   05/30/20 0500  Comprehensive metabolic panel  Daily,   R      05/29/20 2010   05/30/20 0500  C-reactive protein  Daily,   R      05/29/20 2010   05/30/20 0500  D-dimer, quantitative (not at [facility])  Daily,   R      05/29/20 2010   05/30/20 0500  Magnesium  Daily,   R      05/29/20 2010         Signed, [name], MD [facility] 05/31/2020

## 2020-05-31 NOTE — Progress Notes (Signed)
Initial Nutrition Assessment  DOCUMENTATION CODES:   Not applicable  INTERVENTION:   Liberalize diet to REGULAR  Ensure Enlive po TID, each supplement provides 350 kcal and 20 grams of protein  MVI with Minerals daily  NUTRITION DIAGNOSIS:   Inadequate oral intake related to poor appetite as evidenced by meal completion < 25%,per patient/family report.   GOAL:   Patient will meet greater than or equal to 90% of their needs  MONITOR:   PO intake,Supplement acceptance,Weight trends,Labs  REASON FOR ASSESSMENT:   Consult Assessment of nutrition requirement/status  ASSESSMENT:   80 yo male admitted with acute respiratory failure with COVID pneumonia, AKI on CKD 3, acute hypernatremia in setting of poor po intake. PMH includes CKD, HTN   Recorded po intake 25% of meals. SLP evaluated yesterday, no signs of aspiration  Per weight encounters, pt has experienced recent wt loss; current wt 83.7 kg. Noted weight of 94.8 kg in jan 2022, 88.5 kg in October 2021.   Noted pt with stage II/III pressure injuries present on admission  Labs: sodium 147 improved from 154, Creatinine 1.74 Meds: 1/2 NS at 75 ml/hr, ascorbic acid, solumedrol, zinc sulfate   Diet Order:   Diet Order            Diet Heart Room service appropriate? Yes; Fluid consistency: Thin  Diet effective now                 EDUCATION NEEDS:   Not appropriate for education at this time  Skin:  Skin Assessment: Skin Integrity Issues: Skin Integrity Issues:: Stage II,Stage III Stage II: perineum Stage III: heel  Last BM:  2/13  Height:   Ht Readings from Last 1 Encounters:  05/30/20 5\' 9"  (1.753 m)    Weight:   Wt Readings from Last 1 Encounters:  05/30/20 83.7 kg    BMI:  Body mass index is 27.25 kg/m.  Estimated Nutritional Needs:   Kcal:  1950-2150 kcals  Protein:  110-125 g  Fluid:  >/= 2 L   Kerman Passey MS, RDN, LDN, CNSC Registered Dietitian III Clinical Nutrition RD Pager  and On-Call Pager Number Located in [place]

## 2020-06-01 DIAGNOSIS — U071 COVID-19: Secondary | ICD-10-CM | POA: Diagnosis not present

## 2020-06-01 DIAGNOSIS — J9601 Acute respiratory failure with hypoxia: Secondary | ICD-10-CM | POA: Diagnosis not present

## 2020-06-01 LAB — URINE CULTURE: Culture: >=100000 — AB

## 2020-06-01 LAB — COMPREHENSIVE METABOLIC PANEL
ALT: 15 U/L (ref 0–44)
AST: 18 U/L (ref 15–41)
Albumin: 2 g/dL — ABNORMAL LOW (ref 3.5–5.0)
Alkaline Phosphatase: 81 U/L (ref 38–126)
Anion gap: 13 (ref 5–15)
BUN: 78 mg/dL — ABNORMAL HIGH (ref 8–23)
CO2: 14 mmol/L — ABNORMAL LOW (ref 22–32)
Calcium: 7.5 mg/dL — ABNORMAL LOW (ref 8.9–10.3)
Chloride: 117 mmol/L — ABNORMAL HIGH (ref 98–111)
Creatinine, Ser: 1.53 mg/dL — ABNORMAL HIGH (ref 0.61–1.24)
GFR, Estimated: 45 mL/min — ABNORMAL LOW (ref >60)
Glucose, Bld: 116 mg/dL — ABNORMAL HIGH (ref 70–99)
Potassium: 3.8 mmol/L (ref 3.5–5.1)
Sodium: 144 mmol/L (ref 135–145)
Total Bilirubin: 0.9 mg/dL (ref 0.3–1.2)
Total Protein: 5.7 g/dL — ABNORMAL LOW (ref 6.5–8.1)

## 2020-06-01 LAB — CBC WITH DIFFERENTIAL/PLATELET
Abs Immature Granulocytes: 0.08 10*3/uL — ABNORMAL HIGH (ref 0.00–0.07)
Basophils Absolute: 0 10*3/uL (ref 0.0–0.1)
Basophils Relative: 0 %
Eosinophils Absolute: 0 10*3/uL (ref 0.0–0.5)
Eosinophils Relative: 0 %
HCT: 28.8 % — ABNORMAL LOW (ref 39.0–52.0)
Hemoglobin: 8.9 g/dL — ABNORMAL LOW (ref 13.0–17.0)
Immature Granulocytes: 1 %
Lymphocytes Relative: 4 %
Lymphs Abs: 0.3 10*3/uL — ABNORMAL LOW (ref 0.7–4.0)
MCH: 29.5 pg (ref 26.0–34.0)
MCHC: 30.9 g/dL (ref 30.0–36.0)
MCV: 95.4 fL (ref 80.0–100.0)
Monocytes Absolute: 0.1 10*3/uL (ref 0.1–1.0)
Monocytes Relative: 1 %
Neutro Abs: 7.3 10*3/uL (ref 1.7–7.7)
Neutrophils Relative %: 94 %
Platelets: 355 10*3/uL (ref 150–400)
RBC: 3.02 MIL/uL — ABNORMAL LOW (ref 4.22–5.81)
RDW: 15.4 % (ref 11.5–15.5)
WBC: 7.7 10*3/uL (ref 4.0–10.5)
nRBC: 0 % (ref 0.0–0.2)

## 2020-06-01 LAB — PROTIME-INR
INR: 10.1 (ref 0.8–1.2)
Prothrombin Time: 77.9 seconds — ABNORMAL HIGH (ref 11.4–15.2)

## 2020-06-01 LAB — MAGNESIUM: Magnesium: 2.2 mg/dL (ref 1.7–2.4)

## 2020-06-01 LAB — PROCALCITONIN: Procalcitonin: 0.13 ng/mL

## 2020-06-01 LAB — D-DIMER, QUANTITATIVE: D-Dimer, Quant: 1.15 ug/mL-FEU — ABNORMAL HIGH (ref 0.00–0.50)

## 2020-06-01 LAB — C-REACTIVE PROTEIN: CRP: 4.9 mg/dL — ABNORMAL HIGH (ref <1.0)

## 2020-06-01 MED ORDER — LOPERAMIDE HCL 2 MG PO CAPS: 2.0000 mg | ORAL_CAPSULE | ORAL | Status: DC | PRN

## 2020-06-01 MED ORDER — AMOXICILLIN 500 MG PO CAPS
500.0000 mg | ORAL_CAPSULE | Freq: Three times a day (TID) | ORAL | Status: DC
  Administered 2020-06-01 – 2020-06-03 (×8): 500 mg via ORAL
  Filled 2020-06-01 (×2): qty 2
  Filled 2020-06-01: qty 1
  Filled 2020-06-01 (×3): qty 2
  Filled 2020-06-01: qty 1

## 2020-06-01 NOTE — NC FL2 (Signed)
[place] MEDICAID FL2 LEVEL OF CARE SCREENING TOOL     IDENTIFICATION  Patient Name: Nathan [name] Birthdate: 1940-09-14 Sex: male Admission Date (Current Location): 05/29/2020  [facility] and [place] Number:  Herbalist and Address:  [facility],  [place] [address] [place]      Provider Number: [id number]  Attending Physician Name and Address:  [name], MD  Relative Name and Phone Number:  Kairav Russomanno [phone], Eliyah Bazzi [phone]    Current Level of Care: Hospital Recommended Level of Care: [place] Prior Approval Number:    Date Approved/Denied:   PASRR Number: [id number]  Discharge Plan: SNF    Current Diagnoses: Patient Active Problem List   Diagnosis Date Noted  . Pressure injury of skin 05/30/2020  . Acute hypoxemic respiratory failure due to COVID-19 ([place]) 05/29/2020  . Hypernatremia 05/29/2020  . Urinary tract infection 05/29/2020  . Lactic acidosis 05/29/2020  . Acute metabolic encephalopathy 51/76/1607  . Comminuted fracture of right humerus 05/10/2020  . Fall at home, initial encounter 05/10/2020  . Atrial fibrillation, chronic ([place]) 05/10/2020  . Elevated serum creatinine 05/10/2020  . Unilateral primary osteoarthritis, left knee 05/05/2020  . Tinea corporis 10/06/2019  . LVH (left ventricular hypertrophy) due to hypertensive disease, with heart failure ([place]) 06/10/2019  . Thoracic aortic aneurysm, without rupture ([place]) 12/31/2018  . Long term (current) use of anticoagulants 01/06/2017  . Eczema, dyshidrotic 09/07/2016  . Warfarin-induced coagulopathy ([place]) 01/20/2015  . Abnormality of gait due to impairment of balance 10/30/2014  . Hypertriglyceridemia 03/04/2014  . Encounter for therapeutic drug monitoring 05/22/2013  . Vitamin D deficiency 11/16/2012  . Gout 11/16/2012  . Chronic kidney disease, stage 3b ([place]) 03/18/2009  . OSTEOARTHRITIS 03/18/2009  . RENAL CYST  06/14/2007  . Essential hypertension 09/04/2006  . Deficiency anemia 08/31/2006    Orientation RESPIRATION BLADDER Height & Weight     Self,Time  Normal Incontinent,External catheter Weight: 87 kg Height:  5\' 9"  (175.3 cm)  BEHAVIORAL SYMPTOMS/MOOD NEUROLOGICAL BOWEL NUTRITION STATUS      Incontinent Diet (Heart Healthy)  AMBULATORY STATUS COMMUNICATION OF NEEDS Skin   Extensive Assist Verbally PU Stage and Appropriate Care (Pressure injury sacrum medical Stage 2, Right heel posterior stage press injury Stage 3)   PU Stage 2 Dressing:  (PRN, as needed, change when soiled) PU Stage 3 Dressing: QID                 Personal Care Assistance Level of Assistance  Bathing,Feeding,Dressing Bathing Assistance: Maximum assistance Feeding assistance: Limited assistance Dressing Assistance: Maximum assistance     Functional Limitations Info  Sight,Hearing,Speech Sight Info: Impaired Hearing Info: Impaired Speech Info: Adequate    SPECIAL CARE FACTORS FREQUENCY  PT (By licensed PT),OT (By licensed OT)     PT Frequency: x5 week OT Frequency: X5 week            Contractures Contractures Info: Not present    Additional Factors Info  Code Status,Allergies Code Status Info: FULL Allergies Info: Tramadol           Current Medications (06/01/2020):  This is the current hospital active medication list Current Facility-Administered Medications  Medication Dose Route Frequency Provider Last Rate Last Admin  . acetaminophen (TYLENOL) tablet 650 mg  650 mg Oral Q6H PRN Shalhoub, Sherryll Burger, MD      . albuterol (VENTOLIN HFA) 108 (90 Base) MCG/ACT inhaler 2 puff  2 puff Inhalation Q4H PRN Shalhoub, Sherryll Burger,  MD      . allopurinol (ZYLOPRIM) tablet 100 mg  100 mg Oral Daily Shalhoub, Sherryll Burger, MD   100 mg at 06/01/20 0917  . amoxicillin (AMOXIL) capsule 500 mg  500 mg Oral Q8H Dahal, Binaya, MD   500 mg at 06/01/20 0918  . ascorbic acid (VITAMIN C) tablet 500 mg  500 mg Oral Daily  Shalhoub, Sherryll Burger, MD   500 mg at 06/01/20 0917  . baricitinib (OLUMIANT) tablet 2 mg  2 mg Oral Daily Dahal, Binaya, MD   2 mg at 05/31/20 2106  . diltiazem (CARDIZEM CD) 24 hr capsule 120 mg  120 mg Oral Daily Shalhoub, Sherryll Burger, MD   120 mg at 06/01/20 0917  . diphenhydrAMINE-zinc acetate (BENADRYL) 2-0.1 % cream   Topical TID PRN Blount, Scarlette Shorts T, NP      . finasteride (PROSCAR) tablet 5 mg  5 mg Oral Daily Shalhoub, Sherryll Burger, MD   5 mg at 06/01/20 0917  . guaiFENesin-dextromethorphan (ROBITUSSIN DM) 100-10 MG/5ML syrup 10 mL  10 mL Oral Q4H PRN Vernelle Emerald, MD   10 mL at 05/31/20 0952  . loperamide (IMODIUM) capsule 2 mg  2 mg Oral PRN Dahal, Binaya, MD      . melatonin tablet 3 mg  3 mg Oral QHS PRN Shalhoub, Sherryll Burger, MD   3 mg at 05/31/20 2106  . methylPREDNISolone sodium succinate (SOLU-MEDROL) 125 mg/2 mL injection 60 mg  60 mg Intravenous Q12H Dahal, Marlowe Aschoff, MD   60 mg at 06/01/20 0912  . metoprolol tartrate (LOPRESSOR) tablet 50 mg  50 mg Oral BID Vernelle Emerald, MD   50 mg at 06/01/20 0917  . ondansetron (ZOFRAN) tablet 4 mg  4 mg Oral Q6H PRN Shalhoub, Sherryll Burger, MD       Or  . ondansetron [facility]) injection 4 mg  4 mg Intravenous Q6H PRN Shalhoub, Sherryll Burger, MD      . remdesivir 100 mg in sodium chloride 0.9 % 100 mL IVPB  100 mg Intravenous Daily Shalhoub, Sherryll Burger, MD 200 mL/hr at 06/01/20 0916 100 mg at 06/01/20 0916  . sodium chloride flush (NS) 0.9 % injection 10-40 mL  10-40 mL Intracatheter Q12H Dahal, Marlowe Aschoff, MD   10 mL at 06/01/20 0918  . sodium chloride flush (NS) 0.9 % injection 10-40 mL  10-40 mL Intracatheter PRN Dahal, Binaya, MD      . zinc sulfate capsule 220 mg  220 mg Oral Daily Shalhoub, Sherryll Burger, MD   220 mg at 06/01/20 1610     Discharge Medications: Please see discharge summary for a list of discharge medications.  Relevant Imaging Results:  Relevant Lab Results:   Additional Information [phone]  Purcell Mouton, RN

## 2020-06-01 NOTE — [place] FL2 (Deleted)
[place] MEDICAID FL2 LEVEL OF CARE SCREENING TOOL     IDENTIFICATION  Patient Name: Nathan [name] [id number] Birthdate: Dec 17, 1940 Sex: male Admission Date (Current Location): 05/29/2020  [facility] and [place] Number:  Herbalist and Address:  [facility],  [place] [address], [place]      Provider Number: [id number]  Attending Physician Name and Address:  [name], MD  Relative Name and Phone Number:  Xylan Sheils son [phone],    Current Level of Care: Hospital Recommended Level of Care: [place] Prior Approval Number:    Date Approved/Denied:   PASRR Number: [id number]  Discharge Plan: SNF    Current Diagnoses: Patient Active Problem List   Diagnosis Date Noted  . Pressure injury of skin 05/30/2020  . Acute hypoxemic respiratory failure due to COVID-19 ([place]) 05/29/2020  . Hypernatremia 05/29/2020  . Urinary tract infection 05/29/2020  . Lactic acidosis 05/29/2020  . Acute metabolic encephalopathy 92/02/9416  . Comminuted fracture of right humerus 05/10/2020  . Fall at home, initial encounter 05/10/2020  . Atrial fibrillation, chronic ([place]) 05/10/2020  . Elevated serum creatinine 05/10/2020  . Unilateral primary osteoarthritis, left knee 05/05/2020  . Tinea corporis 10/06/2019  . LVH (left ventricular hypertrophy) due to hypertensive disease, with heart failure ([place]) 06/10/2019  . Thoracic aortic aneurysm, without rupture ([place]) 12/31/2018  . Long term (current) use of anticoagulants 01/06/2017  . Eczema, dyshidrotic 09/07/2016  . Warfarin-induced coagulopathy ([place]) 01/20/2015  . Abnormality of gait due to impairment of balance 10/30/2014  . Hypertriglyceridemia 03/04/2014  . Encounter for therapeutic drug monitoring 05/22/2013  . Vitamin D deficiency 11/16/2012  . Gout 11/16/2012  . Chronic kidney disease, stage 3b ([place]) 03/18/2009  . OSTEOARTHRITIS 03/18/2009  . RENAL CYST 06/14/2007  . Essential  hypertension 09/04/2006  . Deficiency anemia 08/31/2006    Orientation RESPIRATION BLADDER Height & Weight     Self,Time  O2 (3L) Continent Weight: 87 kg Height:  5\' 9"  (175.3 cm)  BEHAVIORAL SYMPTOMS/MOOD NEUROLOGICAL BOWEL NUTRITION STATUS      Continent Diet (Heart Healthy)  AMBULATORY STATUS COMMUNICATION OF NEEDS Skin   Extensive Assist Verbally Normal                       Personal Care Assistance Level of Assistance  Bathing,Feeding,Dressing Bathing Assistance: Maximum assistance Feeding assistance: Independent Dressing Assistance: Maximum assistance     Functional Limitations Info  Sight,Hearing,Speech Sight Info: Impaired Hearing Info: Impaired Speech Info: Adequate    SPECIAL CARE FACTORS FREQUENCY  PT (By licensed PT),OT (By licensed OT)     PT Frequency: x5 week OT Frequency: x5 week            Contractures Contractures Info: Not present    Additional Factors Info  Code Status,Allergies Code Status Info: FULL Allergies Info: Tramadol           Current Medications (06/01/2020):  This is the current hospital active medication list Current Facility-Administered Medications  Medication Dose Route Frequency Provider Last Rate Last Admin  . acetaminophen (TYLENOL) tablet 650 mg  650 mg Oral Q6H PRN Shalhoub, Sherryll Burger, MD      . albuterol (VENTOLIN HFA) 108 (90 Base) MCG/ACT inhaler 2 puff  2 puff Inhalation Q4H PRN Shalhoub, Sherryll Burger, MD      . allopurinol (ZYLOPRIM) tablet 100 mg  100 mg Oral Daily Shalhoub, Sherryll Burger, MD   100 mg at 06/01/20 0917  . amoxicillin (AMOXIL) capsule 500 mg  500 mg Oral Q8H Dahal, Marlowe Aschoff, MD   500 mg at 06/01/20 0918  . ascorbic acid (VITAMIN C) tablet 500 mg  500 mg Oral Daily Shalhoub, Sherryll Burger, MD   500 mg at 06/01/20 0917  . baricitinib (OLUMIANT) tablet 2 mg  2 mg Oral Daily Dahal, Binaya, MD   2 mg at 05/31/20 2106  . diltiazem (CARDIZEM CD) 24 hr capsule 120 mg  120 mg Oral Daily Shalhoub, Sherryll Burger, MD   120 mg  at 06/01/20 0917  . diphenhydrAMINE-zinc acetate (BENADRYL) 2-0.1 % cream   Topical TID PRN Blount, Scarlette Shorts T, NP      . finasteride (PROSCAR) tablet 5 mg  5 mg Oral Daily Shalhoub, Sherryll Burger, MD   5 mg at 06/01/20 0917  . guaiFENesin-dextromethorphan (ROBITUSSIN DM) 100-10 MG/5ML syrup 10 mL  10 mL Oral Q4H PRN Vernelle Emerald, MD   10 mL at 05/31/20 0952  . melatonin tablet 3 mg  3 mg Oral QHS PRN Vernelle Emerald, MD   3 mg at 05/31/20 2106  . methylPREDNISolone sodium succinate (SOLU-MEDROL) 125 mg/2 mL injection 60 mg  60 mg Intravenous Q12H Dahal, Marlowe Aschoff, MD   60 mg at 06/01/20 0912  . metoprolol tartrate (LOPRESSOR) tablet 50 mg  50 mg Oral BID Vernelle Emerald, MD   50 mg at 06/01/20 0917  . ondansetron (ZOFRAN) tablet 4 mg  4 mg Oral Q6H PRN Shalhoub, Sherryll Burger, MD       Or  . ondansetron [facility]) injection 4 mg  4 mg Intravenous Q6H PRN Shalhoub, Sherryll Burger, MD      . polyethylene glycol (MIRALAX / GLYCOLAX) packet 17 g  17 g Oral Daily PRN Shalhoub, Sherryll Burger, MD      . remdesivir 100 mg in sodium chloride 0.9 % 100 mL IVPB  100 mg Intravenous Daily Shalhoub, Sherryll Burger, MD 200 mL/hr at 06/01/20 0916 100 mg at 06/01/20 0916  . sodium chloride flush (NS) 0.9 % injection 10-40 mL  10-40 mL Intracatheter Q12H Dahal, Marlowe Aschoff, MD   10 mL at 06/01/20 0918  . sodium chloride flush (NS) 0.9 % injection 10-40 mL  10-40 mL Intracatheter PRN Dahal, Binaya, MD      . zinc sulfate capsule 220 mg  220 mg Oral Daily Shalhoub, Sherryll Burger, MD   220 mg at 06/01/20 1610     Discharge Medications: Please see discharge summary for a list of discharge medications.  Relevant Imaging Results:  Relevant Lab Results:   Additional Information [phone]  Purcell Mouton, RN

## 2020-06-01 NOTE — Consult Note (Signed)
[place] Nurse Consult Note: Reason for Consult: Consult requested for sacrum and right heel. Pt is in isolation for Covid. Wound type: Sacrum with stage 2 pressure injury, 1X.2X.1cm, pink and dry.  Pt is frequently incontinent of stool and it is difficult to keep the affected area from becoming soiled.  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  Pressure Injury POA: Yes Dressing procedure/placement/frequency: Topical treatment orders provided for bedside nurses to perform to promote healing as follows: Float heels to reduce pressure. Apply a piece of Aquacel to right heel wound Q day, then cover with foam dressing.  (Change foam dressing Q 3 days or PRN soiling.) Barrier cream to buttocks and sacrum to repel moisture and protect the affected area.  Please re-consult if further assistance is needed.  Thank-you,  Julien Girt MSN, [place], [place], [place], [place]

## 2020-06-01 NOTE — NC FL2 (Signed)
[place] MEDICAID FL2 LEVEL OF CARE SCREENING TOOL     IDENTIFICATION  Patient Name: Nathan [name] [id number] Birthdate: 11-22-1940 Sex: male Admission Date (Current Location): 05/29/2020  [facility] and [place] Number:  Herbalist and Address:  [facility],  [place] [address] [place]      Provider Number: [id number]  Attending Physician Name and Address:  [name], MD  Relative Name and Phone Number:  Yarnell Arvidson [phone], Montee Tallman [phone]    Current Level of Care: Hospital Recommended Level of Care: [place] Prior Approval Number:    Date Approved/Denied:   PASRR Number: [id number]  Discharge Plan: SNF    Current Diagnoses: Patient Active Problem List   Diagnosis Date Noted  . Pressure injury of skin 05/30/2020  . Acute hypoxemic respiratory failure due to COVID-19 ([place]) 05/29/2020  . Hypernatremia 05/29/2020  . Urinary tract infection 05/29/2020  . Lactic acidosis 05/29/2020  . Acute metabolic encephalopathy 37/01/6268  . Comminuted fracture of right humerus 05/10/2020  . Fall at home, initial encounter 05/10/2020  . Atrial fibrillation, chronic ([place]) 05/10/2020  . Elevated serum creatinine 05/10/2020  . Unilateral primary osteoarthritis, left knee 05/05/2020  . Tinea corporis 10/06/2019  . LVH (left ventricular hypertrophy) due to hypertensive disease, with heart failure ([place]) 06/10/2019  . Thoracic aortic aneurysm, without rupture ([place]) 12/31/2018  . Long term (current) use of anticoagulants 01/06/2017  . Eczema, dyshidrotic 09/07/2016  . Warfarin-induced coagulopathy ([place]) 01/20/2015  . Abnormality of gait due to impairment of balance 10/30/2014  . Hypertriglyceridemia 03/04/2014  . Encounter for therapeutic drug monitoring 05/22/2013  . Vitamin D deficiency 11/16/2012  . Gout 11/16/2012  . Chronic kidney disease, stage 3b ([place]) 03/18/2009  . OSTEOARTHRITIS 03/18/2009  . RENAL CYST  06/14/2007  . Essential hypertension 09/04/2006  . Deficiency anemia 08/31/2006    Orientation RESPIRATION BLADDER Height & Weight     Self,Time  Normal Incontinent,External catheter Weight: 87 kg Height:  5\' 9"  (175.3 cm)  BEHAVIORAL SYMPTOMS/MOOD NEUROLOGICAL BOWEL NUTRITION STATUS      Incontinent Diet (Heart Healthy)  AMBULATORY STATUS COMMUNICATION OF NEEDS Skin   Extensive Assist Verbally PU Stage and Appropriate Care (Pressure injury sacrum medical Stage 2, Right heel posterior stage press injury Stage 3)   PU Stage 2 Dressing:  (PRN, as needed, change when soiled) PU Stage 3 Dressing: QID                 Personal Care Assistance Level of Assistance  Bathing,Feeding,Dressing Bathing Assistance: Maximum assistance Feeding assistance: Limited assistance Dressing Assistance: Maximum assistance     Functional Limitations Info  Sight,Hearing,Speech Sight Info: Impaired Hearing Info: Impaired Speech Info: Adequate    SPECIAL CARE FACTORS FREQUENCY  PT (By licensed PT),OT (By licensed OT)     PT Frequency: x5 week OT Frequency: X5 week            Contractures Contractures Info: Not present    Additional Factors Info  Code Status,Allergies Code Status Info: FULL Allergies Info: Tramadol           Current Medications (06/01/2020):  This is the current hospital active medication list Current Facility-Administered Medications  Medication Dose Route Frequency Provider Last Rate Last Admin  . acetaminophen (TYLENOL) tablet 650 mg  650 mg Oral Q6H PRN Shalhoub, Sherryll Burger, MD      . albuterol (VENTOLIN HFA) 108 (90 Base) MCG/ACT inhaler 2 puff  2 puff Inhalation Q4H PRN Shalhoub, Sherryll Burger,  MD      . allopurinol (ZYLOPRIM) tablet 100 mg  100 mg Oral Daily Shalhoub, Sherryll Burger, MD   100 mg at 06/01/20 0917  . amoxicillin (AMOXIL) capsule 500 mg  500 mg Oral Q8H Dahal, Binaya, MD   500 mg at 06/01/20 0918  . ascorbic acid (VITAMIN C) tablet 500 mg  500 mg Oral Daily  Shalhoub, Sherryll Burger, MD   500 mg at 06/01/20 0917  . baricitinib (OLUMIANT) tablet 2 mg  2 mg Oral Daily Dahal, Binaya, MD   2 mg at 05/31/20 2106  . diltiazem (CARDIZEM CD) 24 hr capsule 120 mg  120 mg Oral Daily Shalhoub, Sherryll Burger, MD   120 mg at 06/01/20 0917  . diphenhydrAMINE-zinc acetate (BENADRYL) 2-0.1 % cream   Topical TID PRN Blount, Scarlette Shorts T, NP      . finasteride (PROSCAR) tablet 5 mg  5 mg Oral Daily Shalhoub, Sherryll Burger, MD   5 mg at 06/01/20 0917  . guaiFENesin-dextromethorphan (ROBITUSSIN DM) 100-10 MG/5ML syrup 10 mL  10 mL Oral Q4H PRN Vernelle Emerald, MD   10 mL at 05/31/20 0952  . loperamide (IMODIUM) capsule 2 mg  2 mg Oral PRN Dahal, Binaya, MD      . melatonin tablet 3 mg  3 mg Oral QHS PRN Shalhoub, Sherryll Burger, MD   3 mg at 05/31/20 2106  . methylPREDNISolone sodium succinate (SOLU-MEDROL) 125 mg/2 mL injection 60 mg  60 mg Intravenous Q12H Dahal, Marlowe Aschoff, MD   60 mg at 06/01/20 0912  . metoprolol tartrate (LOPRESSOR) tablet 50 mg  50 mg Oral BID Vernelle Emerald, MD   50 mg at 06/01/20 0917  . ondansetron (ZOFRAN) tablet 4 mg  4 mg Oral Q6H PRN Shalhoub, Sherryll Burger, MD       Or  . ondansetron [facility]) injection 4 mg  4 mg Intravenous Q6H PRN Shalhoub, Sherryll Burger, MD      . remdesivir 100 mg in sodium chloride 0.9 % 100 mL IVPB  100 mg Intravenous Daily Shalhoub, Sherryll Burger, MD 200 mL/hr at 06/01/20 0916 100 mg at 06/01/20 0916  . sodium chloride flush (NS) 0.9 % injection 10-40 mL  10-40 mL Intracatheter Q12H Dahal, Marlowe Aschoff, MD   10 mL at 06/01/20 0918  . sodium chloride flush (NS) 0.9 % injection 10-40 mL  10-40 mL Intracatheter PRN Dahal, Binaya, MD      . zinc sulfate capsule 220 mg  220 mg Oral Daily Shalhoub, Sherryll Burger, MD   220 mg at 06/01/20 4163     Discharge Medications: Please see discharge summary for a list of discharge medications.  Relevant Imaging Results:  Relevant Lab Results:   Additional Information [phone]  Purcell Mouton, RN

## 2020-06-01 NOTE — TOC Progression Note (Signed)
Transition of Care [facility]) - Progression Note    Patient Details  Name: Nathan [name] MRN: [id number] Date of Birth: 01/26/1941  Transition of Care [facility]) CM/SW Contact  Purcell Mouton, RN Phone Number: 06/01/2020, 5:18 PM  Clinical Narrative:     Spoke with Nathan [name] pt's son to go over [facility], which is completed. Nathan [name] very pleasant to this CM, is concerned about his father being discharged too early.  Will need insurance authorization and bed offers to give Nathan [name] in the AM.   Expected Discharge Plan: [place] Barriers to Discharge: No Barriers Identified  Expected Discharge Plan and Services Expected Discharge Plan: [place] arrangements for the past 2 months: [place]                                       Social Determinants of Health (SDOH) Interventions    Readmission Risk Interventions No flowsheet data found.

## 2020-06-01 NOTE — Care Management Important Message (Signed)
Important Message  Patient Details IM Letter placed in Patient's door [place]. Name: MONTRELLE EDDINGS MRN: [id number] Date of Birth: November 25, 1940   Medicare Important Message Given:  Yes     Kerin Salen 06/01/2020, 11:56 AM

## 2020-06-01 NOTE — TOC Progression Note (Addendum)
Transition of Care [facility]) - Progression Note    Patient Details  Name: Nathan [name] MRN: [id number] Date of Birth: 1940/08/29  [facility] of [facility]) CM/SW Contact  Purcell Mouton, RN Phone Number: 06/01/2020, 3:36 PM  Clinical Narrative:     Spoke with pt's sons Plumer asked this CM to call Derrick via phone concerning discharge plan for patient. Derrick asked For MD to call with updates. Will fax Skilled Nursing Facilities (SNF) and share bed offers with Juanda [facility] and [facility]. Pt will need insurance authorization/approval for SNF once bed is selected.       Expected Discharge Plan and Services                                                 Social Determinants of Health (SDOH) Interventions    Readmission Risk Interventions No flowsheet data found.

## 2020-06-01 NOTE — Progress Notes (Signed)
PROGRESS NOTE  Nathan [name] GEXBMW  DOB: October 29, 1940  PCP: Janith Lima, MD UXL:[id number]  DOA: 05/29/2020  LOS: 3 days   Chief Complaint  Patient presents with  . Altered Mental Status   Brief narrative: Nathan [name] is a 80 y.o. male with PMH significant for HTN, CKD 3B, chronic A. fib on Coumadin, left ventricle hypertrophy, thoracic aortic aneurysm, BPH, gout who lives at [facility]. Patient was brought to the ED on 2/11 for progressively worsening confusion, hypotension and hypoxia. Patient was recently hospitalized from 1/23-1/28 after a fall resulting in a fracture of the right humerus for which he underwent ORIF on 1/25 by Dr. Percell Miller. Of note, patient tested positive for Covid when checked prior to discharge to SNF. Per report, for last 2 to 3 days, patient has low oral intake, progressively worsening confusion and also shortness of breath. SNF staff noted his blood pressure to be low at 70/40 and hence EMS was called. EMS noted blood pressure low at 68/40, O2 sat was 80% on room air. He was given 500 cc of normal saline, placed on 4 L oxygen by nasal cannula and brought to the ED.  In the ED, patient was afebrile, tachypneic to 29. He initially was able to maintain saturation on 4 L oxygen but later required up to 12 L high flow by nasal cannula. Blood gas at that time showed a normal pH of 7.46, PCO2 low at 26.7 and a low PO2 at 56.6. Chest x-ray revealed bilateral patchy infiltrates consistent with Covid pneumonia.   Labs showed CRP elevated to 13.5, procalcitonin normal, WBC count normal, sodium level elevated 253, creatinine elevated 2.29, INR elevated to 8.8, with no evidence of bleeding. Patient received 1 mg of IV vitamin K, IV bolus or antibiotics. Admitted to hospitalist service  Subjective: Patient was seen and examined this morning. Propped up in bed.  Not in distress.  On 4 L oxygen by nasal cannula this morning. Labs showed improving  creatinine, INR is trending up for unclear reason  Assessment/Plan: COVID pneumonia Acute respiratory failure with hypoxia  -Presented with worsening shortness of breath, lethargy -COVID test: PCR positive on 1/27 -Chest imaging: Chest x-ray on admission showed new bilateral airspace lung opacities consistent with multifocal pneumonia  -Treatment: Currently on a 5-day course of IV remdesivir to complete on 2/15.  Also on Solu-Medrol IV 60 mg twice daily.  He was also started on baricitinib 1 mg daily on admission.  His oxygen requirement is improving.  On 4 L oxygen by nasal collar.  Improving.  Continue to wean down as tolerated. -Currently also on broad-spectrum antibiotics.  Procalcitonin level trending down. -Oxygen - SpO2: 96 % O2 Flow Rate (L/min): 3 L/min -Supportive care: Vitamin C, Zinc, PRN inhalers, Tylenol, Antitussives (benzonatate/ Mucinex/Tussionex).   -Encouraged incentive spirometry, prone position, out of bed and early mobilization as much as possible -Continue airborne/contact isolation precautions for duration of 3 weeks from the day of diagnosis. -WBC and inflammatory markers trend as below. Recent Labs  Lab 05/29/20 1734 05/29/20 1929 05/29/20 1954 05/30/20 0508 05/31/20 0500 05/31/20 0737 06/01/20 0350  WBC 8.7  --   --  6.0 7.9  --  7.7  LATICACIDVEN 1.6  2.5*  --  1.7  --   --   --   --   PROCALCITON  --  0.40  --   --  0.21  --  0.13  DDIMER  --  2.14*  --  1.93*  1.67*  --  1.15*  FERRITIN  --   --   --   --   --  348*  --   CRP  --  13.5*  --  12.2*  --  9.3* 4.9*  ALT 14  --   --  12 15  --  15   The treatment plan and use of medications and known side effects were discussed with patient/family. Some of the medications used are based on case reports/anecdotal data.  All other medications being used in the management of COVID-19 based on limited study data.  Complete risks and long-term side effects are unknown, however in the best clinical judgment they  seem to be of some benefit.  Patient wanted to proceed with treatment options provided.  Abnormal urinalysis -Urinalysis with hazy urine, leukocytes and bacteria probably because of dehydration.  AKI on CKD 3B -Creatinine was 1.89 on last discharge, presented with a creatinine elevated 2.29.  Improving with IV fluid, creatinine is down to 1.53 today.  Stop IV fluid. Recent Labs    05/10/20 0350 05/11/20 0521 05/12/20 0518 05/13/20 0906 05/14/20 0812 05/15/20 0318 05/29/20 1734 05/30/20 0508 05/31/20 0500 06/01/20 0350  BUN 45* 42* 49* 42* 49* 50* 72* 69* 75* 78*  CREATININE 2.01* 1.86* 2.03* 1.92* 1.96* 1.89* 2.29* 2.06* 1.74* 1.53*   Acute hypernatremia -Due to poor oral intake.   -Sodium level improved with half-normal saline.  Stop IV fluid today.  Encourage oral intake. Recent Labs  Lab 05/29/20 1734 05/30/20 0508 05/31/20 0500 06/01/20 0350  NA 153* 154* 147* 144   Lactic acidosis Non-anion gap metabolic acidosis -Probably secondary to decreased tissue perfusion from dehydration and also anaerobic respiration due to COVID pneumonia.  Acute metabolic encephalopathy Mild chronic cognitive dysfunction -Acute change in mental status because of dehydration, infection. -Mental status improving.  Continue to monitor.  Essential hypertension -Continue to monitor on Cardizem, metoprolol.   -On last hospitalization, lisinopril was stopped and Lasix dose was reduced.   -Pressure stable at this time.  Elevated INR -INR continues to remain elevated for unclear reason.  1 dose of vitamin K was given in the ED.  Currently not bleeding.  I would let INR drift down by itself. -On long-term anticoagulation with Coumadin for A. fib.  Supratherapeutic at this time as mentioned above. -During last hospitalization, I had a discussion with patient's son about continuation of Coumadin.  At that time, patient's son wanted to continue Coumadin and revisit the decision later.  At this  time, I would recommend not to resume Coumadin going forward. Recent Labs  Lab 05/29/20 1734 05/30/20 0508 06/01/20 0350  INR 8.8* 9.4* 10.1*   Afib -On metoprolol and Cardizem.  Since blood pressure is improved, okay to continue both. -Anticoagulation plan as above.  Acute on chronic anemia -Baseline hemoglobin above 10.  Hemoglobin trending down, 8.9 today.  No active bleeding. -Continue to monitor.   Recent Labs    05/15/20 0318 05/29/20 1734 05/30/20 0508 05/31/20 0500 05/31/20 0737 06/01/20 0350  HGB 10.0* 10.0* 8.9* 9.6*  --  8.9*  MCV 97.0 99.1 100.3* 99.4  --  95.4  VITAMINB12  --   --   --   --  492  --   FOLATE  --   --   --   --  20.0  --   FERRITIN  --   --   --   --  348*  --   TIBC  --   --   --   --  142*  --   IRON  --   --   --   --  51  --   RETICCTPCT  --   --   --  1.7  --   --    Recent comminuted fracture of right humerus -After mechanical fall. -1/25, underwent ORIF of right humerus by Dr. Percell Miller. -Continue pain control with oral oxycodone. -Orthopedics recommended NWBRUEto the shoulder, minimal WB to the right hand through the elbow.  Impaired mobility -Prior to last admission, patient was using a walker at home. -Discharged to SNF on last admission  Mild protein calorie malnutrition Hypoalbuminemia -Albumin level low at 1.9.  Secondary to poor appetite, poor p.o. intake -Nutrition consult  Gout -Continuing home regimen of allopurinol  Mobility: PT eval ordered Code Status:   Code Status: Full Code  Nutritional status: Body mass index is 28.32 kg/m. Nutrition Problem: Inadequate oral intake Etiology: poor appetite Signs/Symptoms: meal completion < 25%,per patient/family report Diet Order            Diet Heart Room service appropriate? Yes; Fluid consistency: Thin  Diet effective now                 DVT prophylaxis: SCDs Start: 05/29/20 2011   Antimicrobials:  Currently on IV Rocephin Fluid: Okay to stop IV  fluid Consultants: None Family Communication:  None at bedside  Status is: Inpatient  Remains inpatient appropriate because: On IV treatment for Covid pneumonia, dehydration, AKI.  Dispo: The patient is from: SNF              Anticipated d/c is to: SNF              Anticipated d/c date is: 3 days              Patient currently is not medically stable to d/c.   Difficult to place patient No  Infusions:  . sodium chloride 75 mL/hr at 05/31/20 0955  . remdesivir 100 mg in NS 100 mL 100 mg (06/01/20 0916)    Scheduled Meds: . allopurinol  100 mg Oral Daily  . amoxicillin  500 mg Oral Q8H  . vitamin C  500 mg Oral Daily  . baricitinib  2 mg Oral Daily  . diltiazem  120 mg Oral Daily  . finasteride  5 mg Oral Daily  . methylPREDNISolone (SOLU-MEDROL) injection  60 mg Intravenous Q12H  . metoprolol tartrate  50 mg Oral BID  . sodium chloride flush  10-40 mL Intracatheter Q12H  . zinc sulfate  220 mg Oral Daily    Antimicrobials: Anti-infectives (From admission, onward)   Start     Dose/Rate Route Frequency Ordered Stop   06/01/20 0900  amoxicillin (AMOXIL) capsule 500 mg        500 mg Oral Every 8 hours 06/01/20 0858     05/30/20 1000  remdesivir 100 mg in sodium chloride 0.9 % 100 mL IVPB       "Followed by" Linked Group Details   100 mg 200 mL/hr over 30 Minutes Intravenous Daily 05/29/20 2010 06/03/20 0959   05/30/20 0200  cefTRIAXone (ROCEPHIN) 1 g in sodium chloride 0.9 % 100 mL IVPB  Status:  Discontinued        1 g 200 mL/hr over 30 Minutes Intravenous Every 24 hours 05/29/20 2108 06/01/20 0857   05/29/20 2200  remdesivir 200 mg in sodium chloride 0.9% 250 mL IVPB       "Followed by" Linked Group Details  200 mg 580 mL/hr over 30 Minutes Intravenous Once 05/29/20 2010 05/29/20 2317   05/29/20 1845  vancomycin (VANCOREADY) IVPB 1750 mg/350 mL        1,750 mg 175 mL/hr over 120 Minutes Intravenous  Once 05/29/20 1835 05/29/20 2200   05/29/20 1830  vancomycin  (VANCOCIN) IVPB 1000 mg/200 mL premix  Status:  Discontinued        1,000 mg 200 mL/hr over 60 Minutes Intravenous  Once 05/29/20 1826 05/29/20 1835   05/29/20 1830  ceFEPIme (MAXIPIME) 2 g in sodium chloride 0.9 % 100 mL IVPB        2 g 200 mL/hr over 30 Minutes Intravenous  Once 05/29/20 1826 05/29/20 1959      PRN meds: acetaminophen, albuterol, diphenhydrAMINE-zinc acetate, guaiFENesin-dextromethorphan, melatonin, ondansetron **OR** ondansetron (ZOFRAN) IV, polyethylene glycol, sodium chloride flush   Objective: Vitals:   06/01/20 0352 06/01/20 1154  BP: (!) 152/87 113/77  Pulse: 64 67  Resp:  (!) 22  Temp: 98 F (36.7 C) 97.6 F (36.4 C)  SpO2: (!) 88% 96%    Intake/Output Summary (Last 24 hours) at 06/01/2020 1338 Last data filed at 06/01/2020 1100 Gross per 24 hour  Intake 480 ml  Output 1202 ml  Net -722 ml   Filed Weights   05/29/20 1802 05/30/20 0224 06/01/20 0352  Weight: 94.8 kg 83.7 kg 87 kg   Weight change:  Body mass index is 28.32 kg/m.   Physical Exam: General exam: Pleasant elderly Caucasian male.  Propped up in bed.  Not in physical distress Skin: No rashes, lesions or ulcers. HEENT: Atraumatic, normocephalic, no obvious bleeding Lungs: Improving aeration bilaterally.  No crackles or wheezing CVS: Regular rate and rhythm, no murmur GI/Abd soft, nontender, nondistended, bowel sound present CNS: Alert, awake, oriented to place and person, Psychiatry: Depressed look Extremities: No pedal edema, no calf tenderness  Data Review: I have personally reviewed the laboratory data and studies available.  Recent Labs  Lab 05/29/20 1734 05/30/20 0508 05/31/20 0500 06/01/20 0350  WBC 8.7 6.0 7.9 7.7  NEUTROABS 7.4 5.6 7.4 7.3  HGB 10.0* 8.9* 9.6* 8.9*  HCT 34.2* 30.4* 32.5* 28.8*  MCV 99.1 100.3* 99.4 95.4  PLT 363 268 329 355   Recent Labs  Lab 05/29/20 1734 05/30/20 0508 05/31/20 0500 06/01/20 0350  NA 153* 154* 147* 144  K 4.0 4.2 3.8 3.8   CL 118* 124* 117* 117*  CO2 21* 18* 17* 14*  GLUCOSE 115* 138* 125* 116*  BUN 72* 69* 75* 78*  CREATININE 2.29* 2.06* 1.74* 1.53*  CALCIUM 8.1* 7.8* 8.0* 7.5*  MG  --  2.0 2.2 2.2    F/u labs ordered Unresulted Labs (From admission, onward)          Start     Ordered   06/01/20 0500  Protime-INR  Daily,   R      05/31/20 1300   05/30/20 0500  CBC with Differential/Platelet  Daily,   R      05/29/20 2010   05/30/20 0500  Comprehensive metabolic panel  Daily,   R      05/29/20 2010   05/30/20 0500  C-reactive protein  Daily,   R      05/29/20 2010   05/30/20 0500  D-dimer, quantitative (not at [facility])  Daily,   R      05/29/20 2010   05/30/20 0500  Magnesium  Daily,   R      05/29/20 2010  Signed, [name], MD [facility] 06/01/2020

## 2020-06-02 DIAGNOSIS — U071 COVID-19: Secondary | ICD-10-CM | POA: Diagnosis not present

## 2020-06-02 DIAGNOSIS — J9601 Acute respiratory failure with hypoxia: Secondary | ICD-10-CM | POA: Diagnosis not present

## 2020-06-02 LAB — CBC WITH DIFFERENTIAL/PLATELET
Abs Immature Granulocytes: 0.08 10*3/uL — ABNORMAL HIGH (ref 0.00–0.07)
Basophils Absolute: 0 10*3/uL (ref 0.0–0.1)
Basophils Relative: 0 %
Eosinophils Absolute: 0 10*3/uL (ref 0.0–0.5)
Eosinophils Relative: 0 %
HCT: 33 % — ABNORMAL LOW (ref 39.0–52.0)
Hemoglobin: 10.1 g/dL — ABNORMAL LOW (ref 13.0–17.0)
Immature Granulocytes: 1 %
Lymphocytes Relative: 3 %
Lymphs Abs: 0.2 10*3/uL — ABNORMAL LOW (ref 0.7–4.0)
MCH: 28.9 pg (ref 26.0–34.0)
MCHC: 30.6 g/dL (ref 30.0–36.0)
MCV: 94.3 fL (ref 80.0–100.0)
Monocytes Absolute: 0.3 10*3/uL (ref 0.1–1.0)
Monocytes Relative: 3 %
Neutro Abs: 8 10*3/uL — ABNORMAL HIGH (ref 1.7–7.7)
Neutrophils Relative %: 93 %
Platelets: 344 10*3/uL (ref 150–400)
RBC: 3.5 MIL/uL — ABNORMAL LOW (ref 4.22–5.81)
RDW: 15.6 % — ABNORMAL HIGH (ref 11.5–15.5)
WBC: 8.5 10*3/uL (ref 4.0–10.5)
nRBC: 0 % (ref 0.0–0.2)

## 2020-06-02 LAB — C-REACTIVE PROTEIN: CRP: 3.2 mg/dL — ABNORMAL HIGH (ref <1.0)

## 2020-06-02 LAB — PROTIME-INR
INR: 7.9 (ref 0.8–1.2)
Prothrombin Time: 64.3 seconds — ABNORMAL HIGH (ref 11.4–15.2)

## 2020-06-02 LAB — COMPREHENSIVE METABOLIC PANEL
ALT: 20 U/L (ref 0–44)
AST: 20 U/L (ref 15–41)
Albumin: 2.2 g/dL — ABNORMAL LOW (ref 3.5–5.0)
Alkaline Phosphatase: 96 U/L (ref 38–126)
Anion gap: 12 (ref 5–15)
BUN: 82 mg/dL — ABNORMAL HIGH (ref 8–23)
CO2: 17 mmol/L — ABNORMAL LOW (ref 22–32)
Calcium: 7.8 mg/dL — ABNORMAL LOW (ref 8.9–10.3)
Chloride: 113 mmol/L — ABNORMAL HIGH (ref 98–111)
Creatinine, Ser: 1.54 mg/dL — ABNORMAL HIGH (ref 0.61–1.24)
GFR, Estimated: 45 mL/min — ABNORMAL LOW (ref >60)
Glucose, Bld: 112 mg/dL — ABNORMAL HIGH (ref 70–99)
Potassium: 4.5 mmol/L (ref 3.5–5.1)
Sodium: 142 mmol/L (ref 135–145)
Total Bilirubin: 0.8 mg/dL (ref 0.3–1.2)
Total Protein: 5.9 g/dL — ABNORMAL LOW (ref 6.5–8.1)

## 2020-06-02 LAB — MAGNESIUM: Magnesium: 2.3 mg/dL (ref 1.7–2.4)

## 2020-06-02 LAB — D-DIMER, QUANTITATIVE: D-Dimer, Quant: 0.91 ug/mL-FEU — ABNORMAL HIGH (ref 0.00–0.50)

## 2020-06-02 MED ORDER — METHYLPREDNISOLONE SODIUM SUCC 40 MG IJ SOLR
40.0000 mg | Freq: Two times a day (BID) | INTRAMUSCULAR | Status: DC
  Administered 2020-06-02 – 2020-06-10 (×16): 40 mg via INTRAVENOUS
  Filled 2020-06-02 (×16): qty 1

## 2020-06-02 NOTE — Plan of Care (Signed)
  Problem: Clinical Measurements: Goal: Diagnostic test results will improve Outcome: Progressing Goal: Cardiovascular complication will be avoided Outcome: Progressing   Problem: Nutrition: Goal: Adequate nutrition will be maintained Outcome: Progressing   Problem: Pain Managment: Goal: General experience of comfort will improve Outcome: Progressing   Problem: Safety: Goal: Ability to remain free from injury will improve Outcome: Progressing

## 2020-06-02 NOTE — Progress Notes (Signed)
Patient refused wound care. Started kicking and grabbing at [employer]. Attempted to educate, but pt is confused. A/O X1

## 2020-06-02 NOTE — Progress Notes (Signed)
MD notified that patient pulled midline on day shift. IV team consulted and were unsuccessful at gaining IV access.

## 2020-06-02 NOTE — Progress Notes (Signed)
PROGRESS NOTE  Nathan [name]  DOB: 02-28-1941  PCP: Janith Lima, MD WVP:[id number]  DOA: 05/29/2020  LOS: 4 days   Chief Complaint  Patient presents with  . Altered Mental Status   Brief narrative: Nathan [name] is a 80 y.o. male with PMH significant for HTN, CKD 3B, chronic A. fib on Coumadin, left ventricle hypertrophy, thoracic aortic aneurysm, BPH, gout who lives at [facility]. Patient was brought to the ED on 2/11 for progressively worsening confusion, hypotension and hypoxia. Patient was recently hospitalized from 1/23-1/28 after a fall resulting in a fracture of the right humerus for which he underwent ORIF on 1/25 by Dr. Percell Miller. Of note, patient tested positive for Covid when checked prior to discharge to SNF. Per report, for last 2 to 3 days, patient has low oral intake, progressively worsening confusion and also shortness of breath. SNF staff noted his blood pressure to be low at 70/40 and hence EMS was called. EMS noted blood pressure low at 68/40, O2 sat was 80% on room air. He was given 500 cc of normal saline, placed on 4 L oxygen by nasal cannula and brought to the ED.  In the ED, patient was afebrile, tachypneic to 29. He initially was able to maintain saturation on 4 L oxygen but later required up to 12 L high flow by nasal cannula. Blood gas at that time showed a normal pH of 7.46, PCO2 low at 26.7 and a low PO2 at 56.6. Chest x-ray revealed bilateral patchy infiltrates consistent with Covid pneumonia.   Labs showed CRP elevated to 13.5, procalcitonin normal, WBC count normal, sodium level elevated 253, creatinine elevated 2.29, INR elevated to 8.8, with no evidence of bleeding. Patient received 1 mg of IV vitamin K, IV bolus or antibiotics. Admitted to hospitalist service  Subjective: Patient was seen and examined this morning. Propped up in bed.  Not in distress.  On 4 L oxygen by nasal cannula.  Feels weak.    Assessment/Plan: COVID pneumonia Acute respiratory failure with hypoxia  -Presented with worsening shortness of breath, lethargy -COVID test: PCR positive on 1/27 -Chest imaging: Chest x-ray on admission showed new bilateral airspace lung opacities consistent with multifocal pneumonia  -Treatment: Patient completed 5-day course of IV remdesivir today.  Currently remains on Solu-Medrol IV 60 mg twice daily.  Will reduce it to 40 mg twice daily.  He is also on baricitinib 1 mg daily on admission.  His oxygen requirement remains at 4 L oxygen by nasal cannula.  Continue to wean down as tolerated. -Currently also on broad-spectrum antibiotics.  Procalcitonin level trending down. -Oxygen - SpO2: 91 % O2 Flow Rate (L/min): 4 L/min -Supportive care: Vitamin C, Zinc, PRN inhalers, Tylenol, Antitussives (benzonatate/ Mucinex/Tussionex).   -Encouraged incentive spirometry, prone position, out of bed and early mobilization as much as possible -Continue airborne/contact isolation precautions for duration of 3 weeks from the day of diagnosis. -WBC and inflammatory markers trend as below. Recent Labs  Lab 05/29/20 1734 05/29/20 1929 05/29/20 1954 05/30/20 0508 05/31/20 0500 05/31/20 0737 06/01/20 0350 06/02/20 0422  WBC 8.7  --   --  6.0 7.9  --  7.7 8.5  LATICACIDVEN 1.6  2.5*  --  1.7  --   --   --   --   --   PROCALCITON  --  0.40  --   --  0.21  --  0.13  --   DDIMER  --  2.14*  --  1.93* 1.67*  --  1.15* 0.91*  FERRITIN  --   --   --   --   --  348*  --   --   CRP  --  13.5*  --  12.2*  --  9.3* 4.9* 3.2*  ALT 14  --   --  12 15  --  15 20   The treatment plan and use of medications and known side effects were discussed with patient/family. Some of the medications used are based on case reports/anecdotal data.  All other medications being used in the management of COVID-19 based on limited study data.  Complete risks and long-term side effects are unknown, however in the best clinical  judgment they seem to be of some benefit.  Patient wanted to proceed with treatment options provided.  Abnormal urinalysis -Urinalysis with hazy urine, leukocytes and bacteria probably because of dehydration.  AKI on CKD 3B -Creatinine was 1.89 on last discharge, presented with a creatinine elevated 2.29.  Improving with IV fluid, creatinine is down to 1.54 today.  Encourage oral hydration Recent Labs    05/11/20 0521 05/12/20 0518 05/13/20 0906 05/14/20 0812 05/15/20 0318 05/29/20 1734 05/30/20 0508 05/31/20 0500 06/01/20 0350 06/02/20 0422  BUN 42* 49* 42* 49* 50* 72* 69* 75* 78* 82*  CREATININE 1.86* 2.03* 1.92* 1.96* 1.89* 2.29* 2.06* 1.74* 1.53* 1.54*   Acute hypernatremia -Due to poor oral intake.   -Sodium level improved with half-normal saline.  Stop IV fluid today.  Encourage oral intake. Recent Labs  Lab 05/29/20 1734 05/30/20 0508 05/31/20 0500 06/01/20 0350 06/02/20 0422  NA 153* 154* 147* 144 142   Lactic acidosis Non-anion gap metabolic acidosis -Probably secondary to decreased tissue perfusion from dehydration and also anaerobic respiration due to COVID pneumonia.  Acute metabolic encephalopathy Mild chronic cognitive dysfunction -Acute change in mental status because of dehydration, infection. -Mental status improving.  Continue to monitor.  Essential hypertension -Continue to monitor on Cardizem, metoprolol.   -On last hospitalization, lisinopril was stopped and Lasix was reduced.   -Pressure stable at this time.  Elevated INR -INR continues to remain elevated for unclear reason. 1 dose of vitamin K was given in the ED.  Currently not bleeding. I would let INR drift down by itself.  Improving down to 7.9 today. -On long-term anticoagulation with Coumadin for A. fib.  Supratherapeutic at this time as mentioned above. -I discussed this with patient's son Mr. Montine Circle on 2/14.  We will stop Coumadin going forward. Recent Labs  Lab 05/29/20 1734  05/30/20 0508 06/01/20 0350 06/02/20 0422  INR 8.8* 9.4* 10.1* 7.9*   Afib -On metoprolol and Cardizem.  Since blood pressure is improved, okay to continue both. -Anticoagulation plan as above.  Acute on chronic anemia -Baseline hemoglobin above 10.  No active bleeding.  Hemoglobin remains at baseline. -Continue to monitor.   Recent Labs    05/29/20 1734 05/30/20 0508 05/31/20 0500 05/31/20 0737 06/01/20 0350 06/02/20 0422  HGB 10.0* 8.9* 9.6*  --  8.9* 10.1*  MCV 99.1 100.3* 99.4  --  95.4 94.3  VITAMINB12  --   --   --  492  --   --   FOLATE  --   --   --  20.0  --   --   FERRITIN  --   --   --  348*  --   --   TIBC  --   --   --  142*  --   --  IRON  --   --   --  51  --   --   RETICCTPCT  --   --  1.7  --   --   --    Recent comminuted fracture of right humerus -After mechanical fall. -1/25, underwent ORIF of right humerus by Dr. Percell Miller. -Continue pain control with oral oxycodone. -Orthopedics recommended NWBRUEto the shoulder, minimal WB to the right hand through the elbow.  Impaired mobility -Prior to last admission, patient was using a walker at home. -Discharged to SNF on last admission.  Seen by PT.  Limited mobility because of walker dependence but inability to bear weight on the right upper extremity due to fracture  Mild protein calorie malnutrition Hypoalbuminemia -Albumin level low at 1.9.  Secondary to poor appetite, poor p.o. intake -Nutrition consult  Gout -Continuing home regimen of allopurinol  Mobility: PT eval ordered Code Status:   Code Status: Full Code  Nutritional status: Body mass index is 28.32 kg/m. Nutrition Problem: Inadequate oral intake Etiology: poor appetite Signs/Symptoms: meal completion < 25%,per patient/family report Diet Order            Diet Heart Room service appropriate? Yes; Fluid consistency: Thin  Diet effective now                 DVT prophylaxis: SCDs Start: 05/29/20 2011   Antimicrobials:  On  amoxicillin Fluid: Not on IV fluid Consultants: None Family Communication:  None at bedside  Status is: Inpatient  Remains inpatient appropriate because: On treatment for Covid pneumonia  Dispo: The patient is from: SNF              Anticipated d/c is to: SNF              Anticipated d/c date is: 1 to 2 days              Patient currently is not medically stable to d/c.   Difficult to place patient No  Infusions:    Scheduled Meds: . allopurinol  100 mg Oral Daily  . amoxicillin  500 mg Oral Q8H  . vitamin C  500 mg Oral Daily  . baricitinib  2 mg Oral Daily  . diltiazem  120 mg Oral Daily  . finasteride  5 mg Oral Daily  . methylPREDNISolone (SOLU-MEDROL) injection  60 mg Intravenous Q12H  . metoprolol tartrate  50 mg Oral BID  . sodium chloride flush  10-40 mL Intracatheter Q12H  . zinc sulfate  220 mg Oral Daily    Antimicrobials: Anti-infectives (From admission, onward)   Start     Dose/Rate Route Frequency Ordered Stop   06/01/20 0900  amoxicillin (AMOXIL) capsule 500 mg        500 mg Oral Every 8 hours 06/01/20 0858     05/30/20 1000  remdesivir 100 mg in sodium chloride 0.9 % 100 mL IVPB       "Followed by" Linked Group Details   100 mg 200 mL/hr over 30 Minutes Intravenous Daily 05/29/20 2010 06/02/20 1151   05/30/20 0200  cefTRIAXone (ROCEPHIN) 1 g in sodium chloride 0.9 % 100 mL IVPB  Status:  Discontinued        1 g 200 mL/hr over 30 Minutes Intravenous Every 24 hours 05/29/20 2108 06/01/20 0857   05/29/20 2200  remdesivir 200 mg in sodium chloride 0.9% 250 mL IVPB       "Followed by" Linked Group Details   200 mg 580 mL/hr over 30 Minutes  Intravenous Once 05/29/20 2010 05/29/20 2317   05/29/20 1845  vancomycin (VANCOREADY) IVPB 1750 mg/350 mL        1,750 mg 175 mL/hr over 120 Minutes Intravenous  Once 05/29/20 1835 05/29/20 2200   05/29/20 1830  vancomycin (VANCOCIN) IVPB 1000 mg/200 mL premix  Status:  Discontinued        1,000 mg 200 mL/hr over 60  Minutes Intravenous  Once 05/29/20 1826 05/29/20 1835   05/29/20 1830  ceFEPIme (MAXIPIME) 2 g in sodium chloride 0.9 % 100 mL IVPB        2 g 200 mL/hr over 30 Minutes Intravenous  Once 05/29/20 1826 05/29/20 1959      PRN meds: acetaminophen, albuterol, diphenhydrAMINE-zinc acetate, guaiFENesin-dextromethorphan, loperamide, melatonin, ondansetron **OR** ondansetron (ZOFRAN) IV, sodium chloride flush   Objective: Vitals:   06/02/20 1123 06/02/20 1248  BP:  (!) 147/102  Pulse:  63  Resp:  (!) 23  Temp:  98.1 F (36.7 C)  SpO2: 95% 91%    Intake/Output Summary (Last 24 hours) at 06/02/2020 1335 Last data filed at 06/02/2020 0920 Gross per 24 hour  Intake 720 ml  Output 1150 ml  Net -430 ml   Filed Weights   05/29/20 1802 05/30/20 0224 06/01/20 0352  Weight: 94.8 kg 83.7 kg 87 kg   Weight change:  Body mass index is 28.32 kg/m.   Physical Exam: General exam: Pleasant elderly Caucasian male.  Propped up in bed.  Not in physical distress Skin: No rashes, lesions or ulcers. HEENT: Atraumatic, normocephalic, no obvious bleeding Lungs: Improving aeration bilaterally.  No crackles or wheezing CVS: Regular rate and rhythm, no murmur GI/Abd soft, nontender, nondistended, bowel sound present CNS: Alert, awake, oriented to place and person, Psychiatry: Depressed look Extremities: No pedal edema, no calf tenderness  Data Review: I have personally reviewed the laboratory data and studies available.  Recent Labs  Lab 05/29/20 1734 05/30/20 0508 05/31/20 0500 06/01/20 0350 06/02/20 0422  WBC 8.7 6.0 7.9 7.7 8.5  NEUTROABS 7.4 5.6 7.4 7.3 8.0*  HGB 10.0* 8.9* 9.6* 8.9* 10.1*  HCT 34.2* 30.4* 32.5* 28.8* 33.0*  MCV 99.1 100.3* 99.4 95.4 94.3  PLT 363 268 329 355 344   Recent Labs  Lab 05/29/20 1734 05/30/20 0508 05/31/20 0500 06/01/20 0350 06/02/20 0422  NA 153* 154* 147* 144 142  K 4.0 4.2 3.8 3.8 4.5  CL 118* 124* 117* 117* 113*  CO2 21* 18* 17* 14* 17*   GLUCOSE 115* 138* 125* 116* 112*  BUN 72* 69* 75* 78* 82*  CREATININE 2.29* 2.06* 1.74* 1.53* 1.54*  CALCIUM 8.1* 7.8* 8.0* 7.5* 7.8*  MG  --  2.0 2.2 2.2 2.3    F/u labs ordered Unresulted Labs (From admission, onward)          Start     Ordered   06/01/20 0500  Protime-INR  Daily,   R      05/31/20 1300   05/30/20 0500  CBC with Differential/Platelet  Daily,   R      05/29/20 2010   05/30/20 0500  Comprehensive metabolic panel  Daily,   R      05/29/20 2010   05/30/20 0500  C-reactive protein  Daily,   R      05/29/20 2010   05/30/20 0500  D-dimer, quantitative (not at [facility])  Daily,   R      05/29/20 2010   05/30/20 0500  Magnesium  Daily,   R  05/29/20 2010         Signed, [name], MD [facility] 06/02/2020

## 2020-06-02 NOTE — Progress Notes (Signed)
CRITICAL VALUE ALERT  Critical Value:  INR 7.9  Date & Time Notied:  06/02/20 @0645   Provider Notified: [place]  Orders Received/Actions taken: none at this time

## 2020-06-02 NOTE — TOC Progression Note (Signed)
Transition of Care [facility]) - Progression Note    Patient Details  Name: Nathan [name] MRN: [id number] Date of Birth: 10/04/1940  [facility]) CM/SW Contact  Purcell Mouton, RN Phone Number: 06/02/2020, 2:06 PM  Clinical Narrative:    Damaris Schooner with Montine Circle this am concerning bed offers, and that [facility] admission coordinator Juliann Pulse, asked that Montine Circle call her.   Pt's RN revealed that all calls should go to [facility] and not [employer] Spoke with pt's son Dia who asked that his wife Hilliard Clark [phone] be called concerning pt and his discharge plan.  A call was made to [place], no answer, left VM for her to return call.   Expected Discharge Plan: [place] Barriers to Discharge: No Barriers Identified  Expected Discharge Plan and Services Expected Discharge Plan: [place] arrangements for the past 2 months: [place]                                       Social Determinants of Health (SDOH) Interventions    Readmission Risk Interventions No flowsheet data found.

## 2020-06-02 NOTE — Progress Notes (Signed)
Patient arrived on unit via bed. Is on O2 4L HFNC. Will continue to monitor the patient.

## 2020-06-02 NOTE — Plan of Care (Signed)
Patient max 2 assist to stand and pivot to chair.  Patient spoke with son Hridaan and significant other Stanton Kidney during this shift.  Remains on 4 liters oxygen.

## 2020-06-03 ENCOUNTER — Inpatient Hospital Stay (HOSPITAL_COMMUNITY): Payer: Medicare Other

## 2020-06-03 DIAGNOSIS — U071 COVID-19: Secondary | ICD-10-CM | POA: Diagnosis not present

## 2020-06-03 DIAGNOSIS — J9601 Acute respiratory failure with hypoxia: Secondary | ICD-10-CM | POA: Diagnosis not present

## 2020-06-03 LAB — COMPREHENSIVE METABOLIC PANEL
ALT: 20 U/L (ref 0–44)
AST: 18 U/L (ref 15–41)
Albumin: 2.3 g/dL — ABNORMAL LOW (ref 3.5–5.0)
Alkaline Phosphatase: 107 U/L (ref 38–126)
Anion gap: 11 (ref 5–15)
BUN: 83 mg/dL — ABNORMAL HIGH (ref 8–23)
CO2: 16 mmol/L — ABNORMAL LOW (ref 22–32)
Calcium: 7.6 mg/dL — ABNORMAL LOW (ref 8.9–10.3)
Chloride: 118 mmol/L — ABNORMAL HIGH (ref 98–111)
Creatinine, Ser: 1.42 mg/dL — ABNORMAL HIGH (ref 0.61–1.24)
GFR, Estimated: 49 mL/min — ABNORMAL LOW (ref >60)
Glucose, Bld: 103 mg/dL — ABNORMAL HIGH (ref 70–99)
Potassium: 4.3 mmol/L (ref 3.5–5.1)
Sodium: 145 mmol/L (ref 135–145)
Total Bilirubin: 0.9 mg/dL (ref 0.3–1.2)
Total Protein: 5.8 g/dL — ABNORMAL LOW (ref 6.5–8.1)

## 2020-06-03 LAB — CULTURE, BLOOD (ROUTINE X 2)
Culture: NO GROWTH
Culture: NO GROWTH
Special Requests: ADEQUATE

## 2020-06-03 LAB — CBC WITH DIFFERENTIAL/PLATELET
Abs Immature Granulocytes: 0.06 10*3/uL (ref 0.00–0.07)
Basophils Absolute: 0 10*3/uL (ref 0.0–0.1)
Basophils Relative: 0 %
Eosinophils Absolute: 0 10*3/uL (ref 0.0–0.5)
Eosinophils Relative: 0 %
HCT: 31 % — ABNORMAL LOW (ref 39.0–52.0)
Hemoglobin: 9.1 g/dL — ABNORMAL LOW (ref 13.0–17.0)
Immature Granulocytes: 1 %
Lymphocytes Relative: 3 %
Lymphs Abs: 0.2 10*3/uL — ABNORMAL LOW (ref 0.7–4.0)
MCH: 28.9 pg (ref 26.0–34.0)
MCHC: 29.4 g/dL — ABNORMAL LOW (ref 30.0–36.0)
MCV: 98.4 fL (ref 80.0–100.0)
Monocytes Absolute: 0.2 10*3/uL (ref 0.1–1.0)
Monocytes Relative: 3 %
Neutro Abs: 6.8 10*3/uL (ref 1.7–7.7)
Neutrophils Relative %: 93 %
Platelets: 279 10*3/uL (ref 150–400)
RBC: 3.15 MIL/uL — ABNORMAL LOW (ref 4.22–5.81)
RDW: 15.8 % — ABNORMAL HIGH (ref 11.5–15.5)
WBC: 7.3 10*3/uL (ref 4.0–10.5)
nRBC: 0 % (ref 0.0–0.2)

## 2020-06-03 LAB — C-REACTIVE PROTEIN: CRP: 2.3 mg/dL — ABNORMAL HIGH (ref <1.0)

## 2020-06-03 LAB — D-DIMER, QUANTITATIVE: D-Dimer, Quant: 0.85 ug/mL-FEU — ABNORMAL HIGH (ref 0.00–0.50)

## 2020-06-03 LAB — PROTIME-INR
INR: 6.2 (ref 0.8–1.2)
Prothrombin Time: 53.3 seconds — ABNORMAL HIGH (ref 11.4–15.2)

## 2020-06-03 LAB — MAGNESIUM: Magnesium: 2.4 mg/dL (ref 1.7–2.4)

## 2020-06-03 NOTE — Progress Notes (Signed)
Physical Therapy Treatment Patient Details Name: Nathan [name] MRN: [id number] DOB: 09-Apr-1941 Today's Date: 06/03/2020    History of Present Illness Nathan [name] is a 80 y.o. male with PMH significant for history of atrial fibrillation on anticoagulation, hypertension and R humerus fx s/p ORIF 05/12/20.  Pt with positive COVID test at time of dc to rehab and now readmitted 2* increased confusion, hypotension and SOB.    PT Comments    Total assist for supine to sit, pt had heavy posterior and R lateral lean in sitting requiring max assist, SaO2 80% on 4L O2 in sitting, 3/4 dyspnea. Performed supine LE exercises. Mechanical lift recommended for transfers. Sent text to hospitalist requesting clarification from ortho on whether or not pt needs sling for RUE and whether or not ROM to R shoulder is permitted.    Follow Up Recommendations  SNF;Home health PT (per chart, family planning DC home)     Equipment Recommendations  Wheelchair cushion (measurements PT);Wheelchair (measurements PT);Hospital bed ; [facility] lift   Recommendations for Other Services       Precautions / Restrictions Precautions Precautions: Shoulder Type of Shoulder Precautions: No AROM, No PROM Shoulder Interventions: Shoulder sling/immobilizer;At all times;Off for dressing/bathing/exercises Precaution Comments: per prior admission 05/12/20, now awaiting clarification from ortho Restrictions Other Position/Activity Restrictions: may bear 5 lbs to elbow.  Restrictions based on notes from 05/15/20 when pt dc to SNF.    Mobility  Bed Mobility Overal bed mobility: Needs Assistance Bed Mobility: Supine to Sit;Sit to Supine     Supine to sit: Total assist Sit to supine: Total assist   General bed mobility comments: assist to raise trunk, heavy posterior lean in sitting and SaO2 80% on 4L O2 sitting with 3/4 dyspnea so returned to supine, no +2 assist available this session but definitely would be beneficial to  prevent lifting injury    Transfers                 General transfer comment: NT  Ambulation/Gait                 Stairs             Wheelchair Mobility    Modified Rankin (Stroke Patients Only)       Balance Overall balance assessment: Needs assistance Sitting-balance support: Single extremity supported;Feet supported Sitting balance-Leahy Scale: Zero Sitting balance - Comments: mod/max assist static sitting Postural control: Posterior lean;Right lateral lean                                  Cognition Arousal/Alertness: Awake/alert Behavior During Therapy: WFL for tasks assessed/performed Overall Cognitive Status: No family/caregiver present to determine baseline cognitive functioning                                 General Comments: mostly appropriate but confused, oriented to self only.  Confusion vs HOH      Exercises  Heel slides x 10 B AAROM    General Comments        Pertinent Vitals/Pain Faces Pain Scale: No hurt    Home Living                      Prior Function            PT Goals (current goals can now be found in  the care plan section) Acute Rehab PT Goals PT Goal Formulation: Patient unable to participate in goal setting Time For Goal Achievement: 06/14/20 Potential to Achieve Goals: Fair Progress towards PT goals: Progressing toward goals    Frequency    Min 3X/week      PT Plan Current plan remains appropriate    Co-evaluation              AM-PAC PT "6 Clicks" Mobility   Outcome Measure  Help needed turning from your back to your side while in a flat bed without using bedrails?: Total Help needed moving from lying on your back to sitting on the side of a flat bed without using bedrails?: Total Help needed moving to and from a bed to a chair (including a wheelchair)?: Total Help needed standing up from a chair using your arms (e.g., wheelchair or bedside chair)?:  Total Help needed to walk in hospital room?: Total Help needed climbing 3-5 steps with a railing? : Total 6 Click Score: 6    End of Session   Activity Tolerance: Patient tolerated treatment well Patient left: in bed;with call bell/phone within reach;with bed alarm set Nurse Communication: Mobility status;Need for lift equipment PT Visit Diagnosis: Unsteadiness on feet (R26.81);Other abnormalities of gait and mobility (R26.89);Muscle weakness (generalized) (M62.81)     Time: [id number] PT Time Calculation (min) (ACUTE ONLY): 22 min  Charges:  $Therapeutic Activity: 8-22 mins                     Blondell Reveal Kistler PT 06/03/2020  Acute Rehabilitation Services Pager [phone] Office [phone]

## 2020-06-03 NOTE — Progress Notes (Addendum)
PROGRESS NOTE  Nathan [name] [id number]  DOB: 01/20/1941  PCP: Janith Lima, MD QQP:[id number]  DOA: 05/29/2020  LOS: 5 days   Chief Complaint  Patient presents with  . Altered Mental Status   Brief narrative: Nathan [name] is a 80 y.o. male with PMH significant for HTN, CKD 3B, chronic A. fib on Coumadin, left ventricle hypertrophy, thoracic aortic aneurysm, BPH, gout who lives at [facility] skilled nursing facility. Patient was brought to the ED on 2/11 for progressively worsening confusion, hypotension and hypoxia. Patient was recently hospitalized from 1/23-1/28 after a fall resulting in a fracture of the right humerus for which he underwent ORIF on 1/25 by Dr. Percell Miller. Of note, patient tested positive for Covid when checked prior to discharge to SNF. Per report, for last 2 to 3 days, patient has low oral intake, progressively worsening confusion and also shortness of breath. SNF staff noted his blood pressure to be low at 70/40 and hence EMS was called. EMS noted blood pressure low at 68/40, O2 sat was 80% on room air. He was given 500 cc of normal saline, placed on 4 L oxygen by nasal cannula and brought to the ED.  In the ED, patient was afebrile, tachypneic to 29. He initially was able to maintain saturation on 4 L oxygen but later required up to 12 L high flow by nasal cannula. Blood gas at that time showed a normal pH of 7.46, PCO2 low at 26.7 and a low PO2 at 56.6. Chest x-ray revealed bilateral patchy infiltrates consistent with Covid pneumonia.   Labs showed CRP elevated to 13.5, procalcitonin normal, WBC count normal, sodium level elevated 253, creatinine elevated 2.29, INR elevated to 8.8, with no evidence of bleeding. Patient received 1 mg of IV vitamin K, IV bolus or antibiotics. Admitted to hospitalist service  Subjective: Patient was seen and examined this morning. Propped up in bed.  Not in distress.  On 4 L oxygen by nasal cannula.  Blood pressure in  130s.  Assessment/Plan: COVID pneumonia Acute respiratory failure with hypoxia   -Presented with worsening shortness of breath, lethargy -COVID test: PCR positive on 1/27 -Chest imaging: Chest x-ray on admission showed new bilateral airspace lung opacities consistent with multifocal pneumonia  -Treatment: Patient completed 5-day course of IV remdesivir on 2/15.  Currently on IV steroids tapering course, 40 mg IV twice daily at this time. He is also on baricitinib 1 mg daily on admission.  His oxygen requirement remains stable at 4 L oxygen by nasal cannula.  Continue to wean down as tolerated.  CRP improving. -Because of elevated procalcitonin, patient was also given presumptive antibiotics. Okay to stop it today.  Procalcitonin level trending down. -Oxygen - SpO2: 93 % O2 Flow Rate (L/min): 4 L/min -Supportive care: Vitamin C, Zinc, PRN inhalers, Tylenol, Antitussives (benzonatate/ Mucinex/Tussionex).   -Encouraged incentive spirometry, prone position, out of bed and early mobilization as much as possible -Continue airborne/contact isolation precautions for duration of 3 weeks from the day of diagnosis. -WBC and inflammatory markers trend as below. Recent Labs  Lab 05/29/20 1734 05/29/20 1734 05/29/20 1929 05/29/20 1954 05/30/20 0508 05/31/20 0500 05/31/20 0737 06/01/20 0350 06/02/20 0422 06/03/20 0349  WBC 8.7  --   --   --  6.0 7.9  --  7.7 8.5 7.3  LATICACIDVEN 1.6  2.5*  --   --  1.7  --   --   --   --   --   --   PROCALCITON  --   --  0.40  --   --  0.21  --  0.13  --   --   DDIMER  --    < > 2.14*  --  1.93* 1.67*  --  1.15* 0.91* 0.85*  FERRITIN  --   --   --   --   --   --  348*  --   --   --   CRP  --    < > 13.5*  --  12.2*  --  9.3* 4.9* 3.2* 2.3*  ALT 14  --   --   --  12 15  --  15 20 20    < > = values in this interval not displayed.   The treatment plan and use of medications and known side effects were discussed with patient/family. Some of the medications used  are based on case reports/anecdotal data.  All other medications being used in the management of COVID-19 based on limited study data.  Complete risks and long-term side effects are unknown, however in the best clinical judgment they seem to be of some benefit.  Patient wanted to proceed with treatment options provided.  Abnormal urinalysis -Urinalysis with hazy urine, leukocytes and bacteria probably because of dehydration.  AKI on CKD 3B -Creatinine was 1.89 on last discharge, presented with a creatinine elevated 2.29.  Improving with IV fluid, creatinine is down to 1.42 today.  Encourage oral hydration Recent Labs    05/12/20 0518 05/13/20 0906 05/14/20 0812 05/15/20 0318 05/29/20 1734 05/30/20 0508 05/31/20 0500 06/01/20 0350 06/02/20 0422 06/03/20 0349  BUN 49* 42* 49* 50* 72* 69* 75* 78* 82* 83*  CREATININE 2.03* 1.92* 1.96* 1.89* 2.29* 2.06* 1.74* 1.53* 1.54* 1.42*   Acute hypernatremia -Due to poor oral intake.   -Sodium level improved with half-normal saline. Encourage oral intake.  Currently not on IV fluid Recent Labs  Lab 05/29/20 1734 05/30/20 0508 05/31/20 0500 06/01/20 0350 06/02/20 0422 06/03/20 0349  NA 153* 154* 147* 144 142 145   Lactic acidosis Non-anion gap metabolic acidosis -Probably secondary to decreased tissue perfusion from dehydration and also anaerobic respiration due to COVID pneumonia.  Acute metabolic encephalopathy Mild chronic cognitive dysfunction -Acute change in mental status because of dehydration, infection. -Patient also has mild chronic cognitive dysfunction.  Continue to monitor  Essential hypertension -Continue to monitor on Cardizem, metoprolol.   -On last hospitalization, lisinopril was stopped and Lasix was reduced.   -Blood pressure stable at this time.  Elevated INR -INR was elevated for unclear reason.  Gradually improving, 6.2 today.   -For several years, patient was on long-term anticoagulation with Coumadin for A.  fib.   -Patient is at significant risk of fall and facial bleeding.  I would not resume Coumadin going forward. D/w patient's son. Recent Labs  Lab 05/29/20 1734 05/30/20 0508 06/01/20 0350 06/02/20 0422 06/03/20 0349  INR 8.8* 9.4* 10.1* 7.9* 6.2*   Afib -On metoprolol and Cardizem.  Since blood pressure is improved, okay to continue both. -Anticoagulation plan as above.  Acute on chronic anemia -Baseline hemoglobin above 10.  No active bleeding.  Hemoglobin remains at baseline. -Continue to monitor.   Recent Labs    05/30/20 0508 05/31/20 0500 05/31/20 0737 06/01/20 0350 06/02/20 0422 06/03/20 0349  HGB 8.9* 9.6*  --  8.9* 10.1* 9.1*  MCV 100.3* 99.4  --  95.4 94.3 98.4  VITAMINB12  --   --  492  --   --   --   FOLATE  --   --  20.0  --   --   --   FERRITIN  --   --  348*  --   --   --   TIBC  --   --  142*  --   --   --   IRON  --   --  51  --   --   --   RETICCTPCT  --  1.7  --   --   --   --    Recent comminuted fracture of right humerus -After mechanical fall. -1/25, underwent ORIF of right humerus by Dr. Percell Miller. -Continue pain control with oral oxycodone. -Orthopedics recommended NWBRUEto the shoulder, minimal WB to the right hand through the elbow. -Pending orthopedic recommendation.  Impaired mobility -Prior to last admission, patient was using a walker at home. -Discharged to SNF on last admission.  Seen by PT.  Limited mobility because of walker dependence but inability to bear weight on the right upper extremity due to fracture  Mild protein calorie malnutrition Hypoalbuminemia -Albumin level low at 1.9.  Secondary to poor appetite, poor p.o. intake -Nutrition consult  Gout -Continuing home regimen of allopurinol  Mobility: PT eval ordered Code Status:   Code Status: Full Code  Nutritional status: Body mass index is 28.32 kg/m. Nutrition Problem: Inadequate oral intake Etiology: poor appetite Signs/Symptoms: meal completion < 25%,per  patient/family report Diet Order            Diet Heart Room service appropriate? Yes; Fluid consistency: Thin  Diet effective now                 DVT prophylaxis: SCDs Start: 05/29/20 2011   Antimicrobials:  Antibiotics stopped today. Fluid: Not on IV fluid Consultants: None Family Communication:  Called and updated patient's son Mr. Chuck  Status is: Inpatient  Remains inpatient appropriate because: On treatment for Covid pneumonia  Dispo: The patient is from: SNF              Anticipated d/c is to: Family wants to take home with DMEs, hospital bed              Anticipated d/c date is: 1 to 2 days              Patient currently is not medically stable to d/c.   Difficult to place patient No  Infusions:    Scheduled Meds: . allopurinol  100 mg Oral Daily  . vitamin C  500 mg Oral Daily  . baricitinib  2 mg Oral Daily  . diltiazem  120 mg Oral Daily  . finasteride  5 mg Oral Daily  . methylPREDNISolone (SOLU-MEDROL) injection  40 mg Intravenous Q12H  . metoprolol tartrate  50 mg Oral BID  . sodium chloride flush  10-40 mL Intracatheter Q12H  . zinc sulfate  220 mg Oral Daily    Antimicrobials: Anti-infectives (From admission, onward)   Start     Dose/Rate Route Frequency Ordered Stop   06/01/20 0900  amoxicillin (AMOXIL) capsule 500 mg  Status:  Discontinued        500 mg Oral Every 8 hours 06/01/20 0858 06/03/20 1544   05/30/20 1000  remdesivir 100 mg in sodium chloride 0.9 % 100 mL IVPB       "Followed by" Linked Group Details   100 mg 200 mL/hr over 30 Minutes Intravenous Daily 05/29/20 2010 06/02/20 1151   05/30/20 0200  cefTRIAXone (ROCEPHIN) 1 g in sodium chloride 0.9 % 100 mL IVPB  Status:  Discontinued        1 g 200 mL/hr over 30 Minutes Intravenous Every 24 hours 05/29/20 2108 06/01/20 0857   05/29/20 2200  remdesivir 200 mg in sodium chloride 0.9% 250 mL IVPB       "Followed by" Linked Group Details   200 mg 580 mL/hr over 30 Minutes Intravenous Once  05/29/20 2010 05/29/20 2317   05/29/20 1845  vancomycin (VANCOREADY) IVPB 1750 mg/350 mL        1,750 mg 175 mL/hr over 120 Minutes Intravenous  Once 05/29/20 1835 05/29/20 2200   05/29/20 1830  vancomycin (VANCOCIN) IVPB 1000 mg/200 mL premix  Status:  Discontinued        1,000 mg 200 mL/hr over 60 Minutes Intravenous  Once 05/29/20 1826 05/29/20 1835   05/29/20 1830  ceFEPIme (MAXIPIME) 2 g in sodium chloride 0.9 % 100 mL IVPB        2 g 200 mL/hr over 30 Minutes Intravenous  Once 05/29/20 1826 05/29/20 1959      PRN meds: acetaminophen, albuterol, diphenhydrAMINE-zinc acetate, guaiFENesin-dextromethorphan, loperamide, melatonin, ondansetron **OR** ondansetron (ZOFRAN) IV, sodium chloride flush   Objective: Vitals:   06/03/20 0421 06/03/20 1440  BP: (!) 134/101 (!) 134/91  Pulse: 65 (!) 102  Resp: 20 16  Temp: 98 F (36.7 C) 97.9 F (36.6 C)  SpO2: 90% 93%    Intake/Output Summary (Last 24 hours) at 06/03/2020 1545 Last data filed at 06/03/2020 1441 Gross per 24 hour  Intake 999 ml  Output 700 ml  Net 299 ml   Filed Weights   05/29/20 1802 05/30/20 0224 06/01/20 0352  Weight: 94.8 kg 83.7 kg 87 kg   Weight change:  Body mass index is 28.32 kg/m.   Physical Exam: General exam: Pleasant elderly Caucasian male.  Propped up in bed.  Not in physical distress Skin: No rashes, lesions or ulcers. HEENT: Atraumatic, normocephalic, no obvious bleeding Lungs: Diminished air entry bilaterally.  No crackles or wheezing. CVS: Regular rate and rhythm, no murmur GI/Abd soft, nontender, nondistended, bowel sound present CNS: Alert, awake, oriented to place and person, Psychiatry: Mood appropriate Extremities: No pedal edema, no calf tenderness  Data Review: I have personally reviewed the laboratory data and studies available.  Recent Labs  Lab 05/30/20 0508 05/31/20 0500 06/01/20 0350 06/02/20 0422 06/03/20 0349  WBC 6.0 7.9 7.7 8.5 7.3  NEUTROABS 5.6 7.4 7.3 8.0* 6.8   HGB 8.9* 9.6* 8.9* 10.1* 9.1*  HCT 30.4* 32.5* 28.8* 33.0* 31.0*  MCV 100.3* 99.4 95.4 94.3 98.4  PLT 268 329 355 344 279   Recent Labs  Lab 05/30/20 0508 05/31/20 0500 06/01/20 0350 06/02/20 0422 06/03/20 0349  NA 154* 147* 144 142 145  K 4.2 3.8 3.8 4.5 4.3  CL 124* 117* 117* 113* 118*  CO2 18* 17* 14* 17* 16*  GLUCOSE 138* 125* 116* 112* 103*  BUN 69* 75* 78* 82* 83*  CREATININE 2.06* 1.74* 1.53* 1.54* 1.42*  CALCIUM 7.8* 8.0* 7.5* 7.8* 7.6*  MG 2.0 2.2 2.2 2.3 2.4    F/u labs ordered Unresulted Labs (From admission, onward)         None     Signed, [name], MD [facility] 06/03/2020

## 2020-06-03 NOTE — Clinical Social Work Note (Cosign Needed)
    Durable Medical Equipment  (From admission, onward)         Start     Ordered   06/03/20 1529  For home use only DME Hospital bed  Once       Question Answer Comment  Length of Need Lifetime   Head must be elevated greater than: 30 degrees   Bed type Semi-electric   Hoyer Lift Yes   Trapeze Bar Yes   Support Surface: Gel Overlay      06/03/20 1529   06/03/20 1243  For home use only DME oxygen  Once       Question Answer Comment  Length of Need Lifetime   Mode or (Route) Nasal cannula   Liters per Minute 4   Frequency Continuous (stationary and portable oxygen unit needed)   Oxygen conserving device Yes   Oxygen delivery system Gas      06/03/20 1242

## 2020-06-03 NOTE — Progress Notes (Addendum)
   ORTHOPAEDIC PROGRESS NOTE  s/p IMN right proximal humerus fracture on 05/12/20 with Dr. Percell Miller  SUBJECTIVE: Patient denies any pain in the right upper extremity. Patient recently readmitted to the hospital on 05/29/20 with COVID pneumonia and acute respiratory failure with hypoxia.  OBJECTIVE: PE: General: sitting up in hospital bed, NAD Right upper extremity: proximal incision healing well. Does have a vicryl suture that is spitting out from proximal aspect of incision - this was trimmed down. No drainage or surrounding erythema. Two remaining nylon sutures present at the distal incisions. These were removed. Distal incisions healing well. He endorses axillary nerve sensation. Deltoid firing.  + Motor in  AIN, PIN, Ulnar distributions. Sensation intact in medial, radial, and ulnar distributions. Well perfused digits.    Vitals:   06/03/20 1440 06/03/20 1600  BP: (!) 134/91   Pulse: (!) 102   Resp: 16   Temp: 97.9 F (36.6 C)   SpO2: 93% (!) 76%   Post-op images ordered.   ASSESSMENT: Nathan [name] is a 80 y.o. male status post IMN right proximal humerus fracture.  - 3 weeks post-op  PLAN: Weightbearing: NWB RUE   - sling x 6 weeks (3 more weeks)  - can begin pendulums at 4 weeks post-op (1 more week)  - no passive/active ROM right shoulder  - okay for passive/active ROM right elbow, wrist, hand Insicional and dressing care: Remaining nylon sutures were removed. Proximal incision with vicryl suture spitting out this was trimmed. Steri-strips placed over proximal incision. These can be removed in 1 week Orthopedic device(s): Sling. New sling ordered as patient did not have a sling in his room and did not recall where it was VTE prophylaxis: per primary team Pain control: Patient not complaining of pain in the right shoulder. Tylenol as needed. Ice.  Follow - up plan: 3 weeks in office with Dr. Leamon Arnt information:  Dr. Fredonia Highland, Noemi Chapel PA-C, After hours  and holidays please check [email or web address] for group call information for [facility]  Noemi Chapel, PA-C 06/03/2020

## 2020-06-03 NOTE — TOC Progression Note (Signed)
Transition of Care [facility]) - Progression Note    Patient Details  Name: Nathan [name] MRN: [id number] Date of Birth: 11/29/1940  Transition of Care [facility]) CM/SW Contact  Purcell Mouton, RN Phone Number: 06/03/2020, 9:09 AM  Clinical Narrative:     Montine Circle called to state that pt will go home with [facility] and [facility]. Pt will need a hospital bed, Oxygen and Home Heath.  TOC team will call Montine Circle back with [facility] agencies.   Expected Discharge Plan: [place] Barriers to Discharge: No Barriers Identified  Expected Discharge Plan and Services Expected Discharge Plan: [place] arrangements for the past 2 months: [place]                                       Social Determinants of Health (SDOH) Interventions    Readmission Risk Interventions No flowsheet data found.

## 2020-06-03 NOTE — Plan of Care (Signed)
  Problem: Education: Goal: Knowledge of General Education information will improve Description: Including pain rating scale, medication(s)/side effects and non-pharmacologic comfort measures 06/03/2020 1813 by Nolene Ebbs, RN Outcome: Progressing 06/03/2020 1812 by Nolene Ebbs, RN Outcome: Progressing   Problem: Health Behavior/Discharge Planning: Goal: Ability to manage health-related needs will improve 06/03/2020 1813 by Nolene Ebbs, RN Outcome: Progressing 06/03/2020 1812 by Nolene Ebbs, RN Outcome: Progressing   Problem: Clinical Measurements: Goal: Ability to maintain clinical measurements within normal limits will improve 06/03/2020 1813 by Nolene Ebbs, RN Outcome: Progressing 06/03/2020 1812 by Nolene Ebbs, RN Outcome: Progressing Goal: Will remain free from infection 06/03/2020 1813 by Nolene Ebbs, RN Outcome: Progressing 06/03/2020 1812 by Nolene Ebbs, RN Outcome: Progressing Goal: Diagnostic test results will improve 06/03/2020 1813 by Nolene Ebbs, RN Outcome: Progressing 06/03/2020 1812 by Nolene Ebbs, RN Outcome: Progressing Goal: Respiratory complications will improve 06/03/2020 1813 by Nolene Ebbs, RN Outcome: Progressing 06/03/2020 1812 by Nolene Ebbs, RN Outcome: Progressing Goal: Cardiovascular complication will be avoided 06/03/2020 1813 by Nolene Ebbs, RN Outcome: Progressing 06/03/2020 1812 by Nolene Ebbs, RN Outcome: Progressing   Problem: Activity: Goal: Risk for activity intolerance will decrease 06/03/2020 1813 by Nolene Ebbs, RN Outcome: Progressing 06/03/2020 1812 by Nolene Ebbs, RN Outcome: Progressing   Problem: Nutrition: Goal: Adequate nutrition will be maintained 06/03/2020 1813 by Nolene Ebbs, RN Outcome: Progressing 06/03/2020 1812 by Nolene Ebbs, RN Outcome: Progressing   Problem: Coping: Goal: Level  of anxiety will decrease 06/03/2020 1813 by Nolene Ebbs, RN Outcome: Progressing 06/03/2020 1812 by Nolene Ebbs, RN Outcome: Progressing   Problem: Elimination: Goal: Will not experience complications related to bowel motility 06/03/2020 1813 by Nolene Ebbs, RN Outcome: Progressing 06/03/2020 1812 by Nolene Ebbs, RN Outcome: Progressing Goal: Will not experience complications related to urinary retention 06/03/2020 1813 by Nolene Ebbs, RN Outcome: Progressing 06/03/2020 1812 by Nolene Ebbs, RN Outcome: Progressing   Problem: Pain Managment: Goal: General experience of comfort will improve Outcome: Progressing   Problem: Safety: Goal: Ability to remain free from injury will improve 06/03/2020 1813 by Nolene Ebbs, RN Outcome: Progressing 06/03/2020 1812 by Nolene Ebbs, RN Outcome: Progressing   Problem: Skin Integrity: Goal: Risk for impaired skin integrity will decrease 06/03/2020 1813 by Nolene Ebbs, RN Outcome: Progressing 06/03/2020 1812 by Nolene Ebbs, RN Outcome: Progressing

## 2020-06-03 NOTE — Progress Notes (Signed)
CRITICAL VALUE ALERT  Critical Value:  INR 6.2  Date & Time Notied:  06/03/20 at 0452  Provider Notified: E.Ouma via AMION  Orders Received/Actions taken: No new orders

## 2020-06-03 NOTE — Progress Notes (Signed)
Notified Sharyn Creamer, pt transferred to 1525.

## 2020-06-03 NOTE — Progress Notes (Signed)
Patient oxygen saturation on room air at rest is 76%.

## 2020-06-04 ENCOUNTER — Encounter (HOSPITAL_COMMUNITY): Payer: Self-pay | Admitting: Anesthesiology

## 2020-06-04 DIAGNOSIS — J9601 Acute respiratory failure with hypoxia: Secondary | ICD-10-CM | POA: Diagnosis not present

## 2020-06-04 DIAGNOSIS — U071 COVID-19: Secondary | ICD-10-CM | POA: Diagnosis not present

## 2020-06-04 MED ORDER — CHLORHEXIDINE GLUCONATE CLOTH 2 % EX PADS
6.0000 | MEDICATED_PAD | Freq: Every day | CUTANEOUS | Status: DC
  Administered 2020-06-04 – 2020-06-10 (×5): 6 via TOPICAL

## 2020-06-04 MED ORDER — VITAMIN K1 10 MG/ML IJ SOLN
5.0000 mg | Freq: Once | INTRAVENOUS | Status: AC
  Administered 2020-06-04: 5 mg via INTRAVENOUS
  Filled 2020-06-04 (×2): qty 0.5

## 2020-06-04 MED ORDER — CEFAZOLIN SODIUM-DEXTROSE 2-4 GM/100ML-% IV SOLN
2.0000 g | INTRAVENOUS | Status: AC
  Filled 2020-06-04: qty 100

## 2020-06-04 MED ORDER — BENZOYL PEROXIDE 5 % EX GEL
Freq: Two times a day (BID) | CUTANEOUS | Status: DC
  Filled 2020-06-04: qty 42.5

## 2020-06-04 NOTE — Progress Notes (Signed)
PROGRESS NOTE  Nathan [name] JEHUDJ  DOB: 1941/01/14  PCP: Janith Lima, MD SHF:[id number]  DOA: 05/29/2020  LOS: 6 days   Chief Complaint  Patient presents with  . Altered Mental Status   Brief narrative: Nathan [name] is a 80 y.o. male with PMH significant for HTN, CKD 3B, chronic A. fib on Coumadin, left ventricle hypertrophy, thoracic aortic aneurysm, BPH, gout who lives at [facility]. Patient was brought to the ED on 2/11 for progressively worsening confusion, hypotension and hypoxia. Patient was recently hospitalized from 1/23-1/28 after a fall resulting in a fracture of the right humerus for which he underwent ORIF on 1/25 by Dr. Percell Miller. Of note, patient tested positive for Covid when checked prior to discharge to SNF. Per report, at the facility, for last 2 to 3 days, patient had low oral intake, progressively worsening confusion and also shortness of breath. SNF staff noted his blood pressure to be low at 70/40 and hence EMS was called. EMS noted blood pressure low at 68/40, O2 sat was 80% on room air. He was given 500 cc of normal saline, placed on 4 L oxygen by nasal cannula and brought to the ED.  In the ED, Chest x-ray revealed bilateral patchy infiltrates consistent with Covid pneumonia.   Inflammatory markers were elevated. Patient was admitted for COVID pneumonia.  Patient improved on a standard treatment for Covid pneumonia. Family made a choice not to return him back to SNF but rather take home. He was arranged for discharge today 2/17 with home health and DME's. However a repeat x-ray of right shoulder was done and reviewed by orthopedics. X-ray shows migration of orthopedic hardware with the proximal aspect of the intramedullary rod and the screws no longer seated on the humeral head fracture fragment. Orthopedic recommended repeat surgery. Discharge is held for that reason.  Subjective: Patient was seen and examined this morning. Propped  up in bed. Not in distress. On 4 L oxygen by nasal cannula. Patient was hoping he would be able to go home today. He is frustrated to know that he needs to stay longer for surgery.  Assessment/Plan: COVID pneumonia Acute respiratory failure with hypoxia   -Presented with worsening shortness of breath, lethargy -COVID test: PCR positive on 1/27 -Chest imaging: Chest x-ray on admission showed new bilateral airspace lung opacities consistent with multifocal pneumonia -Patient completed 5-day course of IV remdesivir, currently on Solu-Medrol 40 mg IV twice daily. He is also on baricitinib 1 mg daily for last 1 week. He has improved significantly. I will stop baricitinib today. -Oxygen - SpO2: 90 % O2 Flow Rate (L/min): 3 L/min -Supportive care: Vitamin C, Zinc, PRN inhalers, Tylenol, Antitussives (benzonatate/ Mucinex/Tussionex).   -Encouraged incentive spirometry, prone position, out of bed and early mobilization as much as possible -Continue airborne/contact isolation precautions for duration of 3 weeks from the day of diagnosis. -WBC and inflammatory markers trend as below. Recent Labs  Lab 05/29/20 1734 05/29/20 1734 05/29/20 1929 05/29/20 1954 05/30/20 0508 05/31/20 0500 05/31/20 0737 06/01/20 0350 06/02/20 0422 06/03/20 0349  WBC 8.7  --   --   --  6.0 7.9  --  7.7 8.5 7.3  LATICACIDVEN 1.6  2.5*  --   --  1.7  --   --   --   --   --   --   PROCALCITON  --   --  0.40  --   --  0.21  --  0.13  --   --  DDIMER  --    < > 2.14*  --  1.93* 1.67*  --  1.15* 0.91* 0.85*  FERRITIN  --   --   --   --   --   --  348*  --   --   --   CRP  --    < > 13.5*  --  12.2*  --  9.3* 4.9* 3.2* 2.3*  ALT 14  --   --   --  12 15  --  15 20 20    < > = values in this interval not displayed.   Abnormal urinalysis -Urinalysis with hazy urine, leukocytes and bacteria probably because of dehydration.  AKI on CKD 3B -Creatinine was 1.89 on last discharge, presented with a creatinine elevated 2.29.   Improving with IV fluid, creatinine is down to 1.42 on last check on 2/16..  Encourage oral hydration Recent Labs    05/12/20 0518 05/13/20 0906 05/14/20 0812 05/15/20 0318 05/29/20 1734 05/30/20 0508 05/31/20 0500 06/01/20 0350 06/02/20 0422 06/03/20 0349  BUN 49* 42* 49* 50* 72* 69* 75* 78* 82* 83*  CREATININE 2.03* 1.92* 1.96* 1.89* 2.29* 2.06* 1.74* 1.53* 1.54* 1.42*   Acute hypernatremia -Due to poor oral intake.   -Sodium level improved with half-normal saline. Encourage oral intake.  Currently not on IV fluid Recent Labs  Lab 05/29/20 1734 05/30/20 0508 05/31/20 0500 06/01/20 0350 06/02/20 0422 06/03/20 0349  NA 153* 154* 147* 144 142 145   Lactic acidosis Non-anion gap metabolic acidosis -Probably secondary to decreased tissue perfusion from dehydration and also anaerobic respiration due to COVID pneumonia.  Acute metabolic encephalopathy Mild chronic cognitive dysfunction -Acute change in mental status because of dehydration, infection. -Patient also has mild chronic cognitive dysfunction.  Continue to monitor  Essential hypertension -Continue to monitor on Cardizem, metoprolol.   -On last hospitalization, lisinopril was stopped and Lasix was reduced.   -Blood pressure stable at this time.  Elevated INR -INR was elevated for unclear reason.  Gradually improving, 6.2 on last check on 2/16. -For several years, patient was on long-term anticoagulation with Coumadin for A. fib.   -Patient is at significant risk of fall and fatal bleeding. Discussed with family. We will not resume Coumadin at this time. Recent Labs  Lab 05/29/20 1734 05/30/20 0508 06/01/20 0350 06/02/20 0422 06/03/20 0349  INR 8.8* 9.4* 10.1* 7.9* 6.2*   Afib -On metoprolol and Cardizem.  Since blood pressure is improved, okay to continue both. -Anticoagulation plan as above.  Acute on chronic anemia -Baseline hemoglobin above 10.  No active bleeding.  Hemoglobin remains at  baseline. -Continue to monitor.   Recent Labs    05/30/20 0508 05/31/20 0500 05/31/20 0737 06/01/20 0350 06/02/20 0422 06/03/20 0349  HGB 8.9* 9.6*  --  8.9* 10.1* 9.1*  MCV 100.3* 99.4  --  95.4 94.3 98.4  VITAMINB12  --   --  492  --   --   --   FOLATE  --   --  20.0  --   --   --   FERRITIN  --   --  348*  --   --   --   TIBC  --   --  142*  --   --   --   IRON  --   --  51  --   --   --   RETICCTPCT  --  1.7  --   --   --   --  Recent comminuted fracture of right humerus -After mechanical fall. -1/25, underwent ORIF of right humerus by Dr. Percell Miller. -Repeat x-ray obtained on 2/16 which showed migration of the orthopedic hardware. Repeat surgery recommended by orthopedics. -Continue pain control with oral oxycodone. -PT eval postprocedure  Impaired mobility -Prior to last admission, patient was using a walker at home. -Discharged to SNF on last admission.  Seen by PT.  Limited mobility because of walker dependence but inability to bear weight on the right upper extremity due to fracture  Mild protein calorie malnutrition Hypoalbuminemia -Albumin level low at 1.9.  Secondary to poor appetite, poor p.o. intake -Nutrition consult  Gout -Continuing home regimen of allopurinol  Mobility: PT eval required post procedure Code Status:   Code Status: Full Code  Nutritional status: Body mass index is 28.32 kg/m. Nutrition Problem: Inadequate oral intake Etiology: poor appetite Signs/Symptoms: meal completion < 25%,per patient/family report Diet Order            Diet NPO time specified  Diet effective now                 DVT prophylaxis: SCDs Start: 05/29/20 2011   Antimicrobials:  None Fluid: Not on IV fluid Consultants: None Family Communication:  Family updated.  Orthopedic team talked to family as well.  Status is: Inpatient  Remains inpatient appropriate because: Needs repeat surgical fixation of right humerus fracture  Dispo: The patient is from:  SNF              Anticipated d/c is to: Family was willing to take home with DMEs, hospital bed. Plan may change post procedure              Anticipated d/c date is: Undecided              Patient currently is not medically stable to d/c.   Difficult to place patient No  Infusions:    Scheduled Meds: . allopurinol  100 mg Oral Daily  . vitamin C  500 mg Oral Daily  . diltiazem  120 mg Oral Daily  . finasteride  5 mg Oral Daily  . methylPREDNISolone (SOLU-MEDROL) injection  40 mg Intravenous Q12H  . metoprolol tartrate  50 mg Oral BID  . sodium chloride flush  10-40 mL Intracatheter Q12H  . zinc sulfate  220 mg Oral Daily    Antimicrobials: Anti-infectives (From admission, onward)   Start     Dose/Rate Route Frequency Ordered Stop   06/01/20 0900  amoxicillin (AMOXIL) capsule 500 mg  Status:  Discontinued        500 mg Oral Every 8 hours 06/01/20 0858 06/03/20 1544   05/30/20 1000  remdesivir 100 mg in sodium chloride 0.9 % 100 mL IVPB       "Followed by" Linked Group Details   100 mg 200 mL/hr over 30 Minutes Intravenous Daily 05/29/20 2010 06/02/20 1151   05/30/20 0200  cefTRIAXone (ROCEPHIN) 1 g in sodium chloride 0.9 % 100 mL IVPB  Status:  Discontinued        1 g 200 mL/hr over 30 Minutes Intravenous Every 24 hours 05/29/20 2108 06/01/20 0857   05/29/20 2200  remdesivir 200 mg in sodium chloride 0.9% 250 mL IVPB       "Followed by" Linked Group Details   200 mg 580 mL/hr over 30 Minutes Intravenous Once 05/29/20 2010 05/29/20 2317   05/29/20 1845  vancomycin (VANCOREADY) IVPB 1750 mg/350 mL        1,750 mg  175 mL/hr over 120 Minutes Intravenous  Once 05/29/20 1835 05/29/20 2200   05/29/20 1830  vancomycin (VANCOCIN) IVPB 1000 mg/200 mL premix  Status:  Discontinued        1,000 mg 200 mL/hr over 60 Minutes Intravenous  Once 05/29/20 1826 05/29/20 1835   05/29/20 1830  ceFEPIme (MAXIPIME) 2 g in sodium chloride 0.9 % 100 mL IVPB        2 g 200 mL/hr over 30 Minutes  Intravenous  Once 05/29/20 1826 05/29/20 1959      PRN meds: acetaminophen, albuterol, diphenhydrAMINE-zinc acetate, guaiFENesin-dextromethorphan, loperamide, melatonin, ondansetron **OR** ondansetron (ZOFRAN) IV, sodium chloride flush   Objective: Vitals:   06/03/20 2036 06/04/20 0452  BP:  (!) 141/85  Pulse:  64  Resp:  20  Temp:  98 F (36.7 C)  SpO2: 91% 90%    Intake/Output Summary (Last 24 hours) at 06/04/2020 1107 Last data filed at 06/04/2020 0939 Gross per 24 hour  Intake 1419 ml  Output 1725 ml  Net -306 ml   Filed Weights   05/29/20 1802 05/30/20 0224 06/01/20 0352  Weight: 94.8 kg 83.7 kg 87 kg   Weight change:  Body mass index is 28.32 kg/m.   Physical Exam: General exam: Pleasant elderly Caucasian male.  Propped up in bed.  Not in physical distress Skin: No rashes, lesions or ulcers. HEENT: Atraumatic, normocephalic, no obvious bleeding Lungs: Diminished air entry bilaterally.  No crackles or wheezing. CVS: Regular rate and rhythm, no murmur GI/Abd soft, nontender, nondistended, bowel sound present CNS: Alert, awake, oriented to place and person, Psychiatry: Frustrated to learn that he is not able to go home today Extremities: No pedal edema, no calf tenderness  Data Review: I have personally reviewed the laboratory data and studies available.  Recent Labs  Lab 05/30/20 0508 05/31/20 0500 06/01/20 0350 06/02/20 0422 06/03/20 0349  WBC 6.0 7.9 7.7 8.5 7.3  NEUTROABS 5.6 7.4 7.3 8.0* 6.8  HGB 8.9* 9.6* 8.9* 10.1* 9.1*  HCT 30.4* 32.5* 28.8* 33.0* 31.0*  MCV 100.3* 99.4 95.4 94.3 98.4  PLT 268 329 355 344 279   Recent Labs  Lab 05/30/20 0508 05/31/20 0500 06/01/20 0350 06/02/20 0422 06/03/20 0349  NA 154* 147* 144 142 145  K 4.2 3.8 3.8 4.5 4.3  CL 124* 117* 117* 113* 118*  CO2 18* 17* 14* 17* 16*  GLUCOSE 138* 125* 116* 112* 103*  BUN 69* 75* 78* 82* 83*  CREATININE 2.06* 1.74* 1.53* 1.54* 1.42*  CALCIUM 7.8* 8.0* 7.5* 7.8* 7.6*  MG  2.0 2.2 2.2 2.3 2.4    F/u labs ordered Unresulted Labs (From admission, onward)          Start     Ordered   Unscheduled  Basic metabolic panel  Daily,   R     Question:  Specimen collection method  Answer:  IV Team=IV Team collect   06/04/20 1107   Unscheduled  CBC with Differential/Platelet  Daily,   R     Question:  Specimen collection method  Answer:  IV Team=IV Team collect   06/04/20 1107   Unscheduled  Protime-INR  Daily,   R     Question:  Specimen collection method  Answer:  IV Team=IV Team collect   06/04/20 1107         Signed, [name], MD [facility] 06/04/2020

## 2020-06-04 NOTE — TOC Progression Note (Addendum)
Transition of Care [facility]) - Progression Note    Patient Details  Name: Nathan [name] MRN: [id number] Date of Birth: Aug 19, 1940  Transition of Care [facility]) CM/SW [place], [place] Phone Number: 06/04/2020, 10:27 AM  Clinical Narrative:   During hand-off from [facility] co-worker was informed that plan of SNF was changed by family to home with DME, [facility] services.  Orders seen and appreciated.  Contacted [facility] for delivery of hospital bed, contacted Cindie with [facility] who confirmed ability to provide [facility] services. TOC will continue to follow during the course of hospitalization.  Addendum:  O2 was also to come from [facility].  I messaged Zach and asked him to hold up as plan has now changed.  Patient will now need shoulder surgery during this hospitalization.      Expected Discharge Plan: [place] Barriers to Discharge: No Barriers Identified  Expected Discharge Plan and Services Expected Discharge Plan: [place] arrangements for the past 2 months: [place]                                       Social Determinants of Health (SDOH) Interventions    Readmission Risk Interventions No flowsheet data found.

## 2020-06-04 NOTE — Consult Note (Signed)
I was consulted regarding this patient's right arm injury.  In short he had a humeral nail that was appropriately positioned and well done on fluoroscopic imaging that I reviewed.  Unfortunately his fixation failed and the humeral head is now separate from his humeral shaft.  Based on this and his prominence of his nail we feel that he would benefit from further surgical management.  We talked to his son on the phone and consulted with him about his options.  The family agreed to proceed with a reverse social arthroplasty and removal of hardware.  We specifically discussed the risk of periprosthetic fracture, hardware loss of fixation, axillary nerve injury and infection were all of which are increased for this patient amongst other issues including dislocation.  We will plan for surgery on 06/05/2020.

## 2020-06-04 NOTE — Progress Notes (Signed)
SATURATION QUALIFICATIONS: (This note is used to comply with regulatory documentation for home oxygen)  Patient Saturations on Room Air at Rest = 76%  Please briefly explain why patient needs home oxygen: Patient will require home oxygen to keep O2 sats >85%.

## 2020-06-04 NOTE — Care Management Important Message (Signed)
Important Message  Patient Details IM Letter placed in door caddy for the Patient. Name: Nathan [name] MRN: [id number] Date of Birth: 11-19-1940   Medicare Important Message Given:  Yes     Kerin Salen 06/04/2020, 12:40 PM

## 2020-06-05 DIAGNOSIS — J9601 Acute respiratory failure with hypoxia: Secondary | ICD-10-CM | POA: Diagnosis not present

## 2020-06-05 DIAGNOSIS — U071 COVID-19: Secondary | ICD-10-CM | POA: Diagnosis not present

## 2020-06-05 LAB — CBC WITH DIFFERENTIAL/PLATELET
Abs Immature Granulocytes: 0.13 10*3/uL — ABNORMAL HIGH (ref 0.00–0.07)
Basophils Absolute: 0 10*3/uL (ref 0.0–0.1)
Basophils Relative: 0 %
Eosinophils Absolute: 0 10*3/uL (ref 0.0–0.5)
Eosinophils Relative: 0 %
HCT: 29.4 % — ABNORMAL LOW (ref 39.0–52.0)
Hemoglobin: 9.3 g/dL — ABNORMAL LOW (ref 13.0–17.0)
Immature Granulocytes: 1 %
Lymphocytes Relative: 1 %
Lymphs Abs: 0.1 10*3/uL — ABNORMAL LOW (ref 0.7–4.0)
MCH: 29.7 pg (ref 26.0–34.0)
MCHC: 31.6 g/dL (ref 30.0–36.0)
MCV: 93.9 fL (ref 80.0–100.0)
Monocytes Absolute: 0.4 10*3/uL (ref 0.1–1.0)
Monocytes Relative: 3 %
Neutro Abs: 12.2 10*3/uL — ABNORMAL HIGH (ref 1.7–7.7)
Neutrophils Relative %: 95 %
Platelets: 270 10*3/uL (ref 150–400)
RBC: 3.13 MIL/uL — ABNORMAL LOW (ref 4.22–5.81)
RDW: 16 % — ABNORMAL HIGH (ref 11.5–15.5)
WBC: 12.9 10*3/uL — ABNORMAL HIGH (ref 4.0–10.5)
nRBC: 0 % (ref 0.0–0.2)

## 2020-06-05 LAB — BASIC METABOLIC PANEL
Anion gap: 9 (ref 5–15)
BUN: 81 mg/dL — ABNORMAL HIGH (ref 8–23)
CO2: 16 mmol/L — ABNORMAL LOW (ref 22–32)
Calcium: 7.5 mg/dL — ABNORMAL LOW (ref 8.9–10.3)
Chloride: 118 mmol/L — ABNORMAL HIGH (ref 98–111)
Creatinine, Ser: 1.45 mg/dL — ABNORMAL HIGH (ref 0.61–1.24)
GFR, Estimated: 48 mL/min — ABNORMAL LOW (ref >60)
Glucose, Bld: 117 mg/dL — ABNORMAL HIGH (ref 70–99)
Potassium: 4.1 mmol/L (ref 3.5–5.1)
Sodium: 143 mmol/L (ref 135–145)

## 2020-06-05 LAB — PROTIME-INR
INR: 3.7 — ABNORMAL HIGH (ref 0.8–1.2)
Prothrombin Time: 35.5 seconds — ABNORMAL HIGH (ref 11.4–15.2)

## 2020-06-05 MED ORDER — ENSURE ENLIVE PO LIQD
237.0000 mL | Freq: Three times a day (TID) | ORAL | Status: DC
  Administered 2020-06-05 – 2020-06-10 (×10): 237 mL via ORAL

## 2020-06-05 MED ORDER — ADULT MULTIVITAMIN W/MINERALS CH
1.0000 | ORAL_TABLET | Freq: Every day | ORAL | Status: DC
  Administered 2020-06-05 – 2020-06-10 (×6): 1 via ORAL
  Filled 2020-06-05 (×6): qty 1

## 2020-06-05 MED ORDER — ACETAMINOPHEN 500 MG PO TABS
1000.0000 mg | ORAL_TABLET | Freq: Once | ORAL | Status: DC
  Filled 2020-06-05: qty 2

## 2020-06-05 NOTE — Plan of Care (Signed)
  Problem: Clinical Measurements: Goal: Respiratory complications will improve Outcome: Progressing   Problem: Clinical Measurements: Goal: Cardiovascular complication will be avoided Outcome: Progressing   Problem: Pain Managment: Goal: General experience of comfort will improve Outcome: Progressing   Problem: Safety: Goal: Ability to remain free from injury will improve Outcome: Progressing   Problem: Skin Integrity: Goal: Risk for impaired skin integrity will decrease Outcome: Progressing   

## 2020-06-05 NOTE — Progress Notes (Signed)
Physical Therapy Treatment Patient Details Name: Nathan [name] MRN: [id number] DOB: Feb 24, 1941 Today's Date: 06/05/2020    History of Present Illness Nathan [name] is a 80 y.o. male with PMH significant for history of atrial fibrillation on anticoagulation, hypertension.  Patient presented to the ED on 1/23 after a fall impacting his right shoulder on the ground. Patient now s/p ORIF right humerus 05/12/20.    PT Comments    Pt's sling was not properly donned, adjusted sling, however repeatedly moved arm out of sling and weight beared on RUE. Pt not able to follow precautions for RUE. Total assist for supine to sit. Pt sat edge of bed  For ~9 minutes with repeated cues for correcting heavy posterior lean. Unsafe to attempt standing due to poor sitting balance.    Follow Up Recommendations  SNF;Home health PT (per chart, family planning DC home, SNF if family cannot provide 24* care)     Equipment Recommendations  Wheelchair cushion (measurements PT);Wheelchair (measurements PT);Hospital bed;Other (comment) (hoyer lift)    Recommendations for Other Services       Precautions / Restrictions Precautions Precautions: Fall;Shoulder Type of Shoulder Precautions: -Per ortho PA on 06/03/20:  NWB RUE - sling x 6 weeks (3 more weeks) - can begin pendulums at 4 weeks post-op (1 more week) - no passive/active ROM right shoulder - okay for passive/active ROM right elbow, wrist, hand Shoulder Interventions: Shoulder sling/immobilizer;At all times;Off for dressing/bathing/exercises Restrictions Other Position/Activity Restrictions: may bear 5 lbs to elbow.  Restrictions based on notes from 05/15/20 when pt dc to SNF.    Mobility  Bed Mobility Overal bed mobility: Needs Assistance Bed Mobility: Supine to Sit;Sit to Supine     Supine to sit: Total assist Sit to supine: Total assist   General bed mobility comments: assist to raise trunk, heavy posterior lean in sitting and SaO2 80% on 6L O2  sitting with 3/4 dyspnea. Pt sat EOB x 9 minutes with frequent verbal and manual cues to correct posterior lean. No +2 assist available, but would be beneficial for all mobility.    Transfers                    Ambulation/Gait                 Stairs             Wheelchair Mobility    Modified Rankin (Stroke Patients Only)       Balance Overall balance assessment: Needs assistance Sitting-balance support: Single extremity supported;Feet supported Sitting balance-Leahy Scale: Zero Sitting balance - Comments: mod/max assist static sitting Postural control: Posterior lean;Right lateral lean                                  Cognition Arousal/Alertness: Awake/alert Behavior During Therapy: WFL for tasks assessed/performed Overall Cognitive Status: No family/caregiver present to determine baseline cognitive functioning                                 General Comments: mostly appropriate but confused, oriented to self only.  Confusion vs HOH. Speech is somewhat slurred.      Exercises General Exercises - Lower Extremity Long Arc Quad: AAROM;Both;10 reps;Seated    General Comments        Pertinent Vitals/Pain Pain Assessment: No/denies pain    Home Living  Prior Function            PT Goals (current goals can now be found in the care plan section) Acute Rehab PT Goals Patient Stated Goal: To get moving better PT Goal Formulation: Patient unable to participate in goal setting Time For Goal Achievement: 06/14/20 Potential to Achieve Goals: Fair Progress towards PT goals: Progressing toward goals    Frequency    Min 3X/week      PT Plan Current plan remains appropriate    Co-evaluation              AM-PAC PT "6 Clicks" Mobility   Outcome Measure  Help needed turning from your back to your side while in a flat bed without using bedrails?: Total Help needed moving from  lying on your back to sitting on the side of a flat bed without using bedrails?: Total Help needed moving to and from a bed to a chair (including a wheelchair)?: Total Help needed standing up from a chair using your arms (e.g., wheelchair or bedside chair)?: Total Help needed to walk in hospital room?: Total Help needed climbing 3-5 steps with a railing? : Total 6 Click Score: 6    End of Session Equipment Utilized During Treatment: Oxygen Activity Tolerance: Patient limited by fatigue Patient left: in bed;with call bell/phone within reach;with bed alarm set;with nursing/sitter in room Nurse Communication: Mobility status;Need for lift equipment PT Visit Diagnosis: Unsteadiness on feet (R26.81);Other abnormalities of gait and mobility (R26.89);Muscle weakness (generalized) (M62.81)     Time: 6578-4696 PT Time Calculation (min) (ACUTE ONLY): 30 min  Charges:  $Therapeutic Activity: 23-37 mins                     Blondell Reveal Kistler PT 06/05/2020  Acute Rehabilitation Services Pager [phone] Office [phone]

## 2020-06-05 NOTE — Progress Notes (Signed)
PROGRESS NOTE  Nathan [name] [id number]  DOB: May 01, 1940  PCP: Janith Lima, MD GLO:[id number]  DOA: 05/29/2020  LOS: 7 days   Chief Complaint  Patient presents with  . Altered Mental Status   Brief narrative: Nathan [name] is a 80 y.o. male with PMH significant for HTN, CKD 3B, chronic A. fib on Coumadin, left ventricle hypertrophy, thoracic aortic aneurysm, BPH, gout who lives at [facility]. Patient was brought to the ED on 2/11 for progressively worsening confusion, hypotension and hypoxia. Patient was recently hospitalized from 1/23-1/28 after a fall resulting in a fracture of the right humerus for which he underwent ORIF on 1/25 by Dr. Percell Miller. Of note, patient tested positive for Covid when checked prior to discharge to SNF. Per report, at the facility, for last 2 to 3 days, patient had low oral intake, progressively worsening confusion and also shortness of breath. SNF staff noted his blood pressure to be low at 70/40 and hence EMS was called. EMS noted blood pressure low at 68/40, O2 sat was 80% on room air. He was given 500 cc of normal saline, placed on 4 L oxygen by nasal cannula and brought to the ED.  In the ED, Chest x-ray revealed bilateral patchy infiltrates consistent with Covid pneumonia.   Inflammatory markers were elevated. Patient was admitted for COVID pneumonia.  Patient improved on a standard treatment for Covid pneumonia. Family made a choice not to return him back to SNF but rather take home. He was arranged for discharge today 2/17 with home health and DME's. However a repeat x-ray of right shoulder was done and reviewed by orthopedics. X-ray shows migration of orthopedic hardware with the proximal aspect of the intramedullary rod and the screws no longer seated on the humeral head fracture fragment. Orthopedic recommended repeat surgery. Discharge is held for that reason.  Subjective: Patient seen and examined.  He is fully alert but  oriented x1 only.  Has no complaint.  Assessment/Plan: COVID pneumonia Acute respiratory failure with hypoxia   -Presented with worsening shortness of breath, lethargy -COVID test: PCR positive on 1/27 -Chest imaging: Chest x-ray on admission showed new bilateral airspace lung opacities consistent with multifocal pneumonia -Patient completed 5-day course of IV remdesivir, currently on Solu-Medrol 40 mg IV twice daily. He also received baricitinib 1 mg daily for last 1 week which was eventually discontinued. -Oxygen - SpO2: 98 % O2 Flow Rate (L/min): 5 L/min -Supportive care: Vitamin C, Zinc, PRN inhalers, Tylenol, Antitussives (benzonatate/ Mucinex/Tussionex).   -Encouraged incentive spirometry, prone position, out of bed and early mobilization as much as possible -Continue airborne/contact isolation precautions until 06/08/2020 per ID. Leukocytosis resolved and inflammatory markers trend as below. Recent Labs  Lab 05/29/20 1734 05/29/20 1734 05/29/20 1929 05/29/20 1954 05/30/20 0508 05/31/20 0500 05/31/20 0737 06/01/20 0350 06/02/20 0422 06/03/20 0349 06/05/20 0327  WBC 8.7  --   --   --  6.0 7.9  --  7.7 8.5 7.3 12.9*  LATICACIDVEN 1.6  2.5*  --   --  1.7  --   --   --   --   --   --   --   PROCALCITON  --   --  0.40  --   --  0.21  --  0.13  --   --   --   DDIMER  --    < > 2.14*  --  1.93* 1.67*  --  1.15* 0.91* 0.85*  --   FERRITIN  --   --   --   --   --   --  348*  --   --   --   --   CRP  --    < > 13.5*  --  12.2*  --  9.3* 4.9* 3.2* 2.3*  --   ALT 14  --   --   --  12 15  --  15 20 20   --    < > = values in this interval not displayed.   Abnormal urinalysis -Urinalysis with hazy urine, leukocytes and bacteria probably because of dehydration.  AKI on CKD 3B -Creatinine was 1.89 on last discharge, presented with a creatinine elevated 2.29.  Improving with IV fluid, creatinine is down to 1.42 on last check on 2/16..  Encourage oral hydration.  Creatinine back to  baseline. Recent Labs    05/13/20 0906 05/14/20 0812 05/15/20 0318 05/29/20 1734 05/30/20 0508 05/31/20 0500 06/01/20 0350 06/02/20 0422 06/03/20 0349 06/05/20 0327  BUN 42* 49* 50* 72* 69* 75* 78* 82* 83* 81*  CREATININE 1.92* 1.96* 1.89* 2.29* 2.06* 1.74* 1.53* 1.54* 1.42* 1.45*   Acute hypernatremia -Due to poor oral intake.   -Sodium level improved with half-normal saline. Encourage oral intake.  Currently not on IV fluid Recent Labs  Lab 05/29/20 1734 05/30/20 0508 05/31/20 0500 06/01/20 0350 06/02/20 0422 06/03/20 0349 06/05/20 0327  NA 153* 154* 147* 144 142 145 143   Lactic acidosis Non-anion gap metabolic acidosis -Probably secondary to decreased tissue perfusion from dehydration and also anaerobic respiration due to COVID pneumonia.  Acute metabolic encephalopathy Mild chronic cognitive dysfunction -Acute change in mental status because of dehydration, infection. -Patient also has mild chronic cognitive dysfunction.  He is at his baseline.  Continue to monitor  Essential hypertension -Continue to monitor on Cardizem, metoprolol.   -On last hospitalization, lisinopril was stopped and Lasix was reduced.   -Blood pressure stable at this time.  Elevated INR -INR was elevated for unclear reason.  Gradually improving, 6.2 on last check on 2/16. -For several years, patient was on long-term anticoagulation with Coumadin for A. fib.   -Patient is at significant risk of fall and fatal bleeding. Discussed with family. We will not resume Coumadin at this time.  His INR is 3.5 today.  He received 10 mg of vitamin K yesterday Recent Labs  Lab 05/30/20 0508 06/01/20 0350 06/02/20 0422 06/03/20 0349 06/05/20 0327  INR 9.4* 10.1* 7.9* 6.2* 3.7*   Afib -On metoprolol and Cardizem.  Rates controlled.  Anticoagulation is stopped indefinitely due to high risk of fall.  Acute on chronic anemia -Baseline hemoglobin above 10.  No active bleeding.  Hemoglobin remains at  baseline. -Continue to monitor.   Recent Labs    05/31/20 0500 05/31/20 0737 06/01/20 0350 06/02/20 0422 06/03/20 0349 06/05/20 0327  HGB 9.6*  --  8.9* 10.1* 9.1* 9.3*  MCV 99.4  --  95.4 94.3 98.4 93.9  VITAMINB12  --  492  --   --   --   --   FOLATE  --  20.0  --   --   --   --   FERRITIN  --  348*  --   --   --   --   TIBC  --  142*  --   --   --   --   IRON  --  51  --   --   --   --   RETICCTPCT 1.7  --   --   --   --   --    Recent  comminuted fracture of right humerus -After mechanical fall. -1/25, underwent ORIF of right humerus by Dr. Percell Miller. -Repeat x-ray obtained on 2/16 which showed migration of the orthopedic hardware. Repeat surgery recommended by orthopedics.  He was scheduled to have surgery today however unfortunately his INR still is 3.5.  Surgery is requesting INR to be 1.5 or less.  Vitamin K would not make any difference today.  I discussed with orthopedic and requested to defer surgery for few hours so we can perhaps provide him with a few units of FFP's in an effort to bring the INR down however I was informed that they will not be able to do surgery later today and not even on weekends.  They have rescheduled his surgery for Monday. Continue current pain management.  Impaired mobility -Prior to last admission, patient was using a walker at home. -Discharged to SNF on last admission.  Seen by PT.  Limited mobility because of walker dependence but inability to bear weight on the right upper extremity due to fracture  Mild protein calorie malnutrition Hypoalbuminemia -Albumin level low at 1.9.  Secondary to poor appetite, poor p.o. intake -Nutrition consult  Gout -Continuing home regimen of allopurinol  Mobility: PT eval required post procedure Code Status:   Code Status: Full Code  Nutritional status: Body mass index is 28.32 kg/m. Nutrition Problem: Inadequate oral intake Etiology: poor appetite Signs/Symptoms: meal completion < 25%,per  patient/family report Diet Order            Diet Heart Room service appropriate? Yes; Fluid consistency: Thin  Diet effective now                 DVT prophylaxis: SCDs Start: 05/29/20 2011   Antimicrobials:  None Fluid: Not on IV fluid Consultants: None Family Communication:  Updated his son over the phone.  Status is: Inpatient  Remains inpatient appropriate because: Needs repeat surgical fixation of right humerus fracture  Dispo: The patient is from: SNF              Anticipated d/c is to: Family was willing to take home with DMEs, hospital bed. Plan may change post procedure              Anticipated d/c date is: Undecided              Patient currently is not medically stable to d/c.   Difficult to place patient No  Infusions:  .  ceFAZolin (ANCEF) IV      Scheduled Meds: . acetaminophen  1,000 mg Oral Once  . allopurinol  100 mg Oral Daily  . vitamin C  500 mg Oral Daily  . benzoyl peroxide   Topical BID  . Chlorhexidine Gluconate Cloth  6 each Topical Q0600  . diltiazem  120 mg Oral Daily  . finasteride  5 mg Oral Daily  . methylPREDNISolone (SOLU-MEDROL) injection  40 mg Intravenous Q12H  . metoprolol tartrate  50 mg Oral BID  . sodium chloride flush  10-40 mL Intracatheter Q12H  . zinc sulfate  220 mg Oral Daily    Antimicrobials: Anti-infectives (From admission, onward)   Start     Dose/Rate Route Frequency Ordered Stop   06/05/20 1230  ceFAZolin (ANCEF) IVPB 2g/100 mL premix        2 g 200 mL/hr over 30 Minutes Intravenous On call to O.R. 06/04/20 1341 06/06/20 0559   06/01/20 0900  amoxicillin (AMOXIL) capsule 500 mg  Status:  Discontinued  500 mg Oral Every 8 hours 06/01/20 0858 06/03/20 1544   05/30/20 1000  remdesivir 100 mg in sodium chloride 0.9 % 100 mL IVPB       "Followed by" Linked Group Details   100 mg 200 mL/hr over 30 Minutes Intravenous Daily 05/29/20 2010 06/02/20 1151   05/30/20 0200  cefTRIAXone (ROCEPHIN) 1 g in sodium  chloride 0.9 % 100 mL IVPB  Status:  Discontinued        1 g 200 mL/hr over 30 Minutes Intravenous Every 24 hours 05/29/20 2108 06/01/20 0857   05/29/20 2200  remdesivir 200 mg in sodium chloride 0.9% 250 mL IVPB       "Followed by" Linked Group Details   200 mg 580 mL/hr over 30 Minutes Intravenous Once 05/29/20 2010 05/29/20 2317   05/29/20 1845  vancomycin (VANCOREADY) IVPB 1750 mg/350 mL        1,750 mg 175 mL/hr over 120 Minutes Intravenous  Once 05/29/20 1835 05/29/20 2200   05/29/20 1830  vancomycin (VANCOCIN) IVPB 1000 mg/200 mL premix  Status:  Discontinued        1,000 mg 200 mL/hr over 60 Minutes Intravenous  Once 05/29/20 1826 05/29/20 1835   05/29/20 1830  ceFEPIme (MAXIPIME) 2 g in sodium chloride 0.9 % 100 mL IVPB        2 g 200 mL/hr over 30 Minutes Intravenous  Once 05/29/20 1826 05/29/20 1959      PRN meds: acetaminophen, albuterol, diphenhydrAMINE-zinc acetate, guaiFENesin-dextromethorphan, loperamide, melatonin, ondansetron **OR** ondansetron (ZOFRAN) IV, sodium chloride flush   Objective: Vitals:   06/05/20 0459 06/05/20 0501  BP: 134/66 134/66  Pulse: (!) 57 (!) 57  Resp: 20 20  Temp: 98 F (36.7 C) 98 F (36.7 C)  SpO2:  98%    Intake/Output Summary (Last 24 hours) at 06/05/2020 1145 Last data filed at 06/05/2020 0521 Gross per 24 hour  Intake 50 ml  Output 575 ml  Net -525 ml   Filed Weights   05/29/20 1802 05/30/20 0224 06/01/20 0352  Weight: 94.8 kg 83.7 kg 87 kg   Weight change:  Body mass index is 28.32 kg/m.   Physical Exam:  General exam: Appears calm and comfortable  Respiratory system: Clear to auscultation. Respiratory effort normal. Cardiovascular system: S1 & S2 heard, RRR. No JVD, murmurs, rubs, gallops or clicks. No pedal edema. Gastrointestinal system: Abdomen is nondistended, soft and nontender. No organomegaly or masses felt. Normal bowel sounds heard. Central nervous system: Alert and oriented x1. No focal neurological  deficits. Skin: No rashes, lesions or ulcers.  Psychiatry: Judgement and insight appear poor  Data Review: I have personally reviewed the laboratory data and studies available.  Recent Labs  Lab 05/31/20 0500 06/01/20 0350 06/02/20 0422 06/03/20 0349 06/05/20 0327  WBC 7.9 7.7 8.5 7.3 12.9*  NEUTROABS 7.4 7.3 8.0* 6.8 12.2*  HGB 9.6* 8.9* 10.1* 9.1* 9.3*  HCT 32.5* 28.8* 33.0* 31.0* 29.4*  MCV 99.4 95.4 94.3 98.4 93.9  PLT 329 355 344 279 270   Recent Labs  Lab 05/30/20 0508 05/31/20 0500 06/01/20 0350 06/02/20 0422 06/03/20 0349 06/05/20 0327  NA 154* 147* 144 142 145 143  K 4.2 3.8 3.8 4.5 4.3 4.1  CL 124* 117* 117* 113* 118* 118*  CO2 18* 17* 14* 17* 16* 16*  GLUCOSE 138* 125* 116* 112* 103* 117*  BUN 69* 75* 78* 82* 83* 81*  CREATININE 2.06* 1.74* 1.53* 1.54* 1.42* 1.45*  CALCIUM 7.8* 8.0* 7.5* 7.8* 7.6* 7.5*  MG 2.0 2.2 2.2 2.3 2.4  --     F/u labs ordered Unresulted Labs (From admission, onward)          Start     Ordered   06/05/20 2536  Basic metabolic panel  Daily,   R     Question:  Specimen collection method  Answer:  IV Team=IV Team collect   06/04/20 1107   06/05/20 0500  CBC with Differential/Platelet  Daily,   R     Question:  Specimen collection method  Answer:  IV Team=IV Team collect   06/04/20 1107   06/05/20 0500  Protime-INR  Daily,   R     Question:  Specimen collection method  Answer:  IV Team=IV Team collect   06/04/20 1107         Signed, Darliss Cheney, MD [facility] 06/05/2020

## 2020-06-05 NOTE — Progress Notes (Signed)
Nutrition Follow-up  INTERVENTION:   -Ensure Enlive po TID, each supplement provides 350 kcal and 20 grams of protein  -MVI with Minerals daily  NUTRITION DIAGNOSIS:   Inadequate oral intake related to poor appetite as evidenced by meal completion < 25%,per patient/family report.  Ongoing.  GOAL:   Patient will meet greater than or equal to 90% of their needs  Progressing.  MONITOR:   PO intake,Supplement acceptance,Weight trends,Labs  ASSESSMENT:   80 yo male admitted with acute respiratory failure with COVID pneumonia, AKI on CKD 3, acute hypernatremia in setting of poor po intake. PMH includes CKD, HTN  Patient currently consuming 85-100% of meals.  Pt seen by orthopedics and recommending repeat surgery on right shoulder. Scheduled for 2/21. Ensure and MVI ordered.  Admission weight: 184 lbs. Last weight 2/14: 191 lbs.  Labs reviewed. Medications: Vitamin C, Zinc sulfate  Diet Order:   Diet Order            Diet Heart Room service appropriate? Yes; Fluid consistency: Thin  Diet effective now                 EDUCATION NEEDS:   Not appropriate for education at this time  Skin:  Skin Assessment: Skin Integrity Issues: Skin Integrity Issues:: Stage II,Stage III Stage II: perineum Stage III: heel  Last BM:  2/13  Height:   Ht Readings from Last 1 Encounters:  05/30/20 5\' 9"  (1.753 m)    Weight:   Wt Readings from Last 1 Encounters:  06/01/20 87 kg   BMI:  Body mass index is 28.32 kg/m.  Estimated Nutritional Needs:   Kcal:  1950-2150 kcals  Protein:  110-125 g  Fluid:  >/= 2 L  Clayton Bibles, MS, RD, LDN Inpatient Clinical Dietitian Contact information available via Amion

## 2020-06-06 ENCOUNTER — Inpatient Hospital Stay (HOSPITAL_COMMUNITY): Payer: Medicare Other

## 2020-06-06 DIAGNOSIS — J9601 Acute respiratory failure with hypoxia: Secondary | ICD-10-CM | POA: Diagnosis not present

## 2020-06-06 DIAGNOSIS — U071 COVID-19: Secondary | ICD-10-CM | POA: Diagnosis not present

## 2020-06-06 LAB — BASIC METABOLIC PANEL
Anion gap: 9 (ref 5–15)
BUN: 78 mg/dL — ABNORMAL HIGH (ref 8–23)
CO2: 16 mmol/L — ABNORMAL LOW (ref 22–32)
Calcium: 7.7 mg/dL — ABNORMAL LOW (ref 8.9–10.3)
Chloride: 117 mmol/L — ABNORMAL HIGH (ref 98–111)
Creatinine, Ser: 1.48 mg/dL — ABNORMAL HIGH (ref 0.61–1.24)
GFR, Estimated: 47 mL/min — ABNORMAL LOW (ref >60)
Glucose, Bld: 114 mg/dL — ABNORMAL HIGH (ref 70–99)
Potassium: 4.5 mmol/L (ref 3.5–5.1)
Sodium: 142 mmol/L (ref 135–145)

## 2020-06-06 LAB — CBC WITH DIFFERENTIAL/PLATELET
Abs Immature Granulocytes: 0.15 10*3/uL — ABNORMAL HIGH (ref 0.00–0.07)
Basophils Absolute: 0 10*3/uL (ref 0.0–0.1)
Basophils Relative: 0 %
Eosinophils Absolute: 0 10*3/uL (ref 0.0–0.5)
Eosinophils Relative: 0 %
HCT: 32.1 % — ABNORMAL LOW (ref 39.0–52.0)
Hemoglobin: 9.8 g/dL — ABNORMAL LOW (ref 13.0–17.0)
Immature Granulocytes: 1 %
Lymphocytes Relative: 1 %
Lymphs Abs: 0.1 10*3/uL — ABNORMAL LOW (ref 0.7–4.0)
MCH: 29.3 pg (ref 26.0–34.0)
MCHC: 30.5 g/dL (ref 30.0–36.0)
MCV: 96.1 fL (ref 80.0–100.0)
Monocytes Absolute: 0.4 10*3/uL (ref 0.1–1.0)
Monocytes Relative: 3 %
Neutro Abs: 13.2 10*3/uL — ABNORMAL HIGH (ref 1.7–7.7)
Neutrophils Relative %: 95 %
Platelets: 188 10*3/uL (ref 150–400)
RBC: 3.34 MIL/uL — ABNORMAL LOW (ref 4.22–5.81)
RDW: 16.7 % — ABNORMAL HIGH (ref 11.5–15.5)
WBC: 13.9 10*3/uL — ABNORMAL HIGH (ref 4.0–10.5)
nRBC: 0 % (ref 0.0–0.2)

## 2020-06-06 LAB — PROTIME-INR
INR: 1.9 — ABNORMAL HIGH (ref 0.8–1.2)
Prothrombin Time: 21.4 seconds — ABNORMAL HIGH (ref 11.4–15.2)

## 2020-06-06 LAB — D-DIMER, QUANTITATIVE: D-Dimer, Quant: 8.13 ug/mL-FEU — ABNORMAL HIGH (ref 0.00–0.50)

## 2020-06-06 LAB — LACTIC ACID, PLASMA
Lactic Acid, Venous: 2.4 mmol/L (ref 0.5–1.9)
Lactic Acid, Venous: 2 mmol/L (ref 0.5–1.9)

## 2020-06-06 LAB — PROCALCITONIN: Procalcitonin: 0.19 ng/mL

## 2020-06-06 LAB — C-REACTIVE PROTEIN: CRP: 0.9 mg/dL (ref <1.0)

## 2020-06-06 MED ORDER — FUROSEMIDE 10 MG/ML IJ SOLN
40.0000 mg | Freq: Once | INTRAMUSCULAR | Status: AC
  Administered 2020-06-06: 40 mg via INTRAVENOUS
  Filled 2020-06-06: qty 4

## 2020-06-06 MED ORDER — HEPARIN (PORCINE) 25000 UT/250ML-% IV SOLN
1650.0000 [IU]/h | INTRAVENOUS | Status: DC
  Administered 2020-06-06: 1400 [IU]/h via INTRAVENOUS
  Administered 2020-06-07: 1650 [IU]/h via INTRAVENOUS
  Filled 2020-06-06 (×2): qty 250

## 2020-06-06 MED ORDER — PIPERACILLIN-TAZOBACTAM 3.375 G IVPB
3.3750 g | Freq: Three times a day (TID) | INTRAVENOUS | Status: DC
  Administered 2020-06-06 – 2020-06-08 (×6): 3.375 g via INTRAVENOUS
  Filled 2020-06-06 (×7): qty 50

## 2020-06-06 NOTE — Progress Notes (Signed)
Pharmacy Antibiotic Note  Nathan [name] is a 80 y.o. male admitted on 05/29/2020 with COVID pneumonia. CXR today shows worsened multifocal pneumonia. Pharmacy has been consulted for Zosyn dosing.  Plan: Zosyn 3.375g IV q8h (each dose infused over 4 hours) Monitor renal function, cultures, clinical course  Height: 5\' 9"  (175.3 cm) Weight: 87 kg (191 lb 12.8 oz) IBW/kg (Calculated) : 70.7  Temp (24hrs), Avg:97.7 F (36.5 C), Min:97.5 F (36.4 C), Max:98 F (36.7 C)  Recent Labs  Lab 06/01/20 0350 06/02/20 0422 06/03/20 0349 06/05/20 0327 06/06/20 0206  WBC 7.7 8.5 7.3 12.9* 13.9*  CREATININE 1.53* 1.54* 1.42* 1.45* 1.48*    Estimated Creatinine Clearance: 42 mL/min (A) (by C-G formula based on SCr of 1.48 mg/dL (H)).    Allergies  Allergen Reactions  . Tramadol Nausea And Vomiting    Antimicrobials this admission:  2/11 Vancomycin x 1 2/11 Cefepime x 1 2/11 Remdesivir >> 2/15 2/12 Ceftriaxone >> 2/14 2/12 Baricitinib >> 2/16  2/14 Amoxicillin >> 2/16 2/19 Zosyn >>  Microbiology results:  2/11 BCx: NGF 2/11 UCx: > 100k E. faecalis (pan-sensitive)   Thank you for allowing pharmacy to be a part of this patient's care.   Lindell Spar, PharmD, BCPS Clinical Pharmacist  06/06/2020 2:21 PM

## 2020-06-06 NOTE — Progress Notes (Signed)
ANTICOAGULATION CONSULT NOTE - Initial Consult  Pharmacy Consult for IV heparin  Indication: concern for PE  Allergies  Allergen Reactions  . Tramadol Nausea And Vomiting    Patient Measurements: Height: 5\' 9"  (175.3 cm) Weight: 87 kg (191 lb 12.8 oz) IBW/kg (Calculated) : 70.7 Heparin Dosing Weight: actual body weight   Vital Signs: Temp: 98 F (36.7 C) (02/19 1344) BP: 134/79 (02/19 1344) Pulse Rate: 57 (02/19 1344)  Labs: Recent Labs    06/05/20 0327 06/06/20 0206  HGB 9.3* 9.8*  HCT 29.4* 32.1*  PLT 270 188  LABPROT 35.5* 21.4*  INR 3.7* 1.9*  CREATININE 1.45* 1.48*    Estimated Creatinine Clearance: 42 mL/min (A) (by C-G formula based on SCr of 1.48 mg/dL (H)).   Medical History: Past Medical History:  Diagnosis Date  . Cancer ([place])    skin  . Chronic renal impairment   . Diverticulosis   . DJD (degenerative joint disease)   . Elevated PSA    Dr Junious Silk  . H/O hiatal hernia   . Hypertension   . 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 Permanent atrial fibrillation ([place])   . PONV (postoperative nausea and vomiting)    no  problems recently    Assessment: 40 y/oM on warfarin PTA for atrial fibrillation admitted on 05/29/2020 with COVID pneumonia. On admission, INR significantly elevated at 8.8. Vitamin K 1mg  IV given on 2/11 and 5mg  IV given on 2/17. Anticoagulation was to be stopped indefinitely due to high risk of fall. Plan was for patient to have right reverse shoulder arthroplasty and hardware removal on 2/18, but INR remained elevated at 3.7, so surgery rescheduled for 2/21. Today, patient with worsening SOB. CXR shows worsened multifocal pneumonia. D-dimer 0.85 >> 8.13. VQ scan ordered. Pharmacy consulted to start heparin infusion due to concern for PE. INR 1.9 today.   Goal of Therapy:  Heparin level 0.3-0.7 units/ml Monitor platelets by anticoagulation protocol: Yes   Plan:  No initial  bolus per discussion with MD Start heparin infusion at 1400 units/hr Heparin level 8 hours after initiation Daily heparin level, CBC Monitor closely for s/sx of bleeding   Lindell Spar, PharmD, BCPS Clinical Pharmacist  06/06/2020,3:13 PM

## 2020-06-06 NOTE — Progress Notes (Signed)
Received a call from patient's primary nurse that patient suddenly went into respiratory distress requiring 15 L of high flow oxygen and nonrebreather.  RRT was called.  I ordered stat chest x-ray.  This shows worsening infiltrates.  Unsure whether this is progression of the COVID-19 pneumonia versus superimposed bacterial pneumonia.  Patient has been having worsening leukocytosis but he is afebrile.  This morning he did have some rhonchi but he was comfortable and more alert compared to yesterday.  This was a sudden change.  This also raises possibility of PE since we have stopped his Coumadin and his INR is subtherapeutic.  D-dimer was checked which jumped 10 times from few days ago.  This raises high suspicion for possible PE.  Due to chronic kidney disease, I am unable to proceed with CT angiogram.  We will tentatively start him on heparin drip with pharmacy to consult without bolus.  I have ordered VQ scan.  I also had discussion with patient's son Nathan [name] about CODE STATUS.  After having a frank discussion with his siblings, he returned the phone call and made the decision to make his father DNR stating that his father would not want to live like a vegetable and would not want intubation.  At this point in time, we will try our best to help Nathan [name] recover however prognosis seems to be poor at least based on the current circumstances.  I did inform his son about possibility of transitioning to hospice/comfort care if patient were not to improve in next 24 hours.  He will have more conversation with his siblings.  I will also consult palliative care.

## 2020-06-06 NOTE — Progress Notes (Signed)
Writer has called patient's son and updated on pt condition.

## 2020-06-06 NOTE — Progress Notes (Signed)
Lactic acid of 2.4. Dr. Doristine Bosworth paged.

## 2020-06-06 NOTE — Progress Notes (Signed)
Pt states he is feeling better.  IV team here for IV. RR here. MD returned page and is ordering labs and stat chest x-ray.

## 2020-06-06 NOTE — Progress Notes (Signed)
MD paged. RR here.

## 2020-06-06 NOTE — Progress Notes (Signed)
Have alerted MD via secure chat and RR on way to room. Will now page MD.

## 2020-06-06 NOTE — Progress Notes (Signed)
Chest xray and labs done.Pt lying in bed with NRB and HFNC. At moment, pt states he is "breathing better". Monitoring closely.

## 2020-06-06 NOTE — Progress Notes (Signed)
Noted pt to have difficulty breathing. Pt stating he is "having trouble breathing". Checked O2 sats on 6 L/min O2 via HFNC and sats 74%. Increased HFNC to 15 O2 sats 79%.   Starting NRB.  Alerting MD.

## 2020-06-06 NOTE — Progress Notes (Signed)
PROGRESS NOTE  Nathan [name] [id number]  DOB: 05/30/1940  PCP: Janith Lima, MD VQQ:[id number]  DOA: 05/29/2020  LOS: 8 days   Chief Complaint  Patient presents with  . Altered Mental Status   Brief narrative: Nathan [name] is a 80 y.o. male with PMH significant for HTN, CKD 3B, chronic A. fib on Coumadin, left ventricle hypertrophy, thoracic aortic aneurysm, BPH, gout who lives at [facility]. Patient was brought to the ED on 2/11 for progressively worsening confusion, hypotension and hypoxia. Patient was recently hospitalized from 1/23-1/28 after a fall resulting in a fracture of the right humerus for which he underwent ORIF on 1/25 by Dr. Percell Miller. Of note, patient tested positive for Covid when checked prior to discharge to SNF. Per report, at the facility, for last 2 to 3 days, patient had low oral intake, progressively worsening confusion and also shortness of breath. SNF staff noted his blood pressure to be low at 70/40 and hence EMS was called. EMS noted blood pressure low at 68/40, O2 sat was 80% on room air. He was given 500 cc of normal saline, placed on 4 L oxygen by nasal cannula and brought to the ED.  In the ED, Chest x-ray revealed bilateral patchy infiltrates consistent with Covid pneumonia.   Inflammatory markers were elevated. Patient was admitted for COVID pneumonia.  Patient improved on a standard treatment for Covid pneumonia. Family made a choice not to return him back to SNF but rather take home. He was arranged for discharge today 2/17 with home health and DME's. However a repeat x-ray of right shoulder was done and reviewed by orthopedics. X-ray shows migration of orthopedic hardware with the proximal aspect of the intramedullary rod and the screws no longer seated on the humeral head fracture fragment. Orthopedic recommended repeat surgery. Discharge is held for that reason.  Subjective: Patient seen and examined.  He is fully alert and much  more oriented than yesterday.  He is oriented to place person and time today.  He has no complaint.  Assessment/Plan: COVID pneumonia Acute respiratory failure with hypoxia   -Presented with worsening shortness of breath, lethargy -COVID test: PCR positive on 1/27 -Chest imaging: Chest x-ray on admission showed new bilateral airspace lung opacities consistent with multifocal pneumonia -Patient completed 5-day course of IV remdesivir, currently on Solu-Medrol 40 mg IV twice daily. He also received baricitinib 1 mg daily for last 1 week which was eventually discontinued. -Oxygen - SpO2: (!) 86 % O2 Flow Rate (L/min): 4 L/min -Supportive care: Vitamin C, Zinc, PRN inhalers, Tylenol, Antitussives (benzonatate/ Mucinex/Tussionex).   -Encouraged incentive spirometry, prone position, out of bed and early mobilization as much as possible -Continue airborne/contact isolation precautions until 06/08/2020 per ID. Leukocytosis resolved and inflammatory markers trend as below. Recent Labs  Lab 05/31/20 0500 05/31/20 0737 06/01/20 0350 06/02/20 0422 06/03/20 0349 06/05/20 0327 06/06/20 0206  WBC 7.9  --  7.7 8.5 7.3 12.9* 13.9*  PROCALCITON 0.21  --  0.13  --   --   --   --   DDIMER 1.67*  --  1.15* 0.91* 0.85*  --   --   FERRITIN  --  348*  --   --   --   --   --   CRP  --  9.3* 4.9* 3.2* 2.3*  --   --   ALT 15  --  15 20 20   --   --    Abnormal urinalysis -Urinalysis with hazy urine,  leukocytes and bacteria probably because of dehydration.  AKI on CKD 3B -Creatinine was 1.89 on last discharge, presented with a creatinine elevated 2.29.  Improving with IV fluid, creatinine is down to 1.42 on last check on 2/16..  Encourage oral hydration.  Creatinine back to baseline. Recent Labs    05/14/20 0812 05/15/20 0318 05/29/20 1734 05/30/20 0508 05/31/20 0500 06/01/20 0350 06/02/20 0422 06/03/20 0349 06/05/20 0327 06/06/20 0206  BUN 49* 50* 72* 69* 75* 78* 82* 83* 81* 78*  CREATININE 1.96*  1.89* 2.29* 2.06* 1.74* 1.53* 1.54* 1.42* 1.45* 1.48*   Acute hypernatremia -Due to poor oral intake.   -Sodium level improved with half-normal saline. Encourage oral intake.  Currently not on IV fluid Recent Labs  Lab 05/31/20 0500 06/01/20 0350 06/02/20 0422 06/03/20 0349 06/05/20 0327 06/06/20 0206  NA 147* 144 142 145 143 142   Lactic acidosis Non-anion gap metabolic acidosis -Probably secondary to decreased tissue perfusion from dehydration and also anaerobic respiration due to COVID pneumonia.  Acute metabolic encephalopathy Mild chronic cognitive dysfunction -Acute change in mental status because of dehydration, infection. -Patient also has mild chronic cognitive dysfunction.  He is at his baseline.  Continue to monitor  Essential hypertension -Continue to monitor on Cardizem, metoprolol.   -On last hospitalization, lisinopril was stopped and Lasix was reduced.   -Blood pressure stable at this time.  Elevated INR -INR was elevated for unclear reason.  Gradually improving, 6.2 on last check on 2/16. -For several years, patient was on long-term anticoagulation with Coumadin for A. fib.   -Patient is at significant risk of fall and fatal bleeding. Discussed with family. We will not resume Coumadin at this time.  INR 1.9 today. Recent Labs  Lab 06/01/20 0350 06/02/20 0422 06/03/20 0349 06/05/20 0327 06/06/20 0206  INR 10.1* 7.9* 6.2* 3.7* 1.9*   Afib -On metoprolol and Cardizem.  Rates controlled.  Anticoagulation is stopped indefinitely due to high risk of fall.  Acute on chronic anemia -Baseline hemoglobin above 10.  No active bleeding.  Hemoglobin remains at baseline. -Continue to monitor.   Recent Labs    05/31/20 0500 05/31/20 0737 06/01/20 0350 06/02/20 0422 06/03/20 0349 06/05/20 0327 06/06/20 0206  HGB 9.6*  --  8.9* 10.1* 9.1* 9.3* 9.8*  MCV 99.4  --  95.4 94.3 98.4 93.9 96.1  VITAMINB12  --  492  --   --   --   --   --   FOLATE  --  20.0  --    --   --   --   --   FERRITIN  --  348*  --   --   --   --   --   TIBC  --  142*  --   --   --   --   --   IRON  --  51  --   --   --   --   --   RETICCTPCT 1.7  --   --   --   --   --   --    Recent comminuted fracture of right humerus -After mechanical fall. -1/25, underwent ORIF of right humerus by Dr. Percell Miller. -Repeat x-ray obtained on 2/16 which showed migration of the orthopedic hardware. Repeat surgery recommended by orthopedics.  He was scheduled to have surgery on 06/05/2020 however unfortunately his INR still is 3.5.  Surgery is requesting INR to be 1.5 or less.  Vitamin K would not make any difference today.  I  discussed with orthopedic and requested to defer surgery for few hours so we can perhaps provide him with a few units of FFP's in an effort to bring the INR down however I was informed that they will not be able to do surgery later that day and not even on weekend.  They have rescheduled his surgery for Monday.  INR is coming down. Continue current pain management.  Impaired mobility -Prior to last admission, patient was using a walker at home. -Discharged to SNF on last admission.  Seen by PT.  Limited mobility because of walker dependence but inability to bear weight on the right upper extremity due to fracture  Mild protein calorie malnutrition Hypoalbuminemia -Albumin level low at 1.9.  Secondary to poor appetite, poor p.o. intake -Nutrition consult  Gout -Continuing home regimen of allopurinol  Mobility: PT eval required post procedure Code Status:   Code Status: Full Code  Nutritional status: Body mass index is 28.32 kg/m. Nutrition Problem: Inadequate oral intake Etiology: poor appetite Signs/Symptoms: meal completion < 25%,per patient/family report Diet Order            Diet Heart Room service appropriate? Yes; Fluid consistency: Thin  Diet effective now                 DVT prophylaxis: SCDs Start: 05/29/20 2011   Antimicrobials:  None Fluid:  Not on IV fluid Consultants: None Family Communication:  Updated his son over the phone yesterday.  Status is: Inpatient  Remains inpatient appropriate because: Needs repeat surgical fixation of right humerus fracture  Dispo: The patient is from: SNF              Anticipated d/c is to: Family was willing to take home with DMEs, hospital bed. Plan may change post procedure              Anticipated d/c date is: Anticipate 06/09/2020              Patient currently is not medically stable to d/c.   Difficult to place patient No  Infusions:    Scheduled Meds: . acetaminophen  1,000 mg Oral Once  . allopurinol  100 mg Oral Daily  . vitamin C  500 mg Oral Daily  . benzoyl peroxide   Topical BID  . Chlorhexidine Gluconate Cloth  6 each Topical Q0600  . diltiazem  120 mg Oral Daily  . feeding supplement  237 mL Oral TID BM  . finasteride  5 mg Oral Daily  . methylPREDNISolone (SOLU-MEDROL) injection  40 mg Intravenous Q12H  . metoprolol tartrate  50 mg Oral BID  . multivitamin with minerals  1 tablet Oral Daily  . sodium chloride flush  10-40 mL Intracatheter Q12H  . zinc sulfate  220 mg Oral Daily    Antimicrobials: Anti-infectives (From admission, onward)   Start     Dose/Rate Route Frequency Ordered Stop   06/05/20 1230  ceFAZolin (ANCEF) IVPB 2g/100 mL premix        2 g 200 mL/hr over 30 Minutes Intravenous On call to O.R. 06/04/20 1341 06/06/20 0559   06/01/20 0900  amoxicillin (AMOXIL) capsule 500 mg  Status:  Discontinued        500 mg Oral Every 8 hours 06/01/20 0858 06/03/20 1544   05/30/20 1000  remdesivir 100 mg in sodium chloride 0.9 % 100 mL IVPB       "Followed by" Linked Group Details   100 mg 200 mL/hr over 30 Minutes Intravenous Daily  05/29/20 2010 06/02/20 1151   05/30/20 0200  cefTRIAXone (ROCEPHIN) 1 g in sodium chloride 0.9 % 100 mL IVPB  Status:  Discontinued        1 g 200 mL/hr over 30 Minutes Intravenous Every 24 hours 05/29/20 2108 06/01/20 0857    05/29/20 2200  remdesivir 200 mg in sodium chloride 0.9% 250 mL IVPB       "Followed by" Linked Group Details   200 mg 580 mL/hr over 30 Minutes Intravenous Once 05/29/20 2010 05/29/20 2317   05/29/20 1845  vancomycin (VANCOREADY) IVPB 1750 mg/350 mL        1,750 mg 175 mL/hr over 120 Minutes Intravenous  Once 05/29/20 1835 05/29/20 2200   05/29/20 1830  vancomycin (VANCOCIN) IVPB 1000 mg/200 mL premix  Status:  Discontinued        1,000 mg 200 mL/hr over 60 Minutes Intravenous  Once 05/29/20 1826 05/29/20 1835   05/29/20 1830  ceFEPIme (MAXIPIME) 2 g in sodium chloride 0.9 % 100 mL IVPB        2 g 200 mL/hr over 30 Minutes Intravenous  Once 05/29/20 1826 05/29/20 1959      PRN meds: acetaminophen, albuterol, diphenhydrAMINE-zinc acetate, guaiFENesin-dextromethorphan, loperamide, melatonin, ondansetron **OR** ondansetron (ZOFRAN) IV, sodium chloride flush   Objective: Vitals:   06/05/20 2300 06/06/20 0431  BP:  (!) 157/116  Pulse:  64  Resp:  20  Temp:  97.6 F (36.4 C)  SpO2: 92% (!) 86%    Intake/Output Summary (Last 24 hours) at 06/06/2020 1111 Last data filed at 06/06/2020 0700 Gross per 24 hour  Intake 120 ml  Output 600 ml  Net -480 ml   Filed Weights   05/29/20 1802 05/30/20 0224 06/01/20 0352  Weight: 94.8 kg 83.7 kg 87 kg   Weight change:  Body mass index is 28.32 kg/m.   Physical Exam:  General exam: Appears calm and comfortable  Respiratory system: Clear to auscultation. Respiratory effort normal. Cardiovascular system: S1 & S2 heard, RRR. No JVD, murmurs, rubs, gallops or clicks. No pedal edema. Gastrointestinal system: Abdomen is nondistended, soft and nontender. No organomegaly or masses felt. Normal bowel sounds heard. Central nervous system: Alert and oriented. No focal neurological deficits. Extremities: Symmetric 5 x 5 power. Skin: No rashes, lesions or ulcers.  Psychiatry: Judgement and insight appear poor  Data Review: I have personally  reviewed the laboratory data and studies available.  Recent Labs  Lab 06/01/20 0350 06/02/20 0422 06/03/20 0349 06/05/20 0327 06/06/20 0206  WBC 7.7 8.5 7.3 12.9* 13.9*  NEUTROABS 7.3 8.0* 6.8 12.2* 13.2*  HGB 8.9* 10.1* 9.1* 9.3* 9.8*  HCT 28.8* 33.0* 31.0* 29.4* 32.1*  MCV 95.4 94.3 98.4 93.9 96.1  PLT 355 344 279 270 188   Recent Labs  Lab 05/31/20 0500 06/01/20 0350 06/02/20 0422 06/03/20 0349 06/05/20 0327 06/06/20 0206  NA 147* 144 142 145 143 142  K 3.8 3.8 4.5 4.3 4.1 4.5  CL 117* 117* 113* 118* 118* 117*  CO2 17* 14* 17* 16* 16* 16*  GLUCOSE 125* 116* 112* 103* 117* 114*  BUN 75* 78* 82* 83* 81* 78*  CREATININE 1.74* 1.53* 1.54* 1.42* 1.45* 1.48*  CALCIUM 8.0* 7.5* 7.8* 7.6* 7.5* 7.7*  MG 2.2 2.2 2.3 2.4  --   --     F/u labs ordered Unresulted Labs (From admission, onward)          Start     Ordered   06/05/20 8341  Basic metabolic panel  Daily,   R     Question:  Specimen collection method  Answer:  IV Team=IV Team collect   06/04/20 1107   06/05/20 0500  CBC with Differential/Platelet  Daily,   R     Question:  Specimen collection method  Answer:  IV Team=IV Team collect   06/04/20 1107   06/05/20 0500  Protime-INR  Daily,   R     Question:  Specimen collection method  Answer:  IV Team=IV Team collect   06/04/20 1107         Signed, Darliss Cheney, MD [facility] 06/06/2020

## 2020-06-07 DIAGNOSIS — I482 Chronic atrial fibrillation, unspecified: Secondary | ICD-10-CM | POA: Diagnosis not present

## 2020-06-07 DIAGNOSIS — N189 Chronic kidney disease, unspecified: Secondary | ICD-10-CM

## 2020-06-07 DIAGNOSIS — Z515 Encounter for palliative care: Secondary | ICD-10-CM

## 2020-06-07 DIAGNOSIS — Z7189 Other specified counseling: Secondary | ICD-10-CM

## 2020-06-07 DIAGNOSIS — N179 Acute kidney failure, unspecified: Secondary | ICD-10-CM | POA: Diagnosis not present

## 2020-06-07 DIAGNOSIS — U071 COVID-19: Secondary | ICD-10-CM | POA: Diagnosis not present

## 2020-06-07 DIAGNOSIS — J189 Pneumonia, unspecified organism: Secondary | ICD-10-CM

## 2020-06-07 DIAGNOSIS — Z66 Do not resuscitate: Secondary | ICD-10-CM

## 2020-06-07 DIAGNOSIS — J9601 Acute respiratory failure with hypoxia: Secondary | ICD-10-CM | POA: Diagnosis not present

## 2020-06-07 LAB — PROTIME-INR
INR: 2.1 — ABNORMAL HIGH (ref 0.8–1.2)
Prothrombin Time: 22.7 seconds — ABNORMAL HIGH (ref 11.4–15.2)

## 2020-06-07 LAB — CBC WITH DIFFERENTIAL/PLATELET
Abs Immature Granulocytes: 0.18 10*3/uL — ABNORMAL HIGH (ref 0.00–0.07)
Basophils Absolute: 0 10*3/uL (ref 0.0–0.1)
Basophils Relative: 0 %
Eosinophils Absolute: 0 10*3/uL (ref 0.0–0.5)
Eosinophils Relative: 0 %
HCT: 31.8 % — ABNORMAL LOW (ref 39.0–52.0)
Hemoglobin: 9.9 g/dL — ABNORMAL LOW (ref 13.0–17.0)
Immature Granulocytes: 1 %
Lymphocytes Relative: 1 %
Lymphs Abs: 0.1 10*3/uL — ABNORMAL LOW (ref 0.7–4.0)
MCH: 29.9 pg (ref 26.0–34.0)
MCHC: 31.1 g/dL (ref 30.0–36.0)
MCV: 96.1 fL (ref 80.0–100.0)
Monocytes Absolute: 0.8 10*3/uL (ref 0.1–1.0)
Monocytes Relative: 4 %
Neutro Abs: 19.9 10*3/uL — ABNORMAL HIGH (ref 1.7–7.7)
Neutrophils Relative %: 94 %
Platelets: 158 10*3/uL (ref 150–400)
RBC: 3.31 MIL/uL — ABNORMAL LOW (ref 4.22–5.81)
RDW: 17 % — ABNORMAL HIGH (ref 11.5–15.5)
WBC: 21 10*3/uL — ABNORMAL HIGH (ref 4.0–10.5)
nRBC: 0 % (ref 0.0–0.2)

## 2020-06-07 LAB — HEPARIN LEVEL (UNFRACTIONATED)
Heparin Unfractionated: <0.1 IU/mL — ABNORMAL LOW (ref 0.30–0.70)
Heparin Unfractionated: <0.1 IU/mL — ABNORMAL LOW (ref 0.30–0.70)
Heparin Unfractionated: <0.1 IU/mL — ABNORMAL LOW (ref 0.30–0.70)

## 2020-06-07 LAB — LACTIC ACID, PLASMA
Lactic Acid, Venous: 3 mmol/L (ref 0.5–1.9)
Lactic Acid, Venous: 3.3 mmol/L (ref 0.5–1.9)
Lactic Acid, Venous: 2.4 mmol/L (ref 0.5–1.9)
Lactic Acid, Venous: 2.5 mmol/L (ref 0.5–1.9)

## 2020-06-07 LAB — BASIC METABOLIC PANEL
Anion gap: 10 (ref 5–15)
BUN: 80 mg/dL — ABNORMAL HIGH (ref 8–23)
CO2: 16 mmol/L — ABNORMAL LOW (ref 22–32)
Calcium: 7.6 mg/dL — ABNORMAL LOW (ref 8.9–10.3)
Chloride: 115 mmol/L — ABNORMAL HIGH (ref 98–111)
Creatinine, Ser: 1.55 mg/dL — ABNORMAL HIGH (ref 0.61–1.24)
GFR, Estimated: 44 mL/min — ABNORMAL LOW (ref >60)
Glucose, Bld: 126 mg/dL — ABNORMAL HIGH (ref 70–99)
Potassium: 4.3 mmol/L (ref 3.5–5.1)
Sodium: 141 mmol/L (ref 135–145)

## 2020-06-07 MED ORDER — FUROSEMIDE 10 MG/ML IJ SOLN
40.0000 mg | Freq: Once | INTRAMUSCULAR | Status: AC
  Administered 2020-06-07: 40 mg via INTRAVENOUS
  Filled 2020-06-07: qty 4

## 2020-06-07 MED ORDER — LIP MEDEX EX OINT
TOPICAL_OINTMENT | CUTANEOUS | Status: AC
  Filled 2020-06-07: qty 7

## 2020-06-07 MED ORDER — HEPARIN (PORCINE) 25000 UT/250ML-% IV SOLN
2050.0000 [IU]/h | INTRAVENOUS | Status: AC
  Filled 2020-06-07: qty 250

## 2020-06-07 MED ORDER — HEPARIN (PORCINE) 25000 UT/250ML-% IV SOLN
1850.0000 [IU]/h | INTRAVENOUS | Status: DC
  Administered 2020-06-07: 1850 [IU]/h via INTRAVENOUS
  Filled 2020-06-07: qty 250

## 2020-06-07 MED ORDER — VITAMIN K1 10 MG/ML IJ SOLN
5.0000 mg | Freq: Once | INTRAVENOUS | Status: AC
  Administered 2020-06-07: 5 mg via INTRAVENOUS
  Filled 2020-06-07: qty 0.5

## 2020-06-07 NOTE — Progress Notes (Signed)
ANTICOAGULATION CONSULT NOTE - Follow Up Consult  Pharmacy Consult for IV heparin  Indication: concern for PE  Allergies  Allergen Reactions  . Tramadol Nausea And Vomiting    Patient Measurements: Height: 5\' 9"  (175.3 cm) Weight: 87 kg (191 lb 12.8 oz) IBW/kg (Calculated) : 70.7 Heparin Dosing Weight: actual body weight   Vital Signs: BP: 113/85 (02/20 2019) Pulse Rate: 68 (02/20 2100)  Labs: Recent Labs    06/05/20 0327 06/06/20 0206 06/07/20 0027 06/07/20 0935 06/07/20 1910  HGB 9.3* 9.8* 9.9*  --   --   HCT 29.4* 32.1* 31.8*  --   --   PLT 270 188 158  --   --   LABPROT 35.5* 21.4* 22.7*  --   --   INR 3.7* 1.9* 2.1*  --   --   HEPARINUNFRC  --   --  <0.10* <0.10* <0.10*  CREATININE 1.45* 1.48* 1.55*  --   --     Estimated Creatinine Clearance: 40.1 mL/min (A) (by C-G formula based on SCr of 1.55 mg/dL (H)).   Assessment: 10 y/oM on warfarin PTA for atrial fibrillation admitted on 05/29/2020 with COVID pneumonia. On admission, INR significantly elevated at 8.8. Vitamin K 1mg  IV given on 2/11 and 5mg  IV given on 2/17. Anticoagulation was to be stopped indefinitely due to high risk of fall. Plan was for patient to have right reverse shoulder arthroplasty and hardware removal on 2/18, but INR remained elevated at 3.7, so surgery rescheduled for 2/21. Today, patient with worsening SOB. CXR shows worsened multifocal pneumonia. D-dimer 0.85 >> 8.13. VQ scan ordered. Pharmacy consulted to start heparin infusion due to concern for PE. INR 1.9 today.    06/07/20  Heparin level < 0.1 again despite increased rate to 1850 units/hr  Hgb 9.9 (stable), PLTC 158 K  INR = 2.1 (no warfarin since admission; see above for INR trends and Vit K administration) ==> received Vitamin K 5mg  IV x 1 @ 12:47  Per RN, heparin gtt has been infusing with no interruptions  No complications of therapy noted  Goal of Therapy:  Heparin level 0.3-0.7 units/ml Monitor platelets by  anticoagulation protocol: Yes   Plan:  Increase heparin infusion to 2050 units/hr Heparin level 8 hours after rate increase Daily heparin level, CBC Monitor closely for s/sx of bleeding Will also stop IV heparin at 09:00 tomorrow for [place], PharmD, BCPS Pharmacy: [phone] 06/07/2020,10:51 PM

## 2020-06-07 NOTE — Consult Note (Signed)
Consultation Note Date: 06/07/2020   Patient Name: Nathan [name]  DOB: 1940/05/02  MRN: [id number]  Age / Sex: 80 y.o., male   PCP: Janith Lima, MD Referring Physician: Darliss Cheney, MD   REASON FOR CONSULTATION:Establishing goals of care  Palliative Care consult requested for goals of care discussion in this 80 y.o. male with multiple medical problems including chronic kidney disease stage IIIb, left ventricular hypertrophy, chronic atrial fibrillation (Coumadin), hypertension, gout, benign prostatic hyperplasia, and thoracic aortic aneurysm. Patient presented to the ED from [facility] facility with complaints of increased confusion, hypotension, and hypoxia. Patient was recently hospitalized after falling at home requiring right hip ORIF. He tested positive for COVID incidentally at the time of discharge. Since recent admission chest x-ray showed bilateral patchy infiltrates consistent with COVID pneumonia. INR 8.8. he is receiving IV antibiotics. Patient was scheduled for discharge on 2/17 and subsequently repeat right shoulder x-ray showed migration of orthopedic hardware (rod/screws). He is being followed by Orthopedic with recommendations for repeat surgery once INR stabilized.   Clinical Assessment and Goals of Care: I have reviewed medical records including lab results, imaging, Epic notes, and MAR. I spoke with patient's son, Daneil Beem to discuss diagnosis prognosis, [place], EOL wishes, disposition and options.  I introduced Palliative Medicine as specialized medical care for people living with serious illness. It focuses on providing relief from the symptoms and stress of a serious illness. The goal is to improve quality of life for both the patient and the family. Son verbalized understanding and appreciation.   We discussed a brief life review of the patient, along with his functional and nutritional status. 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 He is  divorced however he and his significant other, Stanton Kidney have been involved for over 20 years. Patient has 4 children who are closely involved in his care.   Prior to recent illness patient was living in the home alone performing ADLs. Son repots his health was starting to decline and family was in the process of considering other options knowing he could not continue to live alone, however he suffered a fall which sent him to [facility] facility for rehab. Son reports patient has continued to decline since last admission and placement at facility. His appetite was poor followed by respiratory distress.  We discussed His current illness and what it means in the larger context of His on-going co-morbidities. With specific discussions regarding patients respiratory failure, COVID, encephalopathy, and overall functional and nutritional state. Natural disease trajectory and expectations at EOL were discussed.  Mr. Lepore emotionally shared his and his siblings understanding of patient's current illness and co-morbidities. He states they do not wish for patient to suffer and knows that his time is limited.   I attempted to elicit values and goals of care important to the patient.    The difference between aggressive medical intervention and comfort care was considered in light of the patient's goals of care. We discussed best case and worst case scenarios. Education provided on what comfort care measures would look like. Son verbalized understanding and appreciation.   Advanced directives, concepts specific to code status, artifical feeding and hydration, and rehospitalization were considered and discussed. Patient does not have a documented advanced directive however have had previous discussions with his children.   Mr. Ealy confirms DNR/DNI, no artificial feedings, dialysis, or aggressive interventions. He also shares given patient's tenuous state family would not wish for him to undergo any surgical  procedures with awareness of high risk of death. He verbalizes family are all in mutual agreement.   Son is clear in expressing family's wishes to continue to treat the treatable and take it day by day. If patient does not show improvement or further declines family would wish to focus on patient's comfort/end-of-life. They do not wish for him to suffer.   Hospice and Palliative Care services outpatient were explained and offered. Son verbalized understanding and awareness of both palliative and hospice's goals and philosophy of care. He is not prepared to make any final decisions until family see how patient does over the next 24-48hrs.   Questions and concerns were addressed. The family was encouraged to call with questions or concerns.  PMT will continue to support holistically.   SOCIAL HISTORY:     reports that he has never smoked. He has never used smokeless tobacco. He reports that he does not drink alcohol and does not use drugs.  CODE STATUS: DNR  ADVANCE DIRECTIVES: Primary Decision Maker: Next of Kin   SYMPTOM MANAGEMENT: per attending   Palliative Prophylaxis:   Aspiration, Bowel Regimen, Delirium Protocol, Eye Care, Frequent Pain Assessment, Oral Care, Palliative Wound Care and Turn Reposition  PSYCHO-SOCIAL/SPIRITUAL:  Support System: Family Desire for further Chaplaincy support:Not addressed  Additional Recommendations (Limitations, Scope, Preferences):  No Artificial Feeding, No Surgical Procedures and Treat the treatable, watchful waiting, DNR/DNI  Education on hospice/palliative    PAST MEDICAL HISTORY: Past Medical History:  Diagnosis Date  . Cancer ([place])    skin  . Chronic renal impairment   . Diverticulosis   . DJD (degenerative joint disease)   . Elevated PSA    Dr Junious Silk  . H/O hiatal hernia   . Hypertension   . 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 Permanent atrial fibrillation ([place])    . PONV (postoperative nausea and vomiting)    no  problems recently    ALLERGIES:  is allergic to tramadol.   MEDICATIONS:  Current Facility-Administered Medications  Medication Dose Route Frequency Provider Last Rate Last Admin  . acetaminophen (TYLENOL) tablet 1,000 mg  1,000 mg Oral Once Catalina Gravel, MD      . acetaminophen (TYLENOL) tablet 650 mg  650 mg Oral Q6H PRN Vernelle Emerald, MD   650 mg at 06/07/20 1713  . albuterol (VENTOLIN HFA) 108 (90 Base) MCG/ACT inhaler 2 puff  2 puff Inhalation Q4H PRN Shalhoub, Sherryll Burger, MD      . allopurinol (ZYLOPRIM) tablet 100 mg  100 mg Oral Daily Shalhoub, Sherryll Burger, MD   100 mg at 06/07/20 1012  . ascorbic acid (VITAMIN C) tablet 500 mg  500 mg Oral Daily Shalhoub, Sherryll Burger, MD   500 mg at 06/07/20 1012  . benzoyl peroxide 5 % gel   Topical BID Ethelda Chick, PA-C   Given at 06/07/20 1200  . Chlorhexidine Gluconate Cloth 2 % PADS 6 each  6 each Topical Q0600 Ethelda Chick, PA-C   6 each at 06/05/20 1610  . diltiazem (CARDIZEM CD) 24 hr capsule 120 mg  120 mg Oral Daily Shalhoub, Sherryll Burger, MD   120 mg at 06/07/20 1012  . diphenhydrAMINE-zinc acetate (BENADRYL) 2-0.1 % cream   Topical TID PRN Blount, Scarlette Shorts T, NP      . feeding supplement (ENSURE ENLIVE / ENSURE PLUS) liquid 237 mL  237 mL Oral TID BM Darliss Cheney, MD  237 mL at 06/07/20 1518  . finasteride (PROSCAR) tablet 5 mg  5 mg Oral Daily Shalhoub, Sherryll Burger, MD   5 mg at 06/07/20 1011  . guaiFENesin-dextromethorphan (ROBITUSSIN DM) 100-10 MG/5ML syrup 10 mL  10 mL Oral Q4H PRN Shalhoub, Sherryll Burger, MD   10 mL at 05/31/20 0952  . heparin ADULT infusion 100 units/mL (25000 units/225mL)  1,850 Units/hr Intravenous Continuous Darliss Cheney, MD 18.5 mL/hr at 06/07/20 1150 1,850 Units/hr at 06/07/20 1150  . loperamide (IMODIUM) capsule 2 mg  2 mg Oral PRN Dahal, Binaya, MD      . melatonin tablet 3 mg  3 mg Oral QHS PRN Shalhoub, Sherryll Burger, MD   3 mg at 06/06/20 2212  .  methylPREDNISolone sodium succinate (SOLU-MEDROL) 40 mg/mL injection 40 mg  40 mg Intravenous Q12H Dahal, Marlowe Aschoff, MD   40 mg at 06/07/20 1018  . metoprolol tartrate (LOPRESSOR) tablet 50 mg  50 mg Oral BID Vernelle Emerald, MD   50 mg at 06/07/20 1012  . multivitamin with minerals tablet 1 tablet  1 tablet Oral Daily Darliss Cheney, MD   1 tablet at 06/07/20 1012  . ondansetron (ZOFRAN) tablet 4 mg  4 mg Oral Q6H PRN Shalhoub, Sherryll Burger, MD       Or  . ondansetron [facility]) injection 4 mg  4 mg Intravenous Q6H PRN Shalhoub, Sherryll Burger, MD      . piperacillin-tazobactam (ZOSYN) IVPB 3.375 g  3.375 g Intravenous Q8H Luiz Ochoa, RPH 12.5 mL/hr at 06/07/20 1518 3.375 g at 06/07/20 1518  . sodium chloride flush (NS) 0.9 % injection 10-40 mL  10-40 mL Intracatheter Q12H Dahal, Marlowe Aschoff, MD   10 mL at 06/06/20 2232  . sodium chloride flush (NS) 0.9 % injection 10-40 mL  10-40 mL Intracatheter PRN Dahal, Marlowe Aschoff, MD      . zinc sulfate capsule 220 mg  220 mg Oral Daily Shalhoub, Sherryll Burger, MD   220 mg at 06/07/20 1012    VITAL SIGNS: BP 134/79 (BP Location: Left Arm)   Pulse (!) 57   Temp 98 F (36.7 C)   Resp 16   Ht 5\' 9"  (1.753 m)   Wt 87 kg   SpO2 98%   BMI 28.32 kg/m  Filed Weights   05/29/20 1802 05/30/20 0224 06/01/20 0352  Weight: 94.8 kg 83.7 kg 87 kg    Estimated body mass index is 28.32 kg/m as calculated from the following:   Height as of this encounter: 5\' 9"  (1.753 m).   Weight as of this encounter: 87 kg.  LABS: CBC:    Component Value Date/Time   WBC 21.0 (H) 06/07/2020 0027   HGB 9.9 (L) 06/07/2020 0027   HCT 31.8 (L) 06/07/2020 0027   PLT 158 06/07/2020 0027   Comprehensive Metabolic Panel:    Component Value Date/Time   NA 141 06/07/2020 0027   K 4.3 06/07/2020 0027   BUN 80 (H) 06/07/2020 0027   CREATININE 1.55 (H) 06/07/2020 0027   CREATININE 1.38 (H) 11/24/2015 1519   ALBUMIN 2.3 (L) 06/03/2020 0349     Review of Systems  Unable to perform ROS: Acuity  of condition   The above conversation was completed via telephone due to the visitor restrictions during the COVID-19 pandemic. Thorough chart review and discussion with necessary members of the care team was completed as part of assessment. All issues were discussed and addressed but no physical exam was performed.   Prognosis:Guarded-Poor   Discharge  Planning:  To Be Determined  Recommendations: . DNR/DNI-as confirmed by son . Continue with current plan of care  . No aggressive interventions, no artificial feedings, life-sustaining measures . Family have requested no surgical interventions at this time given patient's tenuous state and high risk of death.  . Discussed at length and education provided on comfort care, palliative, and hopsice. Family wishes to take it one day at a time. Focus is on patient's quality of life and no suffering. If no improvement/further decline their wishes would be to focus on Mr. Shukla comfort/EOL care.  Marland Kitchen PMT will continue to support and follow. Please call team line with urgent needs.   Palliative Performance Scale: PPS 20%              Son expressed understanding and was in agreement with this plan.   Thank you for allowing the Palliative Medicine Team to assist in the care of this patient.  Time In: 1430 Time Out: 1525 Time Total: 55 min.   Visit consisted of counseling and education dealing with the complex and emotionally intense issues of symptom management and palliative care in the setting of serious and potentially life-threatening illness.Greater than 50%  of this time was spent counseling and coordinating care related to the above assessment and plan.  Signed by:  Alda Lea, AGPCNP-BC Palliative Medicine Team  Phone: [phone] Pager: [phone] Amion: Bjorn Pippin

## 2020-06-07 NOTE — Progress Notes (Signed)
PROGRESS NOTE  Nathan [name] YCXKGY  DOB: [birth year]  PCP: Janith Lima, MD JEH:[id number]  DOA: 05/29/2020  LOS: 9 days   Chief Complaint  Patient presents with  . Altered Mental Status   Brief narrative: Nathan [name] is a 80 y.o. male with PMH significant for HTN, CKD 3B, chronic A. fib on Coumadin, left ventricle hypertrophy, thoracic aortic aneurysm, BPH, gout who lives at [facility] skilled [facility] facility. Patient was brought to the ED on 2/11 for progressively worsening confusion, hypotension and hypoxia. Patient was recently hospitalized from 1/23-1/28 after a fall resulting in a fracture of the right humerus for which he underwent ORIF on 1/25 by Dr. Percell Miller. Of note, patient tested positive for Covid when checked prior to discharge to SNF. Per report, at the facility, for last 2 to 3 days, patient had low oral intake, progressively worsening confusion and also shortness of breath. SNF staff noted his blood pressure to be low at 70/40 and hence EMS was called. EMS noted blood pressure low at 68/40, O2 sat was 80% on room air. He was given 500 cc of normal saline, placed on 4 L oxygen by nasal cannula and brought to the ED.  In the ED, Chest x-ray revealed bilateral patchy infiltrates consistent with Covid pneumonia.   Inflammatory markers were elevated. Patient was admitted for COVID pneumonia.  Patient improved on a standard treatment for Covid pneumonia. Family made a choice not to return him back to SNF but rather take home. He was arranged for discharge today 2/17 with home health and DME's. However a repeat x-ray of right shoulder was done and reviewed by orthopedics. X-ray shows migration of orthopedic hardware with the proximal aspect of the intramedullary rod and the screws no longer seated on the humeral head fracture fragment. Orthopedic recommended repeat surgery. Discharge is held for that reason.  Subjective: Patient seen and examined.  He was on nasal as well as  nonrebreather.  He is doing much better than yesterday.  Once again fully alert and oriented.  Denied any shortness of breath.  Assessment/Plan: COVID pneumonia Acute respiratory failure with hypoxia   -Presented with worsening shortness of breath, lethargy -COVID test: PCR positive on 1/27 -Chest imaging: Chest x-ray on admission showed new bilateral airspace lung opacities consistent with multifocal pneumonia -Patient completed 5-day course of IV remdesivir, currently on Solu-Medrol 40 mg IV twice daily. He also received baricitinib 1 mg daily for last 1 week which was eventually discontinued. -Oxygen - SpO2: 98 % O2 Flow Rate (L/min): 15 L/min -Supportive care: Vitamin C, Zinc, PRN inhalers, Tylenol, Antitussives (benzonatate/ Mucinex/Tussionex).   -Encouraged incentive spirometry, prone position, out of bed and early mobilization as much as possible -Continue airborne/contact isolation precautions until 06/08/2020 per ID. Patient's inflammatory markers are improving.  Patient suddenly went into respiratory distress requiring high flow as well as nonrebreather yesterday afternoon on 06/06/2020.  Procalcitonin was once again unremarkable however he continues to have now worsening leukocytosis but remains afebrile.  Chest x-ray shows worsened infiltrates.  Cannot rule out superimposed bacterial infection so he was started on Zosyn.  Also high suspicion for PE but due to elevated creatinine, unable to perform CT angiogram.  Started on IV heparin empirically and ordered VQ scan which is pending.  Patient has improved somewhat compared to yesterday. Recent Labs  Lab 06/01/20 0350 06/02/20 0422 06/03/20 0349 06/05/20 0327 06/06/20 0206 06/06/20 1400 06/06/20 1617 06/06/20 1824 06/07/20 0027 06/07/20 0935  WBC 7.7 8.5 7.3 12.9* 13.9*  --   --   --  21.0*  --   LATICACIDVEN  --   --   --   --   --   --  2.4* 2.0*  --  3.0*  PROCALCITON 0.13  --   --   --   --   --  0.19  --   --   --   DDIMER  1.15* 0.91* 0.85*  --   --  8.13*  --   --   --   --   CRP 4.9* 3.2* 2.3*  --   --  0.9  --   --   --   --   ALT 15 20 20   --   --   --   --   --   --   --    Abnormal urinalysis -Urinalysis with hazy urine, leukocytes and bacteria probably because of dehydration.  AKI on CKD 3B -Creatinine was 1.89 on last discharge, presented with a creatinine elevated 2.29.  Improving with IV fluid, creatinine is down to 1.42 on last check on 2/16..  Encourage oral hydration.  Creatinine back to baseline. Recent Labs    05/15/20 0318 05/29/20 1734 05/30/20 0508 05/31/20 0500 06/01/20 0350 06/02/20 0422 06/03/20 0349 06/05/20 0327 06/06/20 0206 06/07/20 0027  BUN 50* 72* 69* 75* 78* 82* 83* 81* 78* 80*  CREATININE 1.89* 2.29* 2.06* 1.74* 1.53* 1.54* 1.42* 1.45* 1.48* 1.55*   Acute hypernatremia -Due to poor oral intake.   -Sodium level improved with half-normal saline. Encourage oral intake.  Currently not on IV fluid Recent Labs  Lab 06/01/20 0350 06/02/20 0422 06/03/20 0349 06/05/20 0327 06/06/20 0206 06/07/20 0027  NA 144 142 145 143 142 141   Lactic acidosis Non-anion gap metabolic acidosis -Probably secondary to decreased tissue perfusion from dehydration and also anaerobic respiration due to COVID pneumonia.  Now lactic acid worsening.  Unable to provide with IV hydration due to risk of fluid overload.  Acute metabolic encephalopathy Mild chronic cognitive dysfunction -Acute change in mental status because of dehydration, infection. -Patient also has mild chronic cognitive dysfunction.  He is at his baseline.  Continue to monitor  Essential hypertension -Continue to monitor on Cardizem, metoprolol.   -On last hospitalization, lisinopril was stopped and Lasix was reduced.   -Blood pressure stable at this time.  Elevated INR -INR was elevated for unclear reason.  Gradually improving, 6.2 on last check on 2/16. -For several years, patient was on long-term anticoagulation with  Coumadin for A. fib.   -Patient is at significant risk of fall and fatal bleeding. Discussed with family. We will not resume Coumadin at this time.  INR 2.1 today.  We will give him vitamin K 5 mg IV x1 in an attempt to reduce his INR to 1.5 or less for preparation for the surgery tomorrow afternoon. Recent Labs  Lab 06/02/20 0422 06/03/20 0349 06/05/20 0327 06/06/20 0206 06/07/20 0027  INR 7.9* 6.2* 3.7* 1.9* 2.1*   Afib -On metoprolol and Cardizem.  Rates controlled.  Anticoagulation is stopped indefinitely due to high risk of fall however currently he is on heparin drip for suspected PE.  Acute on chronic anemia -Baseline hemoglobin above 10.  No active bleeding.  Hemoglobin remains at baseline. -Continue to monitor.   Recent Labs    05/31/20 0500 05/31/20 0737 06/01/20 0350 06/02/20 0422 06/03/20 0349 06/05/20 0327 06/06/20 0206 06/07/20 0027  HGB 9.6*  --    < > 10.1* 9.1* 9.3* 9.8* 9.9*  MCV 99.4  --    < >  94.3 98.4 93.9 96.1 96.1  VITAMINB12  --  492  --   --   --   --   --   --   FOLATE  --  20.0  --   --   --   --   --   --   FERRITIN  --  348*  --   --   --   --   --   --   TIBC  --  142*  --   --   --   --   --   --   IRON  --  51  --   --   --   --   --   --   RETICCTPCT 1.7  --   --   --   --   --   --   --    < > = values in this interval not displayed.   Recent comminuted fracture of right humerus -After mechanical fall. -1/25, underwent ORIF of right humerus by Dr. Percell Miller. -Repeat x-ray obtained on 2/16 which showed migration of the orthopedic hardware. Repeat surgery recommended by orthopedics.  He was scheduled to have surgery on 06/05/2020 however unfortunately his INR still is 3.5.  Surgery is requesting INR to be 1.5 or less.  Vitamin K would not make any difference today.  I discussed with orthopedic and requested to defer surgery for few hours so we can perhaps provide him with a few units of FFP's in an effort to bring the INR down however I was  informed that they will not be able to do surgery later that day and not even on weekend.  They have rescheduled his surgery for Monday.  INR is coming down.  Will provide vitamin K today. Continue current pain management.  Impaired mobility -Prior to last admission, patient was using a walker at home. -Discharged to SNF on last admission.  Seen by PT.  Limited mobility because of walker dependence but inability to bear weight on the right upper extremity due to fracture  Mild protein calorie malnutrition Hypoalbuminemia -Albumin level low at 1.9.  Secondary to poor appetite, poor p.o. intake -Nutrition consult  Gout -Continuing home regimen of allopurinol  Goal of care conversation: Had a goal of care conversation with patient's son over the phone yesterday when he went into respiratory distress.  Patient was made DNR/DNI.  We also discussed about possibly transitioning to comfort care if patient were to decline from here.  Consulted palliative care.  Code Status:   Code Status: DNR  Nutritional status: Body mass index is 28.32 kg/m. Nutrition Problem: Inadequate oral intake Etiology: poor appetite Signs/Symptoms: meal completion < 25%,per patient/family report Diet Order            Diet Heart Room service appropriate? Yes; Fluid consistency: Thin  Diet effective now                 DVT prophylaxis: SCDs Start: 05/29/20 2011   Antimicrobials:  None Fluid: Not on IV fluid Consultants: None Family Communication:  Updated his son over the phone yesterday.  Status is: Inpatient  Remains inpatient appropriate because: Needs repeat surgical fixation of right humerus fracture  Dispo: The patient is from: SNF              Anticipated d/c is to: Family was willing to take home with DMEs, hospital bed. Plan may change post procedure  Anticipated d/c date is: Anticipate 06/09/2020              Patient currently is not medically stable to d/c.   Difficult to place  patient No  Infusions:  . heparin    . phytonadione (VITAMIN K) IV    . piperacillin-tazobactam (ZOSYN)  IV 3.375 g (06/07/20 0553)    Scheduled Meds: . acetaminophen  1,000 mg Oral Once  . allopurinol  100 mg Oral Daily  . vitamin C  500 mg Oral Daily  . benzoyl peroxide   Topical BID  . Chlorhexidine Gluconate Cloth  6 each Topical Q0600  . diltiazem  120 mg Oral Daily  . feeding supplement  237 mL Oral TID BM  . finasteride  5 mg Oral Daily  . lip balm      . methylPREDNISolone (SOLU-MEDROL) injection  40 mg Intravenous Q12H  . metoprolol tartrate  50 mg Oral BID  . multivitamin with minerals  1 tablet Oral Daily  . sodium chloride flush  10-40 mL Intracatheter Q12H  . zinc sulfate  220 mg Oral Daily    Antimicrobials: Anti-infectives (From admission, onward)   Start     Dose/Rate Route Frequency Ordered Stop   06/06/20 1430  piperacillin-tazobactam (ZOSYN) IVPB 3.375 g        3.375 g 12.5 mL/hr over 240 Minutes Intravenous Every 8 hours 06/06/20 1415     06/05/20 1230  ceFAZolin (ANCEF) IVPB 2g/100 mL premix        2 g 200 mL/hr over 30 Minutes Intravenous On call to O.R. 06/04/20 1341 06/06/20 0559   06/01/20 0900  amoxicillin (AMOXIL) capsule 500 mg  Status:  Discontinued        500 mg Oral Every 8 hours 06/01/20 0858 06/03/20 1544   05/30/20 1000  remdesivir 100 mg in sodium chloride 0.9 % 100 mL IVPB       "Followed by" Linked Group Details   100 mg 200 mL/hr over 30 Minutes Intravenous Daily 05/29/20 2010 06/02/20 1151   05/30/20 0200  cefTRIAXone (ROCEPHIN) 1 g in sodium chloride 0.9 % 100 mL IVPB  Status:  Discontinued        1 g 200 mL/hr over 30 Minutes Intravenous Every 24 hours 05/29/20 2108 06/01/20 0857   05/29/20 2200  remdesivir 200 mg in sodium chloride 0.9% 250 mL IVPB       "Followed by" Linked Group Details   200 mg 580 mL/hr over 30 Minutes Intravenous Once 05/29/20 2010 05/29/20 2317   05/29/20 1845  vancomycin (VANCOREADY) IVPB 1750 mg/350 mL         1,750 mg 175 mL/hr over 120 Minutes Intravenous  Once 05/29/20 1835 05/29/20 2200   05/29/20 1830  vancomycin (VANCOCIN) IVPB 1000 mg/200 mL premix  Status:  Discontinued        1,000 mg 200 mL/hr over 60 Minutes Intravenous  Once 05/29/20 1826 05/29/20 1835   05/29/20 1830  ceFEPIme (MAXIPIME) 2 g in sodium chloride 0.9 % 100 mL IVPB        2 g 200 mL/hr over 30 Minutes Intravenous  Once 05/29/20 1826 05/29/20 1959      PRN meds: acetaminophen, albuterol, diphenhydrAMINE-zinc acetate, guaiFENesin-dextromethorphan, loperamide, melatonin, ondansetron **OR** ondansetron (ZOFRAN) IV, sodium chloride flush   Objective: Vitals:   06/07/20 0058 06/07/20 0300  BP:    Pulse:    Resp:    Temp:    SpO2: 94% 98%    Intake/Output Summary (Last 24 hours)  at 06/07/2020 1111 Last data filed at 06/07/2020 0700 Gross per 24 hour  Intake 28 ml  Output 1100 ml  Net -1072 ml   Filed Weights   05/29/20 1802 05/30/20 0224 06/01/20 0352  Weight: 94.8 kg 83.7 kg 87 kg   Weight change:  Body mass index is 28.32 kg/m.   Physical Exam:  General exam: Appears calm and comfortable but still on nonrebreather Respiratory system: Rhonchi bilaterally. Respiratory effort normal. Cardiovascular system: S1 & S2 heard, RRR. No JVD, murmurs, rubs, gallops or clicks. No pedal edema. Gastrointestinal system: Abdomen is nondistended, soft and nontender. No organomegaly or masses felt. Normal bowel sounds heard. Central nervous system: Alert and oriented x2. No focal neurological deficits. Extremities: Symmetric 5 x 5 power. Skin: No rashes, lesions or ulcers.  Psychiatry: Judgement and insight appear poor Data Review: I have personally reviewed the laboratory data and studies available.  Recent Labs  Lab 06/02/20 0422 06/03/20 0349 06/05/20 0327 06/06/20 0206 06/07/20 0027  WBC 8.5 7.3 12.9* 13.9* 21.0*  NEUTROABS 8.0* 6.8 12.2* 13.2* 19.9*  HGB 10.1* 9.1* 9.3* 9.8* 9.9*  HCT 33.0* 31.0*  29.4* 32.1* 31.8*  MCV 94.3 98.4 93.9 96.1 96.1  PLT 344 279 270 188 158   Recent Labs  Lab 06/01/20 0350 06/02/20 0422 06/03/20 0349 06/05/20 0327 06/06/20 0206 06/07/20 0027  NA 144 142 145 143 142 141  K 3.8 4.5 4.3 4.1 4.5 4.3  CL 117* 113* 118* 118* 117* 115*  CO2 14* 17* 16* 16* 16* 16*  GLUCOSE 116* 112* 103* 117* 114* 126*  BUN 78* 82* 83* 81* 78* 80*  CREATININE 1.53* 1.54* 1.42* 1.45* 1.48* 1.55*  CALCIUM 7.5* 7.8* 7.6* 7.5* 7.7* 7.6*  MG 2.2 2.3 2.4  --   --   --     F/u labs ordered Unresulted Labs (From admission, onward)          Start     Ordered   06/08/20 0500  CBC  Daily,   R     Question:  Specimen collection method  Answer:  Lab=Lab collect   06/06/20 1519   06/07/20 0724  Lactic acid, plasma  STAT Now then every 3 hours,   R (with STAT occurrences)     Question:  Specimen collection method  Answer:  Lab=Lab collect   06/07/20 0932         Signed, Darliss Cheney, MD [facility] 06/07/2020

## 2020-06-07 NOTE — Progress Notes (Signed)
ANTICOAGULATION CONSULT NOTE - Initial Consult  Pharmacy Consult for IV heparin  Indication: concern for PE  Allergies  Allergen Reactions  . Tramadol Nausea And Vomiting    Patient Measurements: Height: 5\' 9"  (175.3 cm) Weight: 87 kg (191 lb 12.8 oz) IBW/kg (Calculated) : 70.7 Heparin Dosing Weight: actual body weight   Vital Signs: Temp: 98 F (36.7 C) (02/19 1344) BP: 134/79 (02/19 1344) Pulse Rate: 57 (02/19 1344)  Labs: Recent Labs    06/05/20 0327 06/06/20 0206 06/07/20 0027  HGB 9.3* 9.8* 9.9*  HCT 29.4* 32.1* 31.8*  PLT 270 188 158  LABPROT 35.5* 21.4* 22.7*  INR 3.7* 1.9* 2.1*  HEPARINUNFRC  --   --  <0.10*  CREATININE 1.45* 1.48* 1.55*    Estimated Creatinine Clearance: 40.1 mL/min (A) (by C-G formula based on SCr of 1.55 mg/dL (H)).   Medical History: Past Medical History:  Diagnosis Date  . Cancer ([place])    skin  . Chronic renal impairment   . Diverticulosis   . DJD (degenerative joint disease)   . Elevated PSA    Dr Junious Silk  . H/O hiatal hernia   . Hypertension   . 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 Permanent atrial fibrillation ([place])   . PONV (postoperative nausea and vomiting)    no  problems recently    Assessment: 37 y/oM on warfarin PTA for atrial fibrillation admitted on 05/29/2020 with COVID pneumonia. On admission, INR significantly elevated at 8.8. Vitamin K 1mg  IV given on 2/11 and 5mg  IV given on 2/17. Anticoagulation was to be stopped indefinitely due to high risk of fall. Plan was for patient to have right reverse shoulder arthroplasty and hardware removal on 2/18, but INR remained elevated at 3.7, so surgery rescheduled for 2/21. Today, patient with worsening SOB. CXR shows worsened multifocal pneumonia. D-dimer 0.85 >> 8.13. VQ scan ordered. Pharmacy consulted to start heparin infusion due to concern for PE. INR 1.9 today.    06/07/20  Heparin level < 0.1 with  heparin gtt @ 1400 units/hr  Hgb 9.9 (stable), PLTC 158 K  INR = 2.1 (no warfarin since admission; see above for INR trends and Vit K administration)  Per RN, heparin gtt has been infusing with no interruptions  No complications of therapy noted  Goal of Therapy:  Heparin level 0.3-0.7 units/ml Monitor platelets by anticoagulation protocol: Yes   Plan:  Increase heparin infusion to 1650 units/hr Heparin level 8 hours after rate increase Daily heparin level, CBC Monitor closely for s/sx of bleeding   Leone Haven, PharmD 06/07/2020,1:43 AM

## 2020-06-07 NOTE — Progress Notes (Signed)
OT Cancellation Note  Patient Details Name: Nathan [name] MRN: [id number] DOB: Aug 30, 1940   Cancelled Treatment:    Reason Eval/Treat Not Completed: Medical issues which prohibited therapy. Pt started on heparin with concern for PE current levels < 0.1. MD note from 2/19 stating possibility of transitioning to hospice/comfort care pending pt improvement in next 24hrs. Will check back 2/21 as schedule permits.  Nathan [name] OT OT pager: [phone]       Nathan [name] 06/07/2020, 6:28 AM

## 2020-06-07 NOTE — Progress Notes (Signed)
FYI--writer has just heard from patient's son, Nathan [name], Nathan [name]. He states that the family does NOT want patient to have the surgery on his right shoulder tomorrow, nor anytime, until patient is "feeling and looking way better".

## 2020-06-07 NOTE — Progress Notes (Signed)
ANTICOAGULATION CONSULT NOTE - Follow Up Consult  Pharmacy Consult for IV heparin  Indication: concern for PE  Allergies  Allergen Reactions  . Tramadol Nausea And Vomiting    Patient Measurements: Height: 5\' 9"  (175.3 cm) Weight: 87 kg (191 lb 12.8 oz) IBW/kg (Calculated) : 70.7 Heparin Dosing Weight: actual body weight   Vital Signs:    Labs: Recent Labs    06/05/20 0327 06/06/20 0206 06/07/20 0027 06/07/20 0935  HGB 9.3* 9.8* 9.9*  --   HCT 29.4* 32.1* 31.8*  --   PLT 270 188 158  --   LABPROT 35.5* 21.4* 22.7*  --   INR 3.7* 1.9* 2.1*  --   HEPARINUNFRC  --   --  <0.10* <0.10*  CREATININE 1.45* 1.48* 1.55*  --     Estimated Creatinine Clearance: 40.1 mL/min (A) (by C-G formula based on SCr of 1.55 mg/dL (H)).   Assessment: 60 y/oM on warfarin PTA for atrial fibrillation admitted on 05/29/2020 with COVID pneumonia. On admission, INR significantly elevated at 8.8. Vitamin K 1mg  IV given on 2/11 and 5mg  IV given on 2/17. Anticoagulation was to be stopped indefinitely due to high risk of fall. Plan was for patient to have right reverse shoulder arthroplasty and hardware removal on 2/18, but INR remained elevated at 3.7, so surgery rescheduled for 2/21. Today, patient with worsening SOB. CXR shows worsened multifocal pneumonia. D-dimer 0.85 >> 8.13. VQ scan ordered. Pharmacy consulted to start heparin infusion due to concern for PE. INR 1.9 today.    06/07/20  Heparin level < 0.1 again despite increased rate to 1650 units/hr  Hgb 9.9 (stable), PLTC 158 K  INR = 2.1 (no warfarin since admission; see above for INR trends and Vit K administration)  Per RN, heparin gtt has been infusing with no interruptions  No complications of therapy noted  Goal of Therapy:  Heparin level 0.3-0.7 units/ml Monitor platelets by anticoagulation protocol: Yes   Plan:  Per discussion with MD, will give Vitamin K 5mg  IV x 1 in anticipation of surgery tomorrow.  Will also stop IV  heparin at 09:00 tomorrow.  Increase heparin infusion to 1850 units/hr Heparin level 8 hours after rate increase Daily heparin level, CBC Monitor closely for s/sx of bleeding  Peggyann Juba, PharmD, BCPS Pharmacy: [phone] 06/07/2020,11:07 AM

## 2020-06-07 NOTE — Progress Notes (Signed)
MD has just informed me that he did not get my "secure chat" regarding this pt weeping from bilateral arms.  I have "chatted" with him at this time and am aware of new orders that MD is writing.

## 2020-06-07 NOTE — Progress Notes (Signed)
Have alerted MD via secure chat that pt is "weeping" from bilateral arms; enough to wet the bed multiple times today.  Multiple family members visiting today. Pt continues on 15 L/min NRB and HFNC. Slightly confused.  Dyspnea at rest. "Significant other" at bedside.  Decreased appetite. Continues slow A.Fib. Monitoring continues.

## 2020-06-08 ENCOUNTER — Inpatient Hospital Stay (HOSPITAL_COMMUNITY): Payer: Medicare Other

## 2020-06-08 ENCOUNTER — Encounter (HOSPITAL_COMMUNITY)
Admission: EM | Disposition: A | Discharge status: Hospice | Payer: Self-pay | Source: Skilled Nursing Facility | Attending: Internal Medicine

## 2020-06-08 DIAGNOSIS — J9601 Acute respiratory failure with hypoxia: Secondary | ICD-10-CM | POA: Diagnosis not present

## 2020-06-08 DIAGNOSIS — U071 COVID-19: Secondary | ICD-10-CM | POA: Diagnosis not present

## 2020-06-08 LAB — CBC
HCT: 34.6 % — ABNORMAL LOW (ref 39.0–52.0)
Hemoglobin: 11.3 g/dL — ABNORMAL LOW (ref 13.0–17.0)
MCH: 30.2 pg (ref 26.0–34.0)
MCHC: 32.7 g/dL (ref 30.0–36.0)
MCV: 92.5 fL (ref 80.0–100.0)
Platelets: 135 10*3/uL — ABNORMAL LOW (ref 150–400)
RBC: 3.74 MIL/uL — ABNORMAL LOW (ref 4.22–5.81)
RDW: 17.6 % — ABNORMAL HIGH (ref 11.5–15.5)
WBC: 16.3 10*3/uL — ABNORMAL HIGH (ref 4.0–10.5)
nRBC: 0.1 % (ref 0.0–0.2)

## 2020-06-08 LAB — HEPARIN LEVEL (UNFRACTIONATED)
Heparin Unfractionated: 0.96 IU/mL — ABNORMAL HIGH (ref 0.30–0.70)
Heparin Unfractionated: 1.42 IU/mL — ABNORMAL HIGH (ref 0.30–0.70)

## 2020-06-08 LAB — BASIC METABOLIC PANEL
Anion gap: 9 (ref 5–15)
BUN: 88 mg/dL — ABNORMAL HIGH (ref 8–23)
CO2: 18 mmol/L — ABNORMAL LOW (ref 22–32)
Calcium: 7.8 mg/dL — ABNORMAL LOW (ref 8.9–10.3)
Chloride: 116 mmol/L — ABNORMAL HIGH (ref 98–111)
Creatinine, Ser: 1.67 mg/dL — ABNORMAL HIGH (ref 0.61–1.24)
GFR, Estimated: 41 mL/min — ABNORMAL LOW (ref >60)
Glucose, Bld: 92 mg/dL (ref 70–99)
Potassium: 5.6 mmol/L — ABNORMAL HIGH (ref 3.5–5.1)
Sodium: 143 mmol/L (ref 135–145)

## 2020-06-08 SURGERY — ARTHROPLASTY, SHOULDER, TOTAL, REVERSE
Anesthesia: General | Site: Shoulder | Laterality: Right

## 2020-06-08 MED ORDER — SODIUM CHLORIDE 0.9 % IV SOLN
3.0000 g | Freq: Four times a day (QID) | INTRAVENOUS | Status: DC
  Administered 2020-06-08 – 2020-06-10 (×8): 3 g via INTRAVENOUS
  Filled 2020-06-08: qty 8
  Filled 2020-06-08 (×3): qty 3
  Filled 2020-06-08: qty 8
  Filled 2020-06-08: qty 3
  Filled 2020-06-08 (×2): qty 8
  Filled 2020-06-08: qty 3
  Filled 2020-06-08: qty 8

## 2020-06-08 MED ORDER — HEPARIN (PORCINE) 25000 UT/250ML-% IV SOLN
1150.0000 [IU]/h | INTRAVENOUS | Status: DC
  Administered 2020-06-08: 1450 [IU]/h via INTRAVENOUS
  Administered 2020-06-09: 1150 [IU]/h via INTRAVENOUS
  Filled 2020-06-08 (×4): qty 250

## 2020-06-08 MED ORDER — FUROSEMIDE 10 MG/ML IJ SOLN
40.0000 mg | Freq: Once | INTRAMUSCULAR | Status: AC
  Administered 2020-06-08: 40 mg via INTRAVENOUS
  Filled 2020-06-08: qty 4

## 2020-06-08 MED ORDER — TECHNETIUM TO 99M ALBUMIN AGGREGATED
3.7000 | Freq: Once | INTRAVENOUS | Status: AC | PRN
  Administered 2020-06-08: 3.7 via INTRAVENOUS

## 2020-06-08 NOTE — Progress Notes (Signed)
Per chart review, patient went into respiratory distress over the weekend. He required 15 L O2. He was placed on high flow and nonrebreather. Family does not want to consider surgery until the patiet's status has improved. We believe this is reasonable as he has not complained of significant pain in the shoulder. Hospice and Palliative Care services have consulted on the patient. We will hold off any future plans of surgery at this time and we will be available as needed for any questions/concerns. He may use a sling for comfort.  Noemi Chapel, PA-C 06/08/20

## 2020-06-08 NOTE — Progress Notes (Signed)
ANTICOAGULATION CONSULT NOTE - Follow Up Consult  Pharmacy Consult for IV heparin  Indication: concern for PE, hx Afib  Allergies  Allergen Reactions  . Tramadol Nausea And Vomiting    Patient Measurements: Height: 5\' 9"  (175.3 cm) Weight: 87 kg (191 lb 12.8 oz) IBW/kg (Calculated) : 70.7 Heparin Dosing Weight: actual body weight   Vital Signs: Temp: 97.7 F (36.5 C) (02/21 2002) BP: 124/74 (02/21 2002) Pulse Rate: 48 (02/21 2002)  Labs: Recent Labs    06/06/20 0206 06/07/20 0027 06/07/20 0935 06/07/20 1910 06/08/20 0752 06/08/20 0755 06/08/20 1743  HGB 9.8* 9.9*  --   --  11.3*  --   --   HCT 32.1* 31.8*  --   --  34.6*  --   --   PLT 188 158  --   --  135*  --   --   LABPROT 21.4* 22.7*  --   --   --   --   --   INR 1.9* 2.1*  --   --   --   --   --   HEPARINUNFRC  --  <0.10*   < > <0.10*  --  0.96* 1.42*  CREATININE 1.48* 1.55*  --   --  1.67*  --   --    < > = values in this interval not displayed.    Estimated Creatinine Clearance: 37.2 mL/min (A) (by C-G formula based on SCr of 1.67 mg/dL (H)).   Assessment: 52 y/oM on warfarin PTA for atrial fibrillation admitted on 05/29/2020 with COVID pneumonia. On admission, INR significantly elevated at 8.8. Vitamin K 1mg  IV given on 2/11 and 5mg  IV given on 2/17. Anticoagulation was to be stopped indefinitely due to high risk of fall. Plan was for patient to have right reverse shoulder arthroplasty and hardware removal on 2/18, but INR remained elevated at 3.7, so surgery rescheduled for 2/21. Today, patient with worsening SOB. CXR shows worsened multifocal pneumonia. D-dimer 0.85 >> 8.13. VQ scan ordered. Pharmacy consulted to start heparin infusion due to concern for PE. INR 1.9 today.    06/08/20  Heparin level now SUPRAtherapeutic on current IV heparin rate of 2050 units/hr   Note per RN, that there was concern that the IV site that the heparin was running thru was not good and heparin was not fully getting into  veins so the site was changed this AM and that could be contributing to the elevated level now since patient may now have a more viable IV site. As a result, will assume that the new level is accurate  Hgb 11.3 improved, PLTC 135 down some - monitor  INR = 2.1 on 2/20 no INR this AM 2/21 (no warfarin since admission; see above for INR trends and Vit K administration) ==> received Vitamin K 5mg  IV x 1 @ 05:69  No complications of therapy noted  Also, per Md, will not stop IV heparin for potential surgery now this AM as patient/family unsure if will do surgery any longer  Goal of Therapy:  Heparin level 0.3-0.7 units/ml Monitor platelets by anticoagulation protocol: Yes   Plan:   Decrease heparin infusion from 2050 units/hr to 1450 units/hr  Heparin level 8 hours after rate decrease  Daily heparin level, CBC  Monitor closely for s/sx of bleeding ==========================================  06/08/2020 PM Note:  Heparin level remains supratherapeutic and still rising despite heparin rate decrease to 1450 units/hr earlier today.   RN confirms infusion rate, states no bleeding noted  Plan:  Reduce heparin to 1150 units/hr  Recheck heparin level in AM  Clayburn Pert, PharmD, BCPS 06/08/2020  9:08 PM    .

## 2020-06-08 NOTE — Progress Notes (Signed)
ANTICOAGULATION CONSULT NOTE - Follow Up Consult  Pharmacy Consult for IV heparin  Indication: concern for PE, hx Afib  Allergies  Allergen Reactions  . Tramadol Nausea And Vomiting    Patient Measurements: Height: 5\' 9"  (175.3 cm) Weight: 87 kg (191 lb 12.8 oz) IBW/kg (Calculated) : 70.7 Heparin Dosing Weight: actual body weight   Vital Signs: Temp: 98 F (36.7 C) (02/21 0444) BP: 126/80 (02/21 0444) Pulse Rate: 54 (02/21 0444)  Labs: Recent Labs    06/06/20 0206 06/06/20 0206 06/07/20 0027 06/07/20 0935 06/07/20 1910 06/08/20 0752 06/08/20 0755  HGB 9.8*  --  9.9*  --   --  11.3*  --   HCT 32.1*  --  31.8*  --   --  34.6*  --   PLT 188  --  158  --   --  135*  --   LABPROT 21.4*  --  22.7*  --   --   --   --   INR 1.9*  --  2.1*  --   --   --   --   HEPARINUNFRC  --    < > <0.10* <0.10* <0.10*  --  0.96*  CREATININE 1.48*  --  1.55*  --   --  1.67*  --    < > = values in this interval not displayed.    Estimated Creatinine Clearance: 37.2 mL/min (A) (by C-G formula based on SCr of 1.67 mg/dL (H)).   Assessment: 20 y/oM on warfarin PTA for atrial fibrillation admitted on 05/29/2020 with COVID pneumonia. On admission, INR significantly elevated at 8.8. Vitamin K 1mg  IV given on 2/11 and 5mg  IV given on 2/17. Anticoagulation was to be stopped indefinitely due to high risk of fall. Plan was for patient to have right reverse shoulder arthroplasty and hardware removal on 2/18, but INR remained elevated at 3.7, so surgery rescheduled for 2/21. Today, patient with worsening SOB. CXR shows worsened multifocal pneumonia. D-dimer 0.85 >> 8.13. VQ scan ordered. Pharmacy consulted to start heparin infusion due to concern for PE. INR 1.9 today.    06/08/20  Heparin level now SUPRAtherapeutic on current IV heparin rate of 2050 units/hr   Note per RN, that there was concern that the IV site that the heparin was running thru was not good and heparin was not fully getting into  veins so the site was changed this AM and that could be contributing to the elevated level now since patient may now have a more viable IV site. As a result, will assume that the new level is accurate  Hgb 11.3 improved, PLTC 135 down some - monitor  INR = 2.1 on 2/20 no INR this AM 2/21 (no warfarin since admission; see above for INR trends and Vit K administration) ==> received Vitamin K 5mg  IV x 1 @ 77:82  No complications of therapy noted  Also, per Md, will not stop IV heparin for potential surgery now this AM as patient/family unsure if will do surgery any longer  Goal of Therapy:  Heparin level 0.3-0.7 units/ml Monitor platelets by anticoagulation protocol: Yes   Plan:   Decrease heparin infusion from 2050 units/hr to 1450 units/hr  Heparin level 8 hours after rate decrease  Daily heparin level, CBC  Monitor closely for s/sx of bleeding    Adrian Saran, PharmD, BCPS [phone] until 3pm 06/08/2020 9:35 AM

## 2020-06-08 NOTE — Care Management Important Message (Signed)
Important Message  Patient Details IM Letter placed in Patient's door caddy. Name: AKSH SWART MRN: [id number] Date of Birth: 04/22/1940   Medicare Important Message Given:  Yes     Kerin Salen 06/08/2020, 11:36 AM

## 2020-06-08 NOTE — Progress Notes (Signed)
PROGRESS NOTE  Marlee Trentman [id number]  DOB: 12-Sep-1940  PCP: Janith Lima, MD RCV:[id number]  DOA: 05/29/2020  LOS: 10 days   Chief Complaint  Patient presents with  . Altered Mental Status   Brief narrative: DERRECK WILTSEY is a 80 y.o. male with PMH significant for HTN, CKD 3B, chronic A. fib on Coumadin, left ventricle hypertrophy, thoracic aortic aneurysm, BPH, gout who lives at [facility]. Patient was brought to the ED on 2/11 for progressively worsening confusion, hypotension and hypoxia. Patient was recently hospitalized from 1/23-1/28 after a fall resulting in a fracture of the right humerus for which he underwent ORIF on 1/25 by Dr. Percell Miller. Of note, patient tested positive for Covid when checked prior to discharge to SNF. Per report, at the facility, for last 2 to 3 days, patient had low oral intake, progressively worsening confusion and also shortness of breath. SNF staff noted his blood pressure to be low at 70/40 and hence EMS was called. EMS noted blood pressure low at 68/40, O2 sat was 80% on room air. He was given 500 cc of normal saline, placed on 4 L oxygen by nasal cannula and brought to the ED.  In the ED, Chest x-ray revealed bilateral patchy infiltrates consistent with Covid pneumonia.   Inflammatory markers were elevated. Patient was admitted for COVID pneumonia.  Patient improved on a standard treatment for Covid pneumonia. Family made a choice not to return him back to SNF but rather take home. He was arranged for discharge today 2/17 with home health and DME's. However a repeat x-ray of right shoulder was done and reviewed by orthopedics. X-ray shows migration of orthopedic hardware with the proximal aspect of the intramedullary rod and the screws no longer seated on the humeral head fracture fragment. Orthopedic recommended repeat surgery. Discharge is held for that reason.  Subjective: Patient seen and examined.  Still requiring 10 L or  so oxygen of high flow but not on nonrebreather anymore.  More alert and oriented x2 today.  This is his baseline.  Denied any shortness of breath.  I did not notice any weeping in the extremities.  Assessment/Plan: COVID pneumonia Acute respiratory failure with hypoxia   -Presented with worsening shortness of breath, lethargy -COVID test: PCR positive on 1/27 -Chest imaging: Chest x-ray on admission showed new bilateral airspace lung opacities consistent with multifocal pneumonia -Patient completed 5-day course of IV remdesivir, currently on Solu-Medrol 40 mg IV twice daily. He also received baricitinib 1 mg daily for last 1 week which was eventually discontinued. -Oxygen - SpO2: 90 % O2 Flow Rate (L/min): 15 L/min -Supportive care: Vitamin C, Zinc, PRN inhalers, Tylenol, Antitussives (benzonatate/ Mucinex/Tussionex).   -Encouraged incentive spirometry, prone position, out of bed and early mobilization as much as possible -Continue airborne/contact isolation precautions until 06/08/2020 per ID. Patient's inflammatory markers are improving.  Patient suddenly went into respiratory distress requiring high flow as well as nonrebreather yesterday afternoon on 06/06/2020.  Procalcitonin was once again unremarkable. Chest x-ray shows worsened infiltrates.  Cannot rule out superimposed bacterial infection so he was started on Zosyn.  Also high suspicion for PE but due to elevated creatinine, unable to perform CT angiogram.  Started on IV heparin empirically and ordered VQ scan which is pending.  Patient has improved somewhat compared to yesterday.  Leukocytosis improving as well.  We will give him another dose of IV Lasix today. Recent Labs  Lab 06/02/20 0422 06/03/20 0349 06/05/20 0327 06/06/20 0206 06/06/20  1400 06/06/20 1617 06/06/20 1617 06/06/20 1824 06/07/20 0027 06/07/20 0935 06/07/20 1126 06/07/20 1430 06/07/20 1910 06/08/20 0752  WBC 8.5 7.3 12.9* 13.9*  --   --   --   --  21.0*  --    --   --   --  16.3*  LATICACIDVEN  --   --   --   --   --  2.4*   < > 2.0*  --  3.0* 3.3* 2.4* 2.5*  --   PROCALCITON  --   --   --   --   --  0.19  --   --   --   --   --   --   --   --   DDIMER 0.91* 0.85*  --   --  8.13*  --   --   --   --   --   --   --   --   --   CRP 3.2* 2.3*  --   --  0.9  --   --   --   --   --   --   --   --   --   ALT 20 20  --   --   --   --   --   --   --   --   --   --   --   --    < > = values in this interval not displayed.   Abnormal urinalysis -Urinalysis with hazy urine, leukocytes and bacteria probably because of dehydration.  AKI on CKD 3B -Creatinine was 1.89 on last discharge, presented with a creatinine elevated 2.29.  Improving with IV fluid, creatinine is down to 1.42 on last check on 2/16..  Encourage oral hydration.  Creatinine back to baseline. Recent Labs    05/29/20 1734 05/30/20 0508 05/31/20 0500 06/01/20 0350 06/02/20 0422 06/03/20 0349 06/05/20 0327 06/06/20 0206 06/07/20 0027 06/08/20 0752  BUN 72* 69* 75* 78* 82* 83* 81* 78* 80* 88*  CREATININE 2.29* 2.06* 1.74* 1.53* 1.54* 1.42* 1.45* 1.48* 1.55* 1.67*   Acute hypernatremia -Due to poor oral intake.   -Sodium level improved with half-normal saline. Encourage oral intake.  Currently not on IV fluid Recent Labs  Lab 06/02/20 0422 06/03/20 0349 06/05/20 0327 06/06/20 0206 06/07/20 0027 06/08/20 0752  NA 142 145 143 142 141 143   Lactic acidosis Non-anion gap metabolic acidosis -Probably secondary to decreased tissue perfusion from dehydration and also anaerobic respiration due to COVID pneumonia.  Now lactic acid worsening.  Unable to provide with IV hydration due to risk of fluid overload.  Acute metabolic encephalopathy Mild chronic cognitive dysfunction -Acute change in mental status because of dehydration, infection. -Patient also has mild chronic cognitive dysfunction.  He is at his baseline.  Continue to monitor  Essential hypertension -Continue to monitor on  Cardizem, metoprolol.   -On last hospitalization, lisinopril was stopped and Lasix was reduced.   -Blood pressure stable at this time.  Elevated INR -INR was elevated for unclear reason.  Gradually improving, 6.2 on last check on 2/16. -For several years, patient was on long-term anticoagulation with Coumadin for A. fib.   -Patient is at significant risk of fall and fatal bleeding. Discussed with family. We will not resume Coumadin at this time.  INR 2.1 today.  We will give him vitamin K 5 mg IV x1 in an attempt to reduce his INR to 1.5 or less for preparation for the surgery  tomorrow afternoon. Recent Labs  Lab 06/02/20 0422 06/03/20 0349 06/05/20 0327 06/06/20 0206 06/07/20 0027  INR 7.9* 6.2* 3.7* 1.9* 2.1*   Afib -On metoprolol and Cardizem.  Rates controlled.  Anticoagulation is stopped indefinitely due to high risk of fall however currently he is on heparin drip for suspected PE.  Acute on chronic anemia -Baseline hemoglobin above 10.  No active bleeding.  Hemoglobin remains at baseline. -Continue to monitor.   Recent Labs    05/31/20 0500 05/31/20 0737 06/01/20 0350 06/03/20 0349 06/05/20 0327 06/06/20 0206 06/07/20 0027 06/08/20 0752  HGB 9.6*  --    < > 9.1* 9.3* 9.8* 9.9* 11.3*  MCV 99.4  --    < > 98.4 93.9 96.1 96.1 92.5  VITAMINB12  --  492  --   --   --   --   --   --   FOLATE  --  20.0  --   --   --   --   --   --   FERRITIN  --  348*  --   --   --   --   --   --   TIBC  --  142*  --   --   --   --   --   --   IRON  --  51  --   --   --   --   --   --   RETICCTPCT 1.7  --   --   --   --   --   --   --    < > = values in this interval not displayed.   Recent comminuted fracture of right humerus -After mechanical fall. -1/25, underwent ORIF of right humerus by Dr. Percell Miller. -Repeat x-ray obtained on 2/16 which showed migration of the orthopedic hardware. Repeat surgery recommended by orthopedics.  He was scheduled to have surgery on 06/05/2020 however  unfortunately his INR still is 3.5.  Surgery is requesting INR to be 1.5 or less.  Vitamin K would not make any difference today.  I discussed with orthopedic and requested to defer surgery for few hours so we can perhaps provide him with a few units of FFP's in an effort to bring the INR down however I was informed that they will not be able to do surgery later that day and not even on weekend.  They had rescheduled his surgery for Monday however patient son wants to hold after surgery for now until patient improves slightly more.  Impaired mobility -Prior to last admission, patient was using a walker at home. -Discharged to SNF on last admission.  Seen by PT.  Limited mobility because of walker dependence but inability to bear weight on the right upper extremity due to fracture  Mild protein calorie malnutrition Hypoalbuminemia -Albumin level low at 1.9.  Secondary to poor appetite, poor p.o. intake -Nutrition consult  Gout -Continuing home regimen of allopurinol  Goal of care conversation: Had a goal of care conversation with patient's son over the phone  when he went into respiratory distress.  Patient was made DNR/DNI.  We also discussed about possibly transitioning to comfort care if patient were to decline from here.  Palliative care on board.  Hyperkalemia: Only 5.6.  Hopefully this will be corrected with Lasix.  Repeat in the morning.  Code Status:   Code Status: DNR  Nutritional status: Body mass index is 28.32 kg/m. Nutrition Problem: Inadequate oral intake Etiology: poor appetite Signs/Symptoms: meal  completion < 25%,per patient/family report Diet Order            Diet Heart Room service appropriate? Yes; Fluid consistency: Thin  Diet effective now                 DVT prophylaxis: SCDs Start: 05/29/20 2011   Antimicrobials:  None Fluid: Not on IV fluid Consultants: None Family Communication:  Updated his son over the phone today.  Status is:  Inpatient  Remains inpatient appropriate because: Needs repeat surgical fixation of right humerus fracture  Dispo: The patient is from: SNF              Anticipated d/c is to: Family was willing to take home with DMEs, hospital bed. Plan may change post procedure              Anticipated d/c date is: Anticipate 06/11/2020              Patient currently is not medically stable to d/c.   Difficult to place patient No  Infusions:  . heparin 1,450 Units/hr (06/08/20 1113)  . piperacillin-tazobactam (ZOSYN)  IV 3.375 g (06/08/20 0531)    Scheduled Meds: . acetaminophen  1,000 mg Oral Once  . allopurinol  100 mg Oral Daily  . vitamin C  500 mg Oral Daily  . benzoyl peroxide   Topical BID  . Chlorhexidine Gluconate Cloth  6 each Topical Q0600  . diltiazem  120 mg Oral Daily  . feeding supplement  237 mL Oral TID BM  . finasteride  5 mg Oral Daily  . furosemide  40 mg Intravenous Once  . methylPREDNISolone (SOLU-MEDROL) injection  40 mg Intravenous Q12H  . metoprolol tartrate  50 mg Oral BID  . multivitamin with minerals  1 tablet Oral Daily  . sodium chloride flush  10-40 mL Intracatheter Q12H  . zinc sulfate  220 mg Oral Daily    Antimicrobials: Anti-infectives (From admission, onward)   Start     Dose/Rate Route Frequency Ordered Stop   06/06/20 1430  piperacillin-tazobactam (ZOSYN) IVPB 3.375 g        3.375 g 12.5 mL/hr over 240 Minutes Intravenous Every 8 hours 06/06/20 1415     06/05/20 1230  ceFAZolin (ANCEF) IVPB 2g/100 mL premix        2 g 200 mL/hr over 30 Minutes Intravenous On call to O.R. 06/04/20 1341 06/06/20 0559   06/01/20 0900  amoxicillin (AMOXIL) capsule 500 mg  Status:  Discontinued        500 mg Oral Every 8 hours 06/01/20 0858 06/03/20 1544   05/30/20 1000  remdesivir 100 mg in sodium chloride 0.9 % 100 mL IVPB       "Followed by" Linked Group Details   100 mg 200 mL/hr over 30 Minutes Intravenous Daily 05/29/20 2010 06/02/20 1151   05/30/20 0200   cefTRIAXone (ROCEPHIN) 1 g in sodium chloride 0.9 % 100 mL IVPB  Status:  Discontinued        1 g 200 mL/hr over 30 Minutes Intravenous Every 24 hours 05/29/20 2108 06/01/20 0857   05/29/20 2200  remdesivir 200 mg in sodium chloride 0.9% 250 mL IVPB       "Followed by" Linked Group Details   200 mg 580 mL/hr over 30 Minutes Intravenous Once 05/29/20 2010 05/29/20 2317   05/29/20 1845  vancomycin (VANCOREADY) IVPB 1750 mg/350 mL        1,750 mg 175 mL/hr over 120 Minutes Intravenous  Once 05/29/20  1835 05/29/20 2200   05/29/20 1830  vancomycin (VANCOCIN) IVPB 1000 mg/200 mL premix  Status:  Discontinued        1,000 mg 200 mL/hr over 60 Minutes Intravenous  Once 05/29/20 1826 05/29/20 1835   05/29/20 1830  ceFEPIme (MAXIPIME) 2 g in sodium chloride 0.9 % 100 mL IVPB        2 g 200 mL/hr over 30 Minutes Intravenous  Once 05/29/20 1826 05/29/20 1959      PRN meds: acetaminophen, albuterol, diphenhydrAMINE-zinc acetate, guaiFENesin-dextromethorphan, loperamide, melatonin, ondansetron **OR** ondansetron (ZOFRAN) IV, sodium chloride flush   Objective: Vitals:   06/07/20 2100 06/08/20 0444  BP:  126/80  Pulse: 68 (!) 54  Resp:  20  Temp:  98 F (36.7 C)  SpO2:  90%    Intake/Output Summary (Last 24 hours) at 06/08/2020 1125 Last data filed at 06/08/2020 1100 Gross per 24 hour  Intake 607.6 ml  Output 1700 ml  Net -1092.4 ml   Filed Weights   05/29/20 1802 05/30/20 0224 06/01/20 0352  Weight: 94.8 kg 83.7 kg 87 kg   Weight change:  Body mass index is 28.32 kg/m.   Physical Exam:  General exam: Appears calm and comfortable  Respiratory system: Rhonchi bilaterally. Respiratory effort normal. Cardiovascular system: S1 & S2 heard, RRR. No JVD, murmurs, rubs, gallops or clicks. No pedal edema. Gastrointestinal system: Abdomen is nondistended, soft and nontender. No organomegaly or masses felt. Normal bowel sounds heard. Central nervous system: Alert and oriented x2. No focal  neurological deficits. Extremities: Symmetric 5 x 5 power. Skin: No rashes, lesions or ulcers.  Psychiatry: Judgement and insight appear poor   Data Review: I have personally reviewed the laboratory data and studies available.  Recent Labs  Lab 06/02/20 0422 06/03/20 0349 06/05/20 0327 06/06/20 0206 06/07/20 0027 06/08/20 0752  WBC 8.5 7.3 12.9* 13.9* 21.0* 16.3*  NEUTROABS 8.0* 6.8 12.2* 13.2* 19.9*  --   HGB 10.1* 9.1* 9.3* 9.8* 9.9* 11.3*  HCT 33.0* 31.0* 29.4* 32.1* 31.8* 34.6*  MCV 94.3 98.4 93.9 96.1 96.1 92.5  PLT 344 279 270 188 158 135*   Recent Labs  Lab 06/02/20 0422 06/03/20 0349 06/05/20 0327 06/06/20 0206 06/07/20 0027 06/08/20 0752  NA 142 145 143 142 141 143  K 4.5 4.3 4.1 4.5 4.3 5.6*  CL 113* 118* 118* 117* 115* 116*  CO2 17* 16* 16* 16* 16* 18*  GLUCOSE 112* 103* 117* 114* 126* 92  BUN 82* 83* 81* 78* 80* 88*  CREATININE 1.54* 1.42* 1.45* 1.48* 1.55* 1.67*  CALCIUM 7.8* 7.6* 7.5* 7.7* 7.6* 7.8*  MG 2.3 2.4  --   --   --   --     F/u labs ordered Unresulted Labs (From admission, onward)          Start     Ordered   06/08/20 1800  Heparin level (unfractionated)  Once-Timed,   TIMED       Question:  Specimen collection method  Answer:  Lab=Lab collect   06/08/20 0944   06/08/20 0500  CBC  Daily,   R     Question:  Specimen collection method  Answer:  Lab=Lab collect   06/06/20 1519         Signed, Darliss Cheney, MD [facility] 06/08/2020

## 2020-06-09 LAB — CBC
HCT: 31.5 % — ABNORMAL LOW (ref 39.0–52.0)
Hemoglobin: 9.9 g/dL — ABNORMAL LOW (ref 13.0–17.0)
MCH: 29.9 pg (ref 26.0–34.0)
MCHC: 31.4 g/dL (ref 30.0–36.0)
MCV: 95.2 fL (ref 80.0–100.0)
Platelets: 122 10*3/uL — ABNORMAL LOW (ref 150–400)
RBC: 3.31 MIL/uL — ABNORMAL LOW (ref 4.22–5.81)
RDW: 18 % — ABNORMAL HIGH (ref 11.5–15.5)
WBC: 18.4 10*3/uL — ABNORMAL HIGH (ref 4.0–10.5)
nRBC: 0.1 % (ref 0.0–0.2)

## 2020-06-09 LAB — BASIC METABOLIC PANEL
Anion gap: 14 (ref 5–15)
BUN: 112 mg/dL — ABNORMAL HIGH (ref 8–23)
CO2: 17 mmol/L — ABNORMAL LOW (ref 22–32)
Calcium: 7.6 mg/dL — ABNORMAL LOW (ref 8.9–10.3)
Chloride: 119 mmol/L — ABNORMAL HIGH (ref 98–111)
Creatinine, Ser: 1.83 mg/dL — ABNORMAL HIGH (ref 0.61–1.24)
GFR, Estimated: 36 mL/min — ABNORMAL LOW (ref >60)
Glucose, Bld: 155 mg/dL — ABNORMAL HIGH (ref 70–99)
Potassium: 4 mmol/L (ref 3.5–5.1)
Sodium: 150 mmol/L — ABNORMAL HIGH (ref 135–145)

## 2020-06-09 LAB — HEPARIN LEVEL (UNFRACTIONATED): Heparin Unfractionated: 1.5 IU/mL — ABNORMAL HIGH (ref 0.30–0.70)

## 2020-06-09 MED ORDER — HEPARIN SODIUM (PORCINE) 5000 UNIT/ML IJ SOLN
5000.0000 [IU] | Freq: Three times a day (TID) | INTRAMUSCULAR | Status: DC
  Administered 2020-06-09 – 2020-06-10 (×3): 5000 [IU] via SUBCUTANEOUS
  Filled 2020-06-09 (×3): qty 1

## 2020-06-09 MED ORDER — FUROSEMIDE 10 MG/ML IJ SOLN
40.0000 mg | Freq: Once | INTRAMUSCULAR | Status: AC
  Administered 2020-06-09: 40 mg via INTRAVENOUS
  Filled 2020-06-09: qty 4

## 2020-06-09 MED ORDER — HEPARIN (PORCINE) 25000 UT/250ML-% IV SOLN
700.0000 [IU]/h | INTRAVENOUS | Status: DC
  Filled 2020-06-09: qty 250

## 2020-06-09 NOTE — Progress Notes (Signed)
   Daily Progress Note   Patient Name: Nathan [name]       Date: 06/09/2020 DOB: December 23, 1940  Age: 80 y.o. MRN#: [id number] Attending Physician: Nathan Cheney, MD Primary Care Physician: Nathan Lima, MD Admit Date: 05/29/2020  Reason for Consultation/Follow-up: Establishing goals of care  Subjective: Chart Reviewed. Updates Received.   No acute distress.   Spoke with son, Nathan [name]. Updates provided by myself and attending. We reviewed previous goals of care discussions. Nathan [name] shares family is appreciative patient is "maintaining".   Family would like to continue taking care and decisions day by day given patient has not gotten worst or showing signs of further decline. They are clear in expressing wishes for patient not to suffer or life-prolonging measures.   Education has been provided on comfort care vs aggressive interventions. Family would like no escalation but to continue current plan treating the treatable. They are open to focusing on comfort if no improvement or further decline.   All questions answered and support provided.    Length of Stay: 11 days  Vital Signs: BP 121/70 (BP Location: Right Leg)   Pulse (!) 52   Temp 97.6 F (36.4 C) (Axillary)   Resp 16   Ht 5\' 9"  (1.753 m)   Wt 87 kg   SpO2 95%   BMI 28.32 kg/m  SpO2: SpO2: 95 % O2 Device: O2 Device: High Flow Nasal Cannula O2 Flow Rate: O2 Flow Rate (L/min): 6 L/min        Palliative Care Assessment & Plan   Goals of Care/Recommendations: DNR/DNI Continue current plan of care, no escalation Family would like to continue to take things day by day given no further decline and no obvious signs of suffering. If this was to occur family is realistic in their expectations and the goal would then be to focus on [name] comfort.  PMT will continue to support and follow as needed.   Thank you for allowing the Palliative Medicine Team to assist in the care of this patient.  Time Total:35 min.    Visit consisted of counseling and education dealing with the complex and emotionally intense issues of symptom management and palliative care in the setting of serious and potentially life-threatening illness.Greater than 50%  of this time was spent counseling and coordinating care related to the above assessment and plan.  Nathan [name], AGPCNP-BC  Palliative Medicine Team [phone]

## 2020-06-09 NOTE — Progress Notes (Signed)
ANTICOAGULATION CONSULT NOTE - Follow Up Consult  Pharmacy Consult for IV heparin  Indication: concern for PE, hx Afib  Allergies  Allergen Reactions  . Tramadol Nausea And Vomiting    Patient Measurements: Height: 5\' 9"  (175.3 cm) Weight: 87 kg (191 lb 12.8 oz) IBW/kg (Calculated) : 70.7 Heparin Dosing Weight: actual body weight   Vital Signs: Temp: 98 F (36.7 C) (02/22 0447) BP: 124/82 (02/22 0447) Pulse Rate: 73 (02/22 0447)  Labs: Recent Labs    06/07/20 0027 06/07/20 0935 06/08/20 0752 06/08/20 0755 06/08/20 1743 06/09/20 0452  HGB 9.9*  --  11.3*  --   --  9.9*  HCT 31.8*  --  34.6*  --   --  31.5*  PLT 158  --  135*  --   --  122*  LABPROT 22.7*  --   --   --   --   --   INR 2.1*  --   --   --   --   --   HEPARINUNFRC <0.10*   < >  --  0.96* 1.42* 1.50*  CREATININE 1.55*  --  1.67*  --   --   --    < > = values in this interval not displayed.    Estimated Creatinine Clearance: 37.2 mL/min (A) (by C-G formula based on SCr of 1.67 mg/dL (H)).   Assessment: 72 y/oM on warfarin PTA for atrial fibrillation admitted on 05/29/2020 with COVID pneumonia. On admission, INR significantly elevated at 8.8. Vitamin K 1mg  IV given on 2/11 and 5mg  IV given on 2/17. Anticoagulation was to be stopped indefinitely due to high risk of fall. Plan was for patient to have right reverse shoulder arthroplasty and hardware removal on 2/18, but INR remained elevated at 3.7, so surgery rescheduled for 2/21. Today, patient with worsening SOB. CXR shows worsened multifocal pneumonia. D-dimer 0.85 >> 8.13. VQ scan ordered. Pharmacy consulted to start heparin infusion due to concern for PE. INR 1.9 today.    06/09/20  Heparin level now continues to be SUPRAtherapeutic despite continuing to decrease the heparin rate - currently on 1150 units/hr  Note per RN on 2/21, that there was concern that the IV site that the heparin was running thru was not good and heparin was not fully getting into  veins so the site was changed this AM and that could be contributing to the elevated level now since patient may now have a more viable IV site. As a result, will assume that the new level is accurate  Hgb 9.9 dropped but same as 2/20 value, PLTC 122 continues to trend down slowly - monitor  INR = 2.1 on 2/20 no INR this AM 2/21 (no warfarin since admission; see above for INR trends and Vit K administration) ==> received Vitamin K 5mg  IV x 1 @ 02:54  No complications of therapy including no bleeding noted per RN  Goal of Therapy:  Heparin level 0.3-0.7 units/ml Monitor platelets by anticoagulation protocol: Yes   Plan:   HOLD IV heparin x 1 hr then decrease heparin infusion from 1150 units/hr to 700 units/hr  Heparin level 8 hours after rate decrease  Daily heparin level, CBC  Monitor closely for s/sx of bleeding    Adrian Saran, PharmD, BCPS [phone] until 3pm 06/09/2020 7:36 AM

## 2020-06-09 NOTE — Progress Notes (Signed)
PROGRESS NOTE  Nathan [name] [id number]  DOB: 10/20/1940  PCP: Janith Lima, MD EOF:[id number]  DOA: 05/29/2020  LOS: 11 days   Chief Complaint  Patient presents with  . Altered Mental Status   Brief narrative: Nathan [name] is a 80 y.o. male with PMH significant for HTN, CKD 3B, chronic A. fib on Coumadin, left ventricle hypertrophy, thoracic aortic aneurysm, BPH, gout who lives at [facility] facility. Patient was brought to the ED on 2/11 for progressively worsening confusion, hypotension and hypoxia. Patient was recently hospitalized from 1/23-1/28 after a fall resulting in a fracture of the right humerus for which he underwent ORIF on 1/25 by Dr. Percell Miller. Of note, patient tested positive for Covid when checked prior to discharge to SNF. Per report, at the facility, for last 2 to 3 days, patient had low oral intake, progressively worsening confusion and also shortness of breath. SNF staff noted his blood pressure to be low at 70/40 and hence EMS was called. EMS noted blood pressure low at 68/40, O2 sat was 80% on room air. He was given 500 cc of normal saline, placed on 4 L oxygen by nasal cannula and brought to the ED.  In the ED, Chest x-ray revealed bilateral patchy infiltrates consistent with Covid pneumonia.   Inflammatory markers were elevated. Patient was admitted for COVID pneumonia.  Patient improved on a standard treatment for Covid pneumonia. Family made a choice not to return him back to SNF but rather take home. He was arranged for discharge today 2/17 with home health and DME's. However a repeat x-ray of right shoulder was done and reviewed by orthopedics. X-ray shows migration of orthopedic hardware with the proximal aspect of the intramedullary rod and the screws no longer seated on the humeral head fracture fragment. Orthopedic recommended repeat surgery. Discharge is held for that reason.  Subjective: Seen and examined.  Initially sleepy but easily  arousable.  Alert after he woke up.  Oriented x2 as usual.  Denied any shortness of breath.  Looked comfortable.  Assessment/Plan: COVID pneumonia Acute respiratory failure with hypoxia   -Presented with worsening shortness of breath, lethargy -COVID test: PCR positive on 1/27 -Chest imaging: Chest x-ray on admission showed new bilateral airspace lung opacities consistent with multifocal pneumonia -Patient completed 5-day course of IV remdesivir, currently on Solu-Medrol 40 mg IV twice daily. He also received baricitinib 1 mg daily for last 1 week which was eventually discontinued. -Oxygen - SpO2: 90 % O2 Flow Rate (L/min): 11 L/min -Supportive care: Vitamin C, Zinc, PRN inhalers, Tylenol, Antitussives (benzonatate/ Mucinex/Tussionex).   -Encouraged incentive spirometry, prone position, out of bed and early mobilization as much as possible -Continue airborne/contact isolation precautions until 06/08/2020 per ID. Patient's inflammatory markers are improving.  Patient suddenly went into respiratory distress requiring high flow as well as nonrebreather yesterday afternoon on 06/06/2020.  Procalcitonin was once again unremarkable. Chest x-ray shows worsened infiltrates.  Cannot rule out superimposed bacterial infection so he was started on Zosyn.  Also high suspicion for PE since his D-dimer jumped 10 times from previously checked but due to elevated creatinine, unable to perform CT angiogram.  Started on IV heparin empirically and ordered VQ scan which is finally done and it is negative for PE so I will stop his heparin drip.  He still has significant rhonchi and still requiring 11 L of high flow oxygen.  We will continue antibiotic.  Have been giving him IV Lasix 40 mg daily for him last  2 days, will repeat dose once again today.  Try to wean oxygen. Recent Labs  Lab 06/03/20 0349 06/05/20 0327 06/06/20 0206 06/06/20 1400 06/06/20 1617 06/06/20 1617 06/06/20 1824 06/07/20 0027 06/07/20 0935  06/07/20 1126 06/07/20 1430 06/07/20 1910 06/08/20 0752 06/09/20 0452  WBC 7.3 12.9* 13.9*  --   --   --   --  21.0*  --   --   --   --  16.3* 18.4*  LATICACIDVEN  --   --   --   --  2.4*   < > 2.0*  --  3.0* 3.3* 2.4* 2.5*  --   --   PROCALCITON  --   --   --   --  0.19  --   --   --   --   --   --   --   --   --   DDIMER 0.85*  --   --  8.13*  --   --   --   --   --   --   --   --   --   --   CRP 2.3*  --   --  0.9  --   --   --   --   --   --   --   --   --   --   ALT 20  --   --   --   --   --   --   --   --   --   --   --   --   --    < > = values in this interval not displayed.   Abnormal urinalysis -Urinalysis with hazy urine, leukocytes and bacteria probably because of dehydration.  AKI on CKD 3B -Creatinine was 1.89 on last discharge, presented with a creatinine elevated 2.29.  Improving with IV fluid, creatinine is down to 1.42 on last check on 2/16..  Encourage oral hydration.  Creatinine back to baseline.  Labs pending today. Recent Labs    05/29/20 1734 05/30/20 0508 05/31/20 0500 06/01/20 0350 06/02/20 0422 06/03/20 0349 06/05/20 0327 06/06/20 0206 06/07/20 0027 06/08/20 0752  BUN 72* 69* 75* 78* 82* 83* 81* 78* 80* 88*  CREATININE 2.29* 2.06* 1.74* 1.53* 1.54* 1.42* 1.45* 1.48* 1.55* 1.67*   Acute hypernatremia -Due to poor oral intake.   -Sodium level improved with half-normal saline. Encourage oral intake.  Currently not on IV fluid.  Labs pending today. Recent Labs  Lab 06/03/20 0349 06/05/20 0327 06/06/20 0206 06/07/20 0027 06/08/20 0752  NA 145 143 142 141 143   Lactic acidosis Non-anion gap metabolic acidosis -Probably secondary to decreased tissue perfusion from dehydration and also anaerobic respiration due to COVID pneumonia.  Now lactic acid worsening.  Unable to provide with IV hydration due to risk of fluid overload.  Acute metabolic encephalopathy Mild chronic cognitive dysfunction -Acute change in mental status because of dehydration,  infection. -Patient also has mild chronic cognitive dysfunction.  He is at his baseline.  Continue to monitor  Essential hypertension -Continue to monitor on Cardizem, metoprolol.   -On last hospitalization, lisinopril was stopped and Lasix was reduced.   -Blood pressure stable at this time.  Elevated INR -INR was elevated for unclear reason.  Gradually improving, 6.2 on last check on 2/16. -For several years, patient was on long-term anticoagulation with Coumadin for A. fib.   -Patient is at significant risk of fall and fatal bleeding. Discussed with family. We will  not resume Coumadin at this time.  INR 2.1 on 06/07/2020 due to, we gave him another vitamin K 5 mg.  Not checking INR on daily basis anymore since plan for surgery has been on hold per family's request. Recent Labs  Lab 06/03/20 0349 06/05/20 0327 06/06/20 0206 06/07/20 0027  INR 6.2* 3.7* 1.9* 2.1*   Afib -On metoprolol and Cardizem.  Rates controlled.  Anticoagulation is stopped indefinitely due to high risk of fall.  Acute on chronic anemia -Baseline hemoglobin above 10.  No active bleeding.  Hemoglobin remains at baseline. -Continue to monitor.   Recent Labs    05/31/20 0500 05/31/20 0737 06/01/20 0350 06/05/20 0327 06/06/20 0206 06/07/20 0027 06/08/20 0752 06/09/20 0452  HGB 9.6*  --    < > 9.3* 9.8* 9.9* 11.3* 9.9*  MCV 99.4  --    < > 93.9 96.1 96.1 92.5 95.2  VITAMINB12  --  492  --   --   --   --   --   --   FOLATE  --  20.0  --   --   --   --   --   --   FERRITIN  --  348*  --   --   --   --   --   --   TIBC  --  142*  --   --   --   --   --   --   IRON  --  51  --   --   --   --   --   --   RETICCTPCT 1.7  --   --   --   --   --   --   --    < > = values in this interval not displayed.   Recent comminuted fracture of right humerus -After mechanical fall. -1/25, underwent ORIF of right humerus by Dr. Percell Miller. -Repeat x-ray obtained on 2/16 which showed migration of the orthopedic hardware. Repeat  surgery recommended by orthopedics.  He was scheduled to have surgery on 06/05/2020 however unfortunately his INR still is 3.5.  Surgery is requesting INR to be 1.5 or less.  Vitamin K would not make any difference today.  I discussed with orthopedic and requested to defer surgery for few hours so we can perhaps provide him with a few units of FFP's in an effort to bring the INR down however I was informed that they will not be able to do surgery later that day and not even on weekend.  They had rescheduled his surgery for past Monday/06/08/2020 however family decided to hold off to his surgery due to his condition.  Surgery signed off.  We will need to ask this question once again from family once patient is more stable from respiratory standpoint and if needed, surgery will need to be called upon.  Impaired mobility -Prior to last admission, patient was using a walker at home. -Discharged to SNF on last admission.  Seen by PT.  Limited mobility because of walker dependence but inability to bear weight on the right upper extremity due to fracture  Mild protein calorie malnutrition Hypoalbuminemia -Albumin level low at 1.9.  Secondary to poor appetite, poor p.o. intake -Nutrition consult  Gout -Continuing home regimen of allopurinol  Goal of care conversation: Had a goal of care conversation with patient's son over the phone  when he went into respiratory distress.  Patient was made DNR/DNI.  We also discussed about possibly transitioning to  comfort care if patient were to decline from here.  Palliative care on board.  Had another discussion with the son again today.  Since patient has been status quo since yesterday, he wants to give him another 1 to 2 days to see where he goes before he makes his decision.  Hyperkalemia: 5.6 yesterday.  Patient received Lasix yesterday and will receive today.  Hopefully this should have corrected hyperkalemia.  Pending BMP today.  Will follow.  Code Status:    Code Status: DNR  Nutritional status: Body mass index is 28.32 kg/m. Nutrition Problem: Inadequate oral intake Etiology: poor appetite Signs/Symptoms: meal completion < 25%,per patient/family report Diet Order            Diet Heart Room service appropriate? Yes; Fluid consistency: Thin  Diet effective now                 DVT prophylaxis: heparin injection 5,000 Units Start: 06/09/20 1400 SCDs Start: 05/29/20 2011   Antimicrobials:  None Fluid: Not on IV fluid Consultants: None Family Communication:  Updated his son over the phone today.  Status is: Inpatient  Remains inpatient appropriate because: Needs repeat surgical fixation of right humerus fracture  Dispo: The patient is from: SNF              Anticipated d/c is to: Family was willing to take home with DMEs, hospital bed. Plan may change post procedure              Anticipated d/c date is: Anticipate 06/11/2020              Patient currently is not medically stable to d/c.   Difficult to place patient No  Infusions:  . ampicillin-sulbactam (UNASYN) IV 3 g (06/09/20 1761)    Scheduled Meds: . acetaminophen  1,000 mg Oral Once  . allopurinol  100 mg Oral Daily  . vitamin C  500 mg Oral Daily  . benzoyl peroxide   Topical BID  . Chlorhexidine Gluconate Cloth  6 each Topical Q0600  . diltiazem  120 mg Oral Daily  . feeding supplement  237 mL Oral TID BM  . finasteride  5 mg Oral Daily  . furosemide  40 mg Intravenous Once  . heparin injection (subcutaneous)  5,000 Units Subcutaneous Q8H  . methylPREDNISolone (SOLU-MEDROL) injection  40 mg Intravenous Q12H  . metoprolol tartrate  50 mg Oral BID  . multivitamin with minerals  1 tablet Oral Daily  . sodium chloride flush  10-40 mL Intracatheter Q12H  . zinc sulfate  220 mg Oral Daily    Antimicrobials: Anti-infectives (From admission, onward)   Start     Dose/Rate Route Frequency Ordered Stop   06/08/20 1445  Ampicillin-Sulbactam (UNASYN) 3 g in sodium chloride  0.9 % 100 mL IVPB        3 g 200 mL/hr over 30 Minutes Intravenous Every 6 hours 06/08/20 1356 06/11/20 0244   06/06/20 1430  piperacillin-tazobactam (ZOSYN) IVPB 3.375 g  Status:  Discontinued        3.375 g 12.5 mL/hr over 240 Minutes Intravenous Every 8 hours 06/06/20 1415 06/08/20 1356   06/05/20 1230  ceFAZolin (ANCEF) IVPB 2g/100 mL premix        2 g 200 mL/hr over 30 Minutes Intravenous On call to O.R. 06/04/20 1341 06/06/20 0559   06/01/20 0900  amoxicillin (AMOXIL) capsule 500 mg  Status:  Discontinued        500 mg Oral Every 8 hours 06/01/20  0350 06/03/20 1544   05/30/20 1000  remdesivir 100 mg in sodium chloride 0.9 % 100 mL IVPB       "Followed by" Linked Group Details   100 mg 200 mL/hr over 30 Minutes Intravenous Daily 05/29/20 2010 06/02/20 1151   05/30/20 0200  cefTRIAXone (ROCEPHIN) 1 g in sodium chloride 0.9 % 100 mL IVPB  Status:  Discontinued        1 g 200 mL/hr over 30 Minutes Intravenous Every 24 hours 05/29/20 2108 06/01/20 0857   05/29/20 2200  remdesivir 200 mg in sodium chloride 0.9% 250 mL IVPB       "Followed by" Linked Group Details   200 mg 580 mL/hr over 30 Minutes Intravenous Once 05/29/20 2010 05/29/20 2317   05/29/20 1845  vancomycin (VANCOREADY) IVPB 1750 mg/350 mL        1,750 mg 175 mL/hr over 120 Minutes Intravenous  Once 05/29/20 1835 05/29/20 2200   05/29/20 1830  vancomycin (VANCOCIN) IVPB 1000 mg/200 mL premix  Status:  Discontinued        1,000 mg 200 mL/hr over 60 Minutes Intravenous  Once 05/29/20 1826 05/29/20 1835   05/29/20 1830  ceFEPIme (MAXIPIME) 2 g in sodium chloride 0.9 % 100 mL IVPB        2 g 200 mL/hr over 30 Minutes Intravenous  Once 05/29/20 1826 05/29/20 1959      PRN meds: acetaminophen, albuterol, diphenhydrAMINE-zinc acetate, guaiFENesin-dextromethorphan, loperamide, melatonin, ondansetron **OR** ondansetron (ZOFRAN) IV, sodium chloride flush   Objective: Vitals:   06/09/20 0447 06/09/20 1124  BP: 124/82 101/67   Pulse: 73 88  Resp: 18 (!) 22  Temp: 98 F (36.7 C) 97.6 F (36.4 C)  SpO2: 92% 90%    Intake/Output Summary (Last 24 hours) at 06/09/2020 1316 Last data filed at 06/09/2020 1217 Gross per 24 hour  Intake 906.97 ml  Output 650 ml  Net 256.97 ml   Filed Weights   05/29/20 1802 05/30/20 0224 06/01/20 0352  Weight: 94.8 kg 83.7 kg 87 kg   Weight change:  Body mass index is 28.32 kg/m.   Physical Exam:  General exam: Appears calm and comfortable  Respiratory system: Bibasilar rhonchi. Respiratory effort normal. Cardiovascular system: S1 & S2 heard, RRR. No JVD, murmurs, rubs, gallops or clicks. No pedal edema. Gastrointestinal system: Abdomen is nondistended, soft and nontender. No organomegaly or masses felt. Normal bowel sounds heard. Central nervous system: Alert and oriented x2. No focal neurological deficits. Extremities: Symmetric 5 x 5 power. Skin: No rashes, lesions or ulcers.  Psychiatry: Judgement and insight appear poor   Data Review: I have personally reviewed the laboratory data and studies available.  Recent Labs  Lab 06/03/20 0349 06/05/20 0327 06/06/20 0206 06/07/20 0027 06/08/20 0752 06/09/20 0452  WBC 7.3 12.9* 13.9* 21.0* 16.3* 18.4*  NEUTROABS 6.8 12.2* 13.2* 19.9*  --   --   HGB 9.1* 9.3* 9.8* 9.9* 11.3* 9.9*  HCT 31.0* 29.4* 32.1* 31.8* 34.6* 31.5*  MCV 98.4 93.9 96.1 96.1 92.5 95.2  PLT 279 270 188 158 135* 122*   Recent Labs  Lab 06/03/20 0349 06/05/20 0327 06/06/20 0206 06/07/20 0027 06/08/20 0752  NA 145 143 142 141 143  K 4.3 4.1 4.5 4.3 5.6*  CL 118* 118* 117* 115* 116*  CO2 16* 16* 16* 16* 18*  GLUCOSE 103* 117* 114* 126* 92  BUN 83* 81* 78* 80* 88*  CREATININE 1.42* 1.45* 1.48* 1.55* 1.67*  CALCIUM 7.6* 7.5* 7.7* 7.6* 7.8*  MG  2.4  --   --   --   --     F/u labs ordered Unresulted Labs (From admission, onward)          Start     Ordered   06/08/20 0500  CBC  Daily,   R     Question:  Specimen collection method   Answer:  Lab=Lab collect   06/06/20 1519         Signed, Darliss Cheney, MD [facility] 06/09/2020

## 2020-06-09 NOTE — Progress Notes (Signed)
   06/09/20 1357  Assess: MEWS Score  Temp 97.6 F (36.4 C)  BP (!) 84/59  Pulse Rate (!) 33  Resp 20  Level of Consciousness Alert  SpO2 93 %  O2 Device HFNC  Patient Activity (if Appropriate) In bed  O2 Flow Rate (L/min) 6 L/min  Assess: MEWS Score  MEWS Temp 0  MEWS Systolic 1  MEWS Pulse 2  MEWS RR 0  MEWS LOC 0  MEWS Score 3  MEWS Score Color Yellow  Assess: if the MEWS score is Yellow or Red  Were vital signs taken at a resting state? Yes  Focused Assessment No change from prior assessment  Early Detection of Sepsis Score *See Row Information* Medium  MEWS guidelines implemented *See Row Information* Yes  Treat  MEWS Interventions Other (Comment) (assessed)  Pain Scale 0-10  Pain Score 0  Take Vital Signs  Increase Vital Sign Frequency  Yellow: Q 2hr X 2 then Q 4hr X 2, if remains yellow, continue Q 4hrs  Escalate  MEWS: Escalate Yellow: discuss with charge nurse/RN and consider discussing with provider and RRT  Notify: Charge Nurse/RN  Name of Charge Nurse/RN Notified English as a second language teacher (consulted with RN)  Date Charge Nurse/RN Notified 06/09/20  Time Charge Nurse/RN Notified 1500  Document  Patient Outcome Not stable and remains on department  Progress note created (see row info) Yes

## 2020-06-10 LAB — CBC
HCT: 31.6 % — ABNORMAL LOW (ref 39.0–52.0)
Hemoglobin: 9.7 g/dL — ABNORMAL LOW (ref 13.0–17.0)
MCH: 30.1 pg (ref 26.0–34.0)
MCHC: 30.7 g/dL (ref 30.0–36.0)
MCV: 98.1 fL (ref 80.0–100.0)
Platelets: 118 10*3/uL — ABNORMAL LOW (ref 150–400)
RBC: 3.22 MIL/uL — ABNORMAL LOW (ref 4.22–5.81)
RDW: 19.1 % — ABNORMAL HIGH (ref 11.5–15.5)
WBC: 23.1 10*3/uL — ABNORMAL HIGH (ref 4.0–10.5)
nRBC: 0.2 % (ref 0.0–0.2)

## 2020-06-10 MED ORDER — IPRATROPIUM-ALBUTEROL 0.5-2.5 (3) MG/3ML IN SOLN: 3.0000 mL | Freq: Four times a day (QID) | RESPIRATORY_TRACT | Status: DC

## 2020-06-10 MED ORDER — HALOPERIDOL LACTATE 2 MG/ML PO CONC
0.5000 mg | ORAL | Status: DC | PRN
  Filled 2020-06-10: qty 0.3

## 2020-06-10 MED ORDER — HALOPERIDOL 0.5 MG PO TABS
0.5000 mg | ORAL_TABLET | ORAL | Status: DC | PRN
  Filled 2020-06-10: qty 1

## 2020-06-10 MED ORDER — MORPHINE SULFATE (CONCENTRATE) 10 MG/0.5ML PO SOLN: 5.0000 mg | ORAL | Status: DC | PRN

## 2020-06-10 MED ORDER — ATROPINE SULFATE 1 % OP SOLN
4.0000 [drp] | OPHTHALMIC | Status: DC | PRN
Start: 2020-06-10 — End: 2020-06-11
  Filled 2020-06-10: qty 2

## 2020-06-10 MED ORDER — FUROSEMIDE 10 MG/ML IJ SOLN
40.0000 mg | Freq: Once | INTRAMUSCULAR | Status: AC
  Administered 2020-06-10: 40 mg via INTRAVENOUS
  Filled 2020-06-10: qty 4

## 2020-06-10 MED ORDER — MORPHINE SULFATE (CONCENTRATE) 10 MG/0.5ML PO SOLN
5.0000 mg | ORAL | Status: DC | PRN
  Administered 2020-06-11: 5 mg via SUBLINGUAL
  Filled 2020-06-10: qty 0.5

## 2020-06-10 MED ORDER — HALOPERIDOL LACTATE 5 MG/ML IJ SOLN: 0.5000 mg | INTRAMUSCULAR | Status: DC | PRN

## 2020-06-10 MED ORDER — ALBUTEROL SULFATE (2.5 MG/3ML) 0.083% IN NEBU: 2.5000 mg | INHALATION_SOLUTION | RESPIRATORY_TRACT | Status: DC | PRN

## 2020-06-10 NOTE — Plan of Care (Signed)

## 2020-06-10 NOTE — Progress Notes (Signed)
PROGRESS NOTE   Nathan [name]  UMP:[id number] DOB: [birth year] DOA: 05/29/2020 PCP: Janith Lima, MD  Brief Narrative:  80 year old white male Atrial fibrillation CHADS2 score >4 Proximal right humeral fracture status post recent ORIF 05/12/2020 HTN, BPH, thoracic aortic aneurysm CKD 3 Asymptomatic Covid +1/27 Discharge recently 05/15/2020 to SNF for shoulder rehab Readmit 05/29/2020 severe hypoxia requiring high flow cannula with bilateral patchy infiltrates INR 8.8 without bleeding and started on broad-spectrum antibiotics Patient was admitted with Covid pneumonia-improved on standard treatment but then 2/17 it was felt he may require repeat surgery on shoulder Unfortunately patient developed again increasing respiratory requirements 2/19 possibly secondary to superimposed bacterial pneumonia-PE was ruled out Family had multiple discussions with the care team palliative care was consulted and it was elected to make him comfort care  Assessment & Plan:   Aspiration superimposed on COVID Acute superimposed on CKD 3 with worsening azotemia Worsening hypernatremia HTN Transaminitis and elevated INR A. fib Recent comminuted right humeral fracture  I spent 25 minutes discussing with family patient's son Harrie Jeans in addition to significant other Mary--healthcare power of attorney Vicente Males was not available however Montine Circle indicated he has discussed with all of the family and they are interested in comfort care  I explained what comfort care may look like in real-time, with de-escalation of multiple modalities of treatment that do not offer longevity and or benefit at end-of-life  We will start to de-escalate aggressive medical care and we will place comfort based orders-I am appreciative to nursing staff for allowing family full visitation rights as is allowed per current policies  If patient survives over the next 48 hours may plan on disposition planning but may not survive through the end  of the week   DVT prophylaxis: SCD Code Status: DNR and comfort care Family Communication: Long discussion as above Disposition:  Status is: Inpatient  Remains inpatient appropriate because:Hemodynamically unstable, Persistent severe electrolyte disturbances, Unsafe d/c plan and IV treatments appropriate due to intensity of illness or inability to take PO   Dispo: The patient is from: Home              Anticipated d/c is to: Unclear at this time              Anticipated d/c date is: 2 days              Patient currently is not medically stable to d/c.   Difficult to place patient No 8  Consultants:   Palliative care  Procedures: None  Antimicrobials: None currently   Subjective: Somewhat confused on high flow nasal cannula Cannot tell me his significant other's name and confuses his son Vicente Males with his brother   Objective: Vitals:   06/10/20 1101 06/10/20 1128 06/10/20 1359 06/10/20 1400  BP:  (!) 89/66 102/67 114/81  Pulse: (!) 58  87 85  Resp:   (!) 25 (!) 22  Temp:    (!) 97.4 F (36.3 C)  TempSrc:    Axillary  SpO2:  (!) 80% 93% 92%  Weight:      Height:        Intake/Output Summary (Last 24 hours) at 06/10/2020 1410 Last data filed at 06/09/2020 2105 Gross per 24 hour  Intake 10 ml  Output --  Net 10 ml   Filed Weights   05/29/20 1802 05/30/20 0224 06/01/20 0352  Weight: 94.8 kg 83.7 kg 87 kg    Examination:  Awake somewhat short of breath no distress EOMI NCAT no  focal deficit Chest clear but poor exam as did not attempt to sit him because of risk of causing pain to right shoulder Right upper extremity in sling Abdomen soft No lower extremity edema Not coherent  Data Reviewed: personally reviewed   CBC    Component Value Date/Time   WBC 23.1 (H) 06/10/2020 0350   RBC 3.22 (L) 06/10/2020 0350   HGB 9.7 (L) 06/10/2020 0350   HCT 31.6 (L) 06/10/2020 0350   PLT 118 (L) 06/10/2020 0350   MCV 98.1 06/10/2020 0350   MCH 30.1 06/10/2020 0350    MCHC 30.7 06/10/2020 0350   RDW 19.1 (H) 06/10/2020 0350   LYMPHSABS 0.1 (L) 06/07/2020 0027   MONOABS 0.8 06/07/2020 0027   EOSABS 0.0 06/07/2020 0027   BASOSABS 0.0 06/07/2020 0027   CMP Latest Ref Rng & Units 06/09/2020 06/08/2020 06/07/2020  Glucose 70 - 99 mg/dL 155(H) 92 126(H)  BUN 8 - 23 mg/dL 112(H) 88(H) 80(H)  Creatinine 0.61 - 1.24 mg/dL 1.83(H) 1.67(H) 1.55(H)  Sodium 135 - 145 mmol/L 150(H) 143 141  Potassium 3.5 - 5.1 mmol/L 4.0 5.6(H) 4.3  Chloride 98 - 111 mmol/L 119(H) 116(H) 115(H)  CO2 22 - 32 mmol/L 17(L) 18(L) 16(L)  Calcium 8.9 - 10.3 mg/dL 7.6(L) 7.8(L) 7.6(L)  Total Protein 6.5 - 8.1 g/dL - - -  Total Bilirubin 0.3 - 1.2 mg/dL - - -  Alkaline Phos 38 - 126 U/L - - -  AST 15 - 41 U/L - - -  ALT 0 - 44 U/L - - -     Radiology Studies: No results found.   Scheduled Meds: . acetaminophen  1,000 mg Oral Once  . allopurinol  100 mg Oral Daily  . vitamin C  500 mg Oral Daily  . benzoyl peroxide   Topical BID  . Chlorhexidine Gluconate Cloth  6 each Topical Q0600  . diltiazem  120 mg Oral Daily  . feeding supplement  237 mL Oral TID BM  . finasteride  5 mg Oral Daily  . heparin injection (subcutaneous)  5,000 Units Subcutaneous Q8H  . methylPREDNISolone (SOLU-MEDROL) injection  40 mg Intravenous Q12H  . metoprolol tartrate  50 mg Oral BID  . multivitamin with minerals  1 tablet Oral Daily  . sodium chloride flush  10-40 mL Intracatheter Q12H  . zinc sulfate  220 mg Oral Daily   Continuous Infusions: . ampicillin-sulbactam (UNASYN) IV 3 g (06/10/20 0811)     LOS: 12 days   Time spent: [place], MD [facility] To contact the attending provider between 7A-7P or the covering provider during after hours 7P-7A, please log into the web site [email or web address] and access using universal [place] password for that web site. If you do not have the password, please call the hospital operator.  06/10/2020, 2:10 PM

## 2020-06-10 NOTE — Progress Notes (Signed)
Physical Therapy Treatment Patient Details Name: Nathan [name] MRN: [id number] DOB: 1940/07/16 Today's Date: 06/10/2020    History of Present Illness Nathan [name] is a 80 y.o. male with PMH significant for history of atrial fibrillation on anticoagulation, hypertension and R humerus fx s/p ORIF 05/12/20.  Pt with positive COVID test at time of dc to rehab and now readmitted 2* increased confusion, hypotension and SOB.    PT Comments    +2 total assist for supine to sit. Pt sat at edge of bed x 4 minutes with min assist. SaO2 80% on 11 L HFNC in sitting, BP 89/66 sitting. Assisted pt with LE AAROM exercises. Family requested pt not be transferred by lift to recliner due to pt fatigue.   Follow Up Recommendations  SNF;Home health PT (per chart, family planning DC home, SNF if family cannot provide 24* care)     Equipment Recommendations  Wheelchair cushion (measurements PT);Wheelchair (measurements PT);Hospital bed;Other (comment) (hoyer lift)    Recommendations for Other Services       Precautions / Restrictions Precautions Type of Shoulder Precautions: -Per ortho PA on 06/03/20:  NWB RUE - sling x 6 weeks (3 more weeks) - can begin pendulums at 4 weeks post-op (1 more week) - no passive/active ROM right shoulder - okay for passive/active ROM right elbow, wrist, hand Shoulder Interventions: Shoulder sling/immobilizer;For comfort Restrictions Weight Bearing Restrictions: Yes RUE Weight Bearing: Non weight bearing Other Position/Activity Restrictions: may bear 5 lbs to elbow.  Restrictions based on notes from 05/15/20 when pt dc to SNF.    Mobility  Bed Mobility Overal bed mobility: Needs Assistance Bed Mobility: Supine to Sit;Sit to Supine     Supine to sit: Total assist;+2 for physical assistance Sit to supine: Total assist;+2 for physical assistance   General bed mobility comments: assist to raise trunk, heavy posterior lean in sitting and SaO2 80% on 11 L O2 sitting with 3/4  dyspnea. Pt sat EOB x 4 minutes with ongoing verbal cues for pursed lip breathing. Sitting tolerance limited by fatigue. Initially pt was able to maintain unsupported sitting, but after ~3 minutes he began to lean to R and required min A for stabilizing trunk. Pt reported mild dizziness in sitting, BP 89/66 in sitting.    Transfers                 General transfer comment: NT- significant other requested pt not be transferred with lift equipment, she felt he needs to rest  Ambulation/Gait                 Stairs             Wheelchair Mobility    Modified Rankin (Stroke Patients Only)       Balance Overall balance assessment: Needs assistance Sitting-balance support: Single extremity supported;Feet supported Sitting balance-Leahy Scale: Poor Sitting balance - Comments: fair initially then poor Postural control: Right lateral lean                                  Cognition Arousal/Alertness: Awake/alert Behavior During Therapy: WFL for tasks assessed/performed Overall Cognitive Status: No family/caregiver present to determine baseline cognitive functioning                                 General Comments: mostly appropriate but confused, oriented to self and to location,  but difficulty following 1 step commands.   Confusion vs HOH. Speech is somewhat slurred.      Exercises General Exercises - Lower Extremity Ankle Circles/Pumps: AROM;Both;5 reps;Supine Heel Slides: AAROM;Both;10 reps;Supine Hip ABduction/ADduction: AAROM;Both;10 reps;Supine    General Comments General comments (skin integrity, edema, etc.): SaO2 88-90% on 11 L HFNC at rest, pt tends to mouth breathe despite ongoing cues for pursed lip breathing. Dropped to 80% on 11 L HFNC in sitting, up to 86% with PLB.      Pertinent Vitals/Pain Faces Pain Scale: No hurt    Home Living                      Prior Function            PT Goals (current  goals can now be found in the care plan section) Acute Rehab PT Goals Patient Stated Goal: To get moving better PT Goal Formulation: Patient unable to participate in goal setting Time For Goal Achievement: 06/14/20 Potential to Achieve Goals: Fair Progress towards PT goals: Progressing toward goals    Frequency    Min 3X/week      PT Plan Current plan remains appropriate    Co-evaluation PT/OT/SLP Co-Evaluation/Treatment: Yes            AM-PAC PT "6 Clicks" Mobility   Outcome Measure  Help needed turning from your back to your side while in a flat bed without using bedrails?: Total Help needed moving from lying on your back to sitting on the side of a flat bed without using bedrails?: Total Help needed moving to and from a bed to a chair (including a wheelchair)?: Total Help needed standing up from a chair using your arms (e.g., wheelchair or bedside chair)?: Total Help needed to walk in hospital room?: Total Help needed climbing 3-5 steps with a railing? : Total 6 Click Score: 6    End of Session Equipment Utilized During Treatment: Oxygen Activity Tolerance: Patient limited by fatigue Patient left: in bed;with call bell/phone within reach;with bed alarm set;with nursing/sitter in room;with family/visitor present Nurse Communication: Mobility status;Need for lift equipment PT Visit Diagnosis: Unsteadiness on feet (R26.81);Other abnormalities of gait and mobility (R26.89);Muscle weakness (generalized) (M62.81)     Time: 1011-1040 PT Time Calculation (min) (ACUTE ONLY): 29 min  Charges:  $Therapeutic Activity: 8-22 mins                     Blondell Reveal Kistler PT 06/10/2020  Acute Rehabilitation Services Pager [phone] Office [phone]

## 2020-06-10 NOTE — Progress Notes (Signed)
Mews Score: Green    06/10/20 0106  Assess: MEWS Score  Temp 98 F (36.7 C)  BP 124/71  Pulse Rate 79  ECG Heart Rate 61  Resp 16  Level of Consciousness Alert  SpO2 92 %  O2 Device HFNC  Patient Activity (if Appropriate) In bed  O2 Flow Rate (L/min) 10 L/min  Assess: MEWS Score  MEWS Temp 0  MEWS Systolic 0  MEWS Pulse 0  MEWS RR 0  MEWS LOC 0  MEWS Score 0  MEWS Score Color Green  Assess: if the MEWS score is Yellow or Red  Were vital signs taken at a resting state? Yes  Focused Assessment No change from prior assessment  Early Detection of Sepsis Score *See Row Information* Medium  MEWS guidelines implemented *See Row Information* No, previously yellow, continue vital signs every 4 hours  Treat  Pain Scale Faces  Faces Pain Scale 0  Document  Patient Outcome Stabilized after interventions  Progress note created (see row info) Yes

## 2020-06-10 NOTE — Progress Notes (Signed)
Occupational Therapy Evaluation  Patient with recent hospitalization due to R shoulder fx, s/p surgery and went to SNF for rehab. Pt has been readmitted with increased confusion and SOB, currently on 11L HFNC. Pt at 89-90% at rest in bed, needing cues for PLB. Difficult to assess UE strength/ROM within precautions as patient is HOH and appears confused, difficult to understand speech sounding slurred at times. Patient needing total A x2 with use of bed pad for all bed mobility, tolerated sitting up to EOB for ~4 mins initially with fair balance then decompensates with fatigue needing increased assistance and R lateral lean. Pt also desat to 80s on 11L, able to increase to 87% with max cues for PLB. Spoke with patient's s/o, unsure of D/C plan at this time however palliative is involved, will continue to follow for [place].    06/10/20 1200  OT Visit Information  Last OT Received On 06/10/20  Assistance Needed +2  PT/OT/SLP Co-Evaluation/Treatment Yes  Reason for Co-Treatment Complexity of the patient's impairments (multi-system involvement);For patient/therapist safety;To address functional/ADL transfers  OT goals addressed during session ADL's and self-care  History of Present Illness Nathan [name] is a 80 y.o. male with PMH significant for history of atrial fibrillation on anticoagulation, hypertension and R humerus fx s/p ORIF 05/12/20.  Pt with positive COVID test at time of dc to rehab and now readmitted 2* increased confusion, hypotension and SOB.  Precautions  Precautions Fall;Shoulder  Type of Shoulder Precautions -Per ortho PA on 06/03/20:  NWB RUE - sling x 6 weeks (3 more weeks) - can begin pendulums at 4 weeks post-op (1 more week) - no passive/active ROM right shoulder - okay for passive/active ROM right elbow, wrist, hand  Shoulder Interventions Shoulder sling/immobilizer;For comfort  Precaution Comments per prior admission 05/12/20, now awaiting clarification from ortho  Restrictions   Weight Bearing Restrictions Yes  Other Position/Activity Restrictions may bear 5 lbs to elbow.  Restrictions based on notes from 05/15/20 when pt dc to SNF.  Home Living  Family/patient expects to be discharged to: [place] Alone  Available Help at Discharge Family;Available PRN/intermittently  Type of Home House  Home Access Stairs to enter  Entrance Stairs-Number of Steps 4-5  Entrance Stairs-Rails Right;Left  Home Layout One level  [place] - 2 wheels;Cane - single point  Prior Function  Level of [place] Needs assistance  Gait / Transfers Assistance Needed Pt reports bed<>chair largely at SNF  Comments Pt at SNF for rehab following R humeral fx  Communication  Communication HOH (better in R ear)  Pain Assessment  Pain Assessment Faces  Faces Pain Scale 2  Pain Location R UE with repositioning  Pain Descriptors / Indicators Grimacing  Pain Intervention(s) Limited activity within patient's tolerance  Cognition  Arousal/Alertness Awake/alert  Behavior During Therapy [facility] for tasks assessed/performed  Overall Cognitive Status No family/caregiver present to determine baseline cognitive functioning  General Comments mostly appropriate but confused, oriented to self and to location, but difficulty following 1 step commands.   Confusion vs HOH. Speech is somewhat slurred.  Upper Extremity Assessment  Upper Extremity Assessment RUE deficits/detail;LUE deficits/detail  RUE Deficits / Details R shoulder not assessed, difficulty following directions possibly d/t HOH  RUE Unable to fully assess due to immobilization;Unable to fully assess due to pain  LUE Deficits / Details AAROM shoulder flexion to approximately 100 degres, functional elbow ROM. repeatedly asked patient to squeeze my fingers with B hands states "okay" but would not perform,  unsure if due to [facility]  Lower Extremity Assessment  Lower Extremity Assessment Defer to PT  evaluation  ADL  Overall ADL's  Needs assistance/impaired  Grooming Minimal assistance;Bed level  Upper Body Bathing Maximal assistance;Bed level  Lower Body Bathing Total assistance;Bed level  Upper Body Dressing  Maximal assistance;Bed level  Lower Body Dressing Total assistance;Bed level  Toilet Transfer Details (indicate cue type and reason) patient's s/o present request we not try to transfer pt at this time, to EOB only  Toileting- Clothing Manipulation and Hygiene Total assistance;Bed level  General ADL Comments patient requiring significant assistance for all self care tasks due to weakness, decreased activity tolerance, increased O2 needs and desat with minimal activity. cue patient in PLB increases from 80 to 87% on 11L HFNC  Bed Mobility  Overal bed mobility Needs Assistance  Bed Mobility Supine to Sit;Sit to Supine  Supine to sit Total assist;+2 for physical assistance  Sit to supine Total assist;+2 for physical assistance  General bed mobility comments assist to raise trunk, heavy posterior lean in sitting and SaO2 80% on 11 L O2 sitting with 3/4 dyspnea. Pt sat EOB x 4 minutes with ongoing verbal cues for pursed lip breathing. Sitting tolerance limited by fatigue. Initially pt was able to maintain unsupported sitting, but after ~3 minutes he began to lean to R and required min A for stabilizing trunk. Pt reported mild dizziness in sitting, BP 89/66 in sitting.  Transfers  General transfer comment NT- significant other requested pt not be transferred with lift equipment, she felt he needs to rest  Balance  Overall balance assessment Needs assistance  Sitting-balance support Single extremity supported;Feet supported  Sitting balance-Leahy Scale Poor  Sitting balance - Comments fair initially then poor  Postural control Right lateral lean  OT - End of Session  Equipment Utilized During Treatment Other (comment);Oxygen (sling)  Activity Tolerance Patient limited by fatigue   Patient left in bed;with call bell/phone within reach;with nursing/sitter in room;with family/visitor present  Nurse Communication Mobility status  OT Assessment  OT Recommendation/Assessment Patient needs continued OT Services  OT Visit Diagnosis Other abnormalities of gait and mobility (R26.89);Muscle weakness (generalized) (M62.81);Pain  Pain - Right/Left Right  Pain - part of body Shoulder  OT Problem List Decreased strength;Decreased range of motion;Decreased activity tolerance;Impaired balance (sitting and/or standing);Decreased cognition;Decreased safety awareness;Decreased knowledge of use of DME or AE;Decreased knowledge of precautions;Pain;Impaired UE functional use  OT Plan  OT Frequency (ACUTE ONLY) Min 2X/week  OT Treatment/Interventions (ACUTE ONLY) Self-care/ADL training;Therapeutic exercise;DME and/or AE instruction;Therapeutic activities;Balance training;Patient/family education  AM-PAC OT "6 Clicks" Daily Activity Outcome Measure (Version 2)  Help from another person eating meals? 2  Help from another person taking care of personal grooming? 2  Help from another person toileting, which includes using toliet, bedpan, or urinal? 1  Help from another person bathing (including washing, rinsing, drying)? 2  Help from another person to put on and taking off regular upper body clothing? 2  Help from another person to put on and taking off regular lower body clothing? 1  6 Click Score 10  OT Recommendation  Follow Up Recommendations SNF;Other (comment) (vs HH and 24/7 A if family declines SNF)  [facility] bed  Individuals Consulted  Consulted and Agree with Results and Recommendations Family member/caregiver  Family Member Consulted s/o  Acute Rehab OT Goals  Patient Stated Goal move a little  OT Goal Formulation With family  Time For Goal Achievement 06/24/20  Potential to Achieve Goals  Fair  OT Time Calculation  OT Start Time (ACUTE ONLY) 1011  OT Stop Time  (ACUTE ONLY) 1037  OT Time Calculation (min) 26 min  OT General Charges  $OT Visit 1 Visit  OT Evaluation  $OT Eval Low Complexity 1 Low  Written Expression  Dominant Hand Right   Delbert Phenix OT OT pager: [phone]

## 2020-06-11 LAB — CBC
HCT: 30.3 % — ABNORMAL LOW (ref 39.0–52.0)
Hemoglobin: 9.3 g/dL — ABNORMAL LOW (ref 13.0–17.0)
MCH: 29.9 pg (ref 26.0–34.0)
MCHC: 30.7 g/dL (ref 30.0–36.0)
MCV: 97.4 fL (ref 80.0–100.0)
Platelets: 113 10*3/uL — ABNORMAL LOW (ref 150–400)
RBC: 3.11 MIL/uL — ABNORMAL LOW (ref 4.22–5.81)
RDW: 19.4 % — ABNORMAL HIGH (ref 11.5–15.5)
WBC: 22.7 10*3/uL — ABNORMAL HIGH (ref 4.0–10.5)
nRBC: 0.1 % (ref 0.0–0.2)

## 2020-06-11 MED ORDER — ALBUTEROL SULFATE (2.5 MG/3ML) 0.083% IN NEBU: 2.5000 mg | INHALATION_SOLUTION | RESPIRATORY_TRACT | 12 refills | Status: AC | PRN

## 2020-06-11 NOTE — TOC Transition Note (Signed)
Transition of Care [facility]) - CM/SW Discharge Note   Patient Details  Name: Nathan [name] MRN: [id number] Date of Birth: 1940-06-02  Transition of Care [facility]) CM/SW Contact:  Trish Mage, LCSW Phone Number: 06/11/2020, 1:33 PM   Clinical Narrative:  Spoke with family at bedside of patient this AM, and they confirmed they wanted referral to [facility] for hospice care.  They also confirmed they were aware that [name] is nearing the end of his life.  All questions answered to the best of my ability.  Chrislyn Edison Pace took the referral, found him appropriate for [employer].  Family initially declined transfer, but after speaking with MD agreed.  [name] will transfer via PTAR today.  PTAR arranged.  Nursing, please call report to  [phone].  TOC sign off.      Final next level of care: [place] Barriers to Discharge: Barriers Resolved   Patient Goals and CMS Choice Patient states their goals for this hospitalization and ongoing recovery are:: to feel better   Choice offered to / list presented to : Adult Children  Discharge Placement                       Discharge Plan and Services                                     Social Determinants of Health (SDOH) Interventions     Readmission Risk Interventions No flowsheet data found.

## 2020-06-11 NOTE — Progress Notes (Addendum)
Manufacturing engineer [facility]) Hospital Liaison note.    Addendum: 9290R: per Al Decant pt's family is still interested in a bed at [facility]. Chart and pt information under review by [facility] physician.  Hospice eligibility pending at this time.   Received request from [place] for family interest in [facility]. This liaison spoke with son Harrie Jeans who reports being just told pt is too far along to transport at this stage.  Sympathies conveyed.  Thank you for the opportunity to participate in this patient's care.  Domenic Moras, BSN, RN [facility] Liaison (listed on [place] under Hospice[facility])    [phone]  (24h on call)

## 2020-06-11 NOTE — Progress Notes (Addendum)
Manufacturing engineer [facility]) Hospital Liaison note.    Addendum: 1:29pm: Consents are complete at this time.  TOC Barbaraann Rondo has been made aware.  Please call report to [phone].  Thank you all for your care of this patient!  Received request from [place] for family interest in [facility]. Chart reviewed and eligibility confirmed. Spoke with family to confirm interest and explain services. Family agreeable to transfer today. TOC aware.    [facility] will notify TOC when registration paperwork has been completed to arrange transport.   RN please call report to [phone]. Please be sure the signed [facility] DNR form transports with the patient.  Thank you for the opportunity to participate in this patient's care.  Domenic Moras, BSN, RN [facility] Liaison (listed on [place] under Hospice[facility])    [phone] (24h on call)

## 2020-06-11 NOTE — TOC Progression Note (Signed)
Transition of Care [facility]) - Progression Note    Patient Details  Name: Nathan [name] MRN: [id number] Date of Birth: 1940-08-02  Transition of Care [facility]) CM/SW [place] Phone Number: 06/11/2020, 10:13 AM  Clinical Narrative:   Went to see family in patient's room after being alerted by MD that they were ready for hospice care.  Met with son Montine Circle and his fiance, and his two sisters.  They all agreed that they are ready for hospice help, would prefer residential hospice, and that they are wanting me to refer to [facility]. Contacted Ms king with [facility] to make referral. TOC will continue to follow during the course of hospitalization.     Expected Discharge Plan: [place] Barriers to Discharge:  (hospice review)  Expected Discharge Plan and Services Expected Discharge Plan: [place] arrangements for the past 2 months: [place]                                       Social Determinants of Health (SDOH) Interventions    Readmission Risk Interventions No flowsheet data found.

## 2020-06-11 NOTE — Progress Notes (Signed)
Report called to [facility] by Amanda Pea. All questions answered.

## 2020-06-11 NOTE — Discharge Summary (Signed)
Physician Discharge Summary  Nathan [name] WEX:[id number] DOB: August 14, 1940 DOA: 05/29/2020  PCP: Janith Lima, MD  Admit date: 05/29/2020 Discharge date: 06/11/2020  Time spent: 37 minutes  Recommendations for Outpatient Follow-up:  1. Patient discharging for end-of-life care to freestanding hospice  Discharge Diagnoses:  MAIN problem for hospitalization   Aspiration pneumonia superimposed on recent coronavirus 19 infection Recent shoulder surgery 05/15/2020 in the setting of medical frailty  Please see below for itemized issues addressed in [place]- refer to other progress notes for clarity if needed  Discharge Condition: Guarded  Diet recommendation: Comfort trajectory feeds  Filed Weights   05/29/20 1802 05/30/20 0224 06/01/20 0352  Weight: 94.8 kg 83.7 kg 87 kg    History of present illness:  80 year old white male Atrial fibrillation CHADS2 score >4 Proximal right humeral fracture status post recent ORIF 05/12/2020 HTN, BPH, thoracic aortic aneurysm CKD 3 Asymptomatic Covid +1/27 Discharge recently 05/15/2020 to SNF for shoulder rehab Readmit 05/29/2020 severe hypoxia requiring high flow cannula with bilateral patchy infiltrates INR 8.8 without bleeding and started on broad-spectrum antibiotics Patient was admitted with Covid pneumonia-improved on standard treatment but then 2/17 it was felt he may require repeat surgery on shoulder Unfortunately patient developed again increasing respiratory requirements 2/19 possibly secondary to superimposed bacterial pneumonia-PE was ruled out   Family had multiple discussions with the care team palliative care was consulted and it was elected to make him comfort care after discussions about his worsening status in addition to pain and suffering that he would sustain and a long recovery with poor likelihood of meaningful recovery-- he had severe acute hypernatremia, acute kidney injury with worsening creatinine and further developing  hyperkalemia and not a candidate for dialysis In addition to poor p.o. intake    He was started on comfort trajectory medications such as Roxanol and was discharged to freestanding hospice  Vitals:   06/11/20 0529 06/11/20 0800  BP: 110/60   Pulse: 83   Resp: (!) 21   Temp: (!) 97.4 F (36.3 C)   SpO2: 93% 90%    Subj on day of d/c   Slightly more awake not coherent Seems like work of breathing is slightly decreased with Roxanol He is unable to verbalize clearly  General Exam on discharge  Cachectic frail white male slight confusion EOMI NCAT S1-S2 no murmur no rub no gallop Abdomen soft no rebound Anasarca Neurologically does follow some commands however   Discharge Instructions   Discharge Instructions    Call MD for:  severe uncontrolled pain   Complete by: As directed    Diet - low sodium heart healthy   Complete by: As directed    Increase activity slowly   Complete by: As directed    No wound care   Complete by: As directed      Allergies as of 06/11/2020      Reactions   Tramadol Nausea And Vomiting      Medication List    STOP taking these medications   acetaminophen 325 MG tablet Commonly known as: TYLENOL   allopurinol 100 MG tablet Commonly known as: ZYLOPRIM   alum & mag hydroxide-simeth 200-200-20 MG/5ML suspension Commonly known as: MAALOX/MYLANTA   bisacodyl 10 MG suppository Commonly known as: DULCOLAX   diltiazem 120 MG 24 hr capsule Commonly known as: CARDIZEM CD   finasteride 5 MG tablet Commonly known as: PROSCAR   furosemide 40 MG tablet Commonly known as: LASIX   levocetirizine 5 MG tablet Commonly known as: XYZAL  lisinopril 40 MG tablet Commonly known as: ZESTRIL   Melatonin 3 MG Caps   metoprolol tartrate 50 MG tablet Commonly known as: LOPRESSOR   Nutritional Supplement Plus Liqd   oxyCODONE-acetaminophen 5-325 MG tablet Commonly known as: Percocet   polyethylene glycol 17 g packet Commonly known  as: MIRALAX / GLYCOLAX   senna-docusate 8.6-50 MG tablet Commonly known as: Senokot-S   warfarin 5 MG tablet Commonly known as: COUMADIN     TAKE these medications   albuterol (2.5 MG/3ML) 0.083% nebulizer solution Commonly known as: PROVENTIL Take 3 mLs (2.5 mg total) by nebulization every 2 (two) hours as needed for wheezing.            Durable Medical Equipment  (From admission, onward)         Start     Ordered   06/03/20 1529  For home use only DME Hospital bed  Once       Question Answer Comment  Length of Need Lifetime   Head must be elevated greater than: 30 degrees   Bed type Semi-electric   Hoyer Lift Yes   Trapeze Bar Yes   Support Surface: Gel Overlay      06/03/20 1529   06/03/20 1243  For home use only DME oxygen  Once       Question Answer Comment  Length of Need Lifetime   Mode or (Route) Nasal cannula   Liters per Minute 4   Frequency Continuous (stationary and portable oxygen unit needed)   Oxygen conserving device Yes   Oxygen delivery system Gas      06/03/20 1242         Allergies  Allergen Reactions  . Tramadol Nausea And Vomiting    Follow-up Information    [name], MD.   Specialty: Orthopedic Surgery Why: To re-check right shoulder Contact information: [address] [place] [address] [place] [address] [phone]        [facility] Follow up.   Specialty: [place] Why: This is the agency that will be providing physical therapy and RN  in the home Contact information: [place] [address] [phone]        [facility] Follow up.   Why: This is your oxygen company, also known as [employer] Contact information: [place] [address] [phone]                The results of significant diagnostics from this hospitalization (including imaging, microbiology, ancillary and laboratory) are listed below for  reference.    Significant Diagnostic Studies: CT Head Wo Contrast  Result Date: 05/29/2020 CLINICAL DATA:  Mental status change.  Short of breath EXAM: CT HEAD WITHOUT CONTRAST TECHNIQUE: Contiguous axial images were obtained from the base of the skull through the vertex without intravenous contrast. COMPARISON:  None. FINDINGS: Brain: No evidence of acute infarction, hemorrhage, hydrocephalus, extra-axial collection or mass lesion/mass effect. Ventricular and sulcal enlargement reflecting moderate diffuse atrophy. Small area of old infarction involving the lateral anterior right frontal lobe. Patchy areas of white matter hypoattenuation noted bilaterally consistent with mild to moderate chronic microvascular ischemic change. Prominent cisterna magna. Vascular: No hyperdense vessel or unexpected calcification. Skull: Normal. Negative for fracture or focal lesion. Sinuses/Orbits: Globes and orbits are unremarkable. Sinuses are clear. Other: None. IMPRESSION: 1. No acute intracranial abnormalities. 2. Moderate atrophy and mild to moderate chronic microvascular ischemic change. Small right anterolateral frontal lobe infarct.  Electronically Signed   By: Lajean Manes M.D.   On: 05/29/2020 18:38   NM Pulmonary Perfusion  Result Date: 06/08/2020 CLINICAL DATA:  Hypoxia.  Recent COVID-19 positive EXAM: NUCLEAR MEDICINE PERFUSION LUNG SCAN TECHNIQUE: Perfusion images were obtained in multiple projections after intravenous injection of radiopharmaceutical. Views: Anterior, posterior, left lateral, right lateral, RPO, LPO, RAO, LAO RADIOPHARMACEUTICALS:  3.7 mCi Tc-73m MAA IV COMPARISON:  Chest radiograph June 06, 2020. FINDINGS: Radiotracer uptake is homogeneous and symmetric bilaterally. No appreciable perfusion defects. IMPRESSION: No appreciable perfusion defects. No findings indicative of pulmonary embolus. Electronically Signed   By: Lowella Grip III M.D.   On: 06/08/2020 14:16   DG CHEST PORT 1  VIEW  Result Date: 06/06/2020 CLINICAL DATA:  Dyspnea. EXAM: PORTABLE CHEST 1 VIEW COMPARISON:  Single-view of the chest 05/29/2020. PA and lateral chest 05/28/2012. CT chest 02/03/2020. FINDINGS: Extensive bilateral airspace disease has worsened since the most recent comparison. Heart size is enlarged. No pneumothorax or pleural fluid. IMPRESSION: Worsened multifocal pneumonia. Electronically Signed   By: Inge Rise M.D.   On: 06/06/2020 13:38   DG Chest Port 1 View  Result Date: 05/29/2020 CLINICAL DATA:  Pt was altered with right arm fracture on last admission 05/10/20. He has been AOx4 since returning to [facility] after 05/10/20 admission. Starting 2/9 pt has become increasingly confused. Today, staff report hypotension 70/40. On scene, EMS reported BP 68/40 and 80% O2 on room air. EXAM: PORTABLE CHEST 1 VIEW COMPARISON:  05/10/2020 FINDINGS: There are bilateral hazy airspace lung opacities, most evident in the right mid lung and throughout most of the left lung. No convincing pleural effusion.  No pneumothorax. Cardiac silhouette is normal in size. ORIF of the proximal right humerus fracture has been performed since the prior study. Fracture appears well aligned and orthopedic hardware well-seated. IMPRESSION: 1. New bilateral airspace lung opacities consistent with multifocal pneumonia. Pattern is compatible with COVID-19 infection. Electronically Signed   By: Lajean Manes M.D.   On: 05/29/2020 18:42   DG Humerus Right  Result Date: 06/03/2020 CLINICAL DATA:  Right humeral fracture repair EXAM: RIGHT HUMERUS - 2+ VIEW COMPARISON:  05/10/2020, 05/12/2020 FINDINGS: Frontal and lateral views of the right humerus demonstrate intramedullary rod with proximal distal interlocking screws traversing a comminuted proximal humeral fracture. There has been migration of the orthopedic hardware since the intraoperative examination. The proximal aspect of the intramedullary rod as well as the proximal  screws do not appear to be seated within the humeral head fracture fragment. There is increased impaction and ventral angulation at the humeral neck fracture site. Persistent soft tissue swelling of the upper arm. IMPRESSION: 1. Migration of the orthopedic hardware, with the proximal aspect of the intramedullary rod as well as the proximal screws no longer seated within the humeral head fracture fragment. Increased impaction and ventral angulation as above. Dedicated views of the right shoulder may be useful to better assess position of the orthopedic hardware. Electronically Signed   By: Randa Ngo M.D.   On: 06/03/2020 20:23   DG C-Arm 1-60 Min-No Report  Result Date: 05/12/2020 Fluoroscopy was utilized by the requesting physician.  No radiographic interpretation.    Microbiology: No results found for this or any previous visit (from the past 240 hour(s)).   Labs: Basic Metabolic Panel: Recent Labs  Lab 06/05/20 0327 06/06/20 0206 06/07/20 0027 06/08/20 0752 06/09/20 1335  NA 143 142 141 143 150*  K 4.1 4.5 4.3 5.6* 4.0  CL 118* 117* 115*  116* 119*  CO2 16* 16* 16* 18* 17*  GLUCOSE 117* 114* 126* 92 155*  BUN 81* 78* 80* 88* 112*  CREATININE 1.45* 1.48* 1.55* 1.67* 1.83*  CALCIUM 7.5* 7.7* 7.6* 7.8* 7.6*   Liver Function Tests: No results for input(s): AST, ALT, ALKPHOS, BILITOT, PROT, ALBUMIN in the last 168 hours. No results for input(s): LIPASE, AMYLASE in the last 168 hours. No results for input(s): AMMONIA in the last 168 hours. CBC: Recent Labs  Lab 06/05/20 0327 06/06/20 0206 06/07/20 0027 06/08/20 0752 06/09/20 0452 06/10/20 0350 06/11/20 0354  WBC 12.9* 13.9* 21.0* 16.3* 18.4* 23.1* 22.7*  NEUTROABS 12.2* 13.2* 19.9*  --   --   --   --   HGB 9.3* 9.8* 9.9* 11.3* 9.9* 9.7* 9.3*  HCT 29.4* 32.1* 31.8* 34.6* 31.5* 31.6* 30.3*  MCV 93.9 96.1 96.1 92.5 95.2 98.1 97.4  PLT 270 188 158 135* 122* 118* 113*   Cardiac Enzymes: No results for input(s): CKTOTAL,  CKMB, CKMBINDEX, TROPONINI in the last 168 hours. BNP: BNP (last 3 results) Recent Labs    05/29/20 1734  BNP 136.0*    ProBNP (last 3 results) No results for input(s): PROBNP in the last 8760 hours.  CBG: No results for input(s): GLUCAP in the last 168 hours.   Signed:  Nita Sells MD   [facility] 06/11/2020, 11:35 AM

## 2020-06-13 ENCOUNTER — Other Ambulatory Visit: Payer: Self-pay | Admitting: Internal Medicine

## 2020-06-13 DIAGNOSIS — L2084 Intrinsic (allergic) eczema: Secondary | ICD-10-CM

## 2020-06-16 ENCOUNTER — Other Ambulatory Visit: Payer: Self-pay | Admitting: Internal Medicine

## 2020-06-16 DIAGNOSIS — L2084 Intrinsic (allergic) eczema: Secondary | ICD-10-CM

## 2020-06-16 DEATH — deceased

## 2020-06-21 ENCOUNTER — Other Ambulatory Visit: Payer: Self-pay | Admitting: Internal Medicine

## 2020-06-21 DIAGNOSIS — M1000 Idiopathic gout, unspecified site: Secondary | ICD-10-CM

## 2020-07-08 ENCOUNTER — Ambulatory Visit: Payer: Medicare Other | Admitting: Physician Assistant

## 2020-12-25 ENCOUNTER — Other Ambulatory Visit: Payer: Self-pay | Admitting: Thoracic Surgery (Cardiothoracic Vascular Surgery)

## 2020-12-25 DIAGNOSIS — I712 Thoracic aortic aneurysm, without rupture, unspecified: Secondary | ICD-10-CM

## 2021-02-11 ENCOUNTER — Other Ambulatory Visit: Payer: Medicare Other

## 2021-02-15 ENCOUNTER — Ambulatory Visit: Payer: Medicare Other

## 2021-12-02 IMAGING — DX DG HUMERUS 2V *R*
2 series · 2 of 2 positions shown · non-contrast
Comparison: 05/10/2020, 05/12/2020

CLINICAL DATA: Right humeral fracture repair

EXAM:
RIGHT HUMERUS - 2+ VIEW

[humerus ap]
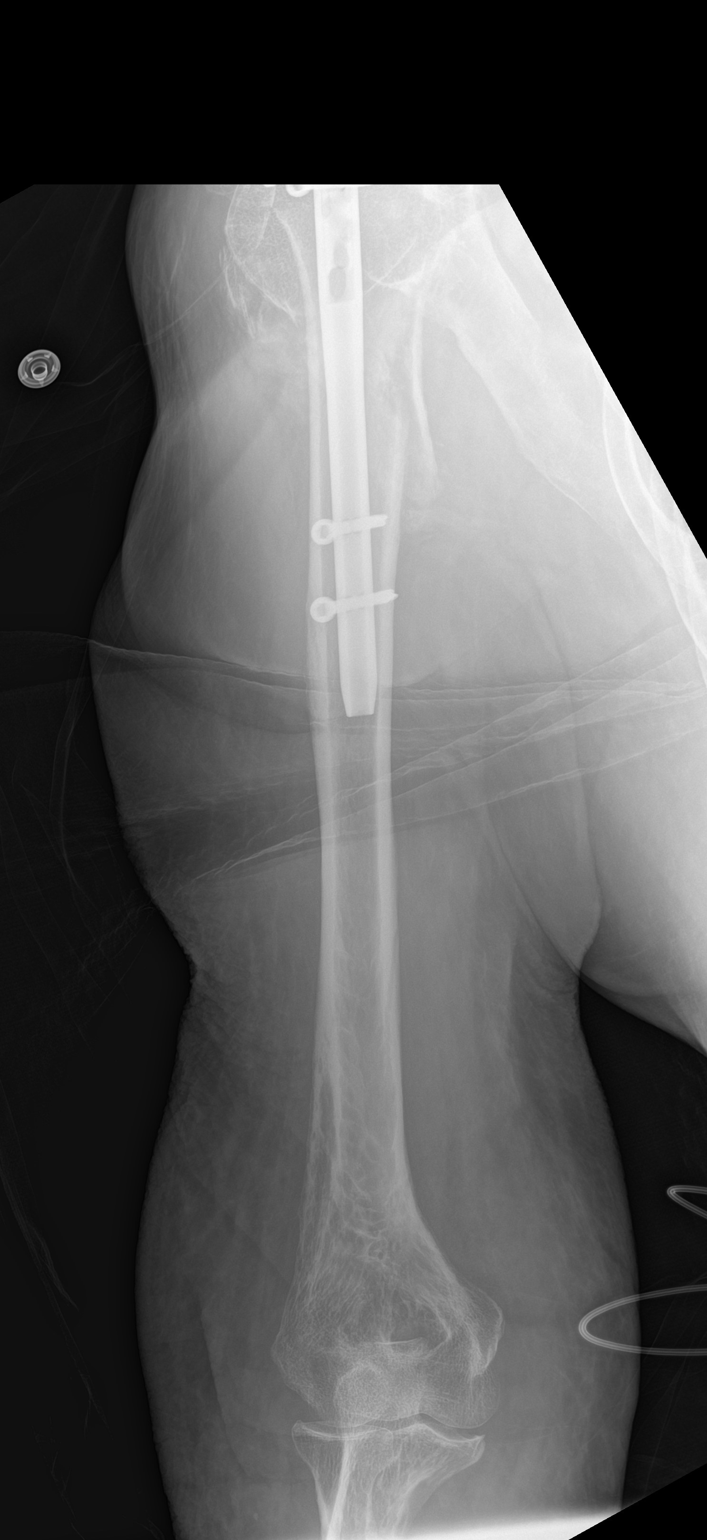

[humerus lat]
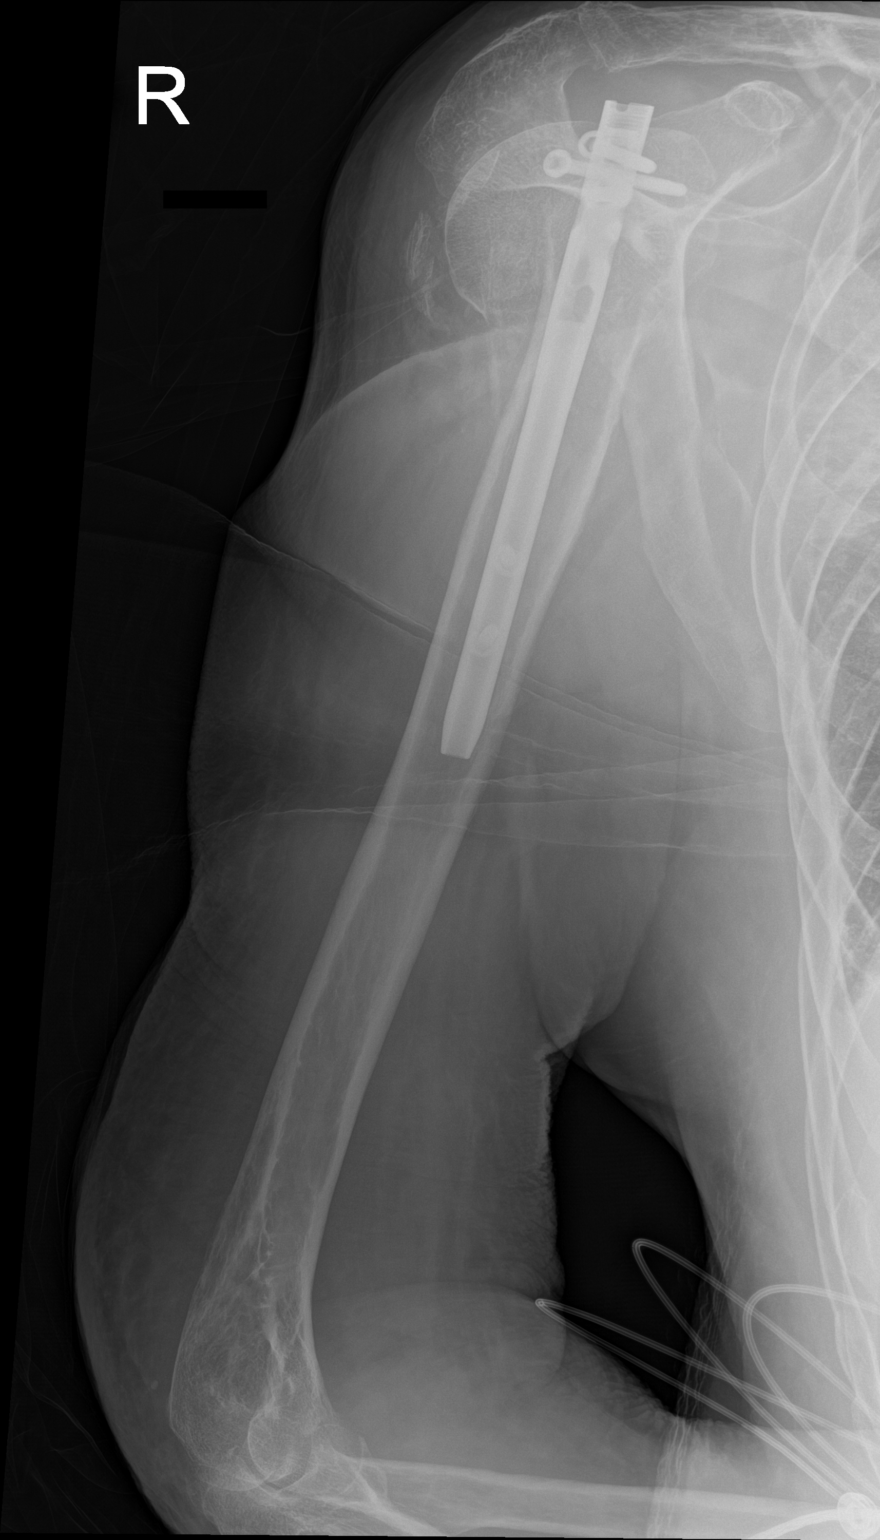

[2 of 2 positions shown; findings below may reference images not displayed]

FINDINGS: Frontal and lateral views of the right humerus demonstrate
intramedullary rod with proximal distal interlocking screws
traversing a comminuted proximal humeral fracture. There has been
migration of the orthopedic hardware since the intraoperative
examination. The proximal aspect of the intramedullary rod as well
as the proximal screws do not appear to be seated within the humeral
head fracture fragment. There is increased impaction and ventral
angulation at the humeral neck fracture site. Persistent soft tissue
swelling of the upper arm.
IMPRESSION: 1. Migration of the orthopedic hardware, with the proximal aspect of
the intramedullary rod as well as the proximal screws no longer
seated within the humeral head fracture fragment. Increased
impaction and ventral angulation as above. Dedicated views of the
right shoulder may be useful to better assess position of the
orthopedic hardware.

## 2021-12-05 IMAGING — DX DG CHEST 1V PORT
2 series · 2 of 2 positions shown · non-contrast
Comparison: Single-view of the chest 05/29/2020. PA and lateral
chest 05/28/2012. CT chest 02/03/2020.

CLINICAL DATA: Dyspnea.

EXAM:
PORTABLE CHEST 1 VIEW

[chest ap (1 of 2)]
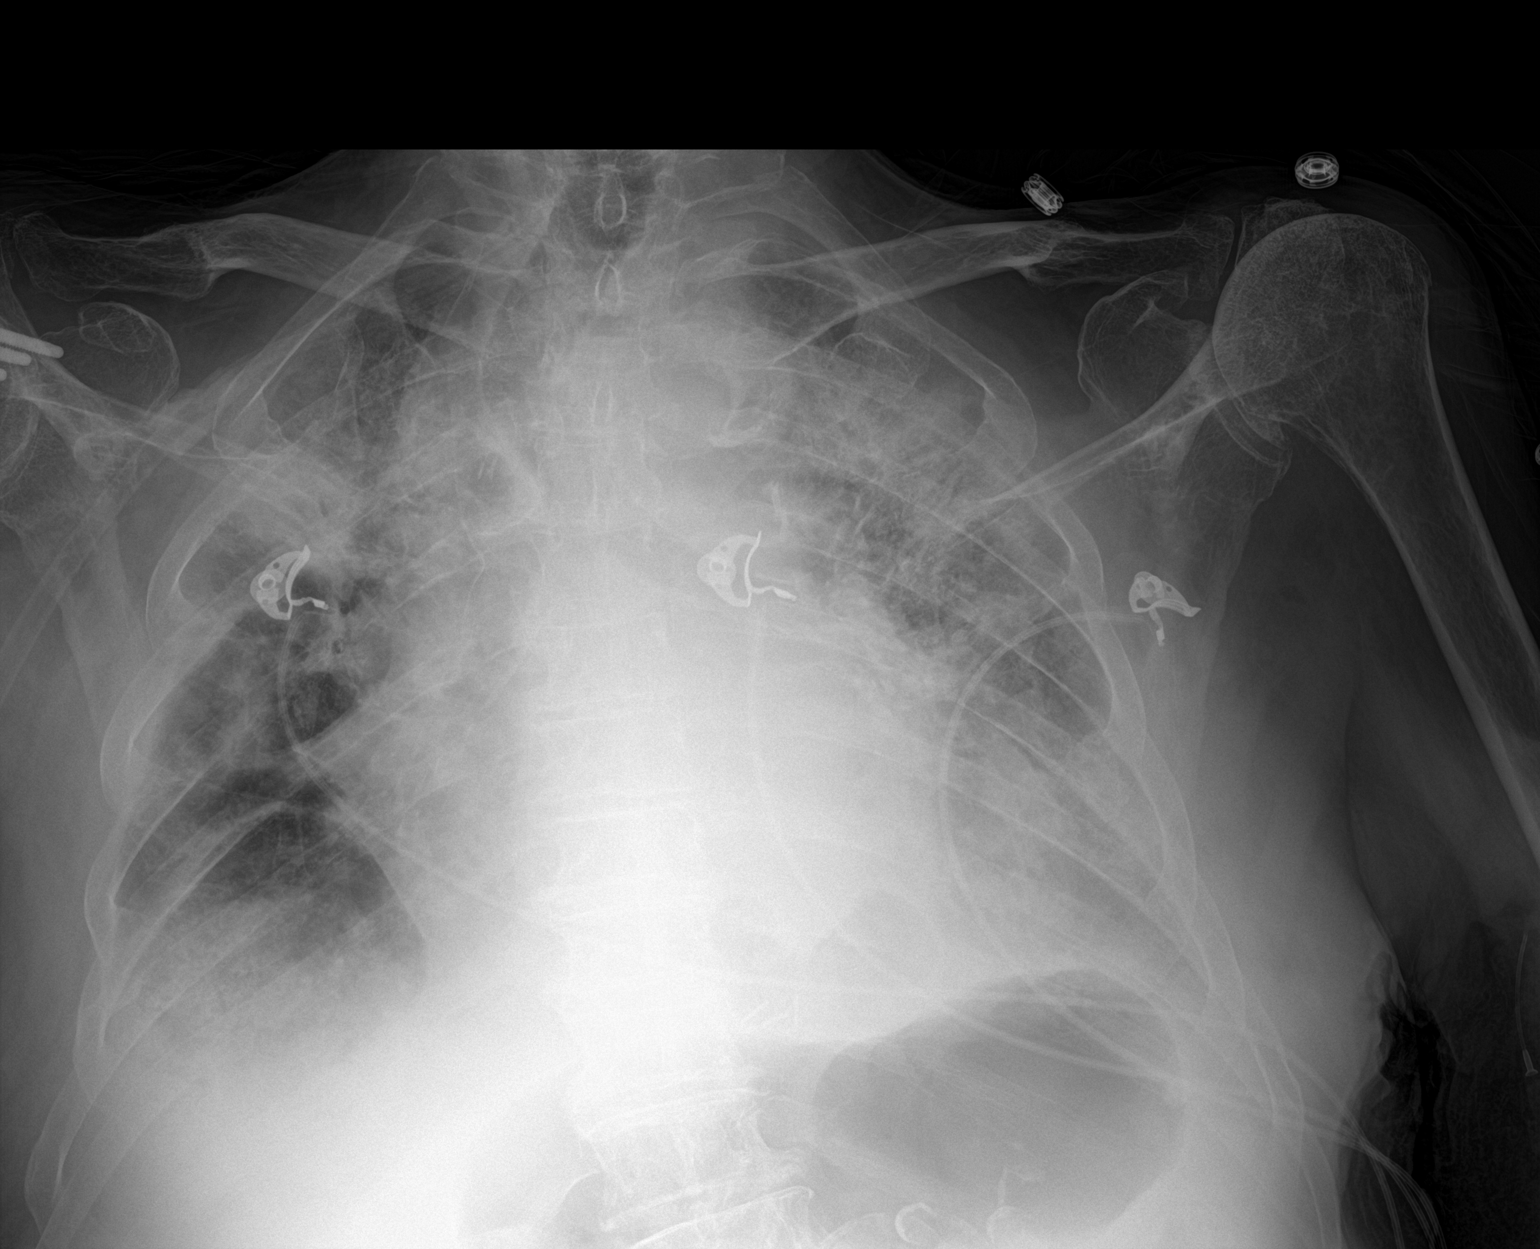

[chest ap (2 of 2)]
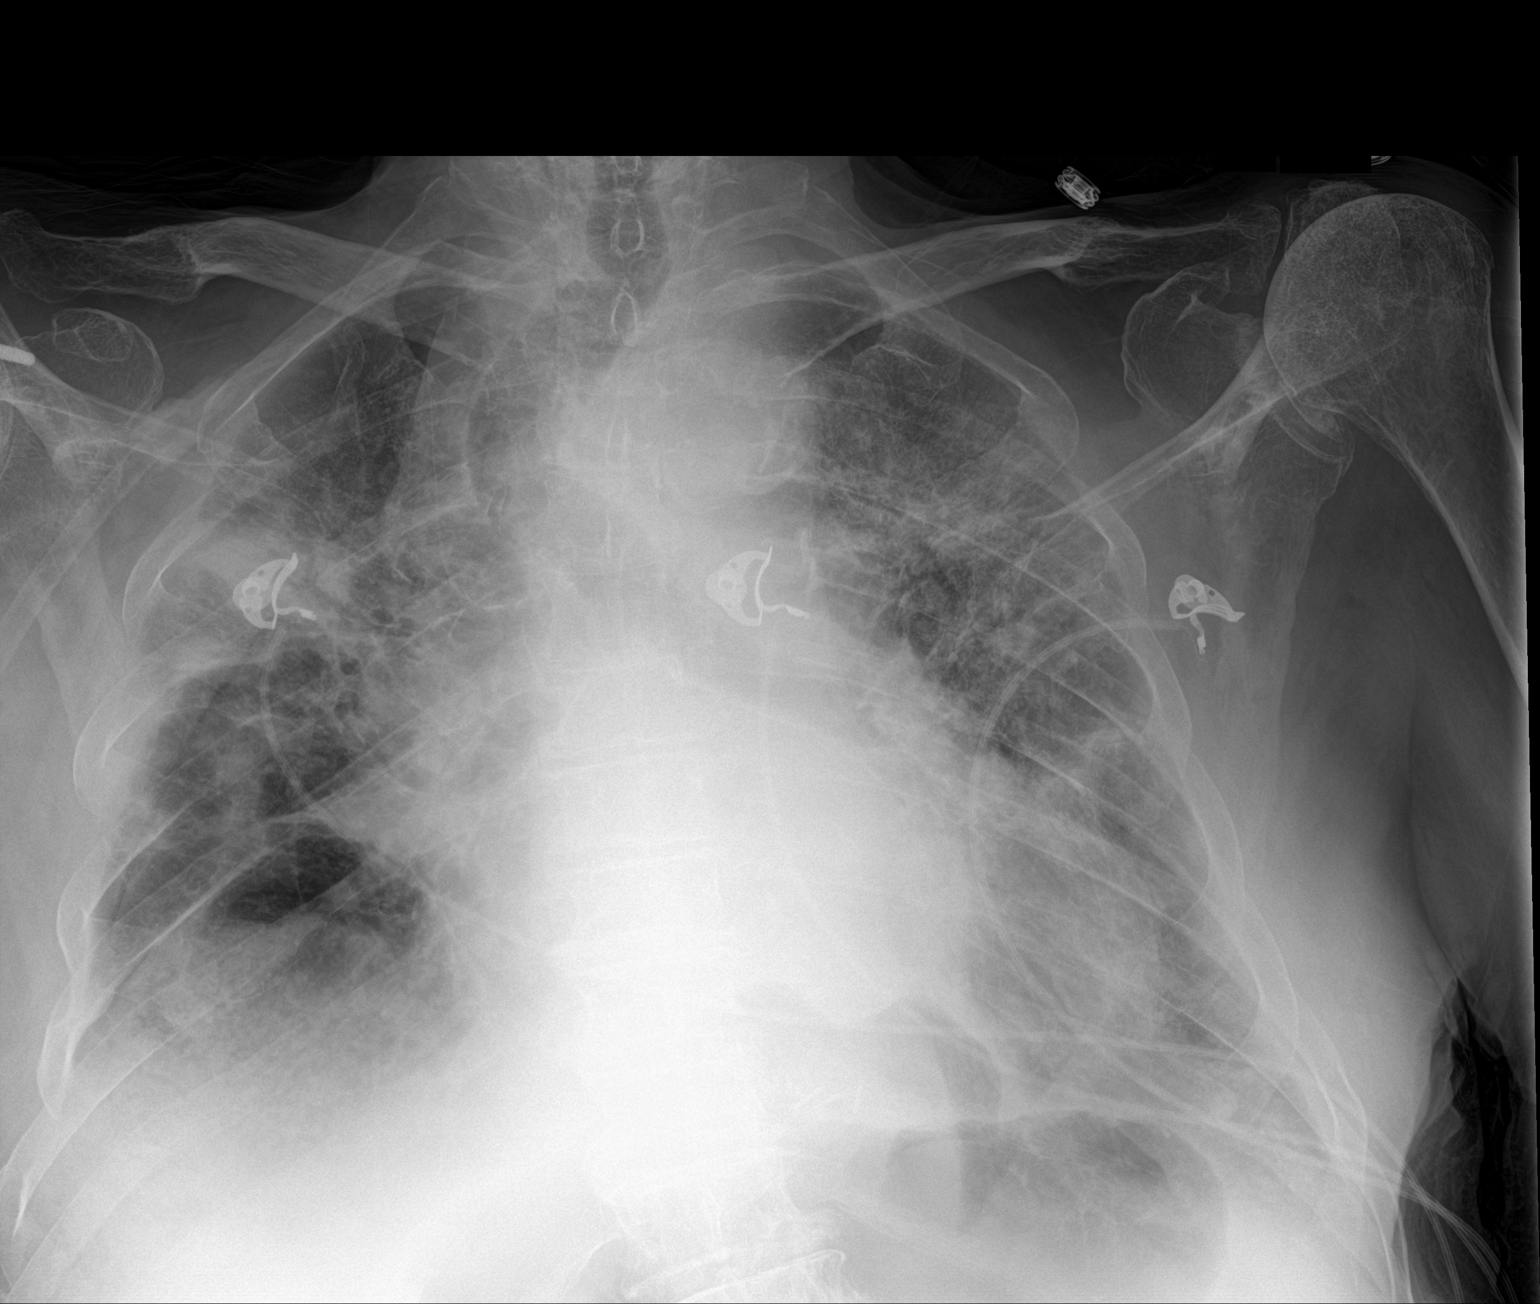

[2 of 2 positions shown; findings below may reference images not displayed]

FINDINGS: Extensive bilateral airspace disease has worsened since the most
recent comparison. Heart size is enlarged. No pneumothorax or
pleural fluid.
IMPRESSION: Worsened multifocal pneumonia.
